# Patient Record
Sex: Female | Born: 1935 | Race: White | Hispanic: No | Marital: Married | Smoking: Never smoker
Health system: Southern US, Community
[De-identification: ages and names within clinical notes are randomized; demographics above are authoritative.]

## PROBLEM LIST (undated history)

## (undated) DIAGNOSIS — J189 Pneumonia, unspecified organism: Secondary | ICD-10-CM

## (undated) DIAGNOSIS — F419 Anxiety disorder, unspecified: Secondary | ICD-10-CM

## (undated) DIAGNOSIS — D649 Anemia, unspecified: Secondary | ICD-10-CM

## (undated) DIAGNOSIS — H3530 Unspecified macular degeneration: Secondary | ICD-10-CM

## (undated) DIAGNOSIS — Z2239 Carrier of other specified bacterial diseases: Secondary | ICD-10-CM

## (undated) DIAGNOSIS — R600 Localized edema: Secondary | ICD-10-CM

## (undated) DIAGNOSIS — H35039 Hypertensive retinopathy, unspecified eye: Secondary | ICD-10-CM

## (undated) DIAGNOSIS — Z9889 Other specified postprocedural states: Secondary | ICD-10-CM

## (undated) DIAGNOSIS — C801 Malignant (primary) neoplasm, unspecified: Secondary | ICD-10-CM

## (undated) DIAGNOSIS — M419 Scoliosis, unspecified: Secondary | ICD-10-CM

## (undated) DIAGNOSIS — I10 Essential (primary) hypertension: Secondary | ICD-10-CM

## (undated) DIAGNOSIS — M1990 Unspecified osteoarthritis, unspecified site: Secondary | ICD-10-CM

## (undated) DIAGNOSIS — R32 Unspecified urinary incontinence: Secondary | ICD-10-CM

## (undated) DIAGNOSIS — R112 Nausea with vomiting, unspecified: Secondary | ICD-10-CM

## (undated) HISTORY — PX: ABDOMINAL SURGERY: SHX537

## (undated) HISTORY — DX: Unspecified macular degeneration: H35.30

## (undated) HISTORY — DX: Hypertensive retinopathy, unspecified eye: H35.039

## (undated) HISTORY — PX: JOINT REPLACEMENT: SHX530

## (undated) HISTORY — PX: ABDOMINAL HYSTERECTOMY: SHX81

## (undated) HISTORY — PX: TONSILLECTOMY: SUR1361

## (undated) HISTORY — PX: EYE SURGERY: SHX253

## (undated) HISTORY — PX: CATARACT EXTRACTION: SUR2

## (undated) HISTORY — PX: BACK SURGERY: SHX140

## (undated) HISTORY — PX: TUBAL LIGATION: SHX77

---

## 2010-01-17 ENCOUNTER — Encounter
Admission: RE | Admit: 2010-01-17 | Discharge: 2010-01-17 | Payer: Self-pay | Source: Home / Self Care | Attending: Orthopedic Surgery | Admitting: Orthopedic Surgery

## 2010-03-16 ENCOUNTER — Ambulatory Visit (HOSPITAL_COMMUNITY)
Admission: RE | Admit: 2010-03-16 | Discharge: 2010-03-16 | Disposition: A | Discharge status: Home / Self Care | Payer: Medicare Other | Source: Ambulatory Visit | Attending: Orthopedic Surgery | Admitting: Orthopedic Surgery

## 2010-03-16 ENCOUNTER — Other Ambulatory Visit (HOSPITAL_COMMUNITY): Payer: Self-pay | Admitting: Orthopedic Surgery

## 2010-03-16 ENCOUNTER — Encounter (HOSPITAL_COMMUNITY)
Admission: RE | Admit: 2010-03-16 | Discharge: 2010-03-16 | Disposition: A | Discharge status: Home / Self Care | Payer: Medicare Other | Source: Ambulatory Visit | Attending: Orthopedic Surgery | Admitting: Orthopedic Surgery

## 2010-03-16 DIAGNOSIS — Z01818 Encounter for other preprocedural examination: Secondary | ICD-10-CM | POA: Insufficient documentation

## 2010-03-16 DIAGNOSIS — Z01810 Encounter for preprocedural cardiovascular examination: Secondary | ICD-10-CM | POA: Insufficient documentation

## 2010-03-16 DIAGNOSIS — Z01812 Encounter for preprocedural laboratory examination: Secondary | ICD-10-CM | POA: Insufficient documentation

## 2010-03-16 LAB — APTT: aPTT: 26 seconds (ref 24–37)

## 2010-03-16 LAB — CBC
Platelets: 223 10*3/uL (ref 150–400)
RBC: 4.2 MIL/uL (ref 3.87–5.11)
WBC: 9 10*3/uL (ref 4.0–10.5)

## 2010-03-16 LAB — URINALYSIS, ROUTINE W REFLEX MICROSCOPIC
Ketones, ur: NEGATIVE mg/dL
Nitrite: NEGATIVE
Protein, ur: NEGATIVE mg/dL

## 2010-03-16 LAB — DIFFERENTIAL
Basophils Absolute: 0 10*3/uL (ref 0.0–0.1)
Basophils Relative: 0 % (ref 0–1)
Eosinophils Absolute: 0.2 10*3/uL (ref 0.0–0.7)
Lymphs Abs: 3 10*3/uL (ref 0.7–4.0)
Neutrophils Relative %: 56 % (ref 43–77)

## 2010-03-16 LAB — PROTIME-INR
INR: 0.97 (ref 0.00–1.49)
Prothrombin Time: 13.1 seconds (ref 11.6–15.2)

## 2010-03-16 LAB — COMPREHENSIVE METABOLIC PANEL
Albumin: 4.1 g/dL (ref 3.5–5.2)
BUN: 20 mg/dL (ref 6–23)
Creatinine, Ser: 1.02 mg/dL (ref 0.4–1.2)
Glucose, Bld: 100 mg/dL — ABNORMAL HIGH (ref 70–99)
Total Bilirubin: 0.7 mg/dL (ref 0.3–1.2)
Total Protein: 6.7 g/dL (ref 6.0–8.3)

## 2010-03-16 LAB — SURGICAL PCR SCREEN: MRSA, PCR: NEGATIVE

## 2010-03-17 LAB — URINE CULTURE
Culture  Setup Time: 201203011205
Culture: NO GROWTH

## 2010-03-20 ENCOUNTER — Inpatient Hospital Stay (HOSPITAL_COMMUNITY)
Admission: RE | Admit: 2010-03-20 | Discharge: 2010-03-22 | DRG: 470 | Disposition: A | Discharge status: Home Health | Payer: Medicare Other | Source: Ambulatory Visit | Attending: Orthopedic Surgery | Admitting: Orthopedic Surgery

## 2010-03-20 DIAGNOSIS — M169 Osteoarthritis of hip, unspecified: Principal | ICD-10-CM | POA: Diagnosis present

## 2010-03-20 DIAGNOSIS — D62 Acute posthemorrhagic anemia: Secondary | ICD-10-CM | POA: Diagnosis not present

## 2010-03-20 DIAGNOSIS — I10 Essential (primary) hypertension: Secondary | ICD-10-CM | POA: Diagnosis present

## 2010-03-20 DIAGNOSIS — M1610 Unilateral primary osteoarthritis, unspecified hip: Principal | ICD-10-CM | POA: Diagnosis present

## 2010-03-20 LAB — CBC
HCT: 28.4 % — ABNORMAL LOW (ref 36.0–46.0)
MCHC: 34.5 g/dL (ref 30.0–36.0)
Platelets: 179 10*3/uL (ref 150–400)
RDW: 12.5 % (ref 11.5–15.5)
WBC: 12.1 10*3/uL — ABNORMAL HIGH (ref 4.0–10.5)

## 2010-03-21 LAB — BASIC METABOLIC PANEL
Calcium: 8.3 mg/dL — ABNORMAL LOW (ref 8.4–10.5)
GFR calc Af Amer: >60 mL/min (ref >60)
GFR calc non Af Amer: 56 mL/min — ABNORMAL LOW (ref >60)
Glucose, Bld: 138 mg/dL — ABNORMAL HIGH (ref 70–99)
Potassium: 3.7 mEq/L (ref 3.5–5.1)
Sodium: 136 mEq/L (ref 135–145)

## 2010-03-21 LAB — PROTIME-INR
INR: 1.16 (ref 0.00–1.49)
Prothrombin Time: 15 seconds (ref 11.6–15.2)

## 2010-03-21 LAB — CBC
Hemoglobin: 7.9 g/dL — ABNORMAL LOW (ref 12.0–15.0)
Platelets: 159 10*3/uL (ref 150–400)
RBC: 2.52 MIL/uL — ABNORMAL LOW (ref 3.87–5.11)
WBC: 7.8 10*3/uL (ref 4.0–10.5)

## 2010-03-22 LAB — CBC
HCT: 25.9 % — ABNORMAL LOW (ref 36.0–46.0)
Hemoglobin: 8.9 g/dL — ABNORMAL LOW (ref 12.0–15.0)
MCHC: 34.4 g/dL (ref 30.0–36.0)
WBC: 10.9 10*3/uL — ABNORMAL HIGH (ref 4.0–10.5)

## 2010-03-22 LAB — BASIC METABOLIC PANEL
CO2: 26 mEq/L (ref 19–32)
Calcium: 8 mg/dL — ABNORMAL LOW (ref 8.4–10.5)
GFR calc Af Amer: 58 mL/min — ABNORMAL LOW (ref >60)
GFR calc non Af Amer: 48 mL/min — ABNORMAL LOW (ref >60)
Glucose, Bld: 146 mg/dL — ABNORMAL HIGH (ref 70–99)
Potassium: 3.3 mEq/L — ABNORMAL LOW (ref 3.5–5.1)
Sodium: 136 mEq/L (ref 135–145)

## 2010-03-22 LAB — CROSSMATCH
ABO/RH(D): B POS
Antibody Screen: NEGATIVE
Unit division: 0
Unit division: 0

## 2010-03-22 LAB — PROTIME-INR: INR: 1.2 (ref 0.00–1.49)

## 2010-03-29 NOTE — Op Note (Signed)
  NAMESUANN, KLIER                ACCOUNT NO.:  [id number]  MEDICAL RECORD NO.:  [id number]           PATIENT TYPE:  I  LOCATION:  5002                         FACILITY:  [facility]  PHYSICIAN:  Mila Homer. Sherlean Foot, M.D. DATE OF BIRTH:  12-23-1934  DATE OF PROCEDURE:  03/20/2010 DATE OF DISCHARGE:  03/22/2010                              OPERATIVE REPORT   SURGEON:  Mila Homer. Sherlean Foot, MD  ASSISTANT:  Altamese Cabal, PA-C  PREOPERATIVE DIAGNOSIS:  Left hip osteoarthritis.  POSTOPERATIVE DIAGNOSIS:  Left hip osteoarthritis.  PROCEDURE:  Left total hip arthroplasty.  INDICATION FOR PROCEDURE:  The patient is a 76 year old white female with failure to conservative measures for osteoarthritis of the left hip.  Informed consent was obtained.  DESCRIPTION OF PROCEDURE:  The patient was laid supine, administered general anesthesia.  The left hip was prepped and draped in the usual sterile fashion.  A curvilinear incision was made over the greater trochanter approximately 6 inches in length.  I used the cautery to dissect down two and through the fascia lata using Charnley retractor and keep that in place.  I then incised the anterior one half of the gluteus medius, all the minimum and took that up anteriorly in a single sleeve and vastus lateralis, tagged with three stay sutures.  I then performed an anterior hip capsulectomy.  I then made my neck cut with reciprocating saw marking an out with the neck cutting guide.  I then removed the head and neck segment.  I placed a Hohmann retractor anteriorly and posteriorly and removed that circumferentially.  I then placed the leg in a sterile pouch, also went to the back side of the table and reamed up to 52 because of the bone was not very good and there was a lot of wires superiorly as head has been perched.  I excised with the use of 50-port trabecular metal cuff with two screws 20 and 25 mm.  I placed a standard liner and received 32 head,  went to the back side of the table.  I exposed the cut surface of the femoral neck and reamed up to 13, broached with a 13 trial with various head sizes and a +7 x 32 worked well.  I removed the trial components and copiously irrigated.  I then tamped on a fully porous coated size 13 stem and placed onto that to a 32 x 7+ head onto the [facility] taper, located the hip, took it through an aggressive range of motion, and it was very, very stable.  I then irrigated and closed the lateralis, medius, minimus sleeves through drill holes on the trochanter.  I closed the fascia lata with running #1 Vicryl, buried 0 Vicryl, subcuticular 2-0 Vicryl and skin staples.  Dressed with Xeroform and a Mepilex dressing.  COMPLICATIONS:  None.  DRAINS:  None.  EBL:  300 mL.          ______________________________ Mila Homer. Sherlean Foot, M.D.     SDL/MEDQ  D:  03/27/2010  T:  03/28/2010  Job:  [id number]  Electronically Signed by Georgena Spurling M.D. on 03/29/2010 11:29:13 AM

## 2011-06-21 ENCOUNTER — Other Ambulatory Visit: Payer: Self-pay | Admitting: Neurosurgery

## 2011-06-21 DIAGNOSIS — IMO0002 Reserved for concepts with insufficient information to code with codable children: Secondary | ICD-10-CM

## 2011-06-21 DIAGNOSIS — M546 Pain in thoracic spine: Secondary | ICD-10-CM

## 2011-06-21 DIAGNOSIS — M545 Low back pain: Secondary | ICD-10-CM

## 2011-06-21 DIAGNOSIS — Q762 Congenital spondylolisthesis: Secondary | ICD-10-CM

## 2011-06-27 ENCOUNTER — Ambulatory Visit
Admission: RE | Admit: 2011-06-27 | Discharge: 2011-06-27 | Disposition: A | Discharge status: Home / Self Care | Payer: Medicare Other | Source: Ambulatory Visit | Attending: Neurosurgery | Admitting: Neurosurgery

## 2011-06-27 DIAGNOSIS — M546 Pain in thoracic spine: Secondary | ICD-10-CM

## 2011-06-27 DIAGNOSIS — IMO0002 Reserved for concepts with insufficient information to code with codable children: Secondary | ICD-10-CM

## 2011-06-27 DIAGNOSIS — M545 Low back pain, unspecified: Secondary | ICD-10-CM

## 2011-06-27 DIAGNOSIS — Q762 Congenital spondylolisthesis: Secondary | ICD-10-CM

## 2011-07-06 ENCOUNTER — Other Ambulatory Visit: Payer: Self-pay | Admitting: Neurosurgery

## 2011-07-17 ENCOUNTER — Encounter (HOSPITAL_COMMUNITY): Payer: Self-pay | Admitting: Pharmacy Technician

## 2011-07-24 ENCOUNTER — Encounter (HOSPITAL_COMMUNITY)
Admission: RE | Admit: 2011-07-24 | Discharge: 2011-07-24 | Disposition: A | Discharge status: Home / Self Care | Payer: Medicare Other | Source: Ambulatory Visit | Attending: Neurosurgery | Admitting: Neurosurgery

## 2011-07-24 ENCOUNTER — Encounter (HOSPITAL_COMMUNITY): Payer: Self-pay

## 2011-07-24 HISTORY — DX: Essential (primary) hypertension: I10

## 2011-07-24 HISTORY — DX: Nausea with vomiting, unspecified: R11.2

## 2011-07-24 HISTORY — DX: Unspecified osteoarthritis, unspecified site: M19.90

## 2011-07-24 HISTORY — DX: Other specified postprocedural states: Z98.890

## 2011-07-24 LAB — BASIC METABOLIC PANEL
BUN: 24 mg/dL — ABNORMAL HIGH (ref 6–23)
Calcium: 10 mg/dL (ref 8.4–10.5)
Creatinine, Ser: 1.14 mg/dL — ABNORMAL HIGH (ref 0.50–1.10)
GFR calc Af Amer: 54 mL/min — ABNORMAL LOW (ref >90)
GFR calc non Af Amer: 46 mL/min — ABNORMAL LOW (ref >90)

## 2011-07-24 LAB — CBC
HCT: 39.7 % (ref 36.0–46.0)
MCHC: 33.2 g/dL (ref 30.0–36.0)
Platelets: 211 10*3/uL (ref 150–400)
RDW: 13.3 % (ref 11.5–15.5)
WBC: 8.2 10*3/uL (ref 4.0–10.5)

## 2011-07-24 LAB — TYPE AND SCREEN
ABO/RH(D): B POS
Antibody Screen: NEGATIVE

## 2011-07-24 NOTE — Pre-Procedure Instructions (Signed)
20 Regina Hall  07/24/2011   Your procedure is scheduled on:  Monday 07/30/11   Report to [facility] at 530 AM.  Call this number if you have problems the morning of surgery: [phone]   Remember:   Do not eat food OR DRINK :After Midnight.  Take these medicines the morning of surgery with A SIP OF WATER: HYDROCODONE,  POTASSIUM (STOP ASPIRIN, COUMADIN, PLAVIX, EFFIENT, HERBAL MEDICINES)   Do not wear jewelry, make-up or nail polish.  Do not wear lotions, powders, or perfumes. You may wear deodorant.  Do not shave 48 hours prior to surgery. Men may shave face and neck.  Do not bring valuables to the hospital.  Contacts, dentures or bridgework may not be worn into surgery.  Leave suitcase in the car. After surgery it may be brought to your room.  For patients admitted to the hospital, checkout time is 11:00 AM the day of discharge.   Patients discharged the day of surgery will not be allowed to drive home.  Name and phone number of your driver:   Special Instructions: CHG Shower Use Special Wash: 1/2 bottle night before surgery and 1/2 bottle morning of surgery.   Please read over the following fact sheets that you were given: Pain Booklet, Coughing and Deep Breathing, Blood Transfusion Information, MRSA Information and Surgical Site Infection Prevention

## 2011-07-30 ENCOUNTER — Inpatient Hospital Stay (HOSPITAL_COMMUNITY)
Admission: RE | Admit: 2011-07-30 | Discharge: 2011-07-31 | DRG: 491 | Disposition: A | Discharge status: Home / Self Care | Payer: Medicare Other | Source: Ambulatory Visit | Attending: Neurosurgery | Admitting: Neurosurgery

## 2011-07-30 ENCOUNTER — Inpatient Hospital Stay (HOSPITAL_COMMUNITY): Payer: Medicare Other | Admitting: Anesthesiology

## 2011-07-30 ENCOUNTER — Encounter (HOSPITAL_COMMUNITY): Payer: Self-pay | Admitting: *Deleted

## 2011-07-30 ENCOUNTER — Inpatient Hospital Stay (HOSPITAL_COMMUNITY): Payer: Medicare Other

## 2011-07-30 ENCOUNTER — Encounter (HOSPITAL_COMMUNITY)
Admission: RE | Disposition: A | Discharge status: Home / Self Care | Payer: Self-pay | Source: Ambulatory Visit | Attending: Neurosurgery

## 2011-07-30 ENCOUNTER — Encounter (HOSPITAL_COMMUNITY): Payer: Self-pay | Admitting: Anesthesiology

## 2011-07-30 DIAGNOSIS — M5106 Intervertebral disc disorders with myelopathy, lumbar region: Principal | ICD-10-CM | POA: Diagnosis present

## 2011-07-30 DIAGNOSIS — M4310 Spondylolisthesis, site unspecified: Secondary | ICD-10-CM | POA: Diagnosis present

## 2011-07-30 DIAGNOSIS — M4120 Other idiopathic scoliosis, site unspecified: Secondary | ICD-10-CM | POA: Diagnosis present

## 2011-07-30 DIAGNOSIS — I10 Essential (primary) hypertension: Secondary | ICD-10-CM | POA: Diagnosis present

## 2011-07-30 SURGERY — DECOMPRESSIVE LUMBAR LAMINECTOMY LEVEL 1
Anesthesia: General | Site: Back | Wound class: Clean

## 2011-07-30 MED ORDER — LIDOCAINE HCL (CARDIAC) 20 MG/ML IV SOLN
INTRAVENOUS | Status: DC | PRN
  Administered 2011-07-30: 80 mg via INTRAVENOUS

## 2011-07-30 MED ORDER — PROMETHAZINE HCL 25 MG PO TABS: 12.5000 mg | ORAL_TABLET | ORAL | Status: DC | PRN

## 2011-07-30 MED ORDER — MORPHINE SULFATE 2 MG/ML IJ SOLN: 1.0000 mg | INTRAMUSCULAR | Status: DC | PRN

## 2011-07-30 MED ORDER — SODIUM CHLORIDE 0.9 % IJ SOLN
3.0000 mL | Freq: Two times a day (BID) | INTRAMUSCULAR | Status: DC
  Administered 2011-07-30 (×2): 3 mL via INTRAVENOUS

## 2011-07-30 MED ORDER — DOCUSATE SODIUM 100 MG PO CAPS
100.0000 mg | ORAL_CAPSULE | Freq: Two times a day (BID) | ORAL | Status: DC
  Administered 2011-07-30 (×2): 100 mg via ORAL
  Filled 2011-07-30 (×2): qty 1

## 2011-07-30 MED ORDER — ONDANSETRON HCL 4 MG/2ML IJ SOLN: 4.0000 mg | Freq: Four times a day (QID) | INTRAMUSCULAR | Status: DC | PRN

## 2011-07-30 MED ORDER — BACITRACIN 50000 UNITS IM SOLR
INTRAMUSCULAR | Status: AC
  Filled 2011-07-30: qty 1

## 2011-07-30 MED ORDER — METHOCARBAMOL 100 MG/ML IJ SOLN
500.0000 mg | Freq: Four times a day (QID) | INTRAVENOUS | Status: DC | PRN
  Filled 2011-07-30: qty 5

## 2011-07-30 MED ORDER — MAGNESIUM HYDROXIDE 400 MG/5ML PO SUSP: 30.0000 mL | Freq: Every day | ORAL | Status: DC | PRN

## 2011-07-30 MED ORDER — ACETAMINOPHEN 650 MG RE SUPP: 650.0000 mg | RECTAL | Status: DC | PRN

## 2011-07-30 MED ORDER — HYDROMORPHONE HCL PF 1 MG/ML IJ SOLN: 0.2500 mg | INTRAMUSCULAR | Status: DC | PRN

## 2011-07-30 MED ORDER — VITAMIN B-12 1000 MCG PO TABS
1000.0000 ug | ORAL_TABLET | Freq: Every day | ORAL | Status: DC
  Administered 2011-07-30: 1000 ug via ORAL
  Filled 2011-07-30 (×2): qty 1

## 2011-07-30 MED ORDER — POTASSIUM GLUCONATE 595 MG PO CAPS: 1.0000 | ORAL_CAPSULE | Freq: Every morning | ORAL | Status: DC

## 2011-07-30 MED ORDER — PROMETHAZINE HCL 25 MG/ML IJ SOLN: 12.5000 mg | INTRAMUSCULAR | Status: DC | PRN

## 2011-07-30 MED ORDER — OXYCODONE-ACETAMINOPHEN 5-325 MG PO TABS: 1.0000 | ORAL_TABLET | ORAL | Status: DC | PRN

## 2011-07-30 MED ORDER — LIDOCAINE-EPINEPHRINE 1 %-1:100000 IJ SOLN
INTRAMUSCULAR | Status: DC | PRN
  Administered 2011-07-30: 20 mL

## 2011-07-30 MED ORDER — VANCOMYCIN HCL IN DEXTROSE 1-5 GM/200ML-% IV SOLN: 1000.0000 mg | Freq: Once | INTRAVENOUS | Status: DC

## 2011-07-30 MED ORDER — FENTANYL CITRATE 0.05 MG/ML IJ SOLN
INTRAMUSCULAR | Status: DC | PRN
  Administered 2011-07-30: 50 ug via INTRAVENOUS
  Administered 2011-07-30: 100 ug via INTRAVENOUS

## 2011-07-30 MED ORDER — PROPOFOL 10 MG/ML IV EMUL
INTRAVENOUS | Status: DC | PRN
  Administered 2011-07-30: 160 mg via INTRAVENOUS

## 2011-07-30 MED ORDER — KETOROLAC TROMETHAMINE 30 MG/ML IJ SOLN
30.0000 mg | Freq: Four times a day (QID) | INTRAMUSCULAR | Status: DC
  Administered 2011-07-30 – 2011-07-31 (×3): 30 mg via INTRAVENOUS
  Filled 2011-07-30 (×4): qty 1

## 2011-07-30 MED ORDER — TRAMADOL HCL 50 MG PO TABS
50.0000 mg | ORAL_TABLET | Freq: Four times a day (QID) | ORAL | Status: DC | PRN
  Filled 2011-07-30: qty 1

## 2011-07-30 MED ORDER — VECURONIUM BROMIDE 10 MG IV SOLR
INTRAVENOUS | Status: DC | PRN
  Administered 2011-07-30: 5 mg via INTRAVENOUS

## 2011-07-30 MED ORDER — LACTATED RINGERS IV SOLN: INTRAVENOUS | Status: DC

## 2011-07-30 MED ORDER — ONDANSETRON HCL 4 MG/2ML IJ SOLN: 4.0000 mg | INTRAMUSCULAR | Status: DC | PRN

## 2011-07-30 MED ORDER — TRIAMTERENE-HCTZ 37.5-25 MG PO TABS
1.0000 | ORAL_TABLET | Freq: Every day | ORAL | Status: DC
  Administered 2011-07-30: 1 via ORAL
  Filled 2011-07-30 (×2): qty 1

## 2011-07-30 MED ORDER — NEOSTIGMINE METHYLSULFATE 1 MG/ML IJ SOLN
INTRAMUSCULAR | Status: DC | PRN
  Administered 2011-07-30: 5 mg via INTRAVENOUS

## 2011-07-30 MED ORDER — GLYCOPYRROLATE 0.2 MG/ML IJ SOLN
INTRAMUSCULAR | Status: DC | PRN
  Administered 2011-07-30: .8 mg via INTRAVENOUS

## 2011-07-30 MED ORDER — ONDANSETRON HCL 4 MG/2ML IJ SOLN
INTRAMUSCULAR | Status: DC | PRN
  Administered 2011-07-30: 4 mg via INTRAVENOUS

## 2011-07-30 MED ORDER — FOLIC ACID 1 MG PO TABS
1.0000 mg | ORAL_TABLET | Freq: Every day | ORAL | Status: DC
  Administered 2011-07-30: 1 mg via ORAL
  Filled 2011-07-30 (×2): qty 1

## 2011-07-30 MED ORDER — ACETAMINOPHEN 325 MG PO TABS: 650.0000 mg | ORAL_TABLET | ORAL | Status: DC | PRN

## 2011-07-30 MED ORDER — OXYBUTYNIN CHLORIDE 5 MG PO TABS
5.0000 mg | ORAL_TABLET | ORAL | Status: DC
  Administered 2011-07-30: 5 mg via ORAL
  Filled 2011-07-30 (×2): qty 1

## 2011-07-30 MED ORDER — FOLIC ACID 400 MCG PO TABS: 400.0000 ug | ORAL_TABLET | Freq: Every day | ORAL | Status: DC

## 2011-07-30 MED ORDER — HEMOSTATIC AGENTS (NO CHARGE) OPTIME
TOPICAL | Status: DC | PRN
  Administered 2011-07-30: 1 via TOPICAL

## 2011-07-30 MED ORDER — VANCOMYCIN HCL IN DEXTROSE 1-5 GM/200ML-% IV SOLN
INTRAVENOUS | Status: AC
  Administered 2011-07-30: 1000 mg via INTRAVENOUS
  Filled 2011-07-30: qty 200

## 2011-07-30 MED ORDER — 0.9 % SODIUM CHLORIDE (POUR BTL) OPTIME
TOPICAL | Status: DC | PRN
  Administered 2011-07-30: 1000 mL

## 2011-07-30 MED ORDER — CALCIUM CARBONATE 1250 (500 CA) MG PO TABS
1.0000 | ORAL_TABLET | Freq: Every day | ORAL | Status: DC
  Administered 2011-07-30: 500 mg via ORAL
  Filled 2011-07-30 (×2): qty 1

## 2011-07-30 MED ORDER — SODIUM CHLORIDE 0.9 % IR SOLN
Status: DC | PRN
  Administered 2011-07-30: 08:00:00

## 2011-07-30 MED ORDER — HYDROCODONE-ACETAMINOPHEN 5-325 MG PO TABS
1.0000 | ORAL_TABLET | ORAL | Status: DC | PRN
  Administered 2011-07-30 – 2011-07-31 (×2): 1 via ORAL
  Filled 2011-07-30 (×3): qty 1

## 2011-07-30 MED ORDER — ZOLPIDEM TARTRATE 5 MG PO TABS: 5.0000 mg | ORAL_TABLET | Freq: Every evening | ORAL | Status: DC | PRN

## 2011-07-30 MED ORDER — SODIUM CHLORIDE 0.9 % IV SOLN
INTRAVENOUS | Status: AC
  Filled 2011-07-30: qty 500

## 2011-07-30 MED ORDER — TRIAMTERENE-HCTZ 37.5-25 MG PO CAPS
1.0000 | ORAL_CAPSULE | Freq: Every day | ORAL | Status: DC
  Filled 2011-07-30: qty 1

## 2011-07-30 MED ORDER — METHOCARBAMOL 500 MG PO TABS
500.0000 mg | ORAL_TABLET | Freq: Four times a day (QID) | ORAL | Status: DC | PRN
  Administered 2011-07-30 – 2011-07-31 (×2): 500 mg via ORAL
  Filled 2011-07-30 (×2): qty 1

## 2011-07-30 MED ORDER — ROCURONIUM BROMIDE 100 MG/10ML IV SOLN
INTRAVENOUS | Status: DC | PRN
  Administered 2011-07-30: 50 mg via INTRAVENOUS

## 2011-07-30 MED ORDER — CYCLOBENZAPRINE HCL 10 MG PO TABS: 10.0000 mg | ORAL_TABLET | Freq: Three times a day (TID) | ORAL | Status: DC | PRN

## 2011-07-30 MED ORDER — THROMBIN 20000 UNITS EX KIT
PACK | CUTANEOUS | Status: DC | PRN
  Administered 2011-07-30: 20000 [IU] via TOPICAL

## 2011-07-30 MED ORDER — ACETAMINOPHEN 10 MG/ML IV SOLN
INTRAVENOUS | Status: AC
  Administered 2011-07-30: 1000 mg via INTRAVENOUS
  Filled 2011-07-30: qty 100

## 2011-07-30 MED ORDER — SODIUM CHLORIDE 0.9 % IJ SOLN: 3.0000 mL | INTRAMUSCULAR | Status: DC | PRN

## 2011-07-30 MED ORDER — VANCOMYCIN HCL IN DEXTROSE 1-5 GM/200ML-% IV SOLN
1000.0000 mg | Freq: Once | INTRAVENOUS | Status: AC
  Administered 2011-07-30: 1000 mg via INTRAVENOUS
  Filled 2011-07-30: qty 200

## 2011-07-30 MED ORDER — LACTATED RINGERS IV SOLN
INTRAVENOUS | Status: DC | PRN
  Administered 2011-07-30 (×2): via INTRAVENOUS

## 2011-07-30 MED ORDER — POTASSIUM GLUCONATE 595 (99 K) MG PO TABS: 595.0000 mg | ORAL_TABLET | Freq: Every day | ORAL | Status: DC

## 2011-07-30 MED ORDER — BISACODYL 10 MG RE SUPP: 10.0000 mg | Freq: Every day | RECTAL | Status: DC | PRN

## 2011-07-30 SURGICAL SUPPLY — 53 items
BAG DECANTER FOR FLEXI CONT (MISCELLANEOUS) ×3 IMPLANT
BENZOIN TINCTURE PRP APPL 2/3 (GAUZE/BANDAGES/DRESSINGS) ×3 IMPLANT
BLADE SURG ROTATE 9660 (MISCELLANEOUS) IMPLANT
BUR PRECISION FLUTE 5.0 (BURR) ×3 IMPLANT
CANISTER SUCTION 2500CC (MISCELLANEOUS) ×3 IMPLANT
CLOTH BEACON ORANGE TIMEOUT ST (SAFETY) ×3 IMPLANT
CONT SPEC 4OZ CLIKSEAL STRL BL (MISCELLANEOUS) ×6 IMPLANT
COVER BACK TABLE 24X17X13 BIG (DRAPES) ×3 IMPLANT
COVER TABLE BACK 60X90 (DRAPES) ×3 IMPLANT
DECANTER SPIKE VIAL GLASS SM (MISCELLANEOUS) ×3 IMPLANT
DERMABOND ADVANCED (GAUZE/BANDAGES/DRESSINGS) ×1
DERMABOND ADVANCED .7 DNX12 (GAUZE/BANDAGES/DRESSINGS) ×2 IMPLANT
DRAPE C-ARM 42X72 X-RAY (DRAPES) ×6 IMPLANT
DRAPE LAPAROTOMY 100X72X124 (DRAPES) ×3 IMPLANT
DRAPE MICROSCOPE LEICA (MISCELLANEOUS) ×3 IMPLANT
DRAPE POUCH INSTRU U-SHP 10X18 (DRAPES) ×3 IMPLANT
DRAPE PROXIMA HALF (DRAPES) IMPLANT
DRESSING TELFA 8X3 (GAUZE/BANDAGES/DRESSINGS) ×3 IMPLANT
DURAPREP 26ML APPLICATOR (WOUND CARE) ×3 IMPLANT
ELECT REM PT RETURN 9FT ADLT (ELECTROSURGICAL) ×3
ELECTRODE REM PT RTRN 9FT ADLT (ELECTROSURGICAL) ×2 IMPLANT
GAUZE SPONGE 4X4 16PLY XRAY LF (GAUZE/BANDAGES/DRESSINGS) IMPLANT
GLOVE ECLIPSE 7.5 STRL STRAW (GLOVE) ×6 IMPLANT
GLOVE EXAM NITRILE LRG STRL (GLOVE) IMPLANT
GLOVE EXAM NITRILE MD LF STRL (GLOVE) IMPLANT
GLOVE EXAM NITRILE XL STR (GLOVE) IMPLANT
GLOVE EXAM NITRILE XS STR PU (GLOVE) IMPLANT
GOWN BRE IMP SLV AUR LG STRL (GOWN DISPOSABLE) IMPLANT
GOWN BRE IMP SLV AUR XL STRL (GOWN DISPOSABLE) IMPLANT
GOWN STRL REIN 2XL LVL4 (GOWN DISPOSABLE) ×6 IMPLANT
KIT BASIN OR (CUSTOM PROCEDURE TRAY) ×3 IMPLANT
KIT ROOM TURNOVER OR (KITS) ×3 IMPLANT
NEEDLE HYPO 22GX1.5 SAFETY (NEEDLE) ×3 IMPLANT
NS IRRIG 1000ML POUR BTL (IV SOLUTION) ×3 IMPLANT
PACK LAMINECTOMY NEURO (CUSTOM PROCEDURE TRAY) ×3 IMPLANT
PAD ARMBOARD 7.5X6 YLW CONV (MISCELLANEOUS) ×9 IMPLANT
PATTIES SURGICAL .75X.75 (GAUZE/BANDAGES/DRESSINGS) ×3 IMPLANT
RUBBERBAND STERILE (MISCELLANEOUS) ×6 IMPLANT
SPONGE GAUZE 4X4 12PLY (GAUZE/BANDAGES/DRESSINGS) ×3 IMPLANT
SPONGE LAP 4X18 X RAY DECT (DISPOSABLE) IMPLANT
SPONGE SURGIFOAM ABS GEL 100 (HEMOSTASIS) ×3 IMPLANT
STRIP CLOSURE SKIN 1/2X4 (GAUZE/BANDAGES/DRESSINGS) ×3 IMPLANT
SUT VIC AB 0 CT1 18XCR BRD8 (SUTURE) ×4 IMPLANT
SUT VIC AB 0 CT1 8-18 (SUTURE) ×2
SUT VIC AB 2-0 CP2 18 (SUTURE) ×6 IMPLANT
SUT VIC AB 3-0 SH 8-18 (SUTURE) ×6 IMPLANT
SYR 20ML ECCENTRIC (SYRINGE) ×3 IMPLANT
TAPE CLOTH SURG 4X10 WHT LF (GAUZE/BANDAGES/DRESSINGS) ×3 IMPLANT
TOWEL OR 17X24 6PK STRL BLUE (TOWEL DISPOSABLE) ×3 IMPLANT
TOWEL OR 17X26 10 PK STRL BLUE (TOWEL DISPOSABLE) ×3 IMPLANT
TRAP SPECIMEN MUCOUS 40CC (MISCELLANEOUS) IMPLANT
TRAY FOLEY CATH 14FRSI W/METER (CATHETERS) ×3 IMPLANT
WATER STERILE IRR 1000ML POUR (IV SOLUTION) ×3 IMPLANT

## 2011-07-30 NOTE — Anesthesia Procedure Notes (Signed)
Procedure Name: Intubation Date/Time: 07/30/2011 7:47 AM Performed by: Arlice Colt B Pre-anesthesia Checklist: Patient identified, Emergency Drugs available, Suction available, Patient being monitored and Timeout performed Patient Re-evaluated:Patient Re-evaluated prior to inductionOxygen Delivery Method: Circle system utilized Preoxygenation: Pre-oxygenation with 100% oxygen Intubation Type: IV induction Ventilation: Mask ventilation without difficulty Laryngoscope Size: Mac and 4 Grade View: Grade I Tube type: Oral Tube size: 7.5 mm Number of attempts: 1 Airway Equipment and Method: Stylet Placement Confirmation: ETT inserted through vocal cords under direct vision,  positive ETCO2 and breath sounds checked- equal and bilateral Secured at: 23 cm Tube secured with: Tape Dental Injury: Teeth and Oropharynx as per pre-operative assessment

## 2011-07-30 NOTE — Op Note (Signed)
07/30/2011  9:51 AM  PATIENT:  Regina Hall  77 y.o. female  PRE-OPERATIVE DIAGNOSIS:  Scoliosis, Spondylolisthesis, Lumbar stenosis, Lumbar hnp with myelopathy, radiculopathy  POST-OPERATIVE DIAGNOSIS:    Scoliosis, Spondylolisthesis, Lumbar stenosis, Lumbar hnp with myelopathy, radiculopathy  PROCEDURE:  Procedure(s): Left DECOMPRESSIVE LUMBAR LAMINECTOMY decompressing the L4 and L5 roots (2L) , microdisection   SURGEON:  Surgeon(s): Clydene Fake, MD Temple Pacini, MD-ASSISTANT    ANESTHESIA:   general  EBL:  Total I/O In: 1000 [I.V.:1000] Out: 225 [Urine:200; Blood:25]  BLOOD ADMINISTERED:none  DRAINS: none   SPECIMEN:  No Specimen  DICTATION: Patient with back left hip and leg pain trouble walking but progressively getting worse and has improved briefly with epidural injections but has not lasted after our workup with x-rays and MRI patient brought in for decompressed lamina and diffusion though preoperatively her evaluation films again the stenosis was severe left side 45 slight anterolisthesis but no clear instability and decreased significant scoliosis just above that level was decided not to proceed with a fusion operation depending on intraoperative findings were 0.2 most likely just a decompressive lumbar laminectomy left side at the L4-5 level decompress this to nerve roots.  Patient brought in from general she induced patient placed in prone position Wilson frame all pressure points padded. Patient prepped draped sterile fashion 7 incision injected with 10 cc 1% lidocaine with epinephrineinterspace x-rays obtained showing the needle was putting at the L3-4 level incision was then made centered just below with the needle was incision taken out of the fascia. Fascia incised subtenon suppressed dissection done over the L4 and 5 spinous process lamina to the facet switching retractors placed markers placed interspace x-rays obtained is of poor quality and needed further  x-rays but only got a good x-ray showing that the marker was putting at the L4-5 level. Prescriptions for microdissection high-speed drill history semi-hemilaminectomy medial facetectomy were significant facet hypertrophy and some we drilled in discomfort facet was followed on the into the disc space ligament was very thickened-year-old tumor continue the laminectomy Kerrison punches and remove the very and ligament synovitis was slightly attached dura the we are with a pillow without too much trouble and in we decompressed at the C4 and L5 nerve roots. There seems to the correct ligament of the upper canal brush the 4 and with curettes and hooks and Kerrison punches were decompressed at. We decompressed the Pfeifer to double the pedicle and followed out sooner finished and the central canal was well decompressed to the dura the back out laterally into the lateral recesses overdrilled decompress both L4 and 5 roots. We urine about solution hemostasis with Gelfoam thrombin was irrigated with the for hemostasis the retractors removed fascia closed with 0 Vicryl interrupted sutures excess tissue closed through Vicryl interrupted sutures skin closed benzoin Steri-Strips dressing was placed patient placed back in the spinal position woken (and transferred recovery dictation  PLAN OF CARE: Admit to inpatient   PATIENT DISPOSITION:  PACU - hemodynamically stable.

## 2011-07-30 NOTE — H&P (Signed)
See H& P.

## 2011-07-30 NOTE — Anesthesia Postprocedure Evaluation (Signed)
Anesthesia Post Note  Patient: Regina Hall  Procedure(s) Performed: Procedure(s) (LRB): DECOMPRESSIVE LUMBAR LAMINECTOMY LEVEL 1 ()  Anesthesia type: General  Patient location: PACU  Post pain: Pain level controlled and Adequate analgesia  Post assessment: Post-op Vital signs reviewed, Patient's Cardiovascular Status Stable, Respiratory Function Stable, Patent Airway and Pain level controlled  Last Vitals:  Filed Vitals:   07/30/11 1001  BP:   Pulse:   Temp: 36.3 C  Resp:     Post vital signs: Reviewed and stable  Level of consciousness: awake, alert  and oriented  Complications: No apparent anesthesia complications

## 2011-07-30 NOTE — Progress Notes (Signed)
ANTIBIOTIC CONSULT NOTE - INITIAL  Pharmacy Consult for Vancomycin Indication: post-operative prophylaxis  Allergies  Allergen Reactions  . Penicillins Rash    Patient Measurements:    Wt Readings from Last 3 Encounters:  07/30/11 184 lb (83.462 kg)  07/24/11 84 lb 7 oz (38.301 kg)     Vital Signs: Temp: 97 F (36.1 C) (07/15 1030) Temp src: Oral (07/15 0632) BP: 116/60 mmHg (07/15 1030) Pulse Rate: 71  (07/15 1019) Intake/Output from previous day:   Intake/Output from this shift: Total I/O In: 1400 [I.V.:1400] Out: 275 [Urine:250; Blood:25]  Labs: SCr 1.14, Est CrCl ~50 ml/min No results found for this basename: WBC:3,HGB:3,PLT:3,LABCREA:3,CREATININE:3 in the last 72 hours CrCl is unknown because there is no height on file for the current visit. No results found for this basename: VANCOTROUGH:2,VANCOPEAK:2,VANCORANDOM:2,GENTTROUGH:2,GENTPEAK:2,GENTRANDOM:2,TOBRATROUGH:2,TOBRAPEAK:2,TOBRARND:2,AMIKACINPEAK:2,AMIKACINTROU:2,AMIKACIN:2, in the last 72 hours   Microbiology: Recent Results (from the past 720 hour(s))  SURGICAL PCR SCREEN     Status: Normal   Collection Time   07/24/11  1:27 PM      Component Value Range Status Comment   MRSA, PCR NEGATIVE  NEGATIVE Final    Staphylococcus aureus NEGATIVE  NEGATIVE Final     Medical History: Past Medical History  Diagnosis Date  . PONV (postoperative nausea and vomiting)   . Hypertension   . Arthritis     Medications:  Anti-infectives     Start     Dose/Rate Route Frequency Ordered Stop   07/30/11 0741   bacitracin 50,000 Units in sodium chloride irrigation 0.9 % 500 mL irrigation  Status:  Discontinued          As needed 07/30/11 0741 07/30/11 0959   07/30/11 0724   bacitracin 40981 UNITS injection     Comments: Trellis Paganini: cabinet override         07/30/11 0724 07/30/11 1929   07/30/11 0630   vancomycin (VANCOCIN) IVPB 1000 mg/200 mL premix  Status:  Discontinued        1,000 mg 200 mL/hr over 60  Minutes Intravenous  Once 07/30/11 0624 07/30/11 1041   07/30/11 0629   vancomycin (VANCOCIN) 1 GM/200ML IVPB     Comments: SAVAGE, Deija Buhrman: cabinet override         07/30/11 0629 07/30/11 0750         Assessment: S/p spinal surgery:  To receive 1 dose of Vancomycin as post-operative prophylaxis.  Goal of Therapy:  Vancomycin trough level 10-15 mcg/ml  Plan: Vancomycin 1gm IV x 1 at 2000. As no additional adjustments are required pharmacy will sign off.  Estella Husk, Pharm.D., BCPS Clinical Pharmacist  Phone [phone] Pager [phone] 07/30/2011, 11:18 AM

## 2011-07-30 NOTE — Plan of Care (Signed)
Problem: Consults Goal: Diagnosis - Spinal Surgery Outcome: Completed/Met Date Met:  07/30/11 Lumbar Laminectomy (Complex)     

## 2011-07-30 NOTE — Interval H&P Note (Signed)
History and Physical Interval Note:  07/30/2011 7:37 AM  Regina Hall  has presented today for surgery, with the diagnosis of Scoliosis, Spondylolisthesis, Lumbar stenosis, Lumbar hnp with myelopathy  The various methods of treatment have been discussed with the patient and family. After consideration of risks, benefits and other options for treatment, the patient has consented to  Procedure(s) (LRB): POSTERIOR LUMBAR FUSION 1 LEVEL (N/A) as a surgical intervention .  The patient's history has been reviewed, patient examined, no change in status, stable for surgery.  I have reviewed the patients' chart and labs.  Questions were answered to the patient's satisfaction.     [name] R

## 2011-07-30 NOTE — Transfer of Care (Signed)
Immediate Anesthesia Transfer of Care Note  Patient: Regina Hall  Procedure(s) Performed: Procedure(s) (LRB): DECOMPRESSIVE LUMBAR LAMINECTOMY LEVEL 1 ()  Patient Location: PACU  Anesthesia Type: General  Level of Consciousness: awake, alert , oriented and patient cooperative  Airway & Oxygen Therapy: Patient Spontanous Breathing and Patient connected to nasal cannula oxygen  Post-op Assessment: Report given to PACU RN, Post -op Vital signs reviewed and stable and Patient moving all extremities  Post vital signs: Reviewed and stable  Complications: No apparent anesthesia complications

## 2011-07-30 NOTE — Anesthesia Preprocedure Evaluation (Addendum)
Anesthesia Evaluation  Patient identified by MRN, date of birth, ID band Patient awake    Reviewed: Allergy & Precautions, H&P , NPO status , Patient's Chart, lab work & pertinent test results  History of Anesthesia Complications (+) PONV  Airway Mallampati: II  Neck ROM: full    Dental  (+) Teeth Intact   Pulmonary          Cardiovascular hypertension, Pt. on medications     Neuro/Psych    GI/Hepatic   Endo/Other    Renal/GU      Musculoskeletal  (+) Arthritis -,   Abdominal   Peds  Hematology   Anesthesia Other Findings   Reproductive/Obstetrics                          Anesthesia Physical Anesthesia Plan  ASA: II  Anesthesia Plan: General   Post-op Pain Management:    Induction: Intravenous  Airway Management Planned: Oral ETT  Additional Equipment:   Intra-op Plan:   Post-operative Plan: Extubation in OR  Informed Consent: I have reviewed the patients History and Physical, chart, labs and discussed the procedure including the risks, benefits and alternatives for the proposed anesthesia with the patient or authorized representative who has indicated his/her understanding and acceptance.     Plan Discussed with: CRNA and Surgeon  Anesthesia Plan Comments:         Anesthesia Quick Evaluation

## 2011-07-31 MED ORDER — HYDROCODONE-ACETAMINOPHEN 5-325 MG PO TABS: 1.0000 | ORAL_TABLET | ORAL | Status: AC | PRN

## 2011-07-31 MED ORDER — CYCLOBENZAPRINE HCL 10 MG PO TABS: 10.0000 mg | ORAL_TABLET | Freq: Three times a day (TID) | ORAL | Status: AC | PRN

## 2011-07-31 MED FILL — Heparin Sodium (Porcine) Inj 1000 Unit/ML: INTRAMUSCULAR | Qty: 30 | Status: AC

## 2011-07-31 MED FILL — Sodium Chloride IV Soln 0.9%: INTRAVENOUS | Qty: 1000 | Status: AC

## 2011-07-31 NOTE — Progress Notes (Signed)
UR COMPLETED  

## 2011-07-31 NOTE — Discharge Summary (Signed)
Physician Discharge Summary  Patient ID: Regina Hall MRN: [id number] DOB/AGE: 06/28/1934 77 y.o.  Admit date: 07/30/2011 Discharge date: 07/31/2011  Admission Diagnoses:Scoliosis, Spondylolisthesis, Lumbar stenosis, Lumbar hnp with myelopathy, radiculopathy   Discharge Diagnoses: Scoliosis, Spondylolisthesis, Lumbar stenosis, Lumbar hnp with myelopathy, radiculopathy  Active Problems:  * No active hospital problems. *    Discharged Condition: good  Hospital Course: pt admitted day of surgery - underwent procedure below - pt with less leg pain, ambulating - doing well  Consults: None  Significant Diagnostic Studies: none  Treatments: surgery: Left DECOMPRESSIVE LUMBAR LAMINECTOMY decompressing the L4 and L5 roots (2L) , microdisection    Discharge Exam: Blood pressure 117/82, pulse 68, temperature 99.4 F (37.4 C), temperature source Oral, resp. rate 18, SpO2 94.00%. Wound:c/d/i  Disposition: home   Medication List  As of 07/31/2011  7:40 AM   TAKE these medications         calcium carbonate 1250 MG tablet   Commonly known as: OS-CAL - dosed in mg of elemental calcium   Take 1 tablet by mouth daily.      cyclobenzaprine 10 MG tablet   Commonly known as: FLEXERIL   Take 1 tablet (10 mg total) by mouth 3 (three) times daily as needed for muscle spasms.      folic acid 400 MCG tablet   Commonly known as: FOLVITE   Take 400 mcg by mouth daily.      GLUCOSAMINE FORTE PO   Take 2 tablets by mouth daily.      HYDROcodone-acetaminophen 5-325 MG per tablet   Commonly known as: NORCO/VICODIN   Take 1-2 tablets by mouth every 4 (four) hours as needed.      HYDROcodone-acetaminophen 10-650 MG per tablet   Commonly known as: LORCET   Take 0.5-1 tablets by mouth every 6 (six) hours as needed. For pain.      Magnesium 500 MG Tabs   Take 1 tablet by mouth daily.      oxybutynin 5 MG tablet   Commonly known as: DITROPAN   Take 5 mg by mouth 1 day or 1 dose.     potassium gluconate 595 MG Tabs   Take 595 mg by mouth daily.      triamterene-hydrochlorothiazide 37.5-25 MG per capsule   Commonly known as: DYAZIDE   Take 1 capsule by mouth daily.      vitamin B-12 1000 MCG tablet   Commonly known as: CYANOCOBALAMIN   Take 1,000 mcg by mouth daily.             SignedClydene Fake, MD 07/31/2011, 7:40 AM

## 2012-09-09 DIAGNOSIS — Z96649 Presence of unspecified artificial hip joint: Secondary | ICD-10-CM | POA: Insufficient documentation

## 2012-10-09 ENCOUNTER — Other Ambulatory Visit: Payer: Self-pay | Admitting: *Deleted

## 2012-10-09 DIAGNOSIS — M419 Scoliosis, unspecified: Secondary | ICD-10-CM

## 2012-10-21 ENCOUNTER — Other Ambulatory Visit: Payer: Medicare Other

## 2012-10-21 ENCOUNTER — Inpatient Hospital Stay
Admission: RE | Admit: 2012-10-21 | Discharge: 2012-10-21 | Disposition: A | Discharge status: Home / Self Care | Payer: Medicare Other | Source: Ambulatory Visit | Attending: *Deleted | Admitting: *Deleted

## 2012-10-28 ENCOUNTER — Ambulatory Visit
Admission: RE | Admit: 2012-10-28 | Discharge: 2012-10-28 | Disposition: A | Discharge status: Home / Self Care | Payer: Medicare Other | Source: Ambulatory Visit | Attending: *Deleted | Admitting: *Deleted

## 2012-10-28 ENCOUNTER — Inpatient Hospital Stay
Admission: RE | Admit: 2012-10-28 | Discharge: 2012-10-28 | Disposition: A | Discharge status: Home / Self Care | Payer: Self-pay | Source: Ambulatory Visit | Attending: *Deleted | Admitting: *Deleted

## 2012-10-28 ENCOUNTER — Other Ambulatory Visit: Payer: Self-pay | Admitting: *Deleted

## 2012-10-28 VITALS — BP 154/72 | HR 63

## 2012-10-28 DIAGNOSIS — M549 Dorsalgia, unspecified: Secondary | ICD-10-CM

## 2012-10-28 DIAGNOSIS — M419 Scoliosis, unspecified: Secondary | ICD-10-CM

## 2012-10-28 MED ORDER — DIAZEPAM 5 MG PO TABS
5.0000 mg | ORAL_TABLET | Freq: Once | ORAL | Status: AC
  Administered 2012-10-28: 5 mg via ORAL

## 2012-10-28 MED ORDER — IOHEXOL 180 MG/ML  SOLN
15.0000 mL | Freq: Once | INTRAMUSCULAR | Status: AC | PRN
  Administered 2012-10-28: 15 mL via INTRATHECAL

## 2013-02-17 DIAGNOSIS — M25559 Pain in unspecified hip: Secondary | ICD-10-CM | POA: Insufficient documentation

## 2014-03-23 ENCOUNTER — Ambulatory Visit: Payer: Self-pay | Admitting: Orthopedic Surgery

## 2014-03-23 MED ORDER — ACETAMINOPHEN 10 MG/ML IV SOLN: 1000.0000 mg | Freq: Once | INTRAVENOUS | Status: AC

## 2014-03-23 MED ORDER — DEXAMETHASONE SODIUM PHOSPHATE 4 MG/ML IJ SOLN
4.0000 mg | Freq: Once | INTRAMUSCULAR | Status: AC
  Administered 2014-04-27: 8 mg via INTRAVENOUS

## 2014-03-23 MED ORDER — SODIUM CHLORIDE 0.9 % IV SOLN: 4.0000 mg | Freq: Once | INTRAVENOUS | Status: DC

## 2014-04-15 ENCOUNTER — Ambulatory Visit: Payer: Self-pay | Admitting: Orthopedic Surgery

## 2014-04-15 NOTE — H&P (Signed)
TOTAL KNEE ADMISSION H&P  Patient is being admitted for right total knee arthroplasty.  Subjective:  Chief Complaint:right knee pain.  HPI: Regina Hall, 80 y.o. female, has a history of pain and functional disability in the right knee due to arthritis and has failed non-surgical conservative treatments for greater than 12 weeks to includeNSAID's and/or analgesics, corticosteriod injections, supervised PT with diminished ADL's post treatment, use of assistive devices and activity modification.  Onset of symptoms was gradual, starting 3 years ago with stable course since that time. The patient noted no past surgery on the right knee(s).  Patient currently rates pain in the right knee(s) at 10 out of 10 with activity. Patient has night pain, worsening of pain with activity and weight bearing, pain that interferes with activities of daily living and pain with passive range of motion.  Patient has evidence of subchondral cysts, subchondral sclerosis, periarticular osteophytes and joint space narrowing by imaging studies.  There is no active infection.  There are no active problems to display for this patient.  Past Medical History  Diagnosis Date  . PONV (postoperative nausea and vomiting)   . Hypertension   . Arthritis     Past Surgical History  Procedure Laterality Date  . Joint replacement      2011 LT HIP  . Tubal ligation      1974  . Abdominal hysterectomy      2006 VAG HYST     (Not in a hospital admission) Allergies  Allergen Reactions  . Penicillins Rash    History  Substance Use Topics  . Smoking status: Never Smoker   . Smokeless tobacco: Not on file  . Alcohol Use: Yes     Comment: OCC WINE    No family history on file.   Review of Systems  Constitutional: Negative.   HENT: Negative.   Eyes: Negative.   Respiratory: Negative.  Negative for cough.   Cardiovascular: Negative.   Gastrointestinal: Negative.   Genitourinary: Positive for frequency.   Musculoskeletal: Positive for joint pain.  Skin: Negative.   Neurological: Negative.   Endo/Heme/Allergies: Negative.   Psychiatric/Behavioral: Negative.     Objective:  Physical Exam  Vitals reviewed. Constitutional: She is oriented to person, place, and time. She appears well-developed and well-nourished.  HENT:  Head: Normocephalic and atraumatic.  Eyes: Pupils are equal, round, and reactive to light.  Neck: Normal range of motion. Neck supple.  Cardiovascular: Normal rate, regular rhythm, normal heart sounds and intact distal pulses.   Respiratory: Effort normal and breath sounds normal.  GI: Soft. Bowel sounds are normal.  Genitourinary:  deferred  Musculoskeletal:       Right knee: She exhibits decreased range of motion and swelling. Tenderness found. Medial joint line and lateral joint line tenderness noted.  Neurological: She is alert and oriented to person, place, and time. She has normal reflexes.  Skin: Skin is warm and dry.  Psychiatric: She has a normal mood and affect. Her behavior is normal. Judgment and thought content normal.    Vital signs in last 24 hours: @VSRANGES @  Labs:   Estimated body mass index is 27.98 kg/(m^2) as calculated from the following:   Height as of 07/30/11: 5\' 8"  (1.727 m).   Weight as of 07/30/11: 83.462 kg (184 lb).   Imaging Review Plain radiographs demonstrate severe degenerative joint disease of the right knee(s). The overall alignment ismild varus. The bone quality appears to be adequate for age and reported activity level.  Assessment/Plan:  End stage arthritis, right knee   The patient history, physical examination, clinical judgment of the provider and imaging studies are consistent with end stage degenerative joint disease of the right knee(s) and total knee arthroplasty is deemed medically necessary. The treatment options including medical management, injection therapy arthroscopy and arthroplasty were discussed at  length. The risks and benefits of total knee arthroplasty were presented and reviewed. The risks due to aseptic loosening, infection, stiffness, patella tracking problems, thromboembolic complications and other imponderables were discussed. The patient acknowledged the explanation, agreed to proceed with the plan and consent was signed. Patient is being admitted for inpatient treatment for surgery, pain control, PT, OT, prophylactic antibiotics, VTE prophylaxis, progressive ambulation and ADL's and discharge planning. The patient is planning to be discharged home with home health services

## 2014-04-20 NOTE — Progress Notes (Signed)
Surgery clearance note 03/18/14 Dr. Scotty Court on chart

## 2014-04-20 NOTE — Patient Instructions (Signed)
GENEVIE ELMAN  04/20/2014   Your procedure is scheduled on: Tuesday 04/27/14  Report to [facility]  Entrance and follow signs to               [place] at 05:30 AM.  Call this number if you have problems the morning of surgery [phone]   Remember:  Do not eat food or drink liquids :After Midnight.     Take these medicines the morning of surgery with A SIP OF WATER: eye drops if needed, nose spray if needed                               You may not have any metal on your body including hair pins and              piercings  Do not wear jewelry, make-up, lotions, powders or perfumes.             Do not wear nail polish.  Do not shave  48 hours prior to surgery.              Men may shave face and neck.  Do not bring valuables to the hospital. [place].  Contacts, dentures or bridgework may not be worn into surgery.  Leave suitcase in the car. After surgery it may be brought to your room.     Patients discharged the day of surgery will not be allowed to drive home.  Name and phone number of your driver: N/A  Special Instructions: N/A              Please read over the following fact sheets you were given: MRSA information _____________________________________________________________________           [facility] - Preparing for Surgery Before surgery, you can play an important role.  Because skin is not sterile, your skin needs to be as free of germs as possible.  You can reduce the number of germs on your skin by washing with CHG (chlorahexidine gluconate) soap before surgery.  CHG is an antiseptic cleaner which kills germs and bonds with the skin to continue killing germs even after washing. Please DO NOT use if you have an allergy to CHG or antibacterial soaps.  If your skin becomes reddened/irritated stop using the CHG and inform your nurse when you arrive at Short Stay. Do not shave  (including legs and underarms) for at least 48 hours prior to the first CHG shower.  You may shave your face/neck. Please follow these instructions carefully:  1.  Shower with CHG Soap the night before surgery and the  morning of Surgery.  2.  If you choose to wash your hair, wash your hair first as usual with your  normal  shampoo.  3.  After you shampoo, rinse your hair and body thoroughly to remove the  shampoo.                            4.  Use CHG as you would any other liquid soap.  You can apply chg directly  to the skin and wash  Gently with a scrungie or clean washcloth.  5.  Apply the CHG Soap to your body ONLY FROM THE NECK DOWN.   Do not use on face/ open                           Wound or open sores. Avoid contact with eyes, ears mouth and genitals (private parts).                       Wash face,  Genitals (private parts) with your normal soap.             6.  Wash thoroughly, paying special attention to the area where your surgery  will be performed.  7.  Thoroughly rinse your body with warm water from the neck down.  8.  DO NOT shower/wash with your normal soap after using and rinsing off  the CHG Soap.                9.  Pat yourself dry with a clean towel.            10.  Wear clean pajamas.            11.  Place clean sheets on your bed the night of your first shower and do not  sleep with pets. Day of Surgery : Do not apply any lotions/deodorants the morning of surgery.  Please wear clean clothes to the hospital/surgery center.  FAILURE TO FOLLOW THESE INSTRUCTIONS MAY RESULT IN THE CANCELLATION OF YOUR SURGERY PATIENT SIGNATURE_________________________________  NURSE SIGNATURE__________________________________  ________________________________________________________________________  WHAT IS A BLOOD TRANSFUSION? Blood Transfusion Information  A transfusion is the replacement of blood or some of its parts. Blood is made up of multiple cells which  provide different functions.  Red blood cells carry oxygen and are used for blood loss replacement.  White blood cells fight against infection.  Platelets control bleeding.  Plasma helps clot blood.  Other blood products are available for specialized needs, such as hemophilia or other clotting disorders. BEFORE THE TRANSFUSION  Who gives blood for transfusions?   Healthy volunteers who are fully evaluated to make sure their blood is safe. This is blood bank blood. Transfusion therapy is the safest it has ever been in the practice of medicine. Before blood is taken from a donor, a complete history is taken to make sure that person has no history of diseases nor engages in risky social behavior (examples are intravenous drug use or sexual activity with multiple partners). The donor's travel history is screened to minimize risk of transmitting infections, such as malaria. The donated blood is tested for signs of infectious diseases, such as HIV and hepatitis. The blood is then tested to be sure it is compatible with you in order to minimize the chance of a transfusion reaction. If you or a relative donates blood, this is often done in anticipation of surgery and is not appropriate for emergency situations. It takes many days to process the donated blood. RISKS AND COMPLICATIONS Although transfusion therapy is very safe and saves many lives, the main dangers of transfusion include:  1. Getting an infectious disease. 2. Developing a transfusion reaction. This is an allergic reaction to something in the blood you were given. Every precaution is taken to prevent this. The decision to have a blood transfusion has been considered carefully by your caregiver before blood is given. Blood is not given unless the benefits outweigh  the risks. AFTER THE TRANSFUSION  Right after receiving a blood transfusion, you will usually feel much better and more energetic. This is especially true if your red blood cells  have gotten low (anemic). The transfusion raises the level of the red blood cells which carry oxygen, and this usually causes an energy increase.  The nurse administering the transfusion will monitor you carefully for complications. HOME CARE INSTRUCTIONS  No special instructions are needed after a transfusion. You may find your energy is better. Speak with your caregiver about any limitations on activity for underlying diseases you may have. SEEK MEDICAL CARE IF:   Your condition is not improving after your transfusion.  You develop redness or irritation at the intravenous (IV) site. SEEK IMMEDIATE MEDICAL CARE IF:  Any of the following symptoms occur over the next 12 hours:  Shaking chills.  You have a temperature by mouth above 102 F (38.9 C), not controlled by medicine.  Chest, back, or muscle pain.  People around you feel you are not acting correctly or are confused.  Shortness of breath or difficulty breathing.  Dizziness and fainting.  You get a rash or develop hives.  You have a decrease in urine output.  Your urine turns a dark color or changes to pink, red, or brown. Any of the following symptoms occur over the next 10 days:  You have a temperature by mouth above 102 F (38.9 C), not controlled by medicine.  Shortness of breath.  Weakness after normal activity.  The white part of the eye turns yellow (jaundice).  You have a decrease in the amount of urine or are urinating less often.  Your urine turns a dark color or changes to pink, red, or brown. Document Released: 12/30/1999 Document Revised: 03/26/2011 Document Reviewed: 08/18/2007 ExitCare Patient Information 2014 [place].  _______________________________________________________________________  Incentive Spirometer  An incentive spirometer is a tool that can help keep your lungs clear and active. This tool measures how well you are filling your lungs with each breath. Taking long deep  breaths may help reverse or decrease the chance of developing breathing (pulmonary) problems (especially infection) following:  A long period of time when you are unable to move or be active. BEFORE THE PROCEDURE   If the spirometer includes an indicator to show your best effort, your nurse or respiratory therapist will set it to a desired goal.  If possible, sit up straight or lean slightly forward. Try not to slouch.  Hold the incentive spirometer in an upright position. INSTRUCTIONS FOR USE  3. Sit on the edge of your bed if possible, or sit up as far as you can in bed or on a chair. 4. Hold the incentive spirometer in an upright position. 5. Breathe out normally. 6. Place the mouthpiece in your mouth and seal your lips tightly around it. 7. Breathe in slowly and as deeply as possible, raising the piston or the ball toward the top of the column. 8. Hold your breath for 3-5 seconds or for as long as possible. Allow the piston or ball to fall to the bottom of the column. 9. Remove the mouthpiece from your mouth and breathe out normally. 10. Rest for a few seconds and repeat Steps 1 through 7 at least 10 times every 1-2 hours when you are awake. Take your time and take a few normal breaths between deep breaths. 11. The spirometer may include an indicator to show your best effort. Use the indicator as a goal to work  toward during each repetition. 12. After each set of 10 deep breaths, practice coughing to be sure your lungs are clear. If you have an incision (the cut made at the time of surgery), support your incision when coughing by placing a pillow or rolled up towels firmly against it. Once you are able to get out of bed, walk around indoors and cough well. You may stop using the incentive spirometer when instructed by your caregiver.  RISKS AND COMPLICATIONS  Take your time so you do not get dizzy or light-headed.  If you are in pain, you may need to take or ask for pain medication  before doing incentive spirometry. It is harder to take a deep breath if you are having pain. AFTER USE  Rest and breathe slowly and easily.  It can be helpful to keep track of a log of your progress. Your caregiver can provide you with a simple table to help with this. If you are using the spirometer at home, follow these instructions: [place] IF:   You are having difficultly using the spirometer.  You have trouble using the spirometer as often as instructed.  Your pain medication is not giving enough relief while using the spirometer.  You develop fever of 100.5 F (38.1 C) or higher. SEEK IMMEDIATE MEDICAL CARE IF:   You cough up bloody sputum that had not been present before.  You develop fever of 102 F (38.9 C) or greater.  You develop worsening pain at or near the incision site. MAKE SURE YOU:   Understand these instructions.  Will watch your condition.  Will get help right away if you are not doing well or get worse. Document Released: 05/14/2006 Document Revised: 03/26/2011 Document Reviewed: 07/15/2006 [facility] Patient Information 2014 [place], [place].   ________________________________________________________________________

## 2014-04-21 ENCOUNTER — Encounter (HOSPITAL_COMMUNITY): Payer: Self-pay

## 2014-04-21 ENCOUNTER — Encounter (HOSPITAL_COMMUNITY)
Admission: RE | Admit: 2014-04-21 | Discharge: 2014-04-21 | Disposition: A | Discharge status: Home / Self Care | Payer: Medicare Other | Source: Ambulatory Visit | Attending: Orthopedic Surgery | Admitting: Orthopedic Surgery

## 2014-04-21 DIAGNOSIS — Z7901 Long term (current) use of anticoagulants: Secondary | ICD-10-CM | POA: Diagnosis not present

## 2014-04-21 DIAGNOSIS — Z01810 Encounter for preprocedural cardiovascular examination: Secondary | ICD-10-CM | POA: Diagnosis not present

## 2014-04-21 DIAGNOSIS — Z01812 Encounter for preprocedural laboratory examination: Secondary | ICD-10-CM | POA: Insufficient documentation

## 2014-04-21 HISTORY — DX: Unspecified urinary incontinence: R32

## 2014-04-21 HISTORY — DX: Anemia, unspecified: D64.9

## 2014-04-21 HISTORY — DX: Scoliosis, unspecified: M41.9

## 2014-04-21 LAB — COMPREHENSIVE METABOLIC PANEL
ALBUMIN: 3.9 g/dL (ref 3.5–5.2)
ALK PHOS: 73 U/L (ref 39–117)
ALT: 15 U/L (ref 0–35)
ANION GAP: 8 (ref 5–15)
AST: 18 U/L (ref 0–37)
BUN: 24 mg/dL — ABNORMAL HIGH (ref 6–23)
CALCIUM: 9.4 mg/dL (ref 8.4–10.5)
CHLORIDE: 104 mmol/L (ref 96–112)
CO2: 28 mmol/L (ref 19–32)
Creatinine, Ser: 1.02 mg/dL (ref 0.50–1.10)
GFR calc Af Amer: 60 mL/min — ABNORMAL LOW (ref >90)
GFR calc non Af Amer: 52 mL/min — ABNORMAL LOW (ref >90)
GLUCOSE: 97 mg/dL (ref 70–99)
Potassium: 4.5 mmol/L (ref 3.5–5.1)
SODIUM: 140 mmol/L (ref 135–145)
TOTAL PROTEIN: 6.7 g/dL (ref 6.0–8.3)
Total Bilirubin: 0.7 mg/dL (ref 0.3–1.2)

## 2014-04-21 LAB — URINALYSIS, ROUTINE W REFLEX MICROSCOPIC
Bilirubin Urine: NEGATIVE
Glucose, UA: NEGATIVE mg/dL
Hgb urine dipstick: NEGATIVE
KETONES UR: NEGATIVE mg/dL
Leukocytes, UA: NEGATIVE
NITRITE: NEGATIVE
PROTEIN: NEGATIVE mg/dL
Specific Gravity, Urine: 1.015 (ref 1.005–1.030)
UROBILINOGEN UA: 0.2 mg/dL (ref 0.0–1.0)
pH: 7.5 (ref 5.0–8.0)

## 2014-04-21 LAB — SURGICAL PCR SCREEN
MRSA, PCR: NEGATIVE
STAPHYLOCOCCUS AUREUS: POSITIVE — AB

## 2014-04-21 LAB — CBC
HCT: 42.2 % (ref 36.0–46.0)
HEMOGLOBIN: 13.6 g/dL (ref 12.0–15.0)
MCH: 31.1 pg (ref 26.0–34.0)
MCHC: 32.2 g/dL (ref 30.0–36.0)
MCV: 96.6 fL (ref 78.0–100.0)
Platelets: 175 10*3/uL (ref 150–400)
RBC: 4.37 MIL/uL (ref 3.87–5.11)
RDW: 13.3 % (ref 11.5–15.5)
WBC: 5.9 10*3/uL (ref 4.0–10.5)

## 2014-04-21 LAB — APTT: aPTT: 31 seconds (ref 24–37)

## 2014-04-21 LAB — ABO/RH: ABO/RH(D): B POS

## 2014-04-21 LAB — PROTIME-INR
INR: 1 (ref 0.00–1.49)
PROTHROMBIN TIME: 13.3 s (ref 11.6–15.2)

## 2014-04-26 NOTE — Anesthesia Preprocedure Evaluation (Addendum)
Anesthesia Evaluation  Patient identified by MRN, date of birth, ID band Patient awake    Reviewed: Allergy & Precautions, H&P , NPO status , Patient's Chart, lab work & pertinent test results  History of Anesthesia Complications (+) PONV  Airway Mallampati: II  TM Distance: >3 FB Neck ROM: full    Dental  (+) Dental Advisory Given, Caps Lateral front upper incisors are capped:   Pulmonary neg pulmonary ROS,  breath sounds clear to auscultation  Pulmonary exam normal       Cardiovascular Exercise Tolerance: Good hypertension, Pt. on medications Rhythm:regular Rate:Normal     Neuro/Psych Decompression laminectomy negative neurological ROS  negative psych ROS   GI/Hepatic negative GI ROS, Neg liver ROS,   Endo/Other  negative endocrine ROS  Renal/GU negative Renal ROS  negative genitourinary   Musculoskeletal  (+) Arthritis -,   Abdominal   Peds  Hematology negative hematology ROS (+)   Anesthesia Other Findings   Reproductive/Obstetrics negative OB ROS                          Anesthesia Physical Anesthesia Plan  ASA: II  Anesthesia Plan: Spinal   Post-op Pain Management:    Induction:   Airway Management Planned:   Additional Equipment:   Intra-op Plan:   Post-operative Plan:   Informed Consent: I have reviewed the patients History and Physical, chart, labs and discussed the procedure including the risks, benefits and alternatives for the proposed anesthesia with the patient or authorized representative who has indicated his/her understanding and acceptance.   Dental Advisory Given  Plan Discussed with: CRNA and Surgeon  Anesthesia Plan Comments:        Anesthesia Quick Evaluation

## 2014-04-27 ENCOUNTER — Inpatient Hospital Stay (HOSPITAL_COMMUNITY): Payer: Medicare Other

## 2014-04-27 ENCOUNTER — Encounter (HOSPITAL_COMMUNITY)
Admission: RE | Disposition: A | Discharge status: Home / Self Care | Payer: Self-pay | Source: Ambulatory Visit | Attending: Orthopedic Surgery

## 2014-04-27 ENCOUNTER — Inpatient Hospital Stay (HOSPITAL_COMMUNITY): Payer: Medicare Other | Admitting: Anesthesiology

## 2014-04-27 ENCOUNTER — Encounter (HOSPITAL_COMMUNITY): Payer: Self-pay | Admitting: *Deleted

## 2014-04-27 ENCOUNTER — Inpatient Hospital Stay (HOSPITAL_COMMUNITY)
Admission: RE | Admit: 2014-04-27 | Discharge: 2014-04-29 | DRG: 470 | Disposition: A | Discharge status: Home / Self Care | Payer: Medicare Other | Source: Ambulatory Visit | Attending: Orthopedic Surgery | Admitting: Orthopedic Surgery

## 2014-04-27 DIAGNOSIS — Z90710 Acquired absence of both cervix and uterus: Secondary | ICD-10-CM

## 2014-04-27 DIAGNOSIS — I10 Essential (primary) hypertension: Secondary | ICD-10-CM | POA: Diagnosis present

## 2014-04-27 DIAGNOSIS — Z09 Encounter for follow-up examination after completed treatment for conditions other than malignant neoplasm: Secondary | ICD-10-CM

## 2014-04-27 DIAGNOSIS — M1711 Unilateral primary osteoarthritis, right knee: Secondary | ICD-10-CM | POA: Diagnosis present

## 2014-04-27 DIAGNOSIS — M25561 Pain in right knee: Secondary | ICD-10-CM | POA: Diagnosis present

## 2014-04-27 HISTORY — PX: TOTAL KNEE ARTHROPLASTY: SHX125

## 2014-04-27 LAB — TYPE AND SCREEN
ABO/RH(D): B POS
Antibody Screen: NEGATIVE

## 2014-04-27 SURGERY — ARTHROPLASTY, KNEE, TOTAL
Anesthesia: Spinal | Site: Knee | Laterality: Right

## 2014-04-27 MED ORDER — ACETAMINOPHEN 650 MG RE SUPP: 650.0000 mg | Freq: Four times a day (QID) | RECTAL | Status: DC | PRN

## 2014-04-27 MED ORDER — PROPOFOL INFUSION 10 MG/ML OPTIME
INTRAVENOUS | Status: DC | PRN
  Administered 2014-04-27: 25 ug/kg/min via INTRAVENOUS

## 2014-04-27 MED ORDER — LOSARTAN POTASSIUM 25 MG PO TABS
25.0000 mg | ORAL_TABLET | Freq: Every morning | ORAL | Status: DC
  Administered 2014-04-27 – 2014-04-29 (×3): 25 mg via ORAL
  Filled 2014-04-27 (×3): qty 1

## 2014-04-27 MED ORDER — MENTHOL 3 MG MT LOZG: 1.0000 | LOZENGE | OROMUCOSAL | Status: DC | PRN

## 2014-04-27 MED ORDER — MIDAZOLAM HCL 2 MG/2ML IJ SOLN
INTRAMUSCULAR | Status: AC
  Filled 2014-04-27: qty 2

## 2014-04-27 MED ORDER — HYDROMORPHONE HCL 1 MG/ML IJ SOLN
0.5000 mg | INTRAMUSCULAR | Status: DC | PRN
  Administered 2014-04-27: 0.5 mg via INTRAVENOUS
  Filled 2014-04-27: qty 1

## 2014-04-27 MED ORDER — METOCLOPRAMIDE HCL 5 MG/ML IJ SOLN: 5.0000 mg | Freq: Three times a day (TID) | INTRAMUSCULAR | Status: DC | PRN

## 2014-04-27 MED ORDER — TRANEXAMIC ACID 100 MG/ML IV SOLN
1000.0000 mg | INTRAVENOUS | Status: AC
  Administered 2014-04-27: 1000 mg via INTRAVENOUS
  Filled 2014-04-27: qty 10

## 2014-04-27 MED ORDER — ONDANSETRON HCL 4 MG/2ML IJ SOLN: 4.0000 mg | Freq: Four times a day (QID) | INTRAMUSCULAR | Status: DC | PRN

## 2014-04-27 MED ORDER — SODIUM CHLORIDE 0.9 % IJ SOLN
INTRAMUSCULAR | Status: DC | PRN
  Administered 2014-04-27: 29 mL

## 2014-04-27 MED ORDER — CALCIUM CARBONATE 1250 (500 CA) MG PO TABS
1.0000 | ORAL_TABLET | Freq: Every morning | ORAL | Status: DC
  Administered 2014-04-27 – 2014-04-29 (×3): 500 mg via ORAL
  Filled 2014-04-27 (×3): qty 1

## 2014-04-27 MED ORDER — HYDROGEN PEROXIDE 3 % EX SOLN
CUTANEOUS | Status: DC | PRN
  Administered 2014-04-27: 1

## 2014-04-27 MED ORDER — PROPOFOL 10 MG/ML IV BOLUS
INTRAVENOUS | Status: AC
  Filled 2014-04-27: qty 20

## 2014-04-27 MED ORDER — ONDANSETRON HCL 4 MG/2ML IJ SOLN
INTRAMUSCULAR | Status: AC
  Filled 2014-04-27: qty 2

## 2014-04-27 MED ORDER — SENNA 8.6 MG PO TABS
2.0000 | ORAL_TABLET | Freq: Every day | ORAL | Status: DC
  Administered 2014-04-27 – 2014-04-28 (×2): 17.2 mg via ORAL

## 2014-04-27 MED ORDER — CHLORHEXIDINE GLUCONATE 4 % EX LIQD: 60.0000 mL | Freq: Once | CUTANEOUS | Status: DC

## 2014-04-27 MED ORDER — BUPIVACAINE-EPINEPHRINE (PF) 0.25% -1:200000 IJ SOLN
INTRAMUSCULAR | Status: DC | PRN
Start: 2014-04-27 — End: 2014-04-27
  Administered 2014-04-27: 30 mL

## 2014-04-27 MED ORDER — ISOPROPYL ALCOHOL 70 % SOLN
Status: DC | PRN
  Administered 2014-04-27: 1 via TOPICAL

## 2014-04-27 MED ORDER — ACETAMINOPHEN 10 MG/ML IV SOLN
1000.0000 mg | Freq: Once | INTRAVENOUS | Status: AC
  Administered 2014-04-27: 1000 mg via INTRAVENOUS
  Filled 2014-04-27: qty 100

## 2014-04-27 MED ORDER — ISOPROPYL ALCOHOL 70 % SOLN
Status: AC
  Filled 2014-04-27: qty 480

## 2014-04-27 MED ORDER — DIPHENHYDRAMINE HCL 12.5 MG/5ML PO ELIX: 12.5000 mg | ORAL_SOLUTION | ORAL | Status: DC | PRN

## 2014-04-27 MED ORDER — RIVAROXABAN 10 MG PO TABS
10.0000 mg | ORAL_TABLET | Freq: Every day | ORAL | Status: DC
  Administered 2014-04-28 – 2014-04-29 (×2): 10 mg via ORAL
  Filled 2014-04-27 (×3): qty 1

## 2014-04-27 MED ORDER — CEFAZOLIN SODIUM-DEXTROSE 2-3 GM-% IV SOLR
INTRAVENOUS | Status: AC
  Filled 2014-04-27: qty 50

## 2014-04-27 MED ORDER — KETOROLAC TROMETHAMINE 30 MG/ML IJ SOLN
INTRAMUSCULAR | Status: DC | PRN
  Administered 2014-04-27: 30 mg

## 2014-04-27 MED ORDER — ACETAMINOPHEN 325 MG PO TABS: 650.0000 mg | ORAL_TABLET | Freq: Four times a day (QID) | ORAL | Status: DC | PRN

## 2014-04-27 MED ORDER — CYCLOBENZAPRINE HCL 5 MG PO TABS
5.0000 mg | ORAL_TABLET | Freq: Three times a day (TID) | ORAL | Status: DC | PRN
  Administered 2014-04-29: 5 mg via ORAL
  Filled 2014-04-27: qty 2
  Filled 2014-04-27: qty 1

## 2014-04-27 MED ORDER — BUPIVACAINE-EPINEPHRINE (PF) 0.25% -1:200000 IJ SOLN
INTRAMUSCULAR | Status: AC
  Filled 2014-04-27: qty 30

## 2014-04-27 MED ORDER — SODIUM CHLORIDE 0.9 % IV SOLN
INTRAVENOUS | Status: DC
  Administered 2014-04-27 – 2014-04-28 (×3): via INTRAVENOUS

## 2014-04-27 MED ORDER — BUPIVACAINE HCL (PF) 0.5 % IJ SOLN
INTRAMUSCULAR | Status: AC
  Filled 2014-04-27: qty 30

## 2014-04-27 MED ORDER — SODIUM CHLORIDE 0.9 % IV SOLN: INTRAVENOUS | Status: DC

## 2014-04-27 MED ORDER — MIDAZOLAM HCL 5 MG/5ML IJ SOLN
INTRAMUSCULAR | Status: DC | PRN
  Administered 2014-04-27 (×2): 0.5 mg via INTRAVENOUS

## 2014-04-27 MED ORDER — SODIUM CHLORIDE 0.9 % IJ SOLN
INTRAMUSCULAR | Status: AC
  Filled 2014-04-27: qty 10

## 2014-04-27 MED ORDER — DEXAMETHASONE SODIUM PHOSPHATE 10 MG/ML IJ SOLN
INTRAMUSCULAR | Status: AC
  Filled 2014-04-27: qty 1

## 2014-04-27 MED ORDER — SODIUM CHLORIDE 0.9 % IR SOLN
Status: DC | PRN
  Administered 2014-04-27: 2000 mL

## 2014-04-27 MED ORDER — ONDANSETRON HCL 4 MG PO TABS: 4.0000 mg | ORAL_TABLET | Freq: Four times a day (QID) | ORAL | Status: DC | PRN

## 2014-04-27 MED ORDER — CEFAZOLIN SODIUM-DEXTROSE 2-3 GM-% IV SOLR
2.0000 g | Freq: Four times a day (QID) | INTRAVENOUS | Status: AC
  Administered 2014-04-27 (×2): 2 g via INTRAVENOUS
  Filled 2014-04-27 (×2): qty 50

## 2014-04-27 MED ORDER — EPHEDRINE SULFATE 50 MG/ML IJ SOLN
INTRAMUSCULAR | Status: AC
  Filled 2014-04-27: qty 1

## 2014-04-27 MED ORDER — ONDANSETRON HCL 4 MG/2ML IJ SOLN
INTRAMUSCULAR | Status: DC | PRN
  Administered 2014-04-27: 4 mg via INTRAVENOUS

## 2014-04-27 MED ORDER — LIDOCAINE HCL (CARDIAC) 20 MG/ML IV SOLN
INTRAVENOUS | Status: DC | PRN
  Administered 2014-04-27: 100 mg via INTRAVENOUS

## 2014-04-27 MED ORDER — DOCUSATE SODIUM 100 MG PO CAPS
100.0000 mg | ORAL_CAPSULE | Freq: Two times a day (BID) | ORAL | Status: DC
  Administered 2014-04-27 – 2014-04-29 (×4): 100 mg via ORAL

## 2014-04-27 MED ORDER — BUPIVACAINE HCL (PF) 0.5 % IJ SOLN
INTRAMUSCULAR | Status: DC | PRN
Start: 2014-04-27 — End: 2014-04-27
  Administered 2014-04-27: 15 mg

## 2014-04-27 MED ORDER — HYDROCODONE-ACETAMINOPHEN 5-325 MG PO TABS
1.0000 | ORAL_TABLET | ORAL | Status: DC | PRN
  Administered 2014-04-27 – 2014-04-29 (×6): 2 via ORAL
  Administered 2014-04-29: 1 via ORAL
  Filled 2014-04-27 (×3): qty 2
  Filled 2014-04-27: qty 1
  Filled 2014-04-27 (×3): qty 2

## 2014-04-27 MED ORDER — PHENYLEPHRINE HCL 10 MG/ML IJ SOLN
INTRAMUSCULAR | Status: AC
  Filled 2014-04-27: qty 2

## 2014-04-27 MED ORDER — PHENYLEPHRINE HCL 10 MG/ML IJ SOLN
20.0000 mg | INTRAVENOUS | Status: DC | PRN
  Administered 2014-04-27: 15 ug/min via INTRAVENOUS

## 2014-04-27 MED ORDER — PHENYLEPHRINE HCL 10 MG/ML IJ SOLN
INTRAMUSCULAR | Status: DC | PRN
  Administered 2014-04-27: 40 ug via INTRAVENOUS

## 2014-04-27 MED ORDER — EPHEDRINE SULFATE 50 MG/ML IJ SOLN
INTRAMUSCULAR | Status: DC | PRN
  Administered 2014-04-27 (×2): 5 mg via INTRAVENOUS

## 2014-04-27 MED ORDER — KETOROLAC TROMETHAMINE 30 MG/ML IJ SOLN
INTRAMUSCULAR | Status: AC
  Filled 2014-04-27: qty 1

## 2014-04-27 MED ORDER — FENTANYL CITRATE 0.05 MG/ML IJ SOLN
INTRAMUSCULAR | Status: AC
  Filled 2014-04-27: qty 2

## 2014-04-27 MED ORDER — KETOROLAC TROMETHAMINE 15 MG/ML IJ SOLN
15.0000 mg | Freq: Four times a day (QID) | INTRAMUSCULAR | Status: AC
  Administered 2014-04-27 – 2014-04-28 (×4): 15 mg via INTRAVENOUS
  Filled 2014-04-27 (×4): qty 1

## 2014-04-27 MED ORDER — METOCLOPRAMIDE HCL 10 MG PO TABS: 5.0000 mg | ORAL_TABLET | Freq: Three times a day (TID) | ORAL | Status: DC | PRN

## 2014-04-27 MED ORDER — SODIUM CHLORIDE 0.9 % IJ SOLN
INTRAMUSCULAR | Status: AC
  Filled 2014-04-27: qty 50

## 2014-04-27 MED ORDER — TRIAMTERENE-HCTZ 37.5-25 MG PO CAPS
1.0000 | ORAL_CAPSULE | Freq: Every morning | ORAL | Status: DC
  Administered 2014-04-27 – 2014-04-29 (×3): 1 via ORAL
  Filled 2014-04-27 (×3): qty 1

## 2014-04-27 MED ORDER — PHENYLEPHRINE 40 MCG/ML (10ML) SYRINGE FOR IV PUSH (FOR BLOOD PRESSURE SUPPORT)
PREFILLED_SYRINGE | INTRAVENOUS | Status: AC
  Filled 2014-04-27: qty 10

## 2014-04-27 MED ORDER — NAPHAZOLINE-PHENIRAMINE 0.025-0.3 % OP SOLN: 2.0000 [drp] | Freq: Every day | OPHTHALMIC | Status: DC | PRN

## 2014-04-27 MED ORDER — CEFAZOLIN SODIUM-DEXTROSE 2-3 GM-% IV SOLR
2.0000 g | INTRAVENOUS | Status: AC
  Administered 2014-04-27: 2 g via INTRAVENOUS

## 2014-04-27 MED ORDER — VITAMIN B-12 1000 MCG PO TABS
1000.0000 ug | ORAL_TABLET | Freq: Every morning | ORAL | Status: DC
  Administered 2014-04-27 – 2014-04-29 (×3): 1000 ug via ORAL
  Filled 2014-04-27 (×3): qty 1

## 2014-04-27 MED ORDER — LACTATED RINGERS IV SOLN: INTRAVENOUS | Status: DC

## 2014-04-27 MED ORDER — LACTATED RINGERS IV SOLN
INTRAVENOUS | Status: DC | PRN
  Administered 2014-04-27: 07:00:00 via INTRAVENOUS

## 2014-04-27 MED ORDER — PHENOL 1.4 % MT LIQD: 1.0000 | OROMUCOSAL | Status: DC | PRN

## 2014-04-27 MED ORDER — ALUM & MAG HYDROXIDE-SIMETH 200-200-20 MG/5ML PO SUSP: 30.0000 mL | ORAL | Status: DC | PRN

## 2014-04-27 MED ORDER — HYDROMORPHONE HCL 1 MG/ML IJ SOLN: 0.2500 mg | INTRAMUSCULAR | Status: DC | PRN

## 2014-04-27 MED ORDER — LIDOCAINE HCL (CARDIAC) 20 MG/ML IV SOLN
INTRAVENOUS | Status: AC
  Filled 2014-04-27: qty 5

## 2014-04-27 MED ORDER — HYDROGEN PEROXIDE 3 % EX SOLN
CUTANEOUS | Status: AC
  Filled 2014-04-27: qty 473

## 2014-04-27 MED ORDER — FENTANYL CITRATE 0.05 MG/ML IJ SOLN
INTRAMUSCULAR | Status: DC | PRN
  Administered 2014-04-27 (×2): 25 ug via INTRAVENOUS
  Administered 2014-04-27: 50 ug via INTRAVENOUS

## 2014-04-27 SURGICAL SUPPLY — 61 items
BAG ZIPLOCK 12X15 (MISCELLANEOUS) IMPLANT
BANDAGE ELASTIC 4 VELCRO ST LF (GAUZE/BANDAGES/DRESSINGS) ×3 IMPLANT
BANDAGE ELASTIC 6 VELCRO ST LF (GAUZE/BANDAGES/DRESSINGS) ×3 IMPLANT
BANDAGE ESMARK 6X9 LF (GAUZE/BANDAGES/DRESSINGS) ×1 IMPLANT
BLADE SAW RECIPROCATING 77.5 (BLADE) ×3 IMPLANT
BNDG ESMARK 6X9 LF (GAUZE/BANDAGES/DRESSINGS) ×3
BONE CEMENT SIMPLEX TOBRAMYCIN (Cement) ×6 IMPLANT
CAPT KNEE TOTAL 3 ×3 IMPLANT
CEMENT BONE SIMPLEX TOBRAMYCIN (Cement) ×3 IMPLANT
CHLORAPREP W/TINT 26ML (MISCELLANEOUS) ×6 IMPLANT
CUFF TOURN SGL QUICK 34 (TOURNIQUET CUFF) ×2
CUFF TRNQT CYL 34X4X40X1 (TOURNIQUET CUFF) ×1 IMPLANT
DECANTER SPIKE VIAL GLASS SM (MISCELLANEOUS) ×3 IMPLANT
DRAPE EXTREMITY T 121X128X90 (DRAPE) ×3 IMPLANT
DRAPE POUCH INSTRU U-SHP 10X18 (DRAPES) ×3 IMPLANT
DRAPE SHEET LG 3/4 BI-LAMINATE (DRAPES) ×3 IMPLANT
DRAPE U-SHAPE 47X51 STRL (DRAPES) ×3 IMPLANT
DRSG AQUACEL AG ADV 3.5X10 (GAUZE/BANDAGES/DRESSINGS) ×3 IMPLANT
DRSG AQUACEL AG ADV 3.5X14 (GAUZE/BANDAGES/DRESSINGS) ×3 IMPLANT
DRSG TEGADERM 4X4.75 (GAUZE/BANDAGES/DRESSINGS) ×3 IMPLANT
ELECT REM PT RETURN 9FT ADLT (ELECTROSURGICAL) ×3
ELECTRODE REM PT RTRN 9FT ADLT (ELECTROSURGICAL) ×1 IMPLANT
EVACUATOR 1/8 PVC DRAIN (DRAIN) IMPLANT
FACESHIELD WRAPAROUND (MASK) ×9 IMPLANT
GAUZE SPONGE 4X4 12PLY STRL (GAUZE/BANDAGES/DRESSINGS) ×3 IMPLANT
GLOVE BIO SURGEON STRL SZ8.5 (GLOVE) ×9 IMPLANT
GLOVE BIOGEL PI IND STRL 8.5 (GLOVE) ×1 IMPLANT
GLOVE BIOGEL PI INDICATOR 8.5 (GLOVE) ×2
GOWN SPEC L3 XXLG W/TWL (GOWN DISPOSABLE) ×3 IMPLANT
HANDPIECE INTERPULSE COAX TIP (DISPOSABLE) ×2
HOOD PEEL AWAY FACE SHEILD DIS (HOOD) ×6 IMPLANT
KIT BASIN OR (CUSTOM PROCEDURE TRAY) ×3 IMPLANT
LIQUID BAND (GAUZE/BANDAGES/DRESSINGS) ×3 IMPLANT
MANIFOLD NEPTUNE II (INSTRUMENTS) ×3 IMPLANT
NEEDLE SPNL 18GX3.5 QUINCKE PK (NEEDLE) ×3 IMPLANT
PACK TOTAL JOINT (CUSTOM PROCEDURE TRAY) ×3 IMPLANT
PADDING CAST COTTON 6X4 STRL (CAST SUPPLIES) ×3 IMPLANT
PEN SKIN MARKING BROAD (MISCELLANEOUS) ×3 IMPLANT
POSITIONER SURGICAL ARM (MISCELLANEOUS) ×3 IMPLANT
SAW OSC TIP CART 19.5X105X1.3 (SAW) ×3 IMPLANT
SEALER BIPOLAR AQUA 6.0 (INSTRUMENTS) ×3 IMPLANT
SET HNDPC FAN SPRY TIP SCT (DISPOSABLE) ×1 IMPLANT
SET PAD KNEE POSITIONER (MISCELLANEOUS) ×3 IMPLANT
SOL PREP POV-IOD 4OZ 10% (MISCELLANEOUS) ×3 IMPLANT
SPONGE DRAIN TRACH 4X4 STRL 2S (GAUZE/BANDAGES/DRESSINGS) ×3 IMPLANT
SUCTION FRAZIER 12FR DISP (SUCTIONS) IMPLANT
SUT MNCRL AB 3-0 PS2 18 (SUTURE) ×3 IMPLANT
SUT MON AB 2-0 CT1 36 (SUTURE) ×6 IMPLANT
SUT VIC AB 1 CT1 36 (SUTURE) ×6 IMPLANT
SUT VIC AB 2-0 CT1 27 (SUTURE) ×2
SUT VIC AB 2-0 CT1 TAPERPNT 27 (SUTURE) ×1 IMPLANT
SUT VLOC 180 0 24IN GS25 (SUTURE) ×3 IMPLANT
SYR 50ML LL SCALE MARK (SYRINGE) ×3 IMPLANT
SYRINGE 20CC LL (MISCELLANEOUS) ×3 IMPLANT
TOWEL OR 17X26 10 PK STRL BLUE (TOWEL DISPOSABLE) ×6 IMPLANT
TOWEL OR NON WOVEN STRL DISP B (DISPOSABLE) ×3 IMPLANT
TOWER CARTRIDGE SMART MIX (DISPOSABLE) IMPLANT
TRAY FOLEY CATH 14FRSI W/METER (CATHETERS) ×3 IMPLANT
WATER STERILE IRR 1500ML POUR (IV SOLUTION) ×3 IMPLANT
WRAP KNEE MAXI GEL POST OP (GAUZE/BANDAGES/DRESSINGS) ×3 IMPLANT
YANKAUER SUCT BULB TIP 10FT TU (MISCELLANEOUS) ×3 IMPLANT

## 2014-04-27 NOTE — Interval H&P Note (Signed)
History and Physical Interval Note:  04/27/2014 7:28 AM  Regina Hall  has presented today for surgery, with the diagnosis of right knee osteoarthritis  The various methods of treatment have been discussed with the patient and family. After consideration of risks, benefits and other options for treatment, the patient has consented to  Procedure(s): RIGH TOTAL KNEE ARTHROPLASTY (Right) as a surgical intervention .  The patient's history has been reviewed, patient examined, no change in status, stable for surgery.  I have reviewed the patient's chart and labs.  Questions were answered to the patient's satisfaction.     Kennesha Brewbaker, Horald Pollen

## 2014-04-27 NOTE — Transfer of Care (Signed)
Immediate Anesthesia Transfer of Care Note  Patient: Regina Hall  Procedure(s) Performed: Procedure(s): RIGH TOTAL KNEE ARTHROPLASTY (Right)  Patient Location: PACU  Anesthesia Type:Spinal  Level of Consciousness: awake, alert , oriented and patient cooperative  Airway & Oxygen Therapy: Patient Spontanous Breathing and Patient connected to face mask oxygen  Post-op Assessment: Report given to RN, Post -op Vital signs reviewed and stable and Patient moving all extremities  Post vital signs: Reviewed and stable  Last Vitals:  Filed Vitals:   04/27/14 0534  BP: 143/76  Pulse: 87  Temp: 36.7 C  Resp: 18    Complications: No apparent anesthesia complications

## 2014-04-27 NOTE — Op Note (Signed)
OPERATIVE REPORT  SURGEON: Rod Can, MD   ASSISTANT: Roberto Scales, RNFA.  PREOPERATIVE DIAGNOSIS: Right knee arthritis.   POSTOPERATIVE DIAGNOSIS: Right knee arthritis.   PROCEDURE: Right total knee arthroplasty.   IMPLANTS: Stryker Triathlon CR femur, size 4. Stryker universal tibia, size 4. X3 polyethelyene insert, size 19 mm, CS. 3 button asymmetric patella, size 32 mm. Simplex P bone cement.  ANESTHESIA:  Spinal  ESTIMATED BLOOD LOSS: 50cc.  ANTIBIOTICS: 2g ancef.  DRAINS: Medium HV x1.  COMPLICATIONS: None   CONDITION: PACU - hemodynamically stable.   BRIEF CLINICAL NOTE: Regina Hall is a 80 y.o. female with a long-standing history of Right knee arthritis. After failing conservative management, the patient was indicated for total knee arthroplasty. The risks, benefits, and alternatives to the procedure were explained, and the patient elected to proceed.  PROCEDURE IN DETAIL: Spinal anesthesia was obtained in the pre-op holding area. Once inside the operative room, a foley catheter was inserted. The patient was then positioned, a nonsterile tourniquet was placed, and the lower extremity was prepped and draped in the normal sterile surgical fashion. A time-out was called verifying side and site of surgery. The patient received IV antibiotics within 60 minutes of beginning the procedure. The extremity was exsanguinated with an Esmarch, and the tourniquet was inflated.  An anterior approach to the knee was performed utilizing a midvastus arthrotomy. A medial release was performed and the patellar fat pad was excised. A 6-degree distal femur valgus cut was made with an intramedullary guide. A freehand patellar resection was performed, and the patella was sized an prepared with 3 lug holes.  The proximal tibial resection was performed using an extramedullary guide, leaving a bone island for the PCL. The menisci were excised. A spacer block was placed, and  the alignment and balance in extension were confirmed.   The distal femur was sized using the 3-degree external rotation guide referencing the posterior femoral cortex. The appropriate 4-in-1 cutting block was pinned into place, and the remaining femoral cuts were performed, taking care to protect the MCL.  The tibia was sized and the trial tray was pinned into place. The remaining trail components were inserted. The knee was stable to varus and valgus stress through a full range of motion. The patella tracked centrally, and the PCL was well balanced. The trial components were removed, and the proximal tibial surface was prepared. Small drill holes were made in the sclerotic subchondral bone.The cut bony surfaces were irrigated with pulse lavage. Final components were cemented into place and excess cement was cleared. The trial insert was placed, and the knee was brought into extension while the cement polymerized. Once the cement was hard, the knee was tested for a final time and found to be well balanced. The trial insert was exchanged for the real polyethylene insert.  The wound was copiously irrigated with a dilute betadine solution followed by normal saline with pulse lavage. Marcaine solution was injected into the periarticular soft tissue. The wound was closed in layers using #1 Vicryl and V-Loc for the fascia, 2-0 Vicryl for the subcutaneous fat, 2-0 Monocryl for the deep dermal layer, 3-0 running Monocryl subcuticular Stitch, and Dermabond for the skin. Once the glue was fully dried, an Aquacell Ag and compressive dressing were applied. The tourniquet was let down, and the patient was transported to the recovery room in stable ondition. Sponge, needle, and instrument counts were correct at the end of the case x2. The patient tolerated the procedure well and  there were no known complications.

## 2014-04-27 NOTE — H&P (View-Only) (Signed)
TOTAL KNEE ADMISSION H&P  Patient is being admitted for right total knee arthroplasty.  Subjective:  Chief Complaint:right knee pain.  HPI: Regina Hall, 80 y.o. female, has a history of pain and functional disability in the right knee due to arthritis and has failed non-surgical conservative treatments for greater than 12 weeks to includeNSAID's and/or analgesics, corticosteriod injections, supervised PT with diminished ADL's post treatment, use of assistive devices and activity modification.  Onset of symptoms was gradual, starting 3 years ago with stable course since that time. The patient noted no past surgery on the right knee(s).  Patient currently rates pain in the right knee(s) at 10 out of 10 with activity. Patient has night pain, worsening of pain with activity and weight bearing, pain that interferes with activities of daily living and pain with passive range of motion.  Patient has evidence of subchondral cysts, subchondral sclerosis, periarticular osteophytes and joint space narrowing by imaging studies.  There is no active infection.  There are no active problems to display for this patient.  Past Medical History  Diagnosis Date  . PONV (postoperative nausea and vomiting)   . Hypertension   . Arthritis     Past Surgical History  Procedure Laterality Date  . Joint replacement      2011 LT HIP  . Tubal ligation      1974  . Abdominal hysterectomy      2006 VAG HYST     (Not in a hospital admission) Allergies  Allergen Reactions  . Penicillins Rash    History  Substance Use Topics  . Smoking status: Never Smoker   . Smokeless tobacco: Not on file  . Alcohol Use: Yes     Comment: OCC WINE    No family history on file.   Review of Systems  Constitutional: Negative.   HENT: Negative.   Eyes: Negative.   Respiratory: Negative.  Negative for cough.   Cardiovascular: Negative.   Gastrointestinal: Negative.   Genitourinary: Positive for frequency.   Musculoskeletal: Positive for joint pain.  Skin: Negative.   Neurological: Negative.   Endo/Heme/Allergies: Negative.   Psychiatric/Behavioral: Negative.     Objective:  Physical Exam  Vitals reviewed. Constitutional: She is oriented to person, place, and time. She appears well-developed and well-nourished.  HENT:  Head: Normocephalic and atraumatic.  Eyes: Pupils are equal, round, and reactive to light.  Neck: Normal range of motion. Neck supple.  Cardiovascular: Normal rate, regular rhythm, normal heart sounds and intact distal pulses.   Respiratory: Effort normal and breath sounds normal.  GI: Soft. Bowel sounds are normal.  Genitourinary:  deferred  Musculoskeletal:       Right knee: She exhibits decreased range of motion and swelling. Tenderness found. Medial joint line and lateral joint line tenderness noted.  Neurological: She is alert and oriented to person, place, and time. She has normal reflexes.  Skin: Skin is warm and dry.  Psychiatric: She has a normal mood and affect. Her behavior is normal. Judgment and thought content normal.    Vital signs in last 24 hours: @VSRANGES @  Labs:   Estimated body mass index is 27.98 kg/(m^2) as calculated from the following:   Height as of 07/30/11: 5\' 8"  (1.727 m).   Weight as of 07/30/11: 83.462 kg (184 lb).   Imaging Review Plain radiographs demonstrate severe degenerative joint disease of the right knee(s). The overall alignment ismild varus. The bone quality appears to be adequate for age and reported activity level.  Assessment/Plan:  End stage arthritis, right knee   The patient history, physical examination, clinical judgment of the provider and imaging studies are consistent with end stage degenerative joint disease of the right knee(s) and total knee arthroplasty is deemed medically necessary. The treatment options including medical management, injection therapy arthroscopy and arthroplasty were discussed at  length. The risks and benefits of total knee arthroplasty were presented and reviewed. The risks due to aseptic loosening, infection, stiffness, patella tracking problems, thromboembolic complications and other imponderables were discussed. The patient acknowledged the explanation, agreed to proceed with the plan and consent was signed. Patient is being admitted for inpatient treatment for surgery, pain control, PT, OT, prophylactic antibiotics, VTE prophylaxis, progressive ambulation and ADL's and discharge planning. The patient is planning to be discharged home with home health services

## 2014-04-27 NOTE — Anesthesia Procedure Notes (Addendum)
Procedure Name: MAC Date/Time: 04/27/2014 7:30 AM Performed by: Carleene Cooper A Pre-anesthesia Checklist: Patient identified, Timeout performed, Emergency Drugs available, Suction available and Patient being monitored Patient Re-evaluated:Patient Re-evaluated prior to inductionOxygen Delivery Method: Simple face mask Dental Injury: Teeth and Oropharynx as per pre-operative assessment    Spinal Patient location during procedure: OR Start time: 04/27/2014 7:40 AM End time: 04/27/2014 7:46 AM Staffing Anesthesiologist: Rod Mae Performed by: anesthesiologist  Preanesthetic Checklist Completed: patient identified, site marked, surgical consent, pre-op evaluation, timeout performed, IV checked, risks and benefits discussed and monitors and equipment checked Spinal Block Patient position: sitting Prep: Betadine Patient monitoring: heart rate, continuous pulse ox and blood pressure Approach: midline Location: L3-4 Injection technique: single-shot Needle Needle type: Spinocan  Needle gauge: 22 G Needle length: 9 cm Assessment Sensory level: T6 Additional Notes Expiration date of kit checked and confirmed. Patient tolerated procedure well, without complications.

## 2014-04-27 NOTE — Evaluation (Signed)
Physical Therapy Evaluation Patient Details Name: Regina Hall MRN: [id number] DOB: 1934-04-21 Today's Date: 04/27/2014   History of Present Illness  80 yo female s/p R TKA 04/27/14.   Clinical Impression  On eval POD 0, pt required Min assist for mobility-able to ambulate ~50 feet with RW. Pt had difficulty using L UE to assist with mobility due to IV site. Pt tolerated activity fairly well.     Follow Up Recommendations Home health PT    Equipment Recommendations  None recommended by PT    Recommendations for Other Services OT consult     Precautions / Restrictions Precautions Precautions: Fall Restrictions Weight Bearing Restrictions: No RLE Weight Bearing: Weight bearing as tolerated      Mobility  Bed Mobility Overal bed mobility: Needs Assistance       Supine to sit: Min assist     General bed mobility comments: assist for R LE. Increased time. Difficulty using L hand to assist due to IV site.   Transfers Overall transfer level: Needs assistance Equipment used: Rolling walker (2 wheeled) Transfers: Sit to/from Stand Sit to Stand: Min assist         General transfer comment: assist to rise, stabilize, control descent. VCs safety, technique, hand placement. Difficulty using L hand to assist due to IV site.   Ambulation/Gait Ambulation/Gait assistance: Min assist Ambulation Distance (Feet): 50 Feet Assistive device: Rolling walker (2 wheeled) Gait Pattern/deviations: Step-to pattern;Antalgic     General Gait Details: assist to stabilize pt and maneuver safely with RW. Pt with difficulty due to IV site. VCs safety and attention to task (pt somewhat talkative and distracted at times)  Stairs            Wheelchair Mobility    Modified Rankin (Stroke Patients Only)       Balance                                             Pertinent Vitals/Pain Pain Assessment: 0-10 Pain Score: 4  Pain Location: back; R knee Pain  Descriptors / Indicators: Sore;Aching Pain Intervention(s): Monitored during session;Ice applied;Repositioned    Home Living Family/patient expects to be discharged to:: Private residence Living Arrangements: Spouse/significant other   Type of Home: House Home Access: Stairs to enter Entrance Stairs-Rails: None Entrance Stairs-Number of Steps: 2-close to railing surrounding deck Home Layout: One level Home Equipment: [place] - single point;Walker - 2 wheels;Bedside commode      Prior Function Level of Independence: Independent               Hand Dominance        Extremity/Trunk Assessment   Upper Extremity Assessment: Defer to OT evaluation           Lower Extremity Assessment: RLE deficits/detail RLE Deficits / Details: hip flex at least 3/5, knee ext at least 3/5, moves ankle well    Cervical / Trunk Assessment: Normal  Communication   Communication: No difficulties  Cognition Arousal/Alertness: Awake/alert Behavior During Therapy: WFL for tasks assessed/performed Overall Cognitive Status: Within Functional Limits for tasks assessed                      General Comments      Exercises        Assessment/Plan    PT Assessment Patient needs continued PT services  PT Diagnosis Difficulty  walking;Acute pain   PT Problem List Decreased strength;Decreased range of motion;Decreased activity tolerance;Decreased balance;Decreased mobility;Pain;Decreased knowledge of use of DME  PT Treatment Interventions DME instruction;Gait training;Stair training;Functional mobility training;Therapeutic activities;Therapeutic exercise;Patient/family education   PT Goals (Current goals can be found in the Care Plan section) Acute Rehab PT Goals Patient Stated Goal: home soon. regain independence PT Goal Formulation: With patient Time For Goal Achievement: 05/04/14 Potential to Achieve Goals: Good    Frequency 7X/week   Barriers to discharge         Co-evaluation               End of Session Equipment Utilized During Treatment: Gait belt Activity Tolerance: Patient limited by pain Patient left: in chair;with call bell/phone within reach           Time: 8657-8469 PT Time Calculation (min) (ACUTE ONLY): 13 min   Charges:   PT Evaluation $Initial PT Evaluation Tier I: 1 Procedure     PT G Codes:        Weston Anna, MPT Pager: [phone]

## 2014-04-27 NOTE — Progress Notes (Signed)
Utilization review completed.  

## 2014-04-27 NOTE — Anesthesia Postprocedure Evaluation (Signed)
  Anesthesia Post-op Note  Patient: Regina Hall  Procedure(s) Performed: Procedure(s) (LRB): RIGH TOTAL KNEE ARTHROPLASTY (Right)  Patient Location: PACU  Anesthesia Type: Spinal  Level of Consciousness: awake and alert   Airway and Oxygen Therapy: Patient Spontanous Breathing  Post-op Pain: mild  Post-op Assessment: Post-op Vital signs reviewed, Patient's Cardiovascular Status Stable, Respiratory Function Stable, Patent Airway and No signs of Nausea or vomiting  Last Vitals:  Filed Vitals:   04/27/14 1221  BP: 138/75  Pulse: 69  Temp: 36.4 C  Resp: 14    Post-op Vital Signs: stable   Complications: No apparent anesthesia complications

## 2014-04-28 LAB — CBC
HEMATOCRIT: 33.3 % — AB (ref 36.0–46.0)
HEMOGLOBIN: 11.2 g/dL — AB (ref 12.0–15.0)
MCH: 32.2 pg (ref 26.0–34.0)
MCHC: 33.6 g/dL (ref 30.0–36.0)
MCV: 95.7 fL (ref 78.0–100.0)
Platelets: 163 10*3/uL (ref 150–400)
RBC: 3.48 MIL/uL — AB (ref 3.87–5.11)
RDW: 13 % (ref 11.5–15.5)
WBC: 12.3 10*3/uL — ABNORMAL HIGH (ref 4.0–10.5)

## 2014-04-28 LAB — BASIC METABOLIC PANEL
Anion gap: 7 (ref 5–15)
BUN: 28 mg/dL — AB (ref 6–23)
CHLORIDE: 107 mmol/L (ref 96–112)
CO2: 26 mmol/L (ref 19–32)
Calcium: 8.6 mg/dL (ref 8.4–10.5)
Creatinine, Ser: 1.17 mg/dL — ABNORMAL HIGH (ref 0.50–1.10)
GFR calc Af Amer: 51 mL/min — ABNORMAL LOW (ref >90)
GFR calc non Af Amer: 44 mL/min — ABNORMAL LOW (ref >90)
GLUCOSE: 155 mg/dL — AB (ref 70–99)
POTASSIUM: 4.6 mmol/L (ref 3.5–5.1)
SODIUM: 140 mmol/L (ref 135–145)

## 2014-04-28 MED ORDER — ONDANSETRON HCL 4 MG PO TABS: 4.0000 mg | ORAL_TABLET | Freq: Four times a day (QID) | ORAL | Status: DC | PRN

## 2014-04-28 MED ORDER — RIVAROXABAN 10 MG PO TABS: 10.0000 mg | ORAL_TABLET | Freq: Every day | ORAL | Status: DC

## 2014-04-28 MED ORDER — HYDROCODONE-ACETAMINOPHEN 5-325 MG PO TABS: 1.0000 | ORAL_TABLET | ORAL | Status: DC | PRN

## 2014-04-28 NOTE — Discharge Summary (Signed)
Physician Discharge Summary  Patient ID: Regina Hall MRN: [id number] DOB/AGE: September 02, 1934 80 y.o.  Admit date: 04/27/2014 Discharge date: 04/29/2014  Admission Diagnoses:  Primary osteoarthritis of right knee  Discharge Diagnoses:  Principal Problem:   Primary osteoarthritis of right knee   Past Medical History  Diagnosis Date  . Hypertension   . Arthritis     KNEES AND BACK  . Anemia   . Bladder incontinence   . PONV (postoperative nausea and vomiting)     PONV X 1 EPISODE, COMES OUT OF ANESTHESIA FAST, SOME AWARENESS DURING END OF COLONSCOPY  . Scoliosis     Surgeries: Procedure(s): RIGH TOTAL KNEE ARTHROPLASTY on 04/27/2014   Consultants (if any):    Discharged Condition: Improved  Hospital Course: Regina Hall is an 80 y.o. female who was admitted 04/27/2014 with a diagnosis of Primary osteoarthritis of right knee and went to the operating room on 04/27/2014 and underwent the above named procedures.    She was given perioperative antibiotics:      Anti-infectives    Start     Dose/Rate Route Frequency Ordered Stop   04/27/14 1400  ceFAZolin (ANCEF) IVPB 2 g/50 mL premix     2 g 100 mL/hr over 30 Minutes Intravenous Every 6 hours 04/27/14 1241 04/27/14 2017   04/27/14 0558  ceFAZolin (ANCEF) IVPB 2 g/50 mL premix     2 g 100 mL/hr over 30 Minutes Intravenous On call to O.R. 04/27/14 6283 04/27/14 0748    .  She was given sequential compression devices, early ambulation, and xarelto for DVT prophylaxis.  She benefited maximally from the hospital stay and there were no complications.    Recent vital signs:  Filed Vitals:   04/29/14 0526  BP: 137/67  Pulse: 82  Temp: 98.2 F (36.8 C)  Resp: 16    Recent laboratory studies:  Lab Results  Component Value Date   HGB 11.2* 04/29/2014   HGB 11.2* 04/28/2014   HGB 13.6 04/21/2014   Lab Results  Component Value Date   WBC 11.0* 04/29/2014   PLT 153 04/29/2014   Lab Results  Component Value Date   INR 1.00 04/21/2014   Lab Results  Component Value Date   NA 140 04/28/2014   K 4.6 04/28/2014   CL 107 04/28/2014   CO2 26 04/28/2014   BUN 28* 04/28/2014   CREATININE 1.17* 04/28/2014   GLUCOSE 155* 04/28/2014    Discharge Medications:     Medication List    STOP taking these medications        HYDROcodone-acetaminophen 10-650 MG per tablet  Commonly known as:  LORCET  Replaced by:  HYDROcodone-acetaminophen 5-325 MG per tablet     naproxen sodium 220 MG tablet  Commonly known as:  ANAPROX      TAKE these medications        Biotin 10 MG Tabs  Take 1 tablet by mouth every morning.     calcium carbonate 1250 (500 CA) MG tablet  Commonly known as:  OS-CAL - dosed in mg of elemental calcium  Take 1 tablet by mouth every morning.     CHIA OIL PO  Take by mouth at bedtime. One tablespoon     cyclobenzaprine 10 MG tablet  Commonly known as:  FLEXERIL  Take 5-10 mg by mouth 3 (three) times daily as needed for muscle spasms.     folic acid 151 MCG tablet  Commonly known as:  FOLVITE  Take 400 mcg by mouth  every morning.     GLUCOSAMINE FORTE PO  Take 2 tablets by mouth at bedtime.     HYDROcodone-acetaminophen 5-325 MG per tablet  Commonly known as:  NORCO/VICODIN  Take 1-2 tablets by mouth every 4 (four) hours as needed (breakthrough pain).     IMMUPLEX LOZENGE MT  Use as directed 1 each in the mouth or throat daily as needed (cold weather/around people who are sick.).     losartan 25 MG tablet  Commonly known as:  COZAAR  Take 25 mg by mouth every morning.     ondansetron 4 MG tablet  Commonly known as:  ZOFRAN  Take 1 tablet (4 mg total) by mouth every 6 (six) hours as needed for nausea.     OVER THE COUNTER MEDICATION  Take 1 tablet by mouth daily as needed (when around people that are sick/ cold weather.). congutlex     OVER THE COUNTER MEDICATION  Place 1 each into both eyes daily as needed (allergies/  reading.). Eye Lubricate.     rivaroxaban 10  MG Tabs tablet  Commonly known as:  XARELTO  Take 1 tablet (10 mg total) by mouth daily with breakfast.     sodium chloride 0.65 % Soln nasal spray  Commonly known as:  OCEAN  Place 1-2 sprays into both nostrils as needed for congestion.     triamterene-hydrochlorothiazide 37.5-25 MG per capsule  Commonly known as:  DYAZIDE  Take 1 capsule by mouth every morning.     VISINE-A OP  Place 2 drops into both eyes daily as needed (itching eyes.).     vitamin B-12 1000 MCG tablet  Commonly known as:  CYANOCOBALAMIN  Take 1,000 mcg by mouth every morning.     Vitamin D 2000 UNITS Caps  Take 4,000 Units by mouth every morning.        Diagnostic Studies: Dg Knee Right Port  04/27/2014   CLINICAL DATA:  Status post total knee replacement  EXAM: PORTABLE RIGHT KNEE - 1-2 VIEW  COMPARISON:  Jun 14, 2013  FINDINGS: Frontal and lateral views were obtained. Patient is status post total knee replacement with femoral and tibial components appearing well-seated. No acute fracture or dislocation. There is a drain in the anterolateral aspect of the knee joint. Soft tissue air is an expected postoperative finding.  IMPRESSION: Prosthetic components appear well seated. No acute fracture or dislocation.   Electronically Signed   By: Lowella Grip III M.D.   On: 04/27/2014 11:22    Disposition: 01-Home or Self Care  Discharge Instructions    Call MD / Call 911    Complete by:  As directed   If you experience chest pain or shortness of breath, CALL 911 and be transported to the hospital emergency room.  If you develope a fever above 101 F, pus (white drainage) or increased drainage or redness at the wound, or calf pain, call your surgeon's office.     Constipation Prevention    Complete by:  As directed   Drink plenty of fluids.  Prune juice may be helpful.  You may use a stool softener, such as Colace (over the counter) 100 mg twice a day.  Use MiraLax (over the counter) for constipation as needed.      Diet - low sodium heart healthy    Complete by:  As directed      Do not put a pillow under the knee. Place it under the heel.    Complete by:  As  directed      Driving restrictions    Complete by:  As directed   No driving for 6 weeks     Increase activity slowly as tolerated    Complete by:  As directed      Lifting restrictions    Complete by:  As directed   No lifting for 6 weeks     TED hose    Complete by:  As directed   Use stockings (TED hose) for 3 weeks on both leg(s).  You may remove them at night for sleeping.           Follow-up Information    Follow up with Jasman Murri, Horald Pollen, MD. Schedule an appointment as soon as possible for a visit in 2 weeks.   Specialty:  Orthopedic Surgery   Why:  For wound re-check   Contact information:   [address] [address] [phone]        Signed: Elie Goody 04/29/2014, 7:41 AM

## 2014-04-28 NOTE — Progress Notes (Signed)
Physical Therapy Treatment Patient Details Name: Regina Hall MRN: [id number] DOB: 1934-06-13 Today's Date: 04/28/2014    History of Present Illness 80 yo female s/p R TKA 04/27/14.     PT Comments    Progressing well with mobility. Will plan to practice steps on tomorrow. Plan is for d/c home on tomorrow.   Follow Up Recommendations  Home health PT     Equipment Recommendations  None recommended by PT    Recommendations for Other Services OT consult     Precautions / Restrictions Precautions Precautions: Knee;Fall Restrictions Weight Bearing Restrictions: No RLE Weight Bearing: Weight bearing as tolerated    Mobility  Bed Mobility Overal bed mobility: Needs Assistance Bed Mobility: Sit to Supine       Sit to supine: Min assist   General bed mobility comments: Assist for R LE  Transfers Overall transfer level: Needs assistance Equipment used: Rolling walker (2 wheeled) Transfers: Sit to/from Stand Sit to Stand: Min guard         General transfer comment: close guard for safety:  cues for hand/leg placement  Ambulation/Gait Ambulation/Gait assistance: Min guard Ambulation Distance (Feet): 125 Feet Assistive device: Rolling walker (2 wheeled) Gait Pattern/deviations: Step-through pattern;Decreased stride length;Decreased step length - right;Antalgic     General Gait Details: close guard for safety.    Stairs            Wheelchair Mobility    Modified Rankin (Stroke Patients Only)       Balance                                    Cognition Arousal/Alertness: Awake/alert Behavior During Therapy: WFL for tasks assessed/performed Overall Cognitive Status: Within Functional Limits for tasks assessed                      Exercises    General Comments        Pertinent Vitals/Pain Pain Assessment: 0-10 Pain Score: 5  Pain Location: R calf, knee Pain Descriptors / Indicators: Sore;Aching Pain Intervention(s):  Monitored during session;Ice applied;Repositioned    Home Living                      Prior Function            PT Goals (current goals can now be found in the care plan section) Progress towards PT goals: Progressing toward goals    Frequency  7X/week    PT Plan Current plan remains appropriate    Co-evaluation             End of Session Equipment Utilized During Treatment: Gait belt Activity Tolerance: Patient tolerated treatment well Patient left: in bed;with call bell/phone within reach     Time: 1337-1400 PT Time Calculation (min) (ACUTE ONLY): 23 min  Charges:  $Gait Training: 8-22 mins $Therapeutic Exercise: 8-22 mins                    G Codes:      Weston Anna, MPT Pager: [phone]

## 2014-04-28 NOTE — Discharge Instructions (Signed)
°Dr. Brian Swinteck °Total Joint Specialist °[facility] °[address] °[place], [place] [address] °[phone] ° °TOTAL KNEE REPLACEMENT POSTOPERATIVE DIRECTIONS ° ° ° °Knee Rehabilitation, Guidelines Following Surgery  °Results after knee surgery are often greatly improved when you follow the exercise, range of motion and muscle strengthening exercises prescribed by your doctor. Safety measures are also important to protect the knee from further injury. Any time any of these exercises cause you to have increased pain or swelling in your knee joint, decrease the amount until you are comfortable again and slowly increase them. If you have problems or questions, call your caregiver or physical therapist for advice.  ° °WEIGHT BEARING °Weight bearing as tolerated with assist device (walker, cane, etc) as directed, use it as long as suggested by your surgeon or therapist, typically at least 4-6 weeks. ° °HOME CARE INSTRUCTIONS  °Remove items at home which could result in a fall. This includes throw rugs or furniture in walking pathways.  °Continue medications as instructed at time of discharge. °You may have some home medications which will be placed on hold until you complete the course of blood thinner medication.  °You may start showering once you are discharged home but do not submerge the incision under water. Just pat the incision dry and apply a dry gauze dressing on daily. °Walk with walker as instructed.  °You may resume a sexual relationship in one month or when given the OK by your doctor.  °· Use walker as long as suggested by your caregivers. °· Avoid periods of inactivity such as sitting longer than an hour when not asleep. This helps prevent blood clots.  °You may put full weight on your legs and walk as much as is comfortable.  °You may return to work once you are cleared by your doctor.  °Do not drive a car for 6 weeks or until released by you surgeon.  °· Do not drive while  taking narcotics.  °Wear the elastic stockings for three weeks following surgery during the day but you may remove then at night. °Make sure you keep all of your appointments after your operation with all of your doctors and caregivers. You should call the office at the above phone number and make an appointment for approximately two weeks after the date of your surgery. °Do not remove your surgical dressing. The dressing is waterproof; you may take showers in 3 days, but do not take tub baths or submerge the dressing. °Please pick up a stool softener and laxative for home use as long as you are requiring pain medications. °· ICE to the affected knee every three hours for 30 minutes at a time and then as needed for pain and swelling.  Continue to use ice on the knee for pain and swelling from surgery. You may notice swelling that will progress down to the foot and ankle.  This is normal after surgery.  Elevate the leg when you are not up walking on it.   °It is important for you to complete the blood thinner medication as prescribed by your doctor. °· Continue to use the breathing machine which will help keep your temperature down.  It is common for your temperature to cycle up and down following surgery, especially at night when you are not up moving around and exerting yourself.  The breathing machine keeps your lungs expanded and your temperature down. ° °RANGE OF MOTION AND STRENGTHENING EXERCISES  °Rehabilitation of the knee is important following a   knee injury or an operation. After just a few days of immobilization, the muscles of the thigh which control the knee become weakened and shrink (atrophy). Knee exercises are designed to build up the tone and strength of the thigh muscles and to improve knee motion. Often times heat used for twenty to thirty minutes before working out will loosen up your tissues and help with improving the range of motion but do not use heat for the first two weeks following  surgery. These exercises can be done on a training (exercise) mat, on the floor, on a table or on a bed. Use what ever works the best and is most comfortable for you Knee exercises include:  Leg Lifts - While your knee is still immobilized in a splint or cast, you can do straight leg raises. Lift the leg to 60 degrees, hold for 3 sec, and slowly lower the leg. Repeat 10-20 times 2-3 times daily. Perform this exercise against resistance later as your knee gets better.  Quad and Hamstring Sets - Tighten up the muscle on the front of the thigh (Quad) and hold for 5-10 sec. Repeat this 10-20 times hourly. Hamstring sets are done by pushing the foot backward against an object and holding for 5-10 sec. Repeat as with quad sets.  A rehabilitation program following serious knee injuries can speed recovery and prevent re-injury in the future due to weakened muscles. Contact your doctor or a physical therapist for more information on knee rehabilitation.   SKILLED REHAB INSTRUCTIONS: If the patient is transferred to a skilled rehab facility following release from the hospital, a list of the current medications will be sent to the facility for the patient to continue.  When discharged from the skilled rehab facility, please have the facility set up the patient's [place] prior to being released. Also, the skilled facility will be responsible for providing the patient with their medications at time of release from the facility to include their pain medication, the muscle relaxants, and their blood thinner medication. If the patient is still at the rehab facility at time of the two week follow up appointment, the skilled rehab facility will also need to assist the patient in arranging follow up appointment in our office and any transportation needs.  MAKE SURE YOU:  Understand these instructions.  Will watch your condition.  Will get help right away if you are not doing well or get worse.     Pick up stool softner and laxative for home use following surgery while on pain medications. Do NOT remove your dressing. You may shower.  Do not take tub baths or submerge incision under water. May shower starting three days after surgery. Please use a clean towel to pat the incision dry following showers. Continue to use ice for pain and swelling after surgery. Do not use any lotions or creams on the incision until instructed by your surgeon. Information on my medicine - XARELTO (Rivaroxaban)  This medication education was reviewed with me or my healthcare representative as part of my discharge preparation.  The pharmacist that spoke with me during my hospital stay was:  [facility]  Why was Xarelto prescribed for you? Xarelto was prescribed for you to reduce the risk of blood clots forming after orthopedic surgery. The medical term for these abnormal blood clots is venous thromboembolism (VTE).  What do you need to know about xarelto ? Take your Xarelto ONCE DAILY at the same time every day. You  may take it either with or without food.  If you have difficulty swallowing the tablet whole, you may crush it and mix in applesauce just prior to taking your dose.  Take Xarelto exactly as prescribed by your doctor and DO NOT stop taking Xarelto without talking to the doctor who prescribed the medication.  Stopping without other VTE prevention medication to take the place of Xarelto may increase your risk of developing a clot.  After discharge, you should have regular check-up appointments with your healthcare provider that is prescribing your Xarelto.    What do you do if you miss a dose? If you miss a dose, take it as soon as you remember on the same day then continue your regularly scheduled once daily regimen the next day. Do not take two doses of Xarelto on the same day.   Important Safety Information A possible side effect of Xarelto is bleeding. You should  call your healthcare provider right away if you experience any of the following: ? Bleeding from an injury or your nose that does not stop. ? Unusual colored urine (red or dark brown) or unusual colored stools (red or black). ? Unusual bruising for unknown reasons. ? A serious fall or if you hit your head (even if there is no bleeding).  Some medicines may interact with Xarelto and might increase your risk of bleeding while on Xarelto. To help avoid this, consult your healthcare provider or pharmacist prior to using any new prescription or non-prescription medications, including herbals, vitamins, non-steroidal anti-inflammatory drugs (NSAIDs) and supplements.  This website has more information on Xarelto: [email or web address]

## 2014-04-28 NOTE — Progress Notes (Signed)
Physical Therapy Treatment Patient Details Name: Regina Hall MRN: [id number] DOB: [birth year] Today's Date: 04/28/2014    History of Present Illness 80 yo female s/p R TKA 04/27/14.     PT Comments    Progressing with mobility. Pain ~4/10 with activity. Plan is for d/c home tomorrow.  Follow Up Recommendations  Home health PT     Equipment Recommendations  None recommended by PT    Recommendations for Other Services OT consult     Precautions / Restrictions Precautions Precautions: Knee;Fall Restrictions Weight Bearing Restrictions: No RLE Weight Bearing: Weight bearing as tolerated    Mobility  Bed Mobility         Supine to sit: Min assist     General bed mobility comments: OOB in recliner  Transfers Overall transfer level: Needs assistance Equipment used: Rolling walker (2 wheeled) Transfers: Sit to/from Stand Sit to Stand: Min guard         General transfer comment: close guard for safety:  cues for hand/leg placement  Ambulation/Gait Ambulation/Gait assistance: Min guard Ambulation Distance (Feet): 115 Feet Assistive device: Rolling walker (2 wheeled) Gait Pattern/deviations: Step-to pattern;Step-through pattern;Decreased stride length;Decreased step length - right;Antalgic     General Gait Details: VCs technique, seqeunce, R LE/foot positioning (R foot tends to externally rotate). close guard for safety.    Stairs            Wheelchair Mobility    Modified Rankin (Stroke Patients Only)       Balance                                    Cognition Arousal/Alertness: Awake/alert Behavior During Therapy: WFL for tasks assessed/performed Overall Cognitive Status: Within Functional Limits for tasks assessed                      Exercises Total Joint Exercises Ankle Circles/Pumps: AROM;Both;10 reps;Seated Quad Sets: AROM;Both;10 reps;Seated Hip ABduction/ADduction: AROM;Right;10 reps;Seated Straight Leg  Raises: AROM;Right;10 reps;Seated Long Arc Quad: AROM;Right;10 reps;Seated Knee Flexion: AAROM;Right;10 reps;Seated Goniometric ROM: ~10-70 degrees    General Comments        Pertinent Vitals/Pain Pain Assessment: 0-10 Pain Score: 4  Pain Location: R calf, knee Pain Descriptors / Indicators: Sore;Aching Pain Intervention(s): Monitored during session;Repositioned;Ice applied    Home Living Family/patient expects to be discharged to:: Private residence Living Arrangements: Spouse/significant other           Home Equipment: Kasandra Knudsen - single point;Walker - 2 wheels;Bedside commode      Prior Function Level of Independence: Independent          PT Goals (current goals can now be found in the care plan section) Acute Rehab PT Goals Patient Stated Goal: home soon. regain independence Progress towards PT goals: Progressing toward goals    Frequency  7X/week    PT Plan Current plan remains appropriate    Co-evaluation             End of Session Equipment Utilized During Treatment: Gait belt Activity Tolerance: Patient tolerated treatment well Patient left: in chair;with call bell/phone within reach;with family/visitor present     Time: 1002-1026 PT Time Calculation (min) (ACUTE ONLY): 24 min  Charges:  $Gait Training: 8-22 mins $Therapeutic Exercise: 8-22 mins                    G Codes:  Weston Anna, MPT Pager: [phone]

## 2014-04-28 NOTE — Progress Notes (Signed)
   Subjective:  Patient reports pain as mild.  No c/o. Denies N/V/CP/SOB.  Objective:   VITALS:   Filed Vitals:   04/27/14 1846 04/27/14 2142 04/28/14 0213 04/28/14 0603  BP: 141/70 147/49 118/85 127/58  Pulse: 80 75 79 68  Temp: 97.7 F (36.5 C) 97.8 F (36.6 C) 97.6 F (36.4 C) 98.2 F (36.8 C)  TempSrc: Oral Oral Oral Oral  Resp: 16 16 16 16   Height:      Weight:      SpO2: 99% 98% 98% 97%    ABD soft Sensation intact distally Intact pulses distally Dorsiflexion/Plantar flexion intact Incision: dressing C/D/I Compartment soft HV ss  Lab Results  Component Value Date   WBC 12.3* 04/28/2014   HGB 11.2* 04/28/2014   HCT 33.3* 04/28/2014   MCV 95.7 04/28/2014   PLT 163 04/28/2014   BMET    Component Value Date/Time   NA 140 04/28/2014 0440   K 4.6 04/28/2014 0440   CL 107 04/28/2014 0440   CO2 26 04/28/2014 0440   GLUCOSE 155* 04/28/2014 0440   BUN 28* 04/28/2014 0440   CREATININE 1.17* 04/28/2014 0440   CALCIUM 8.6 04/28/2014 0440   GFRNONAA 44* 04/28/2014 0440   GFRAA 51* 04/28/2014 0440     Assessment/Plan: 1 Day Post-Op   Principal Problem:   Primary osteoarthritis of right knee   WBAT with walker PT/OT Xarelto, Teds, foot pumps PO pain control Advance diet Up with therapy D/C IV fluids Plan for discharge tomorrow Discharge home with home health    Rahn Lacuesta, Horald Pollen 04/28/2014, 7:15 AM   Rod Can, MD Cell [phone]

## 2014-04-28 NOTE — Evaluation (Signed)
Occupational Therapy Evaluation Patient Details Name: Regina Hall MRN: [id number] DOB: [birth year] Today's Date: 04/28/2014    History of Present Illness 80 yo female s/p R TKA 04/27/14.    Clinical Impression   Pt was admitted for the above surgery. She will benefit from skilled OT in acute to continue education for safe bathroom transfers as well as use of AE for ADLs.  Pt was independent prior to admission.  Goals are for min guard overall.     Follow Up Recommendations  No OT follow up;Supervision/Assistance - 24 hour    Equipment Recommendations  None recommended by OT    Recommendations for Other Services       Precautions / Restrictions Precautions Precautions: Fall;Knee Restrictions Weight Bearing Restrictions: No RLE Weight Bearing: Weight bearing as tolerated      Mobility Bed Mobility         Supine to sit: Min assist     General bed mobility comments: assist for RLE.  HOB raised  Transfers   Equipment used: Rolling walker (2 wheeled) Transfers: Sit to/from Stand Sit to Stand: Min guard         General transfer comment: close guard for safety:  cues for hand/leg placement    Balance                                            ADL Overall ADL's : Needs assistance/impaired     Grooming: Wash/dry hands;Wash/dry face;Sitting   Upper Body Bathing: Set up;Sitting   Lower Body Bathing: Moderate assistance;Sit to/from stand   Upper Body Dressing : Minimal assistance;Sitting (iv)   Lower Body Dressing: Maximal assistance;Sit to/from stand   Toilet Transfer: Minimal assistance;Stand-pivot (to recliner)   Toileting- Clothing Manipulation and Hygiene: Min guard;Bed level         General ADL Comments: Performed ADL from EOB.  Pt used to have a long sponge:  will get net on stick.  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  Will practice on next visit.  Pt is very talkative and  needed cues back to task  Pt verbalizes no pillow under knee.  Cues for safety given during sit to stand during ADLs     Vision     Perception     Praxis      Pertinent Vitals/Pain Pain Assessment: 0-10 Pain Score: 2  Pain Location: R knee Pain Descriptors / Indicators: Sore Pain Intervention(s): Limited activity within patient's tolerance;Monitored during session;Repositioned;Ice applied;Premedicated before session     Hand Dominance     Extremity/Trunk Assessment Upper Extremity Assessment Upper Extremity Assessment: LUE deficits/detail ((non dominant)) LUE Deficits / Details: painful L shoulder:  WFLs for ADLs           Communication Communication Communication: No difficulties   Cognition Arousal/Alertness: Awake/alert Behavior During Therapy: WFL for tasks assessed/performed Overall Cognitive Status: Within Functional Limits for tasks assessed                     General Comments       Exercises       Shoulder Instructions      Home Living Family/patient expects to be discharged to:: Private residence Living Arrangements: Spouse/significant other                 Bathroom Shower/Tub: Gaffer  Bathroom Toilet: Standard     Home Equipment: Cane - single point;Walker - 2 wheels;Bedside commode          Prior Functioning/Environment Level of Independence: Independent             OT Diagnosis: Generalized weakness   OT Problem List: Decreased strength;Decreased activity tolerance;Decreased knowledge of use of DME or AE;Pain;Decreased safety awareness   OT Treatment/Interventions: Self-care/ADL training;DME and/or AE instruction;Patient/family education    OT Goals(Current goals can be found in the care plan section) Acute Rehab OT Goals Patient Stated Goal: home soon. regain independence OT Goal Formulation: With patient Time For Goal Achievement: 05/05/14 Potential to Achieve Goals: Good ADL Goals Pt Will Transfer  to Toilet: with min guard assist;ambulating;bedside commode Pt Will Perform Toileting - Clothing Manipulation and hygiene: with supervision;sit to/from stand Pt Will Perform Tub/Shower Transfer: Shower transfer;with min guard assist;3 in 1;ambulating Additional ADL Goal #1: Pt will use AE with supervision/cues for LB adls  OT Frequency: Min 2X/week   Barriers to D/C:            Co-evaluation              End of Session    Activity Tolerance: Patient tolerated treatment well Patient left: in chair;with call bell/phone within reach   Time: 7893-8101 OT Time Calculation (min): 28 min Charges:  OT General Charges $OT Visit: 1 Procedure OT Evaluation $Initial OT Evaluation Tier I: 1 Procedure OT Treatments $Self Care/Home Management : 8-22 mins G-Codes:    Lucila Klecka 2014-05-07, 8:54 AM  Lesle Chris, OTR/L [phone] May 07, 2014

## 2014-04-28 NOTE — Care Management Note (Signed)
    Page 1 of 1   04/28/2014     10:27:44 AM CARE MANAGEMENT NOTE 04/28/2014  Patient:  Regina Hall, Regina Hall   Account Number:  [id number]  Date Initiated:  04/28/2014  Documentation initiated by:  Sunday Spillers  Subjective/Objective Assessment:   80 yo female admitted s/p Right total knee arthroplasty     Action/Plan:   Discharge planning   Anticipated DC Date:  04/29/2014   Anticipated DC Plan:  [place]  CM consult      [facility] Choice  HOME HEALTH   Choice offered to / List presented to:  C-1 Patient        [place] arranged  HH-2 PT      [place]   Status of service:  Completed, signed off Medicare Important Message given?   (If response is "NO", the following Medicare IM given date fields will be blank) Date Medicare IM given:   Medicare IM given by:   Date Additional Medicare IM given:   Additional Medicare IM given by:    Discharge Disposition:  [place]  Per UR Regulation:  Reviewed for med. necessity/level of care/duration of stay  If discussed at [place] of Stay Meetings, dates discussed:    Comments:  04-28-14 Sunday Spillers RN CM 1000 Spoke with patient at bedside. Plans to use [facility] for [facility] PT. Has all needed DME. Anticipate d/c in am. Contacted Tim with [facility] for referral.

## 2014-04-29 LAB — CBC
HEMATOCRIT: 33.9 % — AB (ref 36.0–46.0)
Hemoglobin: 11.2 g/dL — ABNORMAL LOW (ref 12.0–15.0)
MCH: 31.7 pg (ref 26.0–34.0)
MCHC: 33 g/dL (ref 30.0–36.0)
MCV: 96 fL (ref 78.0–100.0)
Platelets: 153 10*3/uL (ref 150–400)
RBC: 3.53 MIL/uL — ABNORMAL LOW (ref 3.87–5.11)
RDW: 13.3 % (ref 11.5–15.5)
WBC: 11 10*3/uL — ABNORMAL HIGH (ref 4.0–10.5)

## 2014-04-29 NOTE — Progress Notes (Signed)
Occupational Therapy Treatment Patient Details Name: Regina Hall MRN: [id number] DOB: Jun 14, 1934 Today's Date: 04/29/2014    History of present illness 80 yo female s/p R TKA 04/27/14.    OT comments  Pt is making gains in OT.  Needs reinforcement for safety:  Pt tends to talk a lot and distract herself.  She was unable to clear ledge for shower stall today  Follow Up Recommendations  Home health OT;Supervision/Assistance - 24 hour    Equipment Recommendations  None recommended by OT    Recommendations for Other Services      Precautions / Restrictions Precautions Precautions: Knee;Fall       Mobility Bed Mobility   Bed Mobility: Supine to Sit       Sit to supine: Min assist   General bed mobility comments: assist for RLE.  HOB raised slightly.  Pt thinks she will sleep on her lounge chair  Transfers   Equipment used: Rolling walker (2 wheeled) Transfers: Sit to/from Stand Sit to Stand: Min guard         General transfer comment: close guard for safety:  cues for hand/leg placement    Balance                                   ADL Overall ADL's : Needs assistance/impaired                         Toilet Transfer: Min guard;Ambulation;BSC   Toileting- Clothing Manipulation and Hygiene: Set up;Sitting/lateral lean         General ADL Comments: attempted shower transfer: pt unable to clear RLE over ledge.  Ambulated with min guard and cues for walker distance for safety.  Pt talks a lot and distracts herself      Vision                     Perception     Praxis      Cognition   Behavior During Therapy: [facility] for tasks assessed/performed Overall Cognitive Status: Within Functional Limits for tasks assessed                       Extremity/Trunk Assessment               Exercises     Shoulder Instructions       General Comments      Pertinent Vitals/ Pain       Pain Score: 4  Pain  Location: R knee Pain Descriptors / Indicators: Sore Pain Intervention(s): Limited activity within patient's tolerance;Monitored during session;Premedicated before session;Repositioned  Home Living                                          Prior Functioning/Environment              Frequency Min 2X/week     Progress Toward Goals  OT Goals(current goals can now be found in the care plan section)  Progress towards OT goals: Progressing toward goals     Plan Discharge plan needs to be updated    Co-evaluation                 End of Session     Activity Tolerance Patient tolerated treatment  well   Patient Left in bed;with call bell/phone within reach;with nursing/sitter in room   Nurse Communication          Time: [id number] OT Time Calculation (min): 18 min  Charges: OT General Charges $OT Visit: 1 Procedure OT Treatments $Self Care/Home Management : 8-22 mins  Tyra Gural 04/29/2014, 9:32 AM  Lesle Chris, OTR/L [phone] 04/29/2014

## 2014-04-29 NOTE — Progress Notes (Signed)
   Subjective:  Patient reports pain as mild.  No c/o. Denies N/V/CP/SOB.  Objective:   VITALS:   Filed Vitals:   04/28/14 0603 04/28/14 1359 04/28/14 2127 04/29/14 0526  BP: 127/58 156/64 147/66 137/67  Pulse: 68 77 88 82  Temp: 98.2 F (36.8 C) 97.8 F (36.6 C) 98.7 F (37.1 C) 98.2 F (36.8 C)  TempSrc: Oral Oral Oral Oral  Resp: 16 16 16 16   Height:      Weight:      SpO2: 97% 100% 98% 97%    ABD soft Sensation intact distally Intact pulses distally Dorsiflexion/Plantar flexion intact Incision: dressing C/D/I Compartment soft   Lab Results  Component Value Date   WBC 11.0* 04/29/2014   HGB 11.2* 04/29/2014   HCT 33.9* 04/29/2014   MCV 96.0 04/29/2014   PLT 153 04/29/2014   BMET    Component Value Date/Time   NA 140 04/28/2014 0440   K 4.6 04/28/2014 0440   CL 107 04/28/2014 0440   CO2 26 04/28/2014 0440   GLUCOSE 155* 04/28/2014 0440   BUN 28* 04/28/2014 0440   CREATININE 1.17* 04/28/2014 0440   CALCIUM 8.6 04/28/2014 0440   GFRNONAA 44* 04/28/2014 0440   GFRAA 51* 04/28/2014 0440     Assessment/Plan: 2 Days Post-Op   Principal Problem:   Primary osteoarthritis of right knee   WBAT with walker PT/OT Xarelto, Teds, foot pumps PO pain control Up with therapy Discharge home with home health    Alberto Schoch, Horald Pollen 04/29/2014, 7:42 AM   Rod Can, MD Cell [phone]

## 2014-04-29 NOTE — Progress Notes (Signed)
RN reviewed discharge instructions with patient and family. All questions answered.  Paperwork and prescriptions given.   NT rolled patient down in wheelchair to family car.  

## 2014-04-29 NOTE — Progress Notes (Signed)
Physical Therapy Treatment Patient Details Name: TENIQUA MARRON MRN: [id number] DOB: 09-19-1934 Today's Date: 04/29/2014    History of Present Illness 80 yo female s/p R TKA 04/27/14.     PT Comments    Progressing with mobility. Practiced ambulation, exercises, and stair negotiation. All education completed. Ready to d/c from PT standpoint.   Follow Up Recommendations  Home health PT     Equipment Recommendations  None recommended by PT    Recommendations for Other Services OT consult     Precautions / Restrictions Precautions Precautions: Knee;Fall Restrictions Weight Bearing Restrictions: No RLE Weight Bearing: Weight bearing as tolerated    Mobility  Bed Mobility Overal bed mobility: Needs Assistance Bed Mobility: Supine to Sit     Supine to sit: Min guard Sit to supine: Min assist   General bed mobility comments: close guard for safety  Transfers Overall transfer level: Needs assistance Equipment used: Rolling walker (2 wheeled) Transfers: Sit to/from Stand Sit to Stand: Min guard         General transfer comment: close guard for safety:  cues for hand/leg placement  Ambulation/Gait Ambulation/Gait assistance: Min guard Ambulation Distance (Feet): 125 Feet Assistive device: Rolling walker (2 wheeled) Gait Pattern/deviations: Step-through pattern;Decreased stride length;Decreased step length - left;Antalgic;Trunk flexed     General Gait Details: close guard for safety.    Stairs Stairs: Yes Stairs assistance: Min assist Stair Management: Step to pattern;Forwards;One rail Right Number of Stairs: 2 General stair comments: VCs safety, technique, sequence. Assist to stabilize. Pt stated she could reach deck railing and use it for support (no handrail available)-simulated this during session.   Wheelchair Mobility    Modified Rankin (Stroke Patients Only)       Balance                                    Cognition  Arousal/Alertness: Awake/alert Behavior During Therapy: WFL for tasks assessed/performed Overall Cognitive Status: Within Functional Limits for tasks assessed                      Exercises Total Joint Exercises Ankle Circles/Pumps: AROM;Both;10 reps;Supine Quad Sets: AROM;Both;10 reps;Supine Heel Slides: AAROM;Right;10 reps;Supine Hip ABduction/ADduction: AAROM;Right;10 reps;Supine Straight Leg Raises: AAROM;Right;AROM;10 reps;Supine Goniometric ROM: ~5-60 degrees (supine)    General Comments        Pertinent Vitals/Pain Pain Assessment: 0-10 Pain Score: 5  Pain Location: R knee Pain Descriptors / Indicators: Sore Pain Intervention(s): Monitored during session;Ice applied;Repositioned    Home Living                      Prior Function            PT Goals (current goals can now be found in the care plan section) Progress towards PT goals: Progressing toward goals    Frequency  7X/week    PT Plan Current plan remains appropriate    Co-evaluation             End of Session Equipment Utilized During Treatment: Gait belt Activity Tolerance: Patient tolerated treatment well Patient left: in chair;with call bell/phone within reach     Time: 0935-0955 PT Time Calculation (min) (ACUTE ONLY): 20 min  Charges:  $Gait Training: 8-22 mins                    G Codes:  Weston Anna, MPT Pager: [phone]

## 2015-02-03 ENCOUNTER — Other Ambulatory Visit (HOSPITAL_COMMUNITY): Payer: Self-pay | Admitting: Neurological Surgery

## 2015-02-03 DIAGNOSIS — M4156 Other secondary scoliosis, lumbar region: Secondary | ICD-10-CM

## 2015-02-17 ENCOUNTER — Ambulatory Visit (HOSPITAL_COMMUNITY)
Admission: RE | Admit: 2015-02-17 | Discharge: 2015-02-17 | Disposition: A | Discharge status: Home / Self Care | Payer: Medicare Other | Source: Ambulatory Visit | Attending: Neurological Surgery | Admitting: Neurological Surgery

## 2015-02-17 DIAGNOSIS — M5136 Other intervertebral disc degeneration, lumbar region: Secondary | ICD-10-CM | POA: Insufficient documentation

## 2015-02-17 DIAGNOSIS — M4156 Other secondary scoliosis, lumbar region: Secondary | ICD-10-CM | POA: Diagnosis present

## 2015-02-17 DIAGNOSIS — M4185 Other forms of scoliosis, thoracolumbar region: Secondary | ICD-10-CM | POA: Diagnosis not present

## 2015-03-01 ENCOUNTER — Other Ambulatory Visit: Payer: Self-pay | Admitting: Neurological Surgery

## 2015-03-21 ENCOUNTER — Encounter (HOSPITAL_COMMUNITY): Payer: Self-pay

## 2015-03-21 ENCOUNTER — Encounter (HOSPITAL_COMMUNITY)
Admission: RE | Admit: 2015-03-21 | Discharge: 2015-03-21 | Disposition: A | Discharge status: Home / Self Care | Payer: Medicare Other | Source: Ambulatory Visit | Attending: Neurological Surgery | Admitting: Neurological Surgery

## 2015-03-21 DIAGNOSIS — M4310 Spondylolisthesis, site unspecified: Secondary | ICD-10-CM | POA: Insufficient documentation

## 2015-03-21 DIAGNOSIS — Z96651 Presence of right artificial knee joint: Secondary | ICD-10-CM | POA: Diagnosis not present

## 2015-03-21 DIAGNOSIS — R32 Unspecified urinary incontinence: Secondary | ICD-10-CM | POA: Diagnosis not present

## 2015-03-21 DIAGNOSIS — Z01812 Encounter for preprocedural laboratory examination: Secondary | ICD-10-CM | POA: Insufficient documentation

## 2015-03-21 DIAGNOSIS — Z01818 Encounter for other preprocedural examination: Secondary | ICD-10-CM | POA: Insufficient documentation

## 2015-03-21 DIAGNOSIS — M419 Scoliosis, unspecified: Secondary | ICD-10-CM | POA: Insufficient documentation

## 2015-03-21 DIAGNOSIS — Z0183 Encounter for blood typing: Secondary | ICD-10-CM | POA: Insufficient documentation

## 2015-03-21 DIAGNOSIS — I10 Essential (primary) hypertension: Secondary | ICD-10-CM | POA: Insufficient documentation

## 2015-03-21 DIAGNOSIS — Z79899 Other long term (current) drug therapy: Secondary | ICD-10-CM | POA: Insufficient documentation

## 2015-03-21 HISTORY — DX: Anxiety disorder, unspecified: F41.9

## 2015-03-21 LAB — CBC
HEMATOCRIT: 42.7 % (ref 36.0–46.0)
Hemoglobin: 14.5 g/dL (ref 12.0–15.0)
MCH: 32.1 pg (ref 26.0–34.0)
MCHC: 34 g/dL (ref 30.0–36.0)
MCV: 94.5 fL (ref 78.0–100.0)
PLATELETS: 168 10*3/uL (ref 150–400)
RBC: 4.52 MIL/uL (ref 3.87–5.11)
RDW: 13.1 % (ref 11.5–15.5)
WBC: 7.1 10*3/uL (ref 4.0–10.5)

## 2015-03-21 LAB — BASIC METABOLIC PANEL
ANION GAP: 12 (ref 5–15)
BUN: 21 mg/dL — ABNORMAL HIGH (ref 6–20)
CALCIUM: 10.1 mg/dL (ref 8.9–10.3)
CO2: 24 mmol/L (ref 22–32)
Chloride: 104 mmol/L (ref 101–111)
Creatinine, Ser: 1.2 mg/dL — ABNORMAL HIGH (ref 0.44–1.00)
GFR, EST AFRICAN AMERICAN: 49 mL/min — AB (ref >60)
GFR, EST NON AFRICAN AMERICAN: 42 mL/min — AB (ref >60)
GLUCOSE: 109 mg/dL — AB (ref 65–99)
POTASSIUM: 4.9 mmol/L (ref 3.5–5.1)
Sodium: 140 mmol/L (ref 135–145)

## 2015-03-21 LAB — TYPE AND SCREEN
ABO/RH(D): B POS
ANTIBODY SCREEN: NEGATIVE

## 2015-03-21 LAB — SURGICAL PCR SCREEN
MRSA, PCR: NEGATIVE
STAPHYLOCOCCUS AUREUS: NEGATIVE

## 2015-03-21 NOTE — Pre-Procedure Instructions (Signed)
    IMONIE SPELL  03/21/2015      [facility] - [place], [place] [address] [place] [address] Phone: [phone] Fax: [phone]    Your procedure is scheduled on 03/28/15.  Report to [facility] Admitting at 6 A.M.  Call this number if you have problems the morning of surgery:  [phone]   Remember:  Do not eat food or drink liquids after midnight.  Take these medicines the morning of surgery with A SIP OF WATER -hydrocodone   Do not wear jewelry, make-up or nail polish.  Do not wear lotions, powders, or perfumes.  You may wear deodorant.  Do not shave 48 hours prior to surgery.  Men may shave face and neck.  Do not bring valuables to the hospital.  [facility] is not responsible for any belongings or valuables.  Contacts, dentures or bridgework may not be worn into surgery.  Leave your suitcase in the car.  After surgery it may be brought to your room.  For patients admitted to the hospital, discharge time will be determined by your treatment team.  Patients discharged the day of surgery will not be allowed to drive home.   Name and phone number of your driver:    Special instructions:    Please read over the following fact sheets that you were given. Pain Booklet, Coughing and Deep Breathing, Blood Transfusion Information, MRSA Information and Surgical Site Infection Prevention

## 2015-03-22 NOTE — Progress Notes (Signed)
Anesthesia Chart Review: Patient is a 81 year old female scheduled for L4-5 PLIF on 03/28/15 by Dr. Ellene Route.   History includes non-smoker, post-operative N/V, HTN, anemia, arthritis, scoliosis, bladder incontinence, anxiety, hysterectomy, right TKA 04/27/14, L4-5 laminectomy '13. BMI is consistent with obesity. PCP is listed as Dr. Matthias Hughs.  Meds include Norco, losartan, fish oil, Zofran, triamterene-HCTZ, oxymetazoline, Cognitex supplement, cod liver oil.  03/21/15 EKG: SR, PAC's in a pattern of bigeminy, LVH. No significant change when compared to 04/21/14/ tracing.  Preoperative labs noted.   If no acute changes then I anticipate that she can proceed as planned.  George Hugh [facility] Short Stay Center/Anesthesiology Phone [phone] 03/22/2015 5:31 PM

## 2015-03-27 MED ORDER — VANCOMYCIN HCL IN DEXTROSE 1-5 GM/200ML-% IV SOLN
1000.0000 mg | INTRAVENOUS | Status: AC
  Administered 2015-03-28: 1000 mg via INTRAVENOUS
  Filled 2015-03-27: qty 200

## 2015-03-27 MED ORDER — VANCOMYCIN HCL IN DEXTROSE 1-5 GM/200ML-% IV SOLN: 1000.0000 mg | INTRAVENOUS | Status: DC

## 2015-03-28 ENCOUNTER — Encounter (HOSPITAL_COMMUNITY)
Admission: RE | Disposition: A | Discharge status: Home / Self Care | Payer: Self-pay | Source: Ambulatory Visit | Attending: Neurological Surgery

## 2015-03-28 ENCOUNTER — Inpatient Hospital Stay (HOSPITAL_COMMUNITY): Payer: Medicare Other

## 2015-03-28 ENCOUNTER — Inpatient Hospital Stay (HOSPITAL_COMMUNITY): Payer: Medicare Other | Admitting: Critical Care Medicine

## 2015-03-28 ENCOUNTER — Inpatient Hospital Stay (HOSPITAL_COMMUNITY)
Admission: RE | Admit: 2015-03-28 | Discharge: 2015-03-31 | DRG: 460 | Disposition: A | Discharge status: Home / Self Care | Payer: Medicare Other | Source: Ambulatory Visit | Attending: Neurological Surgery | Admitting: Neurological Surgery

## 2015-03-28 ENCOUNTER — Encounter (HOSPITAL_COMMUNITY): Payer: Self-pay | Admitting: Surgery

## 2015-03-28 ENCOUNTER — Inpatient Hospital Stay (HOSPITAL_COMMUNITY): Payer: Medicare Other | Admitting: Vascular Surgery

## 2015-03-28 DIAGNOSIS — M549 Dorsalgia, unspecified: Secondary | ICD-10-CM | POA: Diagnosis present

## 2015-03-28 DIAGNOSIS — Z419 Encounter for procedure for purposes other than remedying health state, unspecified: Secondary | ICD-10-CM

## 2015-03-28 DIAGNOSIS — Z79899 Other long term (current) drug therapy: Secondary | ICD-10-CM

## 2015-03-28 DIAGNOSIS — F419 Anxiety disorder, unspecified: Secondary | ICD-10-CM | POA: Diagnosis present

## 2015-03-28 DIAGNOSIS — M4186 Other forms of scoliosis, lumbar region: Secondary | ICD-10-CM | POA: Diagnosis present

## 2015-03-28 DIAGNOSIS — M419 Scoliosis, unspecified: Secondary | ICD-10-CM | POA: Diagnosis present

## 2015-03-28 DIAGNOSIS — Z96651 Presence of right artificial knee joint: Secondary | ICD-10-CM | POA: Diagnosis present

## 2015-03-28 DIAGNOSIS — Z96642 Presence of left artificial hip joint: Secondary | ICD-10-CM | POA: Diagnosis present

## 2015-03-28 DIAGNOSIS — Z7901 Long term (current) use of anticoagulants: Secondary | ICD-10-CM | POA: Diagnosis not present

## 2015-03-28 DIAGNOSIS — M4806 Spinal stenosis, lumbar region: Principal | ICD-10-CM | POA: Diagnosis present

## 2015-03-28 DIAGNOSIS — Z880 Allergy status to penicillin: Secondary | ICD-10-CM | POA: Diagnosis not present

## 2015-03-28 DIAGNOSIS — I10 Essential (primary) hypertension: Secondary | ICD-10-CM | POA: Diagnosis present

## 2015-03-28 SURGERY — POSTERIOR LUMBAR FUSION 1 LEVEL
Anesthesia: General | Site: Back

## 2015-03-28 MED ORDER — ALUM & MAG HYDROXIDE-SIMETH 200-200-20 MG/5ML PO SUSP: 30.0000 mL | Freq: Four times a day (QID) | ORAL | Status: DC | PRN

## 2015-03-28 MED ORDER — TRIAMTERENE-HCTZ 37.5-25 MG PO CAPS
1.0000 | ORAL_CAPSULE | Freq: Every morning | ORAL | Status: DC
  Administered 2015-03-29 – 2015-03-31 (×3): 1 via ORAL
  Filled 2015-03-28 (×9): qty 1

## 2015-03-28 MED ORDER — FENTANYL CITRATE (PF) 100 MCG/2ML IJ SOLN
INTRAMUSCULAR | Status: DC | PRN
  Administered 2015-03-28: 100 ug via INTRAVENOUS
  Administered 2015-03-28 (×2): 50 ug via INTRAVENOUS

## 2015-03-28 MED ORDER — ONDANSETRON HCL 4 MG/2ML IJ SOLN
INTRAMUSCULAR | Status: DC | PRN
  Administered 2015-03-28: 4 mg via INTRAVENOUS

## 2015-03-28 MED ORDER — MEPERIDINE HCL 25 MG/ML IJ SOLN: 6.2500 mg | INTRAMUSCULAR | Status: DC | PRN

## 2015-03-28 MED ORDER — 0.9 % SODIUM CHLORIDE (POUR BTL) OPTIME
TOPICAL | Status: DC | PRN
  Administered 2015-03-28: 1000 mL

## 2015-03-28 MED ORDER — FENTANYL CITRATE (PF) 250 MCG/5ML IJ SOLN
INTRAMUSCULAR | Status: AC
  Filled 2015-03-28: qty 5

## 2015-03-28 MED ORDER — ONDANSETRON HCL 4 MG/2ML IJ SOLN: 4.0000 mg | INTRAMUSCULAR | Status: DC | PRN

## 2015-03-28 MED ORDER — LIDOCAINE-EPINEPHRINE 1 %-1:100000 IJ SOLN
INTRAMUSCULAR | Status: DC | PRN
  Administered 2015-03-28: 10 mL

## 2015-03-28 MED ORDER — HYDROMORPHONE HCL 1 MG/ML IJ SOLN
INTRAMUSCULAR | Status: AC
Start: 2015-03-28 — End: 2015-03-29
  Filled 2015-03-28: qty 1

## 2015-03-28 MED ORDER — LOSARTAN POTASSIUM 50 MG PO TABS
25.0000 mg | ORAL_TABLET | Freq: Every morning | ORAL | Status: DC
  Administered 2015-03-29 – 2015-03-31 (×3): 25 mg via ORAL
  Filled 2015-03-28 (×3): qty 1

## 2015-03-28 MED ORDER — OXYCODONE-ACETAMINOPHEN 5-325 MG PO TABS
1.0000 | ORAL_TABLET | ORAL | Status: DC | PRN
  Administered 2015-03-29: 1 via ORAL
  Administered 2015-03-29 – 2015-03-30 (×3): 2 via ORAL
  Filled 2015-03-28 (×3): qty 2
  Filled 2015-03-28: qty 1

## 2015-03-28 MED ORDER — VANCOMYCIN HCL 1000 MG IV SOLR
INTRAVENOUS | Status: AC
  Filled 2015-03-28: qty 1000

## 2015-03-28 MED ORDER — MORPHINE SULFATE (PF) 2 MG/ML IV SOLN: 1.0000 mg | INTRAVENOUS | Status: DC | PRN

## 2015-03-28 MED ORDER — SODIUM CHLORIDE 0.9 % IV SOLN
10000.0000 ug | INTRAVENOUS | Status: DC | PRN
  Administered 2015-03-28: 25 ug/min via INTRAVENOUS

## 2015-03-28 MED ORDER — ROCURONIUM BROMIDE 100 MG/10ML IV SOLN
INTRAVENOUS | Status: DC | PRN
  Administered 2015-03-28: 50 mg via INTRAVENOUS
  Administered 2015-03-28 (×3): 10 mg via INTRAVENOUS

## 2015-03-28 MED ORDER — CARBOXYMETHYLCELLULOSE SODIUM 0.25 % OP SOLN: OPHTHALMIC | Status: DC | PRN

## 2015-03-28 MED ORDER — PHENOL 1.4 % MT LIQD: 1.0000 | OROMUCOSAL | Status: DC | PRN

## 2015-03-28 MED ORDER — ACETAMINOPHEN 325 MG PO TABS: 650.0000 mg | ORAL_TABLET | ORAL | Status: DC | PRN

## 2015-03-28 MED ORDER — METHOCARBAMOL 500 MG PO TABS
500.0000 mg | ORAL_TABLET | Freq: Four times a day (QID) | ORAL | Status: DC | PRN
  Administered 2015-03-29: 500 mg via ORAL
  Filled 2015-03-28: qty 1

## 2015-03-28 MED ORDER — LACTATED RINGERS IV SOLN: INTRAVENOUS | Status: DC

## 2015-03-28 MED ORDER — SODIUM CHLORIDE 0.9 % IV SOLN: 250.0000 mL | INTRAVENOUS | Status: DC

## 2015-03-28 MED ORDER — HYDROCODONE-ACETAMINOPHEN 5-325 MG PO TABS
1.0000 | ORAL_TABLET | ORAL | Status: DC | PRN
  Administered 2015-03-31: 2 via ORAL
  Filled 2015-03-28: qty 2

## 2015-03-28 MED ORDER — METHOCARBAMOL 1000 MG/10ML IJ SOLN
500.0000 mg | Freq: Four times a day (QID) | INTRAVENOUS | Status: DC | PRN
  Filled 2015-03-28: qty 5

## 2015-03-28 MED ORDER — LIDOCAINE HCL (CARDIAC) 20 MG/ML IV SOLN
INTRAVENOUS | Status: DC | PRN
  Administered 2015-03-28: 80 mg via INTRAVENOUS

## 2015-03-28 MED ORDER — SUGAMMADEX SODIUM 200 MG/2ML IV SOLN
INTRAVENOUS | Status: DC | PRN
  Administered 2015-03-28: 200 mg via INTRAVENOUS

## 2015-03-28 MED ORDER — KETOROLAC TROMETHAMINE 15 MG/ML IJ SOLN
INTRAMUSCULAR | Status: AC
  Filled 2015-03-28: qty 1

## 2015-03-28 MED ORDER — HYDROCODONE-ACETAMINOPHEN 10-325 MG PO TABS: 0.5000 | ORAL_TABLET | Freq: Every day | ORAL | Status: DC | PRN

## 2015-03-28 MED ORDER — SODIUM CHLORIDE 0.9 % IR SOLN
Status: DC | PRN
  Administered 2015-03-28: 11:00:00

## 2015-03-28 MED ORDER — PROMETHAZINE HCL 25 MG/ML IJ SOLN: 6.2500 mg | INTRAMUSCULAR | Status: DC | PRN

## 2015-03-28 MED ORDER — ACETAMINOPHEN 650 MG RE SUPP: 650.0000 mg | RECTAL | Status: DC | PRN

## 2015-03-28 MED ORDER — MENTHOL 3 MG MT LOZG
1.0000 | LOZENGE | OROMUCOSAL | Status: DC | PRN
  Filled 2015-03-28: qty 9

## 2015-03-28 MED ORDER — BUPIVACAINE HCL (PF) 0.5 % IJ SOLN
INTRAMUSCULAR | Status: DC | PRN
  Administered 2015-03-28: 14 mL
  Administered 2015-03-28: 10 mL

## 2015-03-28 MED ORDER — ARTIFICIAL TEARS OP OINT
TOPICAL_OINTMENT | OPHTHALMIC | Status: DC | PRN
  Administered 2015-03-28: 1 via OPHTHALMIC

## 2015-03-28 MED ORDER — HYPROMELLOSE (GONIOSCOPIC) 2.5 % OP SOLN
1.0000 [drp] | OPHTHALMIC | Status: DC | PRN
  Filled 2015-03-28: qty 15

## 2015-03-28 MED ORDER — SODIUM CHLORIDE 0.9% FLUSH
3.0000 mL | Freq: Two times a day (BID) | INTRAVENOUS | Status: DC
  Administered 2015-03-29: 3 mL via INTRAVENOUS

## 2015-03-28 MED ORDER — OXYMETAZOLINE HCL 0.05 % NA SOLN
1.0000 | NASAL | Status: DC | PRN
  Filled 2015-03-28: qty 15

## 2015-03-28 MED ORDER — FENTANYL CITRATE (PF) 100 MCG/2ML IJ SOLN
INTRAMUSCULAR | Status: AC
  Filled 2015-03-28: qty 2

## 2015-03-28 MED ORDER — SODIUM CHLORIDE 0.9 % IV SOLN: INTRAVENOUS | Status: DC

## 2015-03-28 MED ORDER — FENTANYL CITRATE (PF) 100 MCG/2ML IJ SOLN
25.0000 ug | INTRAMUSCULAR | Status: DC | PRN
  Administered 2015-03-28 (×2): 50 ug via INTRAVENOUS

## 2015-03-28 MED ORDER — THROMBIN 20000 UNITS EX SOLR
CUTANEOUS | Status: DC | PRN
  Administered 2015-03-28: 11:00:00 via TOPICAL

## 2015-03-28 MED ORDER — PHENYLEPHRINE HCL 10 MG/ML IJ SOLN
INTRAMUSCULAR | Status: DC | PRN
  Administered 2015-03-28 (×2): 40 ug via INTRAVENOUS

## 2015-03-28 MED ORDER — VANCOMYCIN HCL IN DEXTROSE 1-5 GM/200ML-% IV SOLN
1000.0000 mg | Freq: Once | INTRAVENOUS | Status: AC
  Administered 2015-03-28: 1000 mg via INTRAVENOUS
  Filled 2015-03-28: qty 200

## 2015-03-28 MED ORDER — ACETAMINOPHEN 10 MG/ML IV SOLN
INTRAVENOUS | Status: AC
  Administered 2015-03-28: 1000 mg via INTRAVENOUS
  Filled 2015-03-28: qty 100

## 2015-03-28 MED ORDER — PROPOFOL 10 MG/ML IV BOLUS
INTRAVENOUS | Status: DC | PRN
  Administered 2015-03-28: 150 mg via INTRAVENOUS

## 2015-03-28 MED ORDER — KETOROLAC TROMETHAMINE 15 MG/ML IJ SOLN
15.0000 mg | Freq: Four times a day (QID) | INTRAMUSCULAR | Status: AC
  Administered 2015-03-28 – 2015-03-29 (×5): 15 mg via INTRAVENOUS
  Filled 2015-03-28 (×4): qty 1

## 2015-03-28 MED ORDER — THROMBIN 5000 UNITS EX SOLR
OROMUCOSAL | Status: DC | PRN
  Administered 2015-03-28: 11:00:00 via TOPICAL

## 2015-03-28 MED ORDER — LACTATED RINGERS IV SOLN
INTRAVENOUS | Status: DC
  Administered 2015-03-28 (×3): via INTRAVENOUS

## 2015-03-28 MED ORDER — SODIUM CHLORIDE 0.9% FLUSH: 3.0000 mL | INTRAVENOUS | Status: DC | PRN

## 2015-03-28 MED ORDER — DEXAMETHASONE SODIUM PHOSPHATE 10 MG/ML IJ SOLN
INTRAMUSCULAR | Status: DC | PRN
Start: 2015-03-28 — End: 2015-03-28
  Administered 2015-03-28: 10 mg via INTRAVENOUS

## 2015-03-28 SURGICAL SUPPLY — 68 items
BAG DECANTER FOR FLEXI CONT (MISCELLANEOUS) ×3 IMPLANT
BLADE CLIPPER SURG (BLADE) IMPLANT
BUR MATCHSTICK NEURO 3.0 LAGG (BURR) ×3 IMPLANT
CAGE COROENT MP 8X9X23M-8 SPIN (Cage) ×6 IMPLANT
CANISTER SUCT 3000ML PPV (MISCELLANEOUS) ×3 IMPLANT
CONT SPEC 4OZ CLIKSEAL STRL BL (MISCELLANEOUS) ×3 IMPLANT
COVER BACK TABLE 60X90IN (DRAPES) ×3 IMPLANT
DECANTER SPIKE VIAL GLASS SM (MISCELLANEOUS) ×3 IMPLANT
DERMABOND ADVANCED (GAUZE/BANDAGES/DRESSINGS) ×2
DERMABOND ADVANCED .7 DNX12 (GAUZE/BANDAGES/DRESSINGS) ×1 IMPLANT
DEVICE DISSECT PLASMABLAD 3.0S (MISCELLANEOUS) ×1 IMPLANT
DRAPE C-ARM 42X72 X-RAY (DRAPES) ×6 IMPLANT
DRAPE C-ARMOR (DRAPES) ×3 IMPLANT
DRAPE LAPAROTOMY 100X72X124 (DRAPES) ×3 IMPLANT
DRAPE POUCH INSTRU U-SHP 10X18 (DRAPES) ×3 IMPLANT
DRAPE PROXIMA HALF (DRAPES) ×3 IMPLANT
DRSG OPSITE POSTOP 4X6 (GAUZE/BANDAGES/DRESSINGS) ×3 IMPLANT
DURAPREP 26ML APPLICATOR (WOUND CARE) ×3 IMPLANT
ELECT REM PT RETURN 9FT ADLT (ELECTROSURGICAL) ×3
ELECTRODE REM PT RTRN 9FT ADLT (ELECTROSURGICAL) ×1 IMPLANT
GAUZE SPONGE 4X4 12PLY STRL (GAUZE/BANDAGES/DRESSINGS) ×3 IMPLANT
GAUZE SPONGE 4X4 16PLY XRAY LF (GAUZE/BANDAGES/DRESSINGS) ×3 IMPLANT
GLOVE BIO SURGEON STRL SZ7 (GLOVE) ×3 IMPLANT
GLOVE BIOGEL PI IND STRL 8.5 (GLOVE) ×3 IMPLANT
GLOVE BIOGEL PI INDICATOR 8.5 (GLOVE) ×6
GLOVE ECLIPSE 8.5 STRL (GLOVE) ×9 IMPLANT
GLOVE ECLIPSE 9.0 STRL (GLOVE) ×3 IMPLANT
GLOVE EXAM NITRILE LRG STRL (GLOVE) IMPLANT
GLOVE EXAM NITRILE MD LF STRL (GLOVE) IMPLANT
GLOVE EXAM NITRILE XL STR (GLOVE) IMPLANT
GLOVE EXAM NITRILE XS STR PU (GLOVE) IMPLANT
GLOVE INDICATOR 7.5 STRL GRN (GLOVE) ×3 IMPLANT
GOWN STRL REUS W/ TWL LRG LVL3 (GOWN DISPOSABLE) IMPLANT
GOWN STRL REUS W/ TWL XL LVL3 (GOWN DISPOSABLE) IMPLANT
GOWN STRL REUS W/TWL 2XL LVL3 (GOWN DISPOSABLE) ×6 IMPLANT
GOWN STRL REUS W/TWL LRG LVL3 (GOWN DISPOSABLE)
GOWN STRL REUS W/TWL XL LVL3 (GOWN DISPOSABLE)
HEMOSTAT POWDER KIT SURGIFOAM (HEMOSTASIS) IMPLANT
HEMOSTAT POWDER SURGIFOAM 1G (HEMOSTASIS) ×3 IMPLANT
KIT BASIN OR (CUSTOM PROCEDURE TRAY) ×3 IMPLANT
KIT INFUSE SMALL (Orthopedic Implant) ×3 IMPLANT
KIT ROOM TURNOVER OR (KITS) ×3 IMPLANT
MILL MEDIUM DISP (BLADE) ×3 IMPLANT
MODULE POWER NUVASIVE (MISCELLANEOUS) ×1 IMPLANT
NEEDLE HYPO 22GX1.5 SAFETY (NEEDLE) ×3 IMPLANT
NS IRRIG 1000ML POUR BTL (IV SOLUTION) ×3 IMPLANT
PACK LAMINECTOMY NEURO (CUSTOM PROCEDURE TRAY) ×3 IMPLANT
PAD ARMBOARD 7.5X6 YLW CONV (MISCELLANEOUS) ×9 IMPLANT
PATTIES SURGICAL .5 X1 (DISPOSABLE) IMPLANT
PLASMABLADE 3.0S (MISCELLANEOUS) ×3
POWER MODULE NUVASIVE (MISCELLANEOUS) ×3
ROD RELINE-O LORD 5.5X35MM (Rod) ×6 IMPLANT
SCREW LOCK RELINE 5.5 TULIP (Screw) ×12 IMPLANT
SCREW RELINE-O POLY 6.5X45 (Screw) ×12 IMPLANT
SPONGE LAP 4X18 X RAY DECT (DISPOSABLE) IMPLANT
SPONGE SURGIFOAM ABS GEL 100 (HEMOSTASIS) ×3 IMPLANT
SUT VIC AB 1 CT1 18XBRD ANBCTR (SUTURE) ×2 IMPLANT
SUT VIC AB 1 CT1 8-18 (SUTURE) ×4
SUT VIC AB 2-0 CP2 18 (SUTURE) ×6 IMPLANT
SUT VIC AB 3-0 SH 8-18 (SUTURE) ×3 IMPLANT
SYR 3ML LL SCALE MARK (SYRINGE) ×12 IMPLANT
TOWEL OR 17X24 6PK STRL BLUE (TOWEL DISPOSABLE) ×3 IMPLANT
TOWEL OR 17X26 10 PK STRL BLUE (TOWEL DISPOSABLE) ×3 IMPLANT
TRAP SPECIMEN MUCOUS 40CC (MISCELLANEOUS) ×3 IMPLANT
TRAY FOLEY W/METER SILVER 14FR (SET/KITS/TRAYS/PACK) ×3 IMPLANT
TUBE CONNECTING 12'X1/4 (SUCTIONS) ×1
TUBE CONNECTING 12X1/4 (SUCTIONS) ×2 IMPLANT
WATER STERILE IRR 1000ML POUR (IV SOLUTION) ×3 IMPLANT

## 2015-03-28 NOTE — Anesthesia Procedure Notes (Signed)
Procedure Name: Intubation Date/Time: 03/28/2015 9:22 AM Performed by: Neldon Newport Pre-anesthesia Checklist: Patient being monitored, Suction available, Emergency Drugs available, Patient identified and Timeout performed Patient Re-evaluated:Patient Re-evaluated prior to inductionOxygen Delivery Method: Circle system utilized Preoxygenation: Pre-oxygenation with 100% oxygen Intubation Type: IV induction Ventilation: Mask ventilation without difficulty Laryngoscope Size: Mac and 3 Grade View: Grade I Tube type: Oral Tube size: 7.0 mm Number of attempts: 1 Placement Confirmation: positive ETCO2,  ETT inserted through vocal cords under direct vision and breath sounds checked- equal and bilateral Secured at: 21 cm Tube secured with: Tape Dental Injury: Teeth and Oropharynx as per pre-operative assessment

## 2015-03-28 NOTE — Anesthesia Preprocedure Evaluation (Signed)
Anesthesia Evaluation  Patient identified by MRN, date of birth, ID band Patient awake    Reviewed: Allergy & Precautions, NPO status , Patient's Chart, lab work & pertinent test results  History of Anesthesia Complications (+) PONV and history of anesthetic complications  Airway Mallampati: I  TM Distance: >3 FB Neck ROM: Full    Dental  (+) Teeth Intact   Pulmonary neg pulmonary ROS,    breath sounds clear to auscultation       Cardiovascular hypertension,  Rhythm:Regular Rate:Normal     Neuro/Psych PSYCHIATRIC DISORDERS Anxiety negative neurological ROS     GI/Hepatic negative GI ROS, Neg liver ROS,   Endo/Other  negative endocrine ROS  Renal/GU negative Renal ROS  negative genitourinary   Musculoskeletal  (+) Arthritis ,   Abdominal   Peds negative pediatric ROS (+)  Hematology   Anesthesia Other Findings   Reproductive/Obstetrics negative OB ROS                             Anesthesia Physical Anesthesia Plan  ASA: II  Anesthesia Plan: General   Post-op Pain Management:    Induction: Intravenous  Airway Management Planned: Oral ETT  Additional Equipment:   Intra-op Plan:   Post-operative Plan: Extubation in OR  Informed Consent: I have reviewed the patients History and Physical, chart, labs and discussed the procedure including the risks, benefits and alternatives for the proposed anesthesia with the patient or authorized representative who has indicated his/her understanding and acceptance.   Dental advisory given  Plan Discussed with: CRNA  Anesthesia Plan Comments:         Anesthesia Quick Evaluation

## 2015-03-28 NOTE — Op Note (Signed)
Date of surgery: 03/28/2015 Preoperative diagnosis: Spondylolisthesis L4-L5 with lumbar radiculopathy, degenerative scoliosis, Postoperative diagnosis: Spondylolisthesis L4-L5 with lumbar radiculopathy, degenerative scoliosis Procedure: L4-L5 decompressive laminectomy decompression of L4 and L5 nerve roots, posterior lumbar interbody arthrodesis with peek spacers local autograft and allograft, pedicle screw fixation L4-L5, posterior lateral arthrodesis L4-L5  Surgeon: [name] M.D.  Asst.: Earnie Larsson M.D.  Indications: Patient is Regina Hall is a 81 y.o. female who who's had significant back pain and lumbar radiculopathy for over a years period time. A lumbar myelogram demonstrates advanced spondylolisthesis with high-grade canal stenosis. she was advised regarding surgical intervention.  Procedure: The patient was brought to the operating room supine on a stretcher. After the smooth induction of general endotracheal anesthesia she was turned prone and the back was prepped with alcohol and DuraPrep. The back was then draped sterilely. A midline incision was created and carried down to the lumbar dorsal fascia. A localizing radiograph identified the L4 and L5 spinous processes. A subligamentous dissection was created at L4 and L5 to expose the interlaminar space at L4 and L5 and the facet joints over the L4-L5 interspace. Laminotomies were were then created removing the entire inferior margin of the lamina of L4 including the inferior facet at the L4-L5 joint. The yellow ligament was taken up and the common dural tube was exposed along with the L4 nerve root superiorly, and the L5 nerve root inferiorly, the disc space was exposed and epidural veins in this region were cauterized and divided. The L4 nerve roots and the L5 nerve root were dissected with care taken to protect them. The disc space was opened and a combination of curettes and rongeurs was used to evacuate the disc space fully. The  endplates were removed using sharp curettes. An interbody spacer was placed to distract the disc space while the contralateral discectomy was performed. When the entirety of the disc was removed and the endplates were prepared final sizing of the disc space was obtained and 24mm tall 8 lordotic 23 mm long peek spacers were chosen and packed with autograft and allograft and placed into the interspace. Infuse was also used. The remainder of the interspace was packed with autograft and allograft. Pedicle entry sites were then chosen using fluoroscopic guidance and 6.5 x 45 mm screws were placed in L4 and 6.5 x 45 mm screws were placed in L5. The lateral gutters were decorticated and graft was packed in the posterolateral gutters between L4 and L5 in addition to strips of infuse. Final radiographs were obtained after placing appropriately sized rods between the pedicle screws at L4-L5 and torquing these to the appropriate tension. The surgical site was inspected carefully to assure the L4 and L5 nerve roots were well decompressed, hemostasis was obtained, and the graft was well packed. Then the retractors were removed and the wound was closed with #1 Vicryl in the lumbar dorsal fascia 2-0 Vicryl in the subcutaneous tissue and 3-0 Vicryl subcuticularly. When he cc of half percent Marcaine was injected into the paraspinous musculature at the time of closure. Blood loss was estimated at 200 cc. The patient tolerated procedure well and was returned to the recovery room in stable condition.

## 2015-03-28 NOTE — Progress Notes (Signed)
Utilization review completed.  

## 2015-03-28 NOTE — Progress Notes (Signed)
Pharmacy Communication Note:  Pharmacy to dose 1 dose of Vancomycin 1 gm IV 12 hours post op per protocol since RN verified that patient does not have drain in place.   Albertina Parr, PharmD., BCPS Clinical Pharmacist Pager [phone]

## 2015-03-28 NOTE — Transfer of Care (Signed)
Immediate Anesthesia Transfer of Care Note  Patient: Regina Hall  Procedure(s) Performed: Procedure(s) with comments: Lumbar four- five Posterior lumbar interbody fusion (N/A) - L4-5 Posterior lumbar interbody fusion  Patient Location: PACU  Anesthesia Type:General  Level of Consciousness: awake, alert  and oriented  Airway & Oxygen Therapy: Patient Spontanous Breathing and Patient connected to nasal cannula oxygen  Post-op Assessment: Report given to RN, Post -op Vital signs reviewed and stable and Patient moving all extremities X 4  Post vital signs: Reviewed and stable  Last Vitals:  Filed Vitals:   03/28/15 0627  BP: 131/61  Pulse: 75  Temp: 36.8 C  Resp: 20    Complications: No apparent anesthesia complications

## 2015-03-28 NOTE — H&P (Signed)
Regina Hall is an 81 y.o. female.   Chief Complaint: Back and bilateral lower extremity pain HPI: Regina Hall a 81 year old individual who's had some back surgery a number of years ago by Dr. Oneida Arenas she's had back pain and bilateral lower extremity pain occasionally worse on the left than on the right. She underwent surgical decompression by Dr. Luiz Ochoa who initially had done a simple decompression but she subsequently had persistent and worsening pain she [facility] and she notes that they did not provide any significant help she subsequent he saw Dr. Rufina Falco who did a myelogram and post myelogram CAT scan suggested some surgery denied subsequently seen her noted that she had a rotatory subluxation at L4-L5 with lateral recess stenosis worse on the left than on the right at that level she also has a degenerative scoliosis of approximately 25. I advise a single level decompression and arthrodesis. She is now being admitted to undergo that surgery.  Past Medical History  Diagnosis Date  . Hypertension   . Arthritis     KNEES AND BACK  . Anemia   . Bladder incontinence   . PONV (postoperative nausea and vomiting)     PONV X 1 EPISODE, COMES OUT OF ANESTHESIA FAST, SOME AWARENESS DURING END OF COLONSCOPY  . Scoliosis   . Anxiety     Past Surgical History  Procedure Laterality Date  . Joint replacement      2011 LT HIP  . Tubal ligation      1974  . Abdominal hysterectomy      2006 VAG HYST  . Back surgery      LOWER, X2  . Total knee arthroplasty Right 04/27/2014    Procedure: RIGH TOTAL KNEE ARTHROPLASTY;  Surgeon: Rod Can, MD;  Location: [facility];  Service: Orthopedics;  Laterality: Right;  . Tonsillectomy      History reviewed. No pertinent family history. Social History:  reports that she has never smoked. She has never used smokeless tobacco. She reports that she drinks alcohol. She reports that she does not use illicit  drugs.  Allergies:  Allergies  Allergen Reactions  . Penicillins Rash    Medications Prior to Admission  Medication Sig Dispense Refill  . Biotin 10 MG TABS Take 10 mg by mouth every morning.     . Carboxymethylcellulose Sodium (THERATEARS OP) Place 2 drops into both eyes as needed (for dry eyes).    Marland Kitchen CHIA OIL PO Take 15 mLs by mouth at bedtime.     . Cholecalciferol (VITAMIN D3) 5000 units TABS Take 5,000 Units by mouth daily.    . COLLAGEN PO Take 1 capsule by mouth daily.    . Glucosamine Sulfate 1000 MG CAPS Take 2,000 mg by mouth daily.    Marland Kitchen HYDROcodone-acetaminophen (NORCO) 10-325 MG tablet Take 0.5-1 tablets by mouth daily as needed for moderate pain.    Marland Kitchen losartan (COZAAR) 25 MG tablet Take 25 mg by mouth every morning.    . Omega-3 Fatty Acids (FISH OIL) 1000 MG CAPS Take 1,000 mg by mouth daily.    Marland Kitchen OVER THE COUNTER MEDICATION Take 1 tablet by mouth daily as needed (when around people that are sick/ cold weather.). congutlex    . OVER THE COUNTER MEDICATION Take 1 capsule by mouth daily as needed (for cold). Blue [employer] Liver Oil    . Oxymetazoline HCl (SINUS RELIEF NA) Place 1-2 sprays into both nostrils as needed (for  congestion).    . triamterene-hydrochlorothiazide (DYAZIDE) 37.5-25 MG per capsule Take 1 capsule by mouth every morning.     Marland Kitchen HYDROcodone-acetaminophen (NORCO/VICODIN) 5-325 MG per tablet Take 1-2 tablets by mouth every 4 (four) hours as needed (breakthrough pain). (Patient not taking: Reported on 03/17/2015) 90 tablet 0  . ondansetron (ZOFRAN) 4 MG tablet Take 1 tablet (4 mg total) by mouth every 6 (six) hours as needed for nausea. (Patient not taking: Reported on 03/17/2015) 20 tablet 0  . rivaroxaban (XARELTO) 10 MG TABS tablet Take 1 tablet (10 mg total) by mouth daily with breakfast. (Patient not taking: Reported on 03/17/2015) 30 tablet 0    No results found for this or any previous visit (from the past 48 hour(s)). No results found.  Review of Systems   HENT: Negative.   Eyes: Negative.   Respiratory: Negative.   Cardiovascular: Negative.   Gastrointestinal: Negative.   Musculoskeletal: Positive for back pain.       History of knee replacements  Skin: Negative.   Neurological: Positive for weakness.  Psychiatric/Behavioral: Negative.     Blood pressure 131/61, pulse 75, temperature 98.2 F (36.8 C), temperature source Oral, resp. rate 20, weight 94.802 kg (209 lb), SpO2 98 %. Physical Exam  Constitutional: She is oriented to person, place, and time. She appears well-developed and well-nourished.  HENT:  Head: Normocephalic and atraumatic.  Eyes: Conjunctivae and EOM are normal. Pupils are equal, round, and reactive to light.  Neck: Normal range of motion. Neck supple.  Cardiovascular: Normal rate and regular rhythm.   Respiratory: Effort normal and breath sounds normal.  GI: Soft. Bowel sounds are normal.  Musculoskeletal:  Centralized low back pain positive straight leg raising at 30 in either lower extremity Patrick's maneuver is negative  Neurological: She is alert and oriented to person, place, and time.  Weakness of the tibialis anterior on the left dorsi and plantar flexor weakness bilaterally  Skin: Skin is warm and dry.  Psychiatric: She has a normal mood and affect. Her behavior is normal. Judgment and thought content normal.     Assessment/Plan Degenerative spondylolisthesis, degenerative scoliosis of the lumbar spine, lumbar radiculopathy.  Surgical decompression and stabilization L4-L5  Earleen Newport, MD 03/28/2015, 9:12 AM

## 2015-03-29 MED FILL — Heparin Sodium (Porcine) Inj 1000 Unit/ML: INTRAMUSCULAR | Qty: 30 | Status: AC

## 2015-03-29 MED FILL — Sodium Chloride IV Soln 0.9%: INTRAVENOUS | Qty: 1000 | Status: AC

## 2015-03-29 NOTE — Evaluation (Signed)
Physical Therapy Evaluation Patient Details Name: Regina Hall MRN: [id number] DOB: [birth year] Today's Date: 03/29/2015   History of Present Illness  81 y.o. s/p L4-L5 decompressive laminectomy decompression of L4 and L5 nerve roots, posterior lumbar interbody arthrodesis with peek spacers local autograft and allograft, pedicle screw fixation L4-L5, posterior lateral arthrodesis L4-L5. PMH includes back surgery x2, HTN, arthritis, anemia, bladder incontinence, PONV, scoliosis, anxiety, left hip surgery, and Rt TKA.   Clinical Impression  Pt was able to ambulate into the hallway with RW.  Reinforced back education.  Pt is very experienced in back surgery and once acute education completed, I do not recommend f/u at home.  Husband will be around for support if needed.   PT to follow acutely for deficits listed below.       Follow Up Recommendations No PT follow up    Equipment Recommendations  None recommended by PT    Recommendations for Other Services   NA    Precautions / Restrictions Precautions Precautions: Back;Fall Precaution Comments: reinforced back precautions, lifting restrictions Required Braces or Orthoses: Spinal Brace Spinal Brace: Lumbar corset (pt prefers to donn in standing.)      Mobility                 Transfers Overall transfer level: Needs assistance Equipment used: Rolling walker (2 wheeled) Transfers: Sit to/from Stand Sit to Stand: Min guard         General transfer comment: Min guard assist for safety  Ambulation/Gait Ambulation/Gait assistance: Min guard Ambulation Distance (Feet): 80 Feet Assistive device: Rolling walker (2 wheeled) Gait Pattern/deviations: Step-through pattern;Shuffle;Trunk flexed Gait velocity: decreased Gait velocity interpretation: Below normal speed for age/gender General Gait Details: Verbal cues for safe RW proximity and upright posture during gait.         Balance Overall balance assessment: Needs  assistance Sitting-balance support: Bilateral upper extremity supported Sitting balance-Leahy Scale: Good     Standing balance support: Bilateral upper extremity supported Standing balance-Leahy Scale: Poor                               Pertinent Vitals/Pain Pain Assessment: Faces Faces Pain Scale: Hurts little more Pain Location: low back Pain Descriptors / Indicators: Aching;Burning Pain Intervention(s): Limited activity within patient's tolerance;Monitored during session;Repositioned;RN gave pain meds during session    Home Living Family/patient expects to be discharged to:: Private residence Living Arrangements: Spouse/significant other Available Help at Discharge: Family;Available PRN/intermittently Type of Home: House Home Access: Ramped entrance     Home Layout: One level Home Equipment: Cane - single point;Walker - 2 wheels;Bedside commode;Walker - 4 wheels;Adaptive equipment;Shower seat - built in      Prior Function Level of Independence: Needs assistance   Gait / Transfers Assistance Needed: using canes and walker for ambulation  ADL's / Homemaking Assistance Needed: assist with cleaning           Extremity/Trunk Assessment   Upper Extremity Assessment: Defer to OT evaluation           Lower Extremity Assessment: Generalized weakness      Cervical / Trunk Assessment: Other exceptions  Communication   Communication: No difficulties  Cognition Arousal/Alertness: Awake/alert Behavior During Therapy: WFL for tasks assessed/performed Overall Cognitive Status: Within Functional Limits for tasks assessed  Assessment/Plan    PT Assessment Patient needs continued PT services  PT Diagnosis Difficulty walking;Abnormality of gait;Generalized weakness;Acute pain   PT Problem List Decreased strength;Decreased activity tolerance;Decreased balance;Decreased mobility;Decreased knowledge of use of  DME;Decreased knowledge of precautions;Pain  PT Treatment Interventions DME instruction;Gait training;Functional mobility training;Therapeutic activities;Therapeutic exercise;Balance training;Neuromuscular re-education;Patient/family education;Modalities   PT Goals (Current goals can be found in the Care Plan section) Acute Rehab PT Goals Patient Stated Goal: to go home, get stronger in her legs PT Goal Formulation: With patient Time For Goal Achievement: 04/05/15 Potential to Achieve Goals: Good    Frequency Min 5X/week           End of Session Equipment Utilized During Treatment: Back brace Activity Tolerance: Patient limited by pain Patient left: in chair;with call bell/phone within reach           Time: [id number] PT Time Calculation (min) (ACUTE ONLY): 12 min   Charges:   PT Evaluation $PT Eval Moderate Complexity: 1 Procedure          Aldrick Derrig B. Afifa Truax, PT, DPT [phone]   03/29/2015, 4:36 PM

## 2015-03-29 NOTE — Progress Notes (Signed)
Orthopedic Tech Progress Note Patient Details:  Regina Hall 02-21-1934 [id number] Patient already has brace. Patient ID: Regina Hall, female   DOB: 08/22/1934, 81 y.o.   MRN: [id number]   Braulio Bosch 03/29/2015, 3:37 PM

## 2015-03-29 NOTE — Anesthesia Postprocedure Evaluation (Signed)
Anesthesia Post Note  Patient: Regina Hall  Procedure(s) Performed: Procedure(s) (LRB): Lumbar four- five Posterior lumbar interbody fusion (N/A)  Patient location during evaluation: PACU Anesthesia Type: General Level of consciousness: awake and alert Pain management: pain level controlled Vital Signs Assessment: post-procedure vital signs reviewed and stable Respiratory status: spontaneous breathing, nonlabored ventilation, respiratory function stable and patient connected to nasal cannula oxygen Cardiovascular status: blood pressure returned to baseline and stable Postop Assessment: no signs of nausea or vomiting Anesthetic complications: no    Last Vitals:  Filed Vitals:   03/29/15 0135 03/29/15 0500  BP: 114/48 125/61  Pulse: 83 74  Temp: 37.4 C 37.1 C  Resp: 18 16    Last Pain:  Filed Vitals:   03/29/15 0614  PainSc: [name]

## 2015-03-29 NOTE — Evaluation (Signed)
Occupational Therapy Evaluation Patient Details Name: Regina Hall MRN: [id number] DOB: 10-20-1934 Today's Date: 03/29/2015    History of Present Illness 81 y.o. s/p L4-L5 decompressive laminectomy decompression of L4 and L5 nerve roots, posterior lumbar interbody arthrodesis with peek spacers local autograft and allograft, pedicle screw fixation L4-L5, posterior lateral arthrodesis L4-L5. PMH includes back surgery x2, HTN, arthritis, anemia, bladder incontinence, PONV, scoliosis, anxiety, left hip surgery, and Rt TKA.    Clinical Impression   Pt s/p above. Pt independent with ADLs, PTA. Feel pt will benefit from acute OT to increase independence prior to d/c. Spouse available to assist upon d/c.    Follow Up Recommendations  No OT follow up;Supervision - Intermittent    Equipment Recommendations  Other (comment) (AE)    Recommendations for Other Services       Precautions / Restrictions Precautions Precautions: Back;Fall Precaution Booklet Issued: No Precaution Comments: educated on back precautions Required Braces or Orthoses: Spinal Brace Spinal Brace: Lumbar corset (already on in session; adjusted in standing) Restrictions Weight Bearing Restrictions: No      Mobility Bed Mobility Overal bed mobility: Needs Assistance Bed Mobility: Rolling;Sit to Sidelying Rolling: Min assist       Sit to sidelying: Mod assist General bed mobility comments: cues for technique.  Transfers Overall transfer level: Needs assistance   Transfers: Sit to/from Stand Sit to Stand: Min guard              Balance    Pt pushed IV pole for ambulation. Able to perform functional task while standing without LOB.                                        ADL Overall ADL's : Needs assistance/impaired                 Upper Body Dressing : Min guard;Sitting;Standing   Lower Body Dressing: Sit to/from stand;Moderate assistance   Toilet Transfer: Min  guard;Ambulation (sit to stand from chair)           Functional mobility during ADLs: Min guard (pushed IV pole) General ADL Comments: Discussed incorporating precautions into functional activities. Educated on AE.      Vision     Perception     Praxis      Pertinent Vitals/Pain Pain Assessment: 0-10 Pain Score:  (5-6) Pain Location: back Pain Intervention(s): Monitored during session;Repositioned     Hand Dominance     Extremity/Trunk Assessment Upper Extremity Assessment Upper Extremity Assessment: LUE deficits/detail LUE Deficits / Details: limited ROM in left shoulder-baseline   Lower Extremity Assessment Lower Extremity Assessment: Defer to PT evaluation       Communication Communication Communication: No difficulties   Cognition Arousal/Alertness: Awake/alert Behavior During Therapy: WFL for tasks assessed/performed Overall Cognitive Status: Within Functional Limits for tasks assessed                     General Comments       Exercises       Shoulder Instructions      Home Living Family/patient expects to be discharged to:: Private residence Living Arrangements: Spouse/significant other Available Help at Discharge: Family (spouse there most of the time) Type of Home: House Home Access: Ramped entrance     Home Layout: One level     Bathroom Shower/Tub: Occupational psychologist: Standard  Home Equipment: Kasandra Knudsen - single point;Walker - 2 wheels;Bedside commode;Walker - 4 wheels;Adaptive equipment;Shower seat - built in (pt reports that she ordered a toilet riser) Adaptive Equipment: Reacher        Prior Functioning/Environment Level of Independence: Needs assistance  Gait / Transfers Assistance Needed: using canes and walker for ambulation ADL's / Homemaking Assistance Needed: assist with cleaning        OT Diagnosis: Acute pain   OT Problem List: Pain;Decreased knowledge of precautions;Decreased  strength;Decreased range of motion;Impaired balance (sitting and/or standing);Decreased knowledge of use of DME or AE   OT Treatment/Interventions: Self-care/ADL training;DME and/or AE instruction;Therapeutic activities;Patient/family education;Balance training    OT Goals(Current goals can be found in the care plan section) Acute Rehab OT Goals Patient Stated Goal: not stated OT Goal Formulation: With patient Time For Goal Achievement: 04/05/15 Potential to Achieve Goals: Good ADL Goals Pt Will Perform Grooming: with set-up;standing Pt Will Perform Lower Body Dressing: with set-up;with supervision;sit to/from stand;with adaptive equipment Pt Will Transfer to Toilet: with supervision;ambulating;with set-up (3 in 1 over commode or riser on toilet) Pt Will Perform Toileting - Clothing Manipulation and hygiene: sit to/from stand;with modified independence Pt Will Perform Tub/Shower Transfer: Shower transfer;shower seat;rolling walker;with set-up;with supervision Additional ADL Goal #1: pt will independently verbalize 3/3 back precautions and maintain in session. Additional ADL Goal #2: pt will don/doff back brace with set up assistance.  OT Frequency: Min 2X/week   Barriers to D/C:            Co-evaluation              End of Session Equipment Utilized During Treatment: Gait belt;Back brace;Other (comment) (AE) Nurse Communication: Other (comment) (pt not to sit longer than 45 min-1 hour; IV leaking)  Activity Tolerance: Patient tolerated treatment well Patient left: with call bell/phone within reach;in bed;with bed alarm set   Time: [id number] OT Time Calculation (min): 21 min Charges:  OT General Charges $OT Visit: 1 Procedure OT Evaluation $OT Eval Moderate Complexity: 1 Procedure G-CodesBenito Mccreedy OTR/L [id number] 03/29/2015, 10:26 AM

## 2015-03-29 NOTE — Care Management Note (Signed)
Case Management Note  Patient Details  Name: Regina Hall MRN: [id number] Date of Birth: 1934/07/22  Subjective/Objective:                    Action/Plan: Patient was admitted for a Lumbar four- five Posterior lumbar interbody fusion.  Lives at home with spouse. Will follow for discharge needs pending PT/OT evals and physician orders.   Expected Discharge Date:                  Expected Discharge Plan:     In-House Referral:     Discharge planning Services     Post Acute Care Choice:    Choice offered to:     DME Arranged:    DME Agency:     HH Arranged:    HH Agency:     Status of Service:  In process, will continue to follow  Medicare Important Message Given:    Date Medicare IM Given:    Medicare IM give by:    Date Additional Medicare IM Given:    Additional Medicare Important Message give by:     If discussed at [place] of Stay Meetings, dates discussed:    Additional Comments:  Rolm Baptise, RN 03/29/2015, 11:33 AM [phone]

## 2015-03-29 NOTE — Progress Notes (Signed)
Patient ID: Regina Hall, female   DOB: Mar 20, 1934, 81 y.o.   MRN: [id number] Vital signs are stable Motor function appears good in lower extremities Dressing is clean and dry Patient has been ambulating Voiding without Foley catheter Observed today and possible discharge tomorrow if doing well overnight.

## 2015-03-30 MED ORDER — DOCUSATE SODIUM 100 MG PO CAPS
100.0000 mg | ORAL_CAPSULE | Freq: Two times a day (BID) | ORAL | Status: DC
  Administered 2015-03-31: 100 mg via ORAL
  Filled 2015-03-30 (×2): qty 1

## 2015-03-30 MED ORDER — POLYETHYLENE GLYCOL 3350 17 G PO PACK
17.0000 g | PACK | Freq: Every day | ORAL | Status: DC
  Administered 2015-03-30 – 2015-03-31 (×2): 17 g via ORAL
  Filled 2015-03-30 (×2): qty 1

## 2015-03-30 MED ORDER — SENNA 8.6 MG PO TABS
1.0000 | ORAL_TABLET | Freq: Two times a day (BID) | ORAL | Status: DC
  Administered 2015-03-30 – 2015-03-31 (×2): 8.6 mg via ORAL
  Filled 2015-03-30 (×2): qty 1

## 2015-03-30 MED ORDER — SENNA 8.6 MG PO TABS
1.0000 | ORAL_TABLET | Freq: Every day | ORAL | Status: DC
  Administered 2015-03-30: 8.6 mg via ORAL
  Filled 2015-03-30: qty 1

## 2015-03-30 NOTE — Progress Notes (Addendum)
Occupational Therapy Treatment Patient Details Name: Regina Hall MRN: [id number] DOB: Aug 05, 1934 Today's Date: 03/30/2015    History of present illness 81 y.o. s/p L4-L5 decompressive laminectomy decompression of L4 and L5 nerve roots, posterior lumbar interbody arthrodesis with peek spacers local autograft and allograft, pedicle screw fixation L4-L5, posterior lateral arthrodesis L4-L5. PMH includes back surgery x2, HTN, arthritis, anemia, bladder incontinence, PONV, scoliosis, anxiety, left hip surgery, and Rt TKA.    OT comments  Pt progressing. Education provided in session. Will continue to follow acutely.  Follow Up Recommendations  No OT follow up;Supervision - Intermittent    Equipment Recommendations  Other (comment) (AE)    Recommendations for Other Services      Precautions / Restrictions Precautions Precautions: Back;Fall Precaution Booklet Issued: No Precaution Comments: pt able to state 3/3 back precautions Required Braces or Orthoses: Spinal Brace Spinal Brace: Lumbar corset;Applied in sitting position (adjusted in standing) Restrictions Weight Bearing Restrictions: No       Mobility Bed Mobility Overal bed mobility: Needs Assistance Bed Mobility: Rolling;Sidelying to Sit Rolling: Min assist (reached for therapist) Sidelying to sit: Min guard        General bed mobility comments: cues for technique  Transfers Overall transfer level: Needs assistance   Transfers: Sit to/from Stand Sit to Stand: Supervision (and set up for RW)         General transfer comment: RW in front    Balance    No LOB with ambulation with RW.                               ADL Overall ADL's : Needs assistance/impaired Eating/Feeding: Bed level  Eating/Feeding Details (indicate cue type and reason):  cues for no twisting when managing items on her tray; independent eating her food             Upper Body Dressing : Minimal  assistance;Sitting;Standing Upper Body Dressing Details (indicate cue type and reason): back brace Lower Body Dressing: Set up;Supervision/safety;Sitting/lateral leans;With adaptive equipment (donning/doffing sock)   Toilet Transfer: Set up;Supervision/safety;Ambulation;RW (set up for RW; sit to stand from bed)           Functional mobility during ADLs: Rolling walker;Set up;Supervision/safety (set up for RW) General ADL Comments: Discussed incorporating back precautions into functional activities. Pt practiced with reacher and sock aid. Discussed back brace wear. Educated on safety such as use of bag on walker, rugs/items on floor, recommended spouse be with her for shower transfer and someone with her when she is bathing.  Educated on what pt could use for toilet aid if needed. Discussed shower transfer safety.      Vision                     Perception     Praxis      Cognition  Awake/Alert Behavior During Therapy: WFL for tasks assessed/performed Overall Cognitive Status: Within Functional Limits for tasks assessed                       Extremity/Trunk Assessment               Exercises     Shoulder Instructions       General Comments      Pertinent Vitals/ Pain       Pain Assessment: 0-10 Pain Score:  (4-5) Pain Location: back Pain Intervention(s): Monitored during session  Home  Living                                          Prior Functioning/Environment              Frequency Min 2X/week     Progress Toward Goals  OT Goals(current goals can now be found in the care plan section)  Progress towards OT goals: Progressing toward goals  Acute Rehab OT Goals Patient Stated Goal: not stated OT Goal Formulation: With patient Time For Goal Achievement: 04/05/15 Potential to Achieve Goals: Good ADL Goals Pt Will Perform Grooming: with set-up;standing Pt Will Perform Lower Body Dressing: with set-up;with  supervision;sit to/from stand;with adaptive equipment Pt Will Transfer to Toilet: with supervision;ambulating;with set-up (3 in 1 over commode or riser on toilet)-completed/met Pt Will Perform Toileting - Clothing Manipulation and hygiene: sit to/from stand;with modified independence Pt Will Perform Tub/Shower Transfer: Shower transfer;shower seat;rolling walker;with set-up;with supervision Additional ADL Goal #1: pt will independently verbalize 3/3 back precautions and maintain in session. Additional ADL Goal #2: pt will don/doff back brace with set up assistance.  Plan Discharge plan remains appropriate    Co-evaluation                 End of Session Equipment Utilized During Treatment: Gait belt;Rolling walker;Other (comment);Back brace (AE)   Activity Tolerance Patient tolerated treatment well   Patient Left with call bell/phone within reach;in chair   Nurse Communication          Time: 0917-0933 OT Time Calculation (min): 16 min  Charges: OT General Charges $OT Visit: 1 Procedure OT Treatments $Self Care/Home Management : 8-22 mins  ,  L OTR/L [phone] 03/30/2015, 10:02 AM    

## 2015-03-30 NOTE — Clinical Social Work Note (Signed)
CSW received referral for SNF.  PT is not recommending any follow up, plan is to discharge home.  CSW to sign off please re-consult if social work needs arise.  Jones Broom. [place], MSW, [place]

## 2015-03-30 NOTE — Progress Notes (Signed)
Physical Therapy Treatment Patient Details Name: Regina Hall MRN: [id number] DOB: 1934-10-23 Today's Date: 03/30/2015    History of Present Illness 81 y.o. s/p L4-L5 decompressive laminectomy decompression of L4 and L5 nerve roots, posterior lumbar interbody arthrodesis with peek spacers local autograft and allograft, pedicle screw fixation L4-L5, posterior lateral arthrodesis L4-L5. PMH includes back surgery x2, HTN, arthritis, anemia, bladder incontinence, PONV, scoliosis, anxiety, left hip surgery, and Rt TKA.     PT Comments    Good progress towards functional goals. Ambulating up to 100 feet today with mild use of RW for support. Seems deconditioned, reports being quite immobile at home prior to surgery but feels she has improved significantly following procedure. Will continue to follow and progress until d/c.  Follow Up Recommendations  No PT follow up     Equipment Recommendations  None recommended by PT    Recommendations for Other Services       Precautions / Restrictions Precautions Precautions: Back;Fall Precaution Comments: recalls 3/3 precautions Required Braces or Orthoses: Spinal Brace Spinal Brace: Lumbar corset Restrictions Weight Bearing Restrictions: No    Mobility  Bed Mobility Overal bed mobility: Needs Assistance Bed Mobility: Rolling;Sidelying to Sit;Sit to Sidelying Rolling: Min guard Sidelying to sit: Min guard     Sit to sidelying: Min assist General bed mobility comments: Min guard for log roll to Lt side, using rail, and cues for back precautions to rise to EOB. VC for safety. Min assist for RLE support back into bed.  Transfers Overall transfer level: Needs assistance Equipment used: Rolling walker (2 wheeled) Transfers: Sit to/from Stand Sit to Stand: Min guard         General transfer comment: Min guard for safety. Slow to rise. Performed from lowest bed setting. VC for hand placement.  Ambulation/Gait Ambulation/Gait  assistance: Min guard Ambulation Distance (Feet): 100 Feet Assistive device: Rolling walker (2 wheeled) Gait Pattern/deviations: Step-through pattern;Decreased stride length;Trunk flexed Gait velocity: decreased Gait velocity interpretation: Below normal speed for age/gender General Gait Details: VC for upright posture. Decrised stride length. Cues for hand placement on RW. No buckling. Moderately dyspneic SpO2 94% on room air, improves with sitting. Good foot clearance.   Stairs            Wheelchair Mobility    Modified Rankin (Stroke Patients Only)       Balance                                    Cognition Arousal/Alertness: Awake/alert Behavior During Therapy: WFL for tasks assessed/performed Overall Cognitive Status: Within Functional Limits for tasks assessed                      Exercises General Exercises - Lower Extremity Ankle Circles/Pumps: AROM;Both;15 reps;Supine Long Arc Quad: Strengthening;Both;10 reps;Seated    General Comments        Pertinent Vitals/Pain Pain Assessment: 0-10 Pain Score:  (No value given) Pain Location: back Pain Descriptors / Indicators: Aching Pain Intervention(s): Monitored during session;Repositioned    Home Living                      Prior Function            PT Goals (current goals can now be found in the care plan section) Acute Rehab PT Goals Patient Stated Goal: to go home, get stronger in her legs PT Goal Formulation:  With patient Time For Goal Achievement: 04/05/15 Potential to Achieve Goals: Good Progress towards PT goals: Progressing toward goals    Frequency  Min 5X/week    PT Plan Current plan remains appropriate    Co-evaluation             End of Session Equipment Utilized During Treatment: Back brace Activity Tolerance: Patient tolerated treatment well Patient left: with call bell/phone within reach;in bed;with bed alarm set     Time: 1205-1222 PT  Time Calculation (min) (ACUTE ONLY): 17 min  Charges:  $Gait Training: 8-22 mins                    G Codes:      Ellouise Newer 03-31-2015, 2:13 PM Camille [place], [place]

## 2015-03-31 MED ORDER — DIAZEPAM 5 MG PO TABS: 5.0000 mg | ORAL_TABLET | Freq: Four times a day (QID) | ORAL | Status: DC | PRN

## 2015-03-31 MED ORDER — OXYCODONE-ACETAMINOPHEN 5-325 MG PO TABS: 1.0000 | ORAL_TABLET | ORAL | Status: DC | PRN

## 2015-03-31 NOTE — Care Management Note (Signed)
Case Management Note  Patient Details  Name: Regina Hall MRN: [id number] Date of Birth: [birth year]  Subjective/Objective:                    Action/Plan: Patient discharging home with self care. No further needs per CM.   Expected Discharge Date:                  Expected Discharge Plan:  Home/Self Care  In-House Referral:     Discharge planning Services  CM Consult  Post Acute Care Choice:    Choice offered to:     DME Arranged:    DME Agency:     HH Arranged:    [place] Agency:     Status of Service:  Completed, signed off  Medicare Important Message Given:    Date Medicare IM Given:    Medicare IM give by:    Date Additional Medicare IM Given:    Additional Medicare Important Message give by:     If discussed at [place] of Stay Meetings, dates discussed:    Additional Comments:  Pollie Friar, RN 03/31/2015, 9:13 AM

## 2015-03-31 NOTE — Care Management Important Message (Signed)
Important Message  Patient Details  Name: Regina Hall MRN: [id number] Date of Birth: 1934-06-08   Medicare Important Message Given:  Yes    Jalonda [place] P Kaidin Boehle 03/31/2015, 3:20 PM

## 2015-03-31 NOTE — Progress Notes (Signed)
Physical Therapy Treatment Patient Details Name: JAIYA MOORADIAN MRN: [id number] DOB: May 28, 1934 Today's Date: 03/31/2015    History of Present Illness 81 y.o. s/p L4-L5 decompressive laminectomy , posterior lumbar interbody arthrodesis with peek spacers local autograft and allograft. PMH includes back surgery x2, HTN, arthritis, anemia, bladder incontinence, PONV, scoliosis, anxiety, left hip surgery, and Rt TKA.     PT Comments    Pt with good sequence with transfers and gait but requires cues to maintain precautions at all times. Pt with left hip pain limiting gait. Able to state all precautions and educated for all mobility with use of handout today. Will follow acutely but pt has met mobility goals at a level safe for D/C.   Follow Up Recommendations  No PT follow up     Equipment Recommendations  None recommended by PT    Recommendations for Other Services       Precautions / Restrictions Precautions Precautions: Back;Fall Precaution Comments: recalls 3/3 precautions Required Braces or Orthoses: Spinal Brace Spinal Brace: Lumbar corset;Applied in sitting position Restrictions Weight Bearing Restrictions: No    Mobility  Bed Mobility Overal bed mobility: Needs Assistance Bed Mobility: Sidelying to Sit;Sit to Sidelying;Rolling Rolling: Supervision       Sit to sidelying: Supervision General bed mobility comments: cues for precautions particularly to not twist  Transfers Overall transfer level: Needs assistance   Transfers: Sit to/from Stand Sit to Stand: Supervision         General transfer comment: cues for hand placement and to not pull on walker  Ambulation/Gait Ambulation/Gait assistance: Supervision Ambulation Distance (Feet): 190 Feet Assistive device: Rolling walker (2 wheeled) Gait Pattern/deviations: Step-through pattern;Decreased stride length   Gait velocity interpretation: Below normal speed for age/gender General Gait Details: cues for  posture and position in RW   Stairs            Wheelchair Mobility    Modified Rankin (Stroke Patients Only)       Balance     Sitting balance-Leahy Scale: Good                              Cognition Arousal/Alertness: Awake/alert Behavior During Therapy: WFL for tasks assessed/performed Overall Cognitive Status: Within Functional Limits for tasks assessed                      Exercises      General Comments        Pertinent Vitals/Pain Pain Score: 3  Pain Location: back Pain Descriptors / Indicators: Aching Pain Intervention(s): Limited activity within patient's tolerance;Repositioned    Home Living                      Prior Function            PT Goals (current goals can now be found in the care plan section) Progress towards PT goals: Progressing toward goals    Frequency       PT Plan Current plan remains appropriate    Co-evaluation             End of Session Equipment Utilized During Treatment: Back brace Activity Tolerance: Patient tolerated treatment well Patient left: in bed;with call bell/phone within reach     Time: 1039-1056 PT Time Calculation (min) (ACUTE ONLY): 17 min  Charges:  $Gait Training: 8-22 mins  G Codes:      [place],  Beth 03/31/2015, 11:48 AM  [place] , PT [phone]   

## 2015-03-31 NOTE — Progress Notes (Signed)
Pt discharging at this time with husband home taking all personal belongings. IV discontinued, dry dressing applied. Discharge instructions and prescriptions provided with verbal understanding. No complaints of pain or discomfort. No noted distress.

## 2015-03-31 NOTE — Progress Notes (Signed)
Occupational Therapy Treatment/Discharge Patient Details Name: Regina Hall MRN: [id number] DOB: 1934-01-29 Today's Date: 03/31/2015    History of present illness 81 y.o. s/p L4-L5 decompressive laminectomy , posterior lumbar interbody arthrodesis with peek spacers local autograft and allograft. PMH includes back surgery x2, HTN, arthritis, anemia, bladder incontinence, PONV, scoliosis, anxiety, left hip surgery, and Rt TKA.    OT comments  Pt progressing very well towards occupational therapy goals. Pt completed all ADL tasks and functional transfers with min assist-supervision level. Pt demonstrated good adherence to back precautions and reviewed energy conservation, pain/edema management, and fall prevention strategies with pt. All education has been completed and pt has no further questions. Pt with no further acute OT needs. OT signing off.   Follow Up Recommendations  No OT follow up;Supervision - Intermittent    Equipment Recommendations  None recommended by OT    Recommendations for Other Services      Precautions / Restrictions Precautions Precautions: Back;Fall Precaution Booklet Issued: No Precaution Comments: Pt able to recall all precautions and compensatory strategies Required Braces or Orthoses: Spinal Brace Spinal Brace: Lumbar corset;Applied in sitting position Restrictions Weight Bearing Restrictions: No       Mobility Bed Mobility Overal bed mobility: Needs Assistance Bed Mobility: Sidelying to Sit;Sit to Sidelying;Rolling Rolling: Supervision       Sit to sidelying: Supervision General bed mobility comments: Pt sitting EOB on OT arrival  Transfers Overall transfer level: Needs assistance Equipment used: Rolling walker (2 wheeled) Transfers: Sit to/from Stand Sit to Stand: Supervision         General transfer comment: Supervision for safety. Good demonstration of safe hand placement and body positioning with RW.    Balance Overall balance  assessment: Needs assistance Sitting-balance support: No upper extremity supported;Feet supported Sitting balance-Leahy Scale: Good     Standing balance support: Bilateral upper extremity supported;During functional activity Standing balance-Leahy Scale: Poor Standing balance comment: Reliant on UE support from RW to maintain balance                   ADL Overall ADL's : Needs assistance/impaired     Grooming: Wash/dry hands;Supervision/safety;Standing   Upper Body Bathing: Supervision/ safety;Sitting   Lower Body Bathing: Minimal assistance;Sit to/from stand;Cueing for compensatory techniques Lower Body Bathing Details (indicate cue type and reason): Able to cross one ankle-over-knee; educated on AE Upper Body Dressing : Minimal assistance;Standing Upper Body Dressing Details (indicate cue type and reason): to don back brace in standing; pt able to don clothing with set up Lower Body Dressing: Minimal assistance;Sit to/from stand;Cueing for compensatory techniques Lower Body Dressing Details (indicate cue type and reason): only able to cross one ankle-over-knee; assist with starting underwear/pants/socks over feet Toilet Transfer: Supervision/safety;Ambulation;BSC;RW   Toileting- Clothing Manipulation and Hygiene: Supervision/safety;Sit to/from stand       Functional mobility during ADLs: Supervision/safety;Rolling walker General ADL Comments: Reviewed back precautions, positioning for sleep, pain/edema management, use of AE for LB ADLs, compensatory strategies for ADLs, energy conservation, and fall prevention strategies.      Vision                     Perception     Praxis      Cognition   Behavior During Therapy: [facility] for tasks assessed/performed Overall Cognitive Status: Within Functional Limits for tasks assessed                       Extremity/Trunk Assessment  Exercises     Shoulder Instructions       General  Comments      Pertinent Vitals/ Pain       Pain Assessment: 0-10 Pain Score: 5  Pain Location: back Pain Descriptors / Indicators: Aching Pain Intervention(s): Limited activity within patient's tolerance;Monitored during session;Repositioned  Home Living                                          Prior Functioning/Environment              Frequency       Progress Toward Goals  OT Goals(current goals can now be found in the care plan section)  Progress towards OT goals: Goals met/education completed, patient discharged from OT  Acute Rehab OT Goals Patient Stated Goal: to go home, get stronger in her legs OT Goal Formulation: With patient Time For Goal Achievement: 04/05/15 Potential to Achieve Goals: Good ADL Goals Pt Will Perform Grooming: with set-up;standing Pt Will Perform Lower Body Dressing: with set-up;with supervision;sit to/from stand;with adaptive equipment Pt Will Transfer to Toilet: with supervision;ambulating;with set-up Pt Will Perform Toileting - Clothing Manipulation and hygiene: sit to/from stand;with modified independence Pt Will Perform Tub/Shower Transfer: Shower transfer;shower seat;rolling walker;with set-up;with supervision Additional ADL Goal #1: pt will independently verbalize 3/3 back precautions and maintain in session. Additional ADL Goal #2: pt will don/doff back brace with set up assistance.  Plan All goals met and education completed, patient discharged from OT services    Co-evaluation                 End of Session Equipment Utilized During Treatment: Gait belt;Rolling walker;Back brace   Activity Tolerance Patient tolerated treatment well   Patient Left in chair;with call bell/phone within reach   Nurse Communication Mobility status        Time: 6195-0932 OT Time Calculation (min): 18 min  Charges:    Redmond Baseman, OTR/L Pager: [phone] 03/31/2015, 12:26 PM

## 2015-03-31 NOTE — Discharge Summary (Signed)
Physician Discharge Summary  Patient ID: Regina Hall MRN: [id number] DOB/AGE: Oct 28, 1934 81 y.o.  Admit date: 03/28/2015 Discharge date: 03/31/2015  Admission DiagnosesSpondylolisthesis  L4-L5 with lumbar radiculopathy  Discharge Diagnoses: Spondylolisthesis L4-L5 with lumbar radiculopathy, scoliosis Active Problems:   Scoliosis of lumbar spine   Discharged Condition: good  Hospital Course: He was admitted to undergo surgical decompression which she tolerated well.  Consults: None  Significant Diagnostic Studies: None  Treatments: surgery: Decompression fusion L4-L5  Discharge Exam: Blood pressure 116/60, pulse 76, temperature 98.6 F (37 C), temperature source Oral, resp. rate 16, height 5\' 6"  (1.676 m), weight 94.8 kg (208 lb 15.9 oz), SpO2 99 %. Incision is clean and dry motor function is intact in lower extremities  Disposition: 01-Home or Self Care  Discharge Instructions    Call MD for:  redness, tenderness, or signs of infection (pain, swelling, redness, odor or green/yellow discharge around incision site)    Complete by:  As directed      Call MD for:  severe uncontrolled pain    Complete by:  As directed      Call MD for:  temperature >100.4    Complete by:  As directed      Diet - low sodium heart healthy    Complete by:  As directed      Discharge instructions    Complete by:  As directed   Okay to shower. Do not apply salves or appointments to incision. No heavy lifting with the upper extremities greater than 15 pounds. May resume driving when not requiring pain medication and patient feels comfortable with doing so.     Increase activity slowly    Complete by:  As directed             Medication List    TAKE these medications        Biotin 10 MG Tabs  Take 10 mg by mouth every morning.     CHIA OIL PO  Take 15 mLs by mouth at bedtime.     COLLAGEN PO  Take 1 capsule by mouth daily.     diazepam 5 MG tablet  Commonly known as:  VALIUM   Take 1 tablet (5 mg total) by mouth every 6 (six) hours as needed for muscle spasms.     Fish Oil 1000 MG Caps  Take 1,000 mg by mouth daily.     Glucosamine Sulfate 1000 MG Caps  Take 2,000 mg by mouth daily.     HYDROcodone-acetaminophen 10-325 MG tablet  Commonly known as:  NORCO  Take 0.5-1 tablets by mouth daily as needed for moderate pain.     HYDROcodone-acetaminophen 5-325 MG tablet  Commonly known as:  NORCO/VICODIN  Take 1-2 tablets by mouth every 4 (four) hours as needed (breakthrough pain).     losartan 25 MG tablet  Commonly known as:  COZAAR  Take 25 mg by mouth every morning.     ondansetron 4 MG tablet  Commonly known as:  ZOFRAN  Take 1 tablet (4 mg total) by mouth every 6 (six) hours as needed for nausea.     OVER THE COUNTER MEDICATION  Take 1 tablet by mouth daily as needed (when around people that are sick/ cold weather.). congutlex     OVER THE COUNTER MEDICATION  Take 1 capsule by mouth daily as needed (for cold). Blue Ice Cod Liver Oil     oxyCODONE-acetaminophen 5-325 MG tablet  Commonly known as:  PERCOCET/ROXICET  Take 1-2 tablets  by mouth every 4 (four) hours as needed for moderate pain.     SINUS RELIEF NA  Place 1-2 sprays into both nostrils as needed (for congestion).     THERATEARS OP  Place 2 drops into both eyes as needed (for dry eyes).     triamterene-hydrochlorothiazide 37.5-25 MG capsule  Commonly known as:  DYAZIDE  Take 1 capsule by mouth every morning.     Vitamin D3 5000 units Tabs  Take 5,000 Units by mouth daily.         SignedEarleen Newport 03/31/2015, 9:03 AM

## 2015-04-08 ENCOUNTER — Emergency Department (HOSPITAL_COMMUNITY)
Admission: EM | Admit: 2015-04-08 | Discharge: 2015-04-08 | Disposition: A | Discharge status: Home / Self Care | Payer: Medicare Other | Attending: Emergency Medicine | Admitting: Emergency Medicine

## 2015-04-08 ENCOUNTER — Encounter (HOSPITAL_COMMUNITY): Payer: Self-pay | Admitting: Emergency Medicine

## 2015-04-08 ENCOUNTER — Emergency Department (HOSPITAL_COMMUNITY): Payer: Medicare Other

## 2015-04-08 DIAGNOSIS — M1990 Unspecified osteoarthritis, unspecified site: Secondary | ICD-10-CM | POA: Diagnosis not present

## 2015-04-08 DIAGNOSIS — Z79899 Other long term (current) drug therapy: Secondary | ICD-10-CM | POA: Diagnosis not present

## 2015-04-08 DIAGNOSIS — R1032 Left lower quadrant pain: Secondary | ICD-10-CM | POA: Diagnosis not present

## 2015-04-08 DIAGNOSIS — I10 Essential (primary) hypertension: Secondary | ICD-10-CM | POA: Diagnosis not present

## 2015-04-08 LAB — COMPREHENSIVE METABOLIC PANEL
ALT: 13 U/L — AB (ref 14–54)
AST: 18 U/L (ref 15–41)
Albumin: 3.5 g/dL (ref 3.5–5.0)
Alkaline Phosphatase: 72 U/L (ref 38–126)
Anion gap: 7 (ref 5–15)
BILIRUBIN TOTAL: 0.7 mg/dL (ref 0.3–1.2)
BUN: 30 mg/dL — ABNORMAL HIGH (ref 6–20)
CALCIUM: 9.1 mg/dL (ref 8.9–10.3)
CHLORIDE: 105 mmol/L (ref 101–111)
CO2: 25 mmol/L (ref 22–32)
CREATININE: 1.01 mg/dL — AB (ref 0.44–1.00)
GFR, EST NON AFRICAN AMERICAN: 52 mL/min — AB (ref >60)
Glucose, Bld: 93 mg/dL (ref 65–99)
Potassium: 3.9 mmol/L (ref 3.5–5.1)
Sodium: 137 mmol/L (ref 135–145)
TOTAL PROTEIN: 6.7 g/dL (ref 6.5–8.1)

## 2015-04-08 LAB — CBC WITH DIFFERENTIAL/PLATELET
BASOS ABS: 0 10*3/uL (ref 0.0–0.1)
BASOS PCT: 0 %
Eosinophils Absolute: 0.8 10*3/uL — ABNORMAL HIGH (ref 0.0–0.7)
Eosinophils Relative: 8 %
HEMATOCRIT: 35.9 % — AB (ref 36.0–46.0)
Hemoglobin: 12.1 g/dL (ref 12.0–15.0)
LYMPHS PCT: 27 %
Lymphs Abs: 2.5 10*3/uL (ref 0.7–4.0)
MCH: 31.8 pg (ref 26.0–34.0)
MCHC: 33.7 g/dL (ref 30.0–36.0)
MCV: 94.5 fL (ref 78.0–100.0)
MONO ABS: 0.7 10*3/uL (ref 0.1–1.0)
Monocytes Relative: 8 %
NEUTROS ABS: 5.4 10*3/uL (ref 1.7–7.7)
NEUTROS PCT: 57 %
PLATELETS: 185 10*3/uL (ref 150–400)
RBC: 3.8 MIL/uL — AB (ref 3.87–5.11)
RDW: 12.9 % (ref 11.5–15.5)
WBC: 9.5 10*3/uL (ref 4.0–10.5)

## 2015-04-08 LAB — URINALYSIS, ROUTINE W REFLEX MICROSCOPIC
BILIRUBIN URINE: NEGATIVE
GLUCOSE, UA: NEGATIVE mg/dL
HGB URINE DIPSTICK: NEGATIVE
KETONES UR: NEGATIVE mg/dL
Leukocytes, UA: NEGATIVE
Nitrite: NEGATIVE
PROTEIN: NEGATIVE mg/dL
Specific Gravity, Urine: 1.01 (ref 1.005–1.030)
pH: 7.5 (ref 5.0–8.0)

## 2015-04-08 LAB — LIPASE, BLOOD: LIPASE: 30 U/L (ref 11–51)

## 2015-04-08 MED ORDER — IOHEXOL 300 MG/ML  SOLN
50.0000 mL | Freq: Once | INTRAMUSCULAR | Status: AC | PRN
  Administered 2015-04-08: 50 mL via ORAL

## 2015-04-08 MED ORDER — ENOXAPARIN SODIUM 100 MG/ML ~~LOC~~ SOLN
1.0000 mg/kg | Freq: Once | SUBCUTANEOUS | Status: AC
  Administered 2015-04-08: 90 mg via SUBCUTANEOUS
  Filled 2015-04-08: qty 1

## 2015-04-08 MED ORDER — SODIUM CHLORIDE 0.9 % IV BOLUS (SEPSIS)
500.0000 mL | Freq: Once | INTRAVENOUS | Status: AC
  Administered 2015-04-08: 500 mL via INTRAVENOUS

## 2015-04-08 MED ORDER — IOHEXOL 300 MG/ML  SOLN
100.0000 mL | Freq: Once | INTRAMUSCULAR | Status: AC | PRN
  Administered 2015-04-08: 100 mL via INTRAVENOUS

## 2015-04-08 MED ORDER — FENTANYL CITRATE (PF) 100 MCG/2ML IJ SOLN
50.0000 ug | Freq: Once | INTRAMUSCULAR | Status: AC
  Administered 2015-04-08: 50 ug via INTRAVENOUS
  Filled 2015-04-08: qty 2

## 2015-04-08 NOTE — ED Provider Notes (Signed)
CSN: [id number]     Arrival date & time 04/08/15  1509 History   First MD Initiated Contact with Patient 04/08/15 1705     Chief Complaint  Patient presents with  . Groin Pain     (Consider location/radiation/quality/duration/timing/severity/associated sxs/prior Treatment) HPI.... Status post 03/28/15   L4-5 posterior lumbar interbody fusion by Dr. [name].  Patient has been doing  well postoperatively. She now complains of pain in her left lower quadrant. Pain is positional.  She is ambulatory and eating well. No fever, sweats, chills, dysuria, change in bowel habits. Severity of pain is mild to moderate. Past Medical History  Diagnosis Date  . Hypertension   . Arthritis     KNEES AND BACK  . Anemia   . Bladder incontinence   . PONV (postoperative nausea and vomiting)     PONV X 1 EPISODE, COMES OUT OF ANESTHESIA FAST, SOME AWARENESS DURING END OF COLONSCOPY  . Scoliosis   . Anxiety    Past Surgical History  Procedure Laterality Date  . Joint replacement      2011 LT HIP  . Tubal ligation      1974  . Abdominal hysterectomy      2006 VAG HYST  . Back surgery      LOWER, X2  . Total knee arthroplasty Right 04/27/2014    Procedure: RIGH TOTAL KNEE ARTHROPLASTY;  Surgeon: Rod Can, MD;  Location: [facility];  Service: Orthopedics;  Laterality: Right;  . Tonsillectomy     History reviewed. No pertinent family history. Social History  Substance Use Topics  . Smoking status: Never Smoker   . Smokeless tobacco: Never Used  . Alcohol Use: Yes     Comment: OCC WINE   OB History    No data available     Review of Systems  All other systems reviewed and are negative.     Allergies  Penicillins  Home Medications   Prior to Admission medications   Medication Sig Start Date End Date Taking? Authorizing Provider  Bacillus Coagulans-Inulin (PROBIOTIC FORMULA) 1-250 BILLION-MG CAPS Take 1 capsule by mouth daily.   Yes Historical Provider, MD  Biotin 10 MG TABS  Take 10 mg by mouth every morning.    Yes Historical Provider, MD  Carboxymethylcellulose Sodium (THERATEARS OP) Place 2 drops into both eyes as needed (for dry eyes).   Yes Historical Provider, MD  Cholecalciferol (VITAMIN D3) 5000 units TABS Take 5,000 Units by mouth daily.   Yes Historical Provider, MD  COLLAGEN PO Take 1 capsule by mouth daily.   Yes Historical Provider, MD  diazepam (VALIUM) 5 MG tablet Take 1 tablet (5 mg total) by mouth every 6 (six) hours as needed for muscle spasms. 03/31/15  Yes [name], MD  Glucosamine Sulfate 1000 MG CAPS Take 2,000 mg by mouth daily.   Yes Historical Provider, MD  losartan (COZAAR) 25 MG tablet Take 25 mg by mouth every morning.   Yes Historical Provider, MD  OVER THE COUNTER MEDICATION Take 1 tablet by mouth daily as needed (when around people that are sick/ cold weather.). congutlex   Yes Historical Provider, MD  OVER THE COUNTER MEDICATION Take 1 capsule by mouth daily as needed (for cold). [place] Liver Oil   Yes Historical Provider, MD  oxyCODONE-acetaminophen (PERCOCET/ROXICET) 5-325 MG tablet Take 1-2 tablets by mouth every 4 (four) hours as needed for moderate pain. 03/31/15  Yes [name], MD  Oxymetazoline HCl (SINUS RELIEF NA) Place 1-2 sprays into  both nostrils as needed (for congestion).   Yes Historical Provider, MD  triamterene-hydrochlorothiazide (DYAZIDE) 37.5-25 MG per capsule Take 1 capsule by mouth every morning.    Yes Historical Provider, MD  HYDROcodone-acetaminophen (NORCO) 10-325 MG tablet Take 0.5-1 tablets by mouth daily as needed for moderate pain.    Historical Provider, MD   BP 134/78 mmHg  Pulse 63  Temp(Src) 98.4 F (36.9 C) (Oral)  Resp 17  Ht 5\' 6"  (1.676 m)  Wt 195 lb (88.451 kg)  BMI 31.49 kg/m2  SpO2 100% Physical Exam  Constitutional: She is oriented to person, place, and time. She appears well-developed and well-nourished.  HENT:  Head: Normocephalic and atraumatic.  Eyes: Conjunctivae and EOM  are normal. Pupils are equal, round, and reactive to light.  Neck: Normal range of motion. Neck supple.  Cardiovascular: Normal rate and regular rhythm.   Pulmonary/Chest: Effort normal and breath sounds normal.  Abdominal: Soft. Bowel sounds are normal.  Minimal left lower quadrant tenderness. She is nontender over her femoral artery or vein.  Musculoskeletal: Normal range of motion.  Neurological: She is alert and oriented to person, place, and time.  Skin: Skin is warm and dry.  Psychiatric: She has a normal mood and affect. Her behavior is normal.  Nursing note and vitals reviewed.   ED Course  Procedures (including critical care time) Labs Review Labs Reviewed  CBC WITH DIFFERENTIAL/PLATELET - Abnormal; Notable for the following:    RBC 3.80 (*)    HCT 35.9 (*)    Eosinophils Absolute 0.8 (*)    All other components within normal limits  COMPREHENSIVE METABOLIC PANEL - Abnormal; Notable for the following:    BUN 30 (*)    Creatinine, Ser 1.01 (*)    ALT 13 (*)    GFR calc non Af Amer 52 (*)    All other components within normal limits  LIPASE, BLOOD  URINALYSIS, ROUTINE W REFLEX MICROSCOPIC (NOT AT [facility])    Imaging Review Ct Abdomen Pelvis W Contrast  04/08/2015  CLINICAL DATA:  LEFT-sided groin pain for 3 days. Increased with palpation and with weight-bearing. Recent lumbar fusion. EXAM: CT ABDOMEN AND PELVIS WITH CONTRAST TECHNIQUE: Multidetector CT imaging of the abdomen and pelvis was performed using the standard protocol following bolus administration of intravenous contrast. CONTRAST:  54mL OMNIPAQUE IOHEXOL 300 MG/ML SOLN, 195mL OMNIPAQUE IOHEXOL 300 MG/ML SOLN COMPARISON:  None. FINDINGS: Lower chest:  No acute findings.  Mild scarring at the lung bases. Hepatobiliary: No masses or other significant abnormality. Pancreas: No mass, inflammatory changes, or other significant abnormality. Spleen: Within normal limits in size and appearance. Adrenals/Urinary Tract: No  masses identified. No evidence of hydronephrosis. Stomach/Bowel: No evidence of obstruction, inflammatory process, or abnormal fluid collections. Normal appendix. No visible LEFT groin hernia. Vascular/Lymphatic: No pathologically enlarged lymph nodes. No evidence of abdominal aortic aneurysm. Reproductive: No mass or other significant abnormality. Other: None. Musculoskeletal: No suspicious bone lesions identified. Unremarkable appearing L4-L5 interbody and posterior fusion. Solid T12-L1 fusion. Mild stranding of the posterior soft tissues is an expected postoperative finding. Mild scoliosis. The patient has undergone LEFT total hip arthroplasty. The LEFT femoral vein is obscured by Hounsfield artifact but on image 73 series 2 for instance, there is possible intraluminal thrombus. Correlate clinically for LEFT femoral deep venous thrombosis. No similar findings in the iliac vein as can best be assessed on this exam. Consider LEFT lower extremity venous Doppler for further evaluation. There is severe RIGHT hip osteoarthritis with bone on bone.  IMPRESSION: No visible LEFT groin hernia. No visible postsurgical complication related to 99991111 fusion. Cannot exclude LEFT femoral vein deep venous thrombosis. This area is largely obscured by Hounsfield artifact but could be definitively evaluated with LEFT lower extremity venous Dopplers. Electronically Signed   By: Staci Righter M.D.   On: 04/08/2015 20:42   I have personally reviewed and evaluated these images and lab results as part of my medical decision-making.   EKG Interpretation None      MDM   Final diagnoses:  LLQ pain    I discussed CT scan A/P with Dr Jola Baptist.  Patient is nontender over the left femoral vein.  However, since she is postop, I will give her Lovenox 1 mg/kg subcutaneous and order a Doppler study of her left lower extremity to assess the femoral vein. All this was discussed with the patient and her husband.    Nat Christen,  MD 04/08/15 [phone]

## 2015-04-08 NOTE — ED Notes (Signed)
Pt had back surgery last week, Pt reports pain in her left inguinal area starting 2 days ago. Pt denies any associated symptoms.

## 2015-04-08 NOTE — ED Notes (Signed)
PT stated left sided groin pain x3 days and hurts to the touch and with weight bearing. PT stated she had lumbar fusion surgery at [facility] on 3/13 and was d/c on 03/31/15 and was told to come to ED bc this pain was not related to her past surgery per the physician office staff. PT denies any urinary symptoms, n/v/d and last BM was 04/07/15.

## 2015-04-08 NOTE — ED Notes (Signed)
Patient went to bathroom but did not provide urine specimen.  Advised patient that we still needed a urine specimen.

## 2015-04-08 NOTE — Discharge Instructions (Signed)
Blood work and CT scan showed no obvious serious medical issues.   Continue to take your pain medicine and muscle relaxer. Follow-up your primary care doctor or return here if getting worse in anyway.  Registered nurse will inform you what time to return tomorrow for your Doppler study to rule out a blood clot in your vein

## 2015-04-09 ENCOUNTER — Inpatient Hospital Stay (HOSPITAL_COMMUNITY): Admit: 2015-04-09 | Payer: Medicare Other

## 2015-04-09 ENCOUNTER — Ambulatory Visit (HOSPITAL_COMMUNITY)
Admission: RE | Admit: 2015-04-09 | Discharge: 2015-04-09 | Disposition: A | Discharge status: Home / Self Care | Payer: Medicare Other | Source: Ambulatory Visit | Attending: Emergency Medicine | Admitting: Emergency Medicine

## 2015-04-09 DIAGNOSIS — M7122 Synovial cyst of popliteal space [Baker], left knee: Secondary | ICD-10-CM | POA: Diagnosis not present

## 2015-04-09 DIAGNOSIS — R1032 Left lower quadrant pain: Secondary | ICD-10-CM | POA: Insufficient documentation

## 2015-04-09 NOTE — ED Provider Notes (Signed)
U/s shows no DVT. Does show baker's cyst, no rupture. She reports most of her pain is in the knee area. Ice, elevation, tylenol and f/u with PCP  Results for orders placed or performed during the hospital encounter of 04/08/15  CBC with Differential  Result Value Ref Range   WBC 9.5 4.0 - 10.5 K/uL   RBC 3.80 (L) 3.87 - 5.11 MIL/uL   Hemoglobin 12.1 12.0 - 15.0 g/dL   HCT 35.9 (L) 36.0 - 46.0 %   MCV 94.5 78.0 - 100.0 fL   MCH 31.8 26.0 - 34.0 pg   MCHC 33.7 30.0 - 36.0 g/dL   RDW 12.9 11.5 - 15.5 %   Platelets 185 150 - 400 K/uL   Neutrophils Relative % 57 %   Neutro Abs 5.4 1.7 - 7.7 K/uL   Lymphocytes Relative 27 %   Lymphs Abs 2.5 0.7 - 4.0 K/uL   Monocytes Relative 8 %   Monocytes Absolute 0.7 0.1 - 1.0 K/uL   Eosinophils Relative 8 %   Eosinophils Absolute 0.8 (H) 0.0 - 0.7 K/uL   Basophils Relative 0 %   Basophils Absolute 0.0 0.0 - 0.1 K/uL  Comprehensive metabolic panel  Result Value Ref Range   Sodium 137 135 - 145 mmol/L   Potassium 3.9 3.5 - 5.1 mmol/L   Chloride 105 101 - 111 mmol/L   CO2 25 22 - 32 mmol/L   Glucose, Bld 93 65 - 99 mg/dL   BUN 30 (H) 6 - 20 mg/dL   Creatinine, Ser 1.01 (H) 0.44 - 1.00 mg/dL   Calcium 9.1 8.9 - 10.3 mg/dL   Total Protein 6.7 6.5 - 8.1 g/dL   Albumin 3.5 3.5 - 5.0 g/dL   AST 18 15 - 41 U/L   ALT 13 (L) 14 - 54 U/L   Alkaline Phosphatase 72 38 - 126 U/L   Total Bilirubin 0.7 0.3 - 1.2 mg/dL   GFR calc non Af Amer 52 (L) >60 mL/min   GFR calc Af Amer >60 >60 mL/min   Anion gap 7 5 - 15  Lipase, blood  Result Value Ref Range   Lipase 30 11 - 51 U/L  Urinalysis, Routine w reflex microscopic  Result Value Ref Range   Color, Urine YELLOW YELLOW   APPearance CLEAR CLEAR   Specific Gravity, Urine 1.010 1.005 - 1.030   pH 7.5 5.0 - 8.0   Glucose, UA NEGATIVE NEGATIVE mg/dL   Hgb urine dipstick NEGATIVE NEGATIVE   Bilirubin Urine NEGATIVE NEGATIVE   Ketones, ur NEGATIVE NEGATIVE mg/dL   Protein, ur NEGATIVE NEGATIVE mg/dL    Nitrite NEGATIVE NEGATIVE   Leukocytes, UA NEGATIVE NEGATIVE   Dg Lumbar Spine 2-3 Views  03/28/2015  CLINICAL DATA:  Intraoperative localization for spine surgery. EXAM: LUMBAR SPINE - 2-3 VIEW; DG C-ARM 61-120 MIN COMPARISON:  MRI lumbar spine 02/17/2015 FINDINGS: Fluoroscopic spot images demonstrate pedicle screws, posterior rods and interbody fusion device at L4-5. Good position and alignment without complicating features. IMPRESSION: Posterior and interbody fusion changes at L4-5. Electronically Signed   By: Marijo Sanes M.D.   On: 03/28/2015 13:07   Ct Abdomen Pelvis W Contrast  04/08/2015  CLINICAL DATA:  LEFT-sided groin pain for 3 days. Increased with palpation and with weight-bearing. Recent lumbar fusion. EXAM: CT ABDOMEN AND PELVIS WITH CONTRAST TECHNIQUE: Multidetector CT imaging of the abdomen and pelvis was performed using the standard protocol following bolus administration of intravenous contrast. CONTRAST:  72mL OMNIPAQUE IOHEXOL 300 MG/ML SOLN,  130mL OMNIPAQUE IOHEXOL 300 MG/ML SOLN COMPARISON:  None. FINDINGS: Lower chest:  No acute findings.  Mild scarring at the lung bases. Hepatobiliary: No masses or other significant abnormality. Pancreas: No mass, inflammatory changes, or other significant abnormality. Spleen: Within normal limits in size and appearance. Adrenals/Urinary Tract: No masses identified. No evidence of hydronephrosis. Stomach/Bowel: No evidence of obstruction, inflammatory process, or abnormal fluid collections. Normal appendix. No visible LEFT groin hernia. Vascular/Lymphatic: No pathologically enlarged lymph nodes. No evidence of abdominal aortic aneurysm. Reproductive: No mass or other significant abnormality. Other: None. Musculoskeletal: No suspicious bone lesions identified. Unremarkable appearing L4-L5 interbody and posterior fusion. Solid T12-L1 fusion. Mild stranding of the posterior soft tissues is an expected postoperative finding. Mild scoliosis. The patient  has undergone LEFT total hip arthroplasty. The LEFT femoral vein is obscured by Hounsfield artifact but on image 73 series 2 for instance, there is possible intraluminal thrombus. Correlate clinically for LEFT femoral deep venous thrombosis. No similar findings in the iliac vein as can best be assessed on this exam. Consider LEFT lower extremity venous Doppler for further evaluation. There is severe RIGHT hip osteoarthritis with bone on bone. IMPRESSION: No visible LEFT groin hernia. No visible postsurgical complication related to 99991111 fusion. Cannot exclude LEFT femoral vein deep venous thrombosis. This area is largely obscured by Hounsfield artifact but could be definitively evaluated with LEFT lower extremity venous Dopplers. Electronically Signed   By: Staci Righter M.D.   On: 04/08/2015 20:42   Dg Lumbar Spine 1 View  03/28/2015  CLINICAL DATA:  Intraoperative localization prior to L4-5 PLIF. EXAM: LUMBAR SPINE - 1 VIEW COMPARISON:  MRI lumbar spine 02/17/2015. Lumbar spine x-rays 02/03/2015. Lumbar CT myelogram 10/28/2012. FINDINGS: The same numbering scheme will be used as on the prior MRI with 5 non rib-bearing lumbar vertebrae. Clamps are present on the spinous processes of L5 and posteriorly at the S1 level. IMPRESSION: L5 and S1 localized intraoperatively. Electronically Signed   By: Evangeline Dakin M.D.   On: 03/28/2015 13:07   US Venous Img Lower Unilateral Left  04/09/2015  CLINICAL DATA:  81 year old female with left lower quadrant pain with possible left femoral vein DVT on a prior CT scan EXAM: LEFT LOWER EXTREMITY VENOUS DOPPLER ULTRASOUND TECHNIQUE: Gray-scale sonography with graded compression, as well as color Doppler and duplex ultrasound were performed to evaluate the lower extremity deep venous systems from the level of the common femoral vein and including the common femoral, femoral, profunda femoral, popliteal and calf veins including the posterior tibial, peroneal and gastrocnemius  veins when visible. The superficial great saphenous vein was also interrogated. Spectral Doppler was utilized to evaluate flow at rest and with distal augmentation maneuvers in the common femoral, femoral and popliteal veins. COMPARISON:  CT abdomen/pelvis 04/08/2015 FINDINGS: Contralateral Common Femoral Vein: Respiratory phasicity is normal and symmetric with the symptomatic side. No evidence of thrombus. Normal compressibility. Common Femoral Vein: No evidence of thrombus. Normal compressibility, respiratory phasicity and response to augmentation. Saphenofemoral Junction: No evidence of thrombus. Normal compressibility and flow on color Doppler imaging. Profunda Femoral Vein: No evidence of thrombus. Normal compressibility and flow on color Doppler imaging. Femoral Vein: No evidence of thrombus. Normal compressibility, respiratory phasicity and response to augmentation. Popliteal Vein: No evidence of thrombus. Normal compressibility, respiratory phasicity and response to augmentation. Calf Veins: No evidence of thrombus. Normal compressibility and flow on color Doppler imaging. Superficial Great Saphenous Vein: No evidence of thrombus. Normal compressibility and flow on color Doppler imaging. Venous Reflux:  None. Other Findings: Well-defined fluid collection in the popliteal fossa measures 4.2 x 1.4 x 3.4 cm. The imaging appearance is consistent with a Baker's cyst. IMPRESSION: 1. No evidence of left lower extremity DVT. 2. Sonographically simple Baker's cyst. Electronically Signed   By: Jacqulynn Cadet M.D.   On: 04/09/2015 10:21   Dg C-arm 1-60 Min  03/28/2015  CLINICAL DATA:  Intraoperative localization for spine surgery. EXAM: LUMBAR SPINE - 2-3 VIEW; DG C-ARM 61-120 MIN COMPARISON:  MRI lumbar spine 02/17/2015 FINDINGS: Fluoroscopic spot images demonstrate pedicle screws, posterior rods and interbody fusion device at L4-5. Good position and alignment without complicating features. IMPRESSION: Posterior  and interbody fusion changes at L4-5. Electronically Signed   By: Marijo Sanes M.D.   On: 03/28/2015 13:07      Sherwood Gambler, MD 04/09/15 1057

## 2015-06-27 ENCOUNTER — Encounter: Payer: Self-pay | Admitting: Vascular Surgery

## 2015-06-28 ENCOUNTER — Other Ambulatory Visit: Payer: Self-pay | Admitting: *Deleted

## 2015-06-28 DIAGNOSIS — R609 Edema, unspecified: Secondary | ICD-10-CM

## 2015-07-06 ENCOUNTER — Ambulatory Visit (INDEPENDENT_AMBULATORY_CARE_PROVIDER_SITE_OTHER): Payer: Medicare Other | Admitting: Vascular Surgery

## 2015-07-06 ENCOUNTER — Ambulatory Visit (HOSPITAL_COMMUNITY)
Admission: RE | Admit: 2015-07-06 | Discharge: 2015-07-06 | Disposition: A | Discharge status: Home / Self Care | Payer: Medicare Other | Source: Ambulatory Visit | Attending: Vascular Surgery | Admitting: Vascular Surgery

## 2015-07-06 ENCOUNTER — Encounter: Payer: Self-pay | Admitting: Vascular Surgery

## 2015-07-06 VITALS — BP 138/62 | HR 90 | Ht 66.0 in | Wt 210.0 lb

## 2015-07-06 DIAGNOSIS — R609 Edema, unspecified: Secondary | ICD-10-CM | POA: Diagnosis present

## 2015-07-06 DIAGNOSIS — I872 Venous insufficiency (chronic) (peripheral): Secondary | ICD-10-CM | POA: Diagnosis not present

## 2015-07-06 DIAGNOSIS — I8391 Asymptomatic varicose veins of right lower extremity: Secondary | ICD-10-CM | POA: Diagnosis not present

## 2015-07-06 NOTE — Progress Notes (Signed)
HISTORY AND PHYSICAL     CC:  Swollen legs and feet with pain Referring Provider:  Deloria Lair., MD  HPI: This is a 81 y.o. female who presents today with complaints of swelling in both her legs, which she states has been going on for a couple of years and has progressively gotten worse.  She states that she does not have visible varicose veins.  She states that her brother has some issues with his legs turning red and getting swollen, but unsure of what this is.  She has a hx of 3 pregnancies in the past. 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    She states that it feels as if she has a band around her legs applying pressure.  She also has complaints of left lateral leg pain from the hip to the knee.  She had her left knee injected a couple of months ago.  She says that because of this pain, she is having trouble getting around and is using a wheelchair.   She has a hx of left hip replacement in 2012, right knee replacement in 2015 and back surgery in 2016.  She states that she had spider veins injected in her bilateral thighs many years ago.   She states that she has tried elevation.  She sometimes will lay flat and sometimes sit in a chair to elevate her legs.  She has tried compression in the past, but her legs have swollen more and she is not able to use her compression socks.    She denies having abdominal surgery, groin surgery or any hx of radiation therapy.    She is on an ARB & diuretic for blood pressure management.    Past Medical History  Diagnosis Date  . Hypertension   . Arthritis     KNEES AND BACK  . Anemia   . Bladder incontinence   . PONV (postoperative nausea and vomiting)     PONV X 1 EPISODE, COMES OUT OF ANESTHESIA FAST, SOME AWARENESS DURING END OF COLONSCOPY  . Scoliosis   . Anxiety     Past Surgical History  Procedure Laterality Date  . Joint replacement      2011 LT HIP  . Tubal ligation      1974  . Abdominal  hysterectomy      2006 VAG HYST  . Back surgery      LOWER, X2  . Total knee arthroplasty Right 04/27/2014    Procedure: [place] TOTAL KNEE ARTHROPLASTY;  Surgeon: Rod Can, MD;  Location: [facility];  Service: Orthopedics;  Laterality: Right;  . Tonsillectomy      Allergies  Allergen Reactions  . Penicillins Rash    Has patient had a PCN reaction causing immediate rash, facial/tongue/throat swelling, SOB or lightheadedness with hypotension: Yes Has patient had a PCN reaction causing severe rash involving mucus membranes or skin necrosis: No Has patient had a PCN reaction that required hospitalization No Has patient had a PCN reaction occurring within the last 10 years: No If all of the above answers are "NO", then may proceed with Cephalosporin use.     Current Outpatient Prescriptions  Medication Sig Dispense Refill  . Bacillus Coagulans-Inulin (PROBIOTIC FORMULA) 1-250 BILLION-MG CAPS Take 1 capsule by mouth daily. Reported on 07/06/2015    . Biotin 10 MG TABS Take 10 mg by mouth every morning.     . Carboxymethylcellulose Sodium (THERATEARS OP) Place 2 drops into both eyes as  needed (for dry eyes).    . Cholecalciferol (VITAMIN D3) 5000 units TABS Take 5,000 Units by mouth daily.    . COLLAGEN PO Take 1 capsule by mouth daily.    . diazepam (VALIUM) 5 MG tablet Take 1 tablet (5 mg total) by mouth every 6 (six) hours as needed for muscle spasms. 60 tablet 0  . Glucosamine Sulfate 1000 MG CAPS Take 2,000 mg by mouth daily.    Marland Kitchen HYDROcodone-acetaminophen (NORCO) 10-325 MG tablet Take 0.5-1 tablets by mouth daily as needed for moderate pain.    Marland Kitchen losartan (COZAAR) 25 MG tablet Take 25 mg by mouth every morning.    . triamterene-hydrochlorothiazide (DYAZIDE) 37.5-25 MG per capsule Take 1 capsule by mouth every morning.     Marland Kitchen OVER THE COUNTER MEDICATION Take 1 tablet by mouth daily as needed (when around people that are sick/ cold weather.). Reported on 07/06/2015    . OVER THE COUNTER  MEDICATION Take 1 capsule by mouth daily as needed (for cold). Reported on 07/06/2015    . oxyCODONE-acetaminophen (PERCOCET/ROXICET) 5-325 MG tablet Take 1-2 tablets by mouth every 4 (four) hours as needed for moderate pain. (Patient not taking: Reported on 07/06/2015) 60 tablet 0  . Oxymetazoline HCl (SINUS RELIEF NA) Place 1-2 sprays into both nostrils as needed (for congestion). Reported on 07/06/2015     No current facility-administered medications for this visit.   Facility-Administered Medications Ordered in Other Visits  Medication Dose Route Frequency Provider Last Rate Last Dose  . ondansetron (ZOFRAN) 4 mg in sodium chloride 0.9 % 50 mL IVPB  4 mg Intravenous Once Rod Can, MD        No family history on file.  Social History   Social History  . Marital Status: Married    Spouse Name: N/A  . Number of Children: N/A  . Years of Education: N/A   Occupational History  . Not on file.   Social History Main Topics  . Smoking status: Never Smoker   . Smokeless tobacco: Never Used  . Alcohol Use: Yes     Comment: OCC WINE  . Drug Use: No  . Sexual Activity: Not on file   Other Topics Concern  . Not on file   Social History Narrative     REVIEW OF SYSTEMS:   [X]  denotes positive finding, [ ]  denotes negative finding Cardiac  Comments:  Chest pain or chest pressure:    Shortness of breath upon exertion:    Short of breath when lying flat:    Irregular heart rhythm:        Vascular    Pain in calf, thigh, or hip brought on by ambulation:    Pain in feet at night that wakes you up from your sleep:  x   Blood clot in your veins:    Leg swelling:  x       Pulmonary    Oxygen at home:    Productive cough:     Wheezing:         Neurologic    Sudden weakness in arms or legs:  x See HPI  Sudden numbness in arms or legs:  x   Sudden onset of difficulty speaking or slurred speech:    Temporary loss of vision in one eye:     Problems with dizziness:           Gastrointestinal    Blood in stool:     Vomited blood:  Genitourinary    Burning when urinating:     Blood in urine:        Psychiatric    Major depression:         Hematologic    Bleeding problems:    Problems with blood clotting too easily:        Skin    Rashes or ulcers:        Constitutional    Fever or chills:      PHYSICAL EXAMINATION:  Filed Vitals:   07/06/15 1435  BP: 138/62  Pulse: 90   Body mass index is 33.91 kg/(m^2).  General:  WDWN in NAD; vital signs documented above Gait: Not observed HENT: WNL, normocephalic Pulmonary: normal non-labored breathing , without Rales, rhonchi,  wheezing Cardiac: regular HR, without  Murmurs, rubs or gallops; without carotid bruits Skin: without rashes Vascular Exam/Pulses:  Right Left  Radial 2+ (normal) 2+ (normal)  Ulnar 1+ (weak) 1+ (weak)  DP Unable to palpate  Unable to palpate   PT Unable to palpate  Unable to palpate    Extremities: without ischemic changes, without Gangrene , without cellulitis; without open wounds; +leg swelling bilaterally with buffalo hump on the dorsum of both feet.  There is some skin discoloration on the right leg consistent with venous insufficiency. Musculoskeletal: no muscle wasting or atrophy  Neurologic: A&O X 3;  No focal weakness or paresthesias are detected Psychiatric:  The pt has Normal affect.   Non-Invasive Vascular Imaging:   Lower Extremity Venous Duplex Evaluation 07/06/15: 1.  No evidence of deep vein thrombosis noted in the bilateral femoral-popliteal systems. 2.  No evidence of superficial vein thrombosis noted in the bilateral lower extremities.  3.  Venous incompetence is noted in the right common femoral and popliteal veins.  Pt meds includes: Statin:  No. Beta Blocker:  No. Aspirin:  No. ACEI:  No. ARB:  Yes.   Other Antiplatelet/Anticoagulant:  No.    ASSESSMENT/PLAN:: 81 y.o. female with bilateral leg swelling   -the pt has had  bilateral leg swelling for 2 years that has been progressively getting worse.  She does have some component of venous reflux in the deep venous system of the right leg as well as skin changes on the right that are consistent with this.   -she does not have any venous insufficiency by duplex on the left leg.  She does have evidence of lymphedema with buffalo hump on the dorsum of both feet.  She denies any abdominal or groin surgery, trauma, or radiation therapy.  -the treatment for lymphedema and chronic venous insufficiency are the same with leg elevation.  Dr. Scot Dock has discussed with the pt that she will need to lay with her back flat, and legs elevated with knees bent so that her feet are higher than her heart for 2-3 times per day for 20 minutes to help with the swelling.  She would also benefit from water aerobics and walking, but to avoid sitting and standing for long periods of time.  -She will need compression stockings, but she should start with leg elevation for a couple of weeks as she has a pair of compression stockings that no longer fit, but may be able to after elevating legs for a couple of weeks. -Dr. Scot Dock explained to her that this is a chronic problem and she will need to continue these therapies to try to keep them from worsening.   -she will return to see [place] as needed.  Leontine Locket, PA-C [facility] [phone]  Clinic MD:  Pt seen and examined in conjunction with Dr. Scot Dock

## 2015-08-30 ENCOUNTER — Encounter (HOSPITAL_COMMUNITY): Payer: Medicare Other

## 2015-08-30 ENCOUNTER — Encounter: Payer: Medicare Other | Admitting: Vascular Surgery

## 2016-08-21 DIAGNOSIS — G8929 Other chronic pain: Secondary | ICD-10-CM | POA: Insufficient documentation

## 2016-08-21 DIAGNOSIS — M549 Dorsalgia, unspecified: Secondary | ICD-10-CM

## 2016-09-13 ENCOUNTER — Ambulatory Visit (INDEPENDENT_AMBULATORY_CARE_PROVIDER_SITE_OTHER): Payer: Medicare Other | Admitting: Cardiovascular Disease

## 2016-09-13 ENCOUNTER — Encounter: Payer: Self-pay | Admitting: *Deleted

## 2016-09-13 ENCOUNTER — Encounter: Payer: Self-pay | Admitting: Cardiovascular Disease

## 2016-09-13 ENCOUNTER — Telehealth: Payer: Self-pay | Admitting: Cardiovascular Disease

## 2016-09-13 VITALS — BP 108/67 | HR 86 | Ht 66.0 in | Wt 216.0 lb

## 2016-09-13 DIAGNOSIS — R600 Localized edema: Secondary | ICD-10-CM

## 2016-09-13 DIAGNOSIS — I83893 Varicose veins of bilateral lower extremities with other complications: Secondary | ICD-10-CM | POA: Diagnosis not present

## 2016-09-13 DIAGNOSIS — I10 Essential (primary) hypertension: Secondary | ICD-10-CM | POA: Diagnosis not present

## 2016-09-13 NOTE — Progress Notes (Addendum)
CARDIOLOGY CONSULT NOTE  Patient ID: BODHI STENGLEIN MRN: [id number] DOB/AGE: 80-Dec-1936 80 y.o.  Admit date: (Not on file) Primary Physician: Deloria Lair., MD Referring Physician: Scotty Court  Reason for Consultation: pedal edema  HPI: Regina Hall is a 82 y.o. female who is being seen today for the evaluation of pedal edema at the request of Deloria Lair., MD.   She has a history of hypertension, back surgery, and knee replacement surgery.  She reportedly has "intractable pedal edema ". I reviewed PCP notes. It has been attributed to chronic venous insufficiency. Lower extremity Dopplers were performed on 08/27/16 and were negative for DVT.  She said she has had bilateral leg swelling ever since her second or third back surgery. She's been evaluated by physical therapists. They recommended she participated in October therapy. She has done so for the last 2 weeks and has lost 3 pounds. She said her leg swelling has markedly decreased and now she can get her feet into her shoes.  She denies chest pain, shortness of breath, orthopnea, and paroxysmal nocturnal dyspnea.  She complains of bilateral shoulder orthopedic issues.  She was prescribed compression stockings but says "they don't work". She has also been previously advised about leg elevation.  Her husband has a h/o MI, whom I evaluated while hospitalized.  She walks with a walker.  Social history: Married. Originally from [place], [place].   Allergies  Allergen Reactions  . Penicillins Rash    Has patient had a PCN reaction causing immediate rash, facial/tongue/throat swelling, SOB or lightheadedness with hypotension: Yes Has patient had a PCN reaction causing severe rash involving mucus membranes or skin necrosis: No Has patient had a PCN reaction that required hospitalization No Has patient had a PCN reaction occurring within the last 10 years: No If all of the above answers are "NO", then  may proceed with Cephalosporin use.     Current Outpatient Prescriptions  Medication Sig Dispense Refill  . Biotin 10 MG TABS Take 10 mg by mouth every morning.     . Carboxymethylcellulose Sodium (THERATEARS OP) Place 2 drops into both eyes as needed (for dry eyes).    . Cholecalciferol (VITAMIN D3) 5000 units TABS Take 5,000 Units by mouth daily.    . Glucosamine Sulfate 1000 MG CAPS Take 2,000 mg by mouth daily.    Marland Kitchen HYDROcodone-acetaminophen (NORCO) 10-325 MG tablet Take 0.5-1 tablets by mouth daily as needed for moderate pain.    Marland Kitchen losartan (COZAAR) 25 MG tablet Take 25 mg by mouth every morning.    . Oxymetazoline HCl (SINUS RELIEF NA) Place 1-2 sprays into both nostrils as needed (for congestion). Reported on 07/06/2015    . triamterene-hydrochlorothiazide (DYAZIDE) 37.5-25 MG per capsule Take 1 capsule by mouth every morning.      No current facility-administered medications for this visit.    Facility-Administered Medications Ordered in Other Visits  Medication Dose Route Frequency Provider Last Rate Last Dose  . ondansetron (ZOFRAN) 4 mg in sodium chloride 0.9 % 50 mL IVPB  4 mg Intravenous Once Rod Can, MD        Past Medical History:  Diagnosis Date  . Anemia   . Anxiety   . Arthritis    KNEES AND BACK  . Bladder incontinence   . Hypertension   . PONV (postoperative nausea and vomiting)    PONV X 1 EPISODE, COMES OUT OF ANESTHESIA FAST, SOME AWARENESS DURING END OF  COLONSCOPY  . Scoliosis     Past Surgical History:  Procedure Laterality Date  . ABDOMINAL HYSTERECTOMY     2006 VAG HYST  . BACK SURGERY     LOWER, X2  . JOINT REPLACEMENT     2011 LT HIP  . TONSILLECTOMY    . TOTAL KNEE ARTHROPLASTY Right 04/27/2014   Procedure: [place] TOTAL KNEE ARTHROPLASTY;  Surgeon: Rod Can, MD;  Location: [facility];  Service: Orthopedics;  Laterality: Right;  . TUBAL LIGATION     1974    Social History   Social History  . Marital status: Married    Spouse  name: N/A  . Number of children: N/A  . Years of education: N/A   Occupational History  . Not on file.   Social History Main Topics  . Smoking status: Never Smoker  . Smokeless tobacco: Never Used  . Alcohol use Yes     Comment: OCC WINE  . Drug use: No  . Sexual activity: Not on file   Other Topics Concern  . Not on file   Social History Narrative  . No narrative on file     No family history of premature CAD in 1st degree relatives.  Current Meds  Medication Sig  . Biotin 10 MG TABS Take 10 mg by mouth every morning.   . Carboxymethylcellulose Sodium (THERATEARS OP) Place 2 drops into both eyes as needed (for dry eyes).  . Cholecalciferol (VITAMIN D3) 5000 units TABS Take 5,000 Units by mouth daily.  . Glucosamine Sulfate 1000 MG CAPS Take 2,000 mg by mouth daily.  Marland Kitchen HYDROcodone-acetaminophen (NORCO) 10-325 MG tablet Take 0.5-1 tablets by mouth daily as needed for moderate pain.  Marland Kitchen losartan (COZAAR) 25 MG tablet Take 25 mg by mouth every morning.  . Oxymetazoline HCl (SINUS RELIEF NA) Place 1-2 sprays into both nostrils as needed (for congestion). Reported on 07/06/2015  . triamterene-hydrochlorothiazide (DYAZIDE) 37.5-25 MG per capsule Take 1 capsule by mouth every morning.       Review of systems complete and found to be negative unless listed above in HPI    Physical exam Blood pressure 113/73, pulse 80, height 5\' 6"  (1.676 m), weight 216 lb (98 kg), SpO2 96 %. General: NAD Neck: No JVD, no thyromegaly or thyroid nodule.  Lungs: Clear to auscultation bilaterally with normal respiratory effort. CV: Nondisplaced PMI. Regular rate and rhythm, normal S1/S2, no S3/S4, no murmur.  Bilateral 1+ pitting leg edema.     Abdomen: Soft, nontender, no distention.  Skin: Intact without lesions or rashes.  Neurologic: Alert and oriented x 3.  Psych: Normal affect. Extremities: No clubbing or cyanosis.  HEENT: Normal.   ECG: Most recent ECG reviewed.   Labs: Lab Results   Component Value Date/Time   K 3.9 04/08/2015 06:22 PM   BUN 30 (H) 04/08/2015 06:22 PM   CREATININE 1.01 (H) 04/08/2015 06:22 PM   ALT 13 (L) 04/08/2015 06:22 PM   HGB 12.1 04/08/2015 06:22 PM     Lipids: No results found for: LDLCALC, LDLDIRECT, CHOL, TRIG, HDL      ASSESSMENT AND PLAN:  1. Bilateral leg edema: This appears to be due to venous insufficiency as JVP is normal and she gives no signs suggestive of CHF. She may have some diastolic dysfunction given her history of hypertension and advanced age. I will obtain an echocardiogram to evaluate for this.  I will also try to obtain a copy of labs from PCP to see if renal function  and liver function are normal. I will try to obtain a copy of an ECG performed at [facility]. However, I have recommended continued participation in aqua therapy with weight loss which will relieve pressure on the lower extremity veins and help augment venous blood flow. It does not appear she will comply with compression stockings. She has already been advised about leg elevation.  2. Hypertension: Controlled. No changes.     Disposition: Follow up in 3 months  Signed: Kate Sable, M.D., F.A.C.C.  09/13/2016, 8:39 AM

## 2016-09-13 NOTE — Telephone Encounter (Signed)
Pre-cert Verification for the following procedure   Echo scheduled for 09-26-16

## 2016-09-13 NOTE — Patient Instructions (Signed)

## 2016-09-26 ENCOUNTER — Other Ambulatory Visit: Payer: Self-pay

## 2016-09-26 ENCOUNTER — Ambulatory Visit (INDEPENDENT_AMBULATORY_CARE_PROVIDER_SITE_OTHER): Payer: Medicare Other

## 2016-09-26 DIAGNOSIS — R600 Localized edema: Secondary | ICD-10-CM

## 2016-10-02 ENCOUNTER — Encounter: Payer: Self-pay | Admitting: *Deleted

## 2016-10-02 ENCOUNTER — Telehealth: Payer: Self-pay | Admitting: Cardiovascular Disease

## 2016-10-02 NOTE — Telephone Encounter (Signed)
Returned someone's call °

## 2016-10-02 NOTE — Telephone Encounter (Signed)
Patient returned call.  Notified of below.   Notes recorded by Laurine Blazer, LPN on 4/68/0321 at 22:48 AM EDT Copy to pmd. ------ Notes recorded by Laurine Blazer, LPN on 2/50/0370 at 48:88 AM EDT Attempted to notify again, no answer. Patient notified of results by letter. ------ Notes recorded by Laurine Blazer, LPN on 09/30/9448 at 3:88 PM EDT Left message to return call. ------ Notes recorded by Herminio Commons, MD on 09/27/2016 at 8:45 AM EDT Normal.

## 2016-10-22 DIAGNOSIS — H10409 Unspecified chronic conjunctivitis, unspecified eye: Secondary | ICD-10-CM | POA: Insufficient documentation

## 2016-12-14 ENCOUNTER — Encounter: Payer: Self-pay | Admitting: Cardiovascular Disease

## 2016-12-14 ENCOUNTER — Ambulatory Visit (INDEPENDENT_AMBULATORY_CARE_PROVIDER_SITE_OTHER): Payer: Medicare Other | Admitting: Cardiovascular Disease

## 2016-12-14 ENCOUNTER — Other Ambulatory Visit: Payer: Self-pay

## 2016-12-14 VITALS — BP 180/70 | HR 99 | Ht 66.0 in | Wt 237.0 lb

## 2016-12-14 DIAGNOSIS — I83893 Varicose veins of bilateral lower extremities with other complications: Secondary | ICD-10-CM | POA: Diagnosis not present

## 2016-12-14 DIAGNOSIS — R600 Localized edema: Secondary | ICD-10-CM

## 2016-12-14 DIAGNOSIS — I10 Essential (primary) hypertension: Secondary | ICD-10-CM

## 2016-12-14 MED ORDER — FUROSEMIDE 40 MG PO TABS: 40.0000 mg | ORAL_TABLET | Freq: Every day | ORAL | 6 refills | Status: DC

## 2016-12-14 MED ORDER — POTASSIUM CHLORIDE CRYS ER 20 MEQ PO TBCR
20.0000 meq | EXTENDED_RELEASE_TABLET | Freq: Every day | ORAL | 6 refills | Status: DC
Start: 2016-12-14 — End: 2016-12-26

## 2016-12-14 NOTE — Patient Instructions (Signed)
Medication Instructions:   Begin Lasix 40mg  daily.  Begin Potassium 74meq daily.  Continue all other medications.    Labwork:  BMET - order given today.   If able to start new medications, do lab on Monday, 12/17/2016.  Office will contact with results via phone or letter.    Testing/Procedures: none  Follow-Up: 6 weeks   Any Other Special Instructions Will Be Listed Below (If Applicable).  If you need a refill on your cardiac medications before your next appointment, please call your pharmacy.

## 2016-12-14 NOTE — Progress Notes (Signed)
SUBJECTIVE: The patient presents for follow-up of bilateral leg edema.  Echocardiogram 09/26/16: Vigorous left ventricular systolic function, normal diastolic function, and normal regional wall motion, LVEF 65-70%.  She took her antihypertensive which includes hydrochlorothiazide just before getting to the office today.  Blood pressure is markedly elevated, 180/70.  She denies chest pain and palpitations.    Social history: Married. Originally from [place], [place].   Review of Systems: As per "subjective", otherwise negative.  Allergies  Allergen Reactions  . Penicillins Rash    Has patient had a PCN reaction causing immediate rash, facial/tongue/throat swelling, SOB or lightheadedness with hypotension: Yes Has patient had a PCN reaction causing severe rash involving mucus membranes or skin necrosis: No Has patient had a PCN reaction that required hospitalization No Has patient had a PCN reaction occurring within the last 10 years: No If all of the above answers are "NO", then may proceed with Cephalosporin use.     Current Outpatient Medications  Medication Sig Dispense Refill  . Biotin 10 MG TABS Take 10 mg by mouth every morning.     . Carboxymethylcellulose Sodium (THERATEARS OP) Place 2 drops into both eyes as needed (for dry eyes).    . Cholecalciferol (VITAMIN D3) 5000 units TABS Take 5,000 Units by mouth daily.    . Glucosamine Sulfate 1000 MG CAPS Take 2,000 mg by mouth daily.    Marland Kitchen HYDROcodone-acetaminophen (NORCO) 10-325 MG tablet Take 0.5-1 tablets by mouth daily as needed for moderate pain.    Marland Kitchen losartan (COZAAR) 25 MG tablet Take 25 mg by mouth every morning.    . Oxymetazoline HCl (SINUS RELIEF NA) Place 1-2 sprays into both nostrils as needed (for congestion). Reported on 07/06/2015    . triamterene-hydrochlorothiazide (DYAZIDE) 37.5-25 MG per capsule Take 1 capsule by mouth every morning.      No current facility-administered medications  for this visit.    Facility-Administered Medications Ordered in Other Visits  Medication Dose Route Frequency Provider Last Rate Last Dose  . ondansetron (ZOFRAN) 4 mg in sodium chloride 0.9 % 50 mL IVPB  4 mg Intravenous Once Rod Can, MD        Past Medical History:  Diagnosis Date  . Anemia   . Anxiety   . Arthritis    KNEES AND BACK  . Bladder incontinence   . Hypertension   . PONV (postoperative nausea and vomiting)    PONV X 1 EPISODE, COMES OUT OF ANESTHESIA FAST, SOME AWARENESS DURING END OF COLONSCOPY  . Scoliosis     Past Surgical History:  Procedure Laterality Date  . ABDOMINAL HYSTERECTOMY     2006 VAG HYST  . BACK SURGERY     LOWER, X2  . JOINT REPLACEMENT     2011 LT HIP  . TONSILLECTOMY    . TOTAL KNEE ARTHROPLASTY Right 04/27/2014   Procedure: [place] TOTAL KNEE ARTHROPLASTY;  Surgeon: Rod Can, MD;  Location: [facility];  Service: Orthopedics;  Laterality: Right;  . TUBAL LIGATION     1974    Social History   Socioeconomic History  . Marital status: Married    Spouse name: Not on file  . Number of children: Not on file  . Years of education: Not on file  . Highest education level: Not on file  Social Needs  . Financial resource strain: Not on file  . Food insecurity - worry: Not on file  . Food insecurity - inability: Not on file  .  Transportation needs - medical: Not on file  . Transportation needs - non-medical: Not on file  Occupational History  . Not on file  Tobacco Use  . Smoking status: Never Smoker  . Smokeless tobacco: Never Used  Substance and Sexual Activity  . Alcohol use: Yes    Comment: OCC WINE  . Drug use: No  . Sexual activity: Not on file  Other Topics Concern  . Not on file  Social History Narrative  . Not on file     Vitals:   12/14/16 1054  BP: (!) 180/70  Pulse: 99  SpO2: 97%  Weight: 237 lb (107.5 kg)  Height: 5\' 6"  (1.676 m)    Wt Readings from Last 3 Encounters:  12/14/16 237 lb (107.5 kg)    09/13/16 216 lb (98 kg)  07/06/15 210 lb (95.3 kg)     PHYSICAL EXAM General: NAD HEENT: Normal. Neck: No JVD, no thyromegaly. Lungs: Clear to auscultation bilaterally with normal respiratory effort. CV: Regular rate and rhythm, normal S1/S2, no S3/S4, no murmur. Bilateral 2-3+ pitting leg edema.   Abdomen: Soft, nontender, no distention.  Neurologic: Alert and oriented.  Psych: Normal affect. Skin: Normal. Musculoskeletal: No gross deformities.    ECG: Most recent ECG reviewed.   Labs: Lab Results  Component Value Date/Time   K 3.9 04/08/2015 06:22 PM   BUN 30 (H) 04/08/2015 06:22 PM   CREATININE 1.01 (H) 04/08/2015 06:22 PM   ALT 13 (L) 04/08/2015 06:22 PM   HGB 12.1 04/08/2015 06:22 PM     Lipids: No results found for: LDLCALC, LDLDIRECT, CHOL, TRIG, HDL     ASSESSMENT AND PLAN: 1. Bilateral leg edema: This appears to be due to venous insufficiency as JVP is normal and she gives no signs suggestive of CHF.  Cardiac function is entirely normal. I will start Lasix 40 mg daily with supplemental potassium chloride 20 mEq daily.  I will obtain a basic metabolic panel within the next few days. I have also recommended continued participation in aqua therapy with weight loss which will relieve pressure on the lower extremity veins and help augment venous blood flow. She tried wearing compression stockings. She has already been advised about leg elevation.  2. Hypertension: Markedly elevated but controlled at last visit.  I will monitor given initiation of diuretic.    Disposition: Follow up 6 weeks   Kate Sable, M.D., F.A.C.C.

## 2016-12-18 ENCOUNTER — Encounter: Payer: Self-pay | Admitting: *Deleted

## 2016-12-20 ENCOUNTER — Telehealth: Payer: Self-pay | Admitting: Cardiovascular Disease

## 2016-12-20 NOTE — Telephone Encounter (Signed)
Patient called stating that she is confused about her recent medications that were called in.

## 2016-12-20 NOTE — Telephone Encounter (Signed)
Patient did not understand the names on the bottles. Advised patient furosemide and lasix are the same. Patient verbalized understanding.

## 2016-12-26 ENCOUNTER — Telehealth: Payer: Self-pay | Admitting: *Deleted

## 2016-12-26 MED ORDER — POTASSIUM CHLORIDE CRYS ER 20 MEQ PO TBCR: 40.0000 meq | EXTENDED_RELEASE_TABLET | Freq: Every day | ORAL | Status: DC

## 2016-12-26 NOTE — Telephone Encounter (Signed)
Notes recorded by Laurine Blazer, LPN on 77/93/9688 at 1:47 PM EST Patient notified. Copy to pmd. Follow up scheduled for 01/25/2017 with Dr. Bronson Ing.   ------  Notes recorded by Herminio Commons, MD on 12/26/2016 at 10:12 AM EST Potassium is low normal. Increase KCl to 40 meq daily when taking Lasix.

## 2017-01-10 ENCOUNTER — Telehealth: Payer: Self-pay | Admitting: Cardiovascular Disease

## 2017-01-10 NOTE — Telephone Encounter (Signed)
Line busy will try again tomorrow

## 2017-01-10 NOTE — Telephone Encounter (Signed)
Patient would like to discuss water pills.  She stated that she is constantly going to the bathroom but does not feel she is losing any fluid.  Wanted to know if she needs to be seen before her appt scheduled for Jan 11,2019

## 2017-01-10 NOTE — Telephone Encounter (Signed)
I will reevaluate at next visit.

## 2017-01-10 NOTE — Telephone Encounter (Signed)
Denies SOB - doesn't know what weight has been running but doesn't think she has gained any has been watching salt intake

## 2017-01-10 NOTE — Telephone Encounter (Signed)
Have weights gone up? Short of breath?

## 2017-01-10 NOTE — Telephone Encounter (Signed)
Pt says she continues to swell in feet/ankles - says has been taking lasix 40 mg daily with potassium - denies SOB/chest pain/dizziness. Has upcoming appt 1/12 with Dr Bronson Ing. Will forward

## 2017-01-11 NOTE — Telephone Encounter (Signed)
Pt aware and will call back if symptoms develop before 1/11 appt

## 2017-01-25 ENCOUNTER — Ambulatory Visit: Payer: Medicare Other | Admitting: Cardiovascular Disease

## 2017-02-28 ENCOUNTER — Telehealth: Payer: Self-pay | Admitting: Cardiovascular Disease

## 2017-02-28 NOTE — Telephone Encounter (Signed)
furosemide (LASIX) 40 MG tablet   Patient stopped taking medication couple of weeks ago due to numerous issues with it

## 2017-02-28 NOTE — Telephone Encounter (Signed)
Not sure if she truly has a sulfa allergy. Try torsemide 20 mg daily. Check BMET 3 days after starting.

## 2017-02-28 NOTE — Telephone Encounter (Signed)
Pt says since 02-16-17 has been experiencing rash on skin - went to pcp who gave her cream - but says its the lasix causing this - pt stopped taking since 02/17/17 and says she is swelling in both legs - doesn't know if she has gained any weight - denies SOB/dzziness/CP - asked if she would be willing to try 1/2 tablet of lasix but pt refused and says she wanted it listed as an allergy and if Dr Bronson Ing could give her another type of diuretic - pt has f/u scheduled for 2/20

## 2017-02-28 NOTE — Telephone Encounter (Signed)
Now pt doesn't want to try another diuretic at this time - wants to discuss with Dr Bronson Ing at 2/20 OV

## 2017-03-06 ENCOUNTER — Ambulatory Visit: Payer: Medicare Other | Admitting: Cardiovascular Disease

## 2017-04-03 ENCOUNTER — Encounter: Payer: Self-pay | Admitting: Cardiovascular Disease

## 2017-04-03 ENCOUNTER — Ambulatory Visit (INDEPENDENT_AMBULATORY_CARE_PROVIDER_SITE_OTHER): Payer: Medicare Other | Admitting: Cardiovascular Disease

## 2017-04-03 VITALS — BP 158/82 | HR 81 | Ht 65.0 in | Wt 237.0 lb

## 2017-04-03 DIAGNOSIS — I10 Essential (primary) hypertension: Secondary | ICD-10-CM | POA: Diagnosis not present

## 2017-04-03 DIAGNOSIS — R600 Localized edema: Secondary | ICD-10-CM

## 2017-04-03 NOTE — Progress Notes (Signed)
SUBJECTIVE: The patient presents for routine follow-up of bilateral leg edema.  She tried Lasix and developed a rash and called our office.  She also called on a different occasion complaining of feet and ankle swelling.  Echocardiogram 09/26/16: Vigorous left ventricular systolic function, normal diastolic function, and normal regional wall motion, LVEF 65-70%.  ECG performed today which I personally reviewed demonstrated sinus rhythm with LVH and late R wave transition.  She told me after taking Lasix she spent most of her time on the toilet urinating but she said her leg swelling never resolved.  She also has a history of urinary incontinence and follows with urology.    Review of Systems: As per "subjective", otherwise negative.  Allergies  Allergen Reactions  . Penicillins Rash    Has patient had a PCN reaction causing immediate rash, facial/tongue/throat swelling, SOB or lightheadedness with hypotension: Yes Has patient had a PCN reaction causing severe rash involving mucus membranes or skin necrosis: No Has patient had a PCN reaction that required hospitalization No Has patient had a PCN reaction occurring within the last 10 years: No If all of the above answers are "NO", then may proceed with Cephalosporin use.     Current Outpatient Medications  Medication Sig Dispense Refill  . Biotin 10 MG TABS Take 10 mg by mouth every morning.     . Carboxymethylcellulose Sodium (THERATEARS OP) Place 2 drops into both eyes as needed (for dry eyes).    . Cholecalciferol (VITAMIN D3) 5000 units TABS Take 5,000 Units by mouth daily.    Marland Kitchen gabapentin (NEURONTIN) 100 MG capsule Take 100 mg by mouth at bedtime.    . Glucosamine Sulfate 1000 MG CAPS Take 2,000 mg by mouth daily.    Marland Kitchen HYDROcodone-acetaminophen (NORCO) 10-325 MG tablet Take 0.5-1 tablets by mouth daily as needed for moderate pain.    Marland Kitchen losartan (COZAAR) 25 MG tablet Take 25 mg by mouth every morning.    . Oxymetazoline HCl  (SINUS RELIEF NA) Place 1-2 sprays into both nostrils as needed (for congestion). Reported on 07/06/2015    . triamterene-hydrochlorothiazide (DYAZIDE) 37.5-25 MG per capsule Take 1 capsule by mouth every morning.      No current facility-administered medications for this visit.    Facility-Administered Medications Ordered in Other Visits  Medication Dose Route Frequency Provider Last Rate Last Dose  . ondansetron (ZOFRAN) 4 mg in sodium chloride 0.9 % 50 mL IVPB  4 mg Intravenous Once Rod Can, MD        Past Medical History:  Diagnosis Date  . Anemia   . Anxiety   . Arthritis    KNEES AND BACK  . Bladder incontinence   . Hypertension   . PONV (postoperative nausea and vomiting)    PONV X 1 EPISODE, COMES OUT OF ANESTHESIA FAST, SOME AWARENESS DURING END OF COLONSCOPY  . Scoliosis     Past Surgical History:  Procedure Laterality Date  . ABDOMINAL HYSTERECTOMY     2006 VAG HYST  . BACK SURGERY     LOWER, X2  . JOINT REPLACEMENT     2011 LT HIP  . TONSILLECTOMY    . TOTAL KNEE ARTHROPLASTY Right 04/27/2014   Procedure: [place] TOTAL KNEE ARTHROPLASTY;  Surgeon: Rod Can, MD;  Location: [facility];  Service: Orthopedics;  Laterality: Right;  . TUBAL LIGATION     1974    Social History   Socioeconomic History  . Marital status: Married    Spouse  name: Not on file  . Number of children: Not on file  . Years of education: Not on file  . Highest education level: Not on file  Social Needs  . Financial resource strain: Not on file  . Food insecurity - worry: Not on file  . Food insecurity - inability: Not on file  . Transportation needs - medical: Not on file  . Transportation needs - non-medical: Not on file  Occupational History  . Not on file  Tobacco Use  . Smoking status: Never Smoker  . Smokeless tobacco: Never Used  Substance and Sexual Activity  . Alcohol use: Yes    Comment: OCC WINE  . Drug use: No  . Sexual activity: Not on file  Other Topics  Concern  . Not on file  Social History Narrative  . Not on file     Vitals:   04/03/17 1335  BP: (!) 158/82  Pulse: 81  SpO2: 98%  Weight: 237 lb (107.5 kg)  Height: 5\' 5"  (1.651 m)    Wt Readings from Last 3 Encounters:  04/03/17 237 lb (107.5 kg)  12/14/16 237 lb (107.5 kg)  09/13/16 216 lb (98 kg)     PHYSICAL EXAM General: NAD HEENT: Normal. Neck: No JVD, no thyromegaly. Lungs: Clear to auscultation bilaterally with normal respiratory effort. CV: Regular rate and rhythm, normal S1/S2, no S3/S4, no murmur. Bilateral 2-3+ pitting legedema/lymphedema.  Abdomen: Soft, nontender, no distention.  Neurologic: Alert and oriented.  Psych: Normal affect. Skin: Normal. Musculoskeletal: No gross deformities.    ECG: Most recent ECG reviewed.   Labs: Lab Results  Component Value Date/Time   K 3.9 04/08/2015 06:22 PM   BUN 30 (H) 04/08/2015 06:22 PM   CREATININE 1.01 (H) 04/08/2015 06:22 PM   ALT 13 (L) 04/08/2015 06:22 PM   HGB 12.1 04/08/2015 06:22 PM     Lipids: No results found for: LDLCALC, LDLDIRECT, CHOL, TRIG, HDL     ASSESSMENT AND PLAN:  1. Bilateral leg edema: This appears to be due to venous insufficiency as JVP is normal and she gives no signs suggestive of CHF.  Cardiac function is entirely normal. She has a history of urinary incontinence and Lasix only caused her to urinate frequently without any reduction in leg swelling. I will make a referral to vascular surgery to see if they have any further recommendations. I have also recommended continued participation in aqua therapy with weight loss which will relieve pressure on the lower extremity veins and helpaugmentvenous blood flow. She tried wearing compression stockings. She has alreadybeen advised about leg elevation.  2. Hypertension:  Blood pressure remains elevated.  She attributes this to diffuse arthritic pain.     Disposition: Follow up with me prn   Kate Sable, M.D.,  F.A.C.C.

## 2017-04-03 NOTE — Progress Notes (Signed)
ekg 

## 2017-04-03 NOTE — Patient Instructions (Signed)
Medication Instructions:  Your physician recommends that you continue on your current medications as directed. Please refer to the Current Medication list given to you today.  Labwork: none  Testing/Procedures: none  Follow-Up: Your physician recommends that you schedule a follow-up appointment as needed   Any Other Special Instructions Will Be Listed Below (If Applicable). You have been referred to VASCULAR   If you need a refill on your cardiac medications before your next appointment, please call your pharmacy.

## 2017-04-15 ENCOUNTER — Telehealth: Payer: Self-pay | Admitting: Cardiovascular Disease

## 2017-04-15 NOTE — Telephone Encounter (Signed)
Patient notified

## 2017-04-15 NOTE — Telephone Encounter (Signed)
Patient called questioning who Dr Raliegh Ip wanted her to see about her foot

## 2017-04-30 ENCOUNTER — Other Ambulatory Visit: Payer: Self-pay

## 2017-04-30 DIAGNOSIS — I872 Venous insufficiency (chronic) (peripheral): Secondary | ICD-10-CM

## 2017-04-30 DIAGNOSIS — R609 Edema, unspecified: Secondary | ICD-10-CM

## 2017-05-01 ENCOUNTER — Other Ambulatory Visit: Payer: Self-pay

## 2017-05-01 ENCOUNTER — Encounter (HOSPITAL_COMMUNITY): Payer: Self-pay | Admitting: *Deleted

## 2017-05-01 NOTE — Progress Notes (Signed)
Spoke with pt for pre-op call. Pt denies cardiac history and chest pain. States she sees Dr. Jacinta Shoe for bilateral lower leg edema.  Pt is concerned about post-op bleeding. She states when she had the 1st surgery on her eyelid that one week after surgery her eyelid started to bleed profusely. She states she saw Dr. [name] that day. She states Dr. [name] didn't understand why she would be bleeding and for her to let us know when she comes in for this surgery. Pt states she had eaten a whole bag of black licorice 2 days prior to the onset of bleeding and she was wondering if that caused it. She states her blood pressure was very high that day.

## 2017-05-02 ENCOUNTER — Ambulatory Visit (HOSPITAL_COMMUNITY): Payer: Medicare Other | Admitting: Anesthesiology

## 2017-05-02 ENCOUNTER — Ambulatory Visit (HOSPITAL_COMMUNITY)
Admission: RE | Admit: 2017-05-02 | Discharge: 2017-05-02 | Disposition: A | Discharge status: Home / Self Care | Payer: Medicare Other | Source: Ambulatory Visit | Attending: Oculoplastics Ophthalmology | Admitting: Oculoplastics Ophthalmology

## 2017-05-02 ENCOUNTER — Encounter (HOSPITAL_COMMUNITY)
Admission: RE | Disposition: A | Discharge status: Home / Self Care | Payer: Self-pay | Source: Ambulatory Visit | Attending: Oculoplastics Ophthalmology

## 2017-05-02 ENCOUNTER — Encounter (HOSPITAL_COMMUNITY): Payer: Self-pay | Admitting: *Deleted

## 2017-05-02 DIAGNOSIS — M1990 Unspecified osteoarthritis, unspecified site: Secondary | ICD-10-CM | POA: Diagnosis not present

## 2017-05-02 DIAGNOSIS — I10 Essential (primary) hypertension: Secondary | ICD-10-CM | POA: Insufficient documentation

## 2017-05-02 DIAGNOSIS — Z6838 Body mass index (BMI) 38.0-38.9, adult: Secondary | ICD-10-CM | POA: Diagnosis not present

## 2017-05-02 DIAGNOSIS — D649 Anemia, unspecified: Secondary | ICD-10-CM | POA: Diagnosis not present

## 2017-05-02 DIAGNOSIS — C441121 Basal cell carcinoma of skin of right upper eyelid, including canthus: Secondary | ICD-10-CM | POA: Insufficient documentation

## 2017-05-02 DIAGNOSIS — F419 Anxiety disorder, unspecified: Secondary | ICD-10-CM | POA: Insufficient documentation

## 2017-05-02 HISTORY — DX: Pneumonia, unspecified organism: J18.9

## 2017-05-02 HISTORY — PX: BIOPSY: SHX5522

## 2017-05-02 HISTORY — PX: RECONSTRUCTION OF EYELID: SHX6576

## 2017-05-02 HISTORY — DX: Localized edema: R60.0

## 2017-05-02 HISTORY — DX: Malignant (primary) neoplasm, unspecified: C80.1

## 2017-05-02 LAB — CBC
HEMATOCRIT: 38.1 % (ref 36.0–46.0)
HEMOGLOBIN: 12.4 g/dL (ref 12.0–15.0)
MCH: 30.8 pg (ref 26.0–34.0)
MCHC: 32.5 g/dL (ref 30.0–36.0)
MCV: 94.5 fL (ref 78.0–100.0)
Platelets: 181 10*3/uL (ref 150–400)
RBC: 4.03 MIL/uL (ref 3.87–5.11)
RDW: 13.2 % (ref 11.5–15.5)
WBC: 7.1 10*3/uL (ref 4.0–10.5)

## 2017-05-02 LAB — BASIC METABOLIC PANEL
ANION GAP: 11 (ref 5–15)
BUN: 28 mg/dL — ABNORMAL HIGH (ref 6–20)
CALCIUM: 9.3 mg/dL (ref 8.9–10.3)
CO2: 22 mmol/L (ref 22–32)
Chloride: 105 mmol/L (ref 101–111)
Creatinine, Ser: 1.28 mg/dL — ABNORMAL HIGH (ref 0.44–1.00)
GFR calc Af Amer: 45 mL/min — ABNORMAL LOW (ref >60)
GFR calc non Af Amer: 38 mL/min — ABNORMAL LOW (ref >60)
GLUCOSE: 108 mg/dL — AB (ref 65–99)
POTASSIUM: 3.5 mmol/L (ref 3.5–5.1)
Sodium: 138 mmol/L (ref 135–145)

## 2017-05-02 LAB — PROTIME-INR
INR: 1.12
Prothrombin Time: 14.3 seconds (ref 11.4–15.2)

## 2017-05-02 SURGERY — RECONSTRUCTION, EYELID
Anesthesia: General | Site: Eye | Laterality: Right

## 2017-05-02 MED ORDER — ROCURONIUM BROMIDE 100 MG/10ML IV SOLN
INTRAVENOUS | Status: DC | PRN
  Administered 2017-05-02: 20 mg via INTRAVENOUS
  Administered 2017-05-02: 60 mg via INTRAVENOUS

## 2017-05-02 MED ORDER — LIDOCAINE-EPINEPHRINE 1 %-1:100000 IJ SOLN
INTRAMUSCULAR | Status: AC
  Filled 2017-05-02: qty 1

## 2017-05-02 MED ORDER — FENTANYL CITRATE (PF) 250 MCG/5ML IJ SOLN
INTRAMUSCULAR | Status: AC
  Filled 2017-05-02: qty 5

## 2017-05-02 MED ORDER — LACTATED RINGERS IV SOLN: INTRAVENOUS | Status: DC

## 2017-05-02 MED ORDER — STERILE WATER FOR IRRIGATION IR SOLN
Status: DC | PRN
  Administered 2017-05-02: 200 mL

## 2017-05-02 MED ORDER — FENTANYL CITRATE (PF) 100 MCG/2ML IJ SOLN
INTRAMUSCULAR | Status: DC | PRN
  Administered 2017-05-02: 50 ug via INTRAVENOUS
  Administered 2017-05-02 (×2): 25 ug via INTRAVENOUS
  Administered 2017-05-02: 50 ug via INTRAVENOUS

## 2017-05-02 MED ORDER — EPHEDRINE SULFATE 50 MG/ML IJ SOLN
INTRAMUSCULAR | Status: DC | PRN
  Administered 2017-05-02: 10 mg via INTRAVENOUS

## 2017-05-02 MED ORDER — PHENYLEPHRINE HCL 10 MG/ML IJ SOLN
INTRAMUSCULAR | Status: DC | PRN
  Administered 2017-05-02: 80 ug via INTRAVENOUS
  Administered 2017-05-02: 40 ug via INTRAVENOUS

## 2017-05-02 MED ORDER — LACTATED RINGERS IV SOLN: INTRAVENOUS | Status: DC | PRN

## 2017-05-02 MED ORDER — SUGAMMADEX SODIUM 200 MG/2ML IV SOLN
INTRAVENOUS | Status: DC | PRN
  Administered 2017-05-02: 250 mg via INTRAVENOUS

## 2017-05-02 MED ORDER — BSS IO SOLN
INTRAOCULAR | Status: AC
  Filled 2017-05-02: qty 15

## 2017-05-02 MED ORDER — HYDROCODONE-ACETAMINOPHEN 5-325 MG PO TABS: 1.0000 | ORAL_TABLET | Freq: Four times a day (QID) | ORAL | 0 refills | Status: AC | PRN

## 2017-05-02 MED ORDER — BUPIVACAINE HCL (PF) 0.75 % IJ SOLN
INTRAMUSCULAR | Status: AC
  Filled 2017-05-02: qty 10

## 2017-05-02 MED ORDER — PROPOFOL 10 MG/ML IV BOLUS
INTRAVENOUS | Status: DC | PRN
  Administered 2017-05-02: 120 mg via INTRAVENOUS
  Administered 2017-05-02: 30 mg via INTRAVENOUS

## 2017-05-02 MED ORDER — ONDANSETRON HCL 4 MG/2ML IJ SOLN
INTRAMUSCULAR | Status: DC | PRN
  Administered 2017-05-02: 4 mg via INTRAVENOUS

## 2017-05-02 MED ORDER — TOBRAMYCIN 0.3 % OP OINT
TOPICAL_OINTMENT | OPHTHALMIC | Status: DC | PRN
  Administered 2017-05-02: 1 via OPHTHALMIC

## 2017-05-02 MED ORDER — LIDOCAINE-EPINEPHRINE 1 %-1:100000 IJ SOLN
INTRAMUSCULAR | Status: DC | PRN
  Administered 2017-05-02: 11:00:00

## 2017-05-02 MED ORDER — ROCURONIUM BROMIDE 10 MG/ML (PF) SYRINGE
PREFILLED_SYRINGE | INTRAVENOUS | Status: AC
  Filled 2017-05-02: qty 5

## 2017-05-02 MED ORDER — 0.9 % SODIUM CHLORIDE (POUR BTL) OPTIME
TOPICAL | Status: DC | PRN
  Administered 2017-05-02: 200 mL

## 2017-05-02 MED ORDER — DEXAMETHASONE SODIUM PHOSPHATE 10 MG/ML IJ SOLN
INTRAMUSCULAR | Status: DC | PRN
  Administered 2017-05-02: 5 mg via INTRAVENOUS

## 2017-05-02 MED ORDER — ONDANSETRON HCL 4 MG/2ML IJ SOLN
INTRAMUSCULAR | Status: AC
  Filled 2017-05-02: qty 2

## 2017-05-02 MED ORDER — SODIUM CHLORIDE 0.9 % IV SOLN
INTRAVENOUS | Status: DC
  Administered 2017-05-02 (×2): via INTRAVENOUS

## 2017-05-02 MED ORDER — TOBRAMYCIN-DEXAMETHASONE 0.3-0.1 % OP OINT
TOPICAL_OINTMENT | OPHTHALMIC | Status: AC
  Filled 2017-05-02: qty 3.5

## 2017-05-02 MED ORDER — TETRACAINE HCL 0.5 % OP SOLN
OPHTHALMIC | Status: AC
  Filled 2017-05-02: qty 4

## 2017-05-02 MED ORDER — DEXAMETHASONE SODIUM PHOSPHATE 10 MG/ML IJ SOLN
INTRAMUSCULAR | Status: AC
  Filled 2017-05-02: qty 1

## 2017-05-02 MED ORDER — BUPIVACAINE HCL (PF) 0.5 % IJ SOLN
INTRAMUSCULAR | Status: AC
  Filled 2017-05-02: qty 10

## 2017-05-02 MED ORDER — PROPOFOL 10 MG/ML IV BOLUS
INTRAVENOUS | Status: AC
  Filled 2017-05-02: qty 20

## 2017-05-02 MED ORDER — NEOMYCIN-POLYMYXIN-DEXAMETH 3.5-10000-0.1 OP SUSP
1.0000 [drp] | OPHTHALMIC | Status: DC
  Filled 2017-05-02: qty 5

## 2017-05-02 SURGICAL SUPPLY — 52 items
APPLICATOR COTTON TIP 6 STRL (MISCELLANEOUS) ×4 IMPLANT
APPLICATOR COTTON TIP 6IN STRL (MISCELLANEOUS) ×8
APPLICATOR DR MATTHEWS STRL (MISCELLANEOUS) ×4 IMPLANT
BANDAGE EYE OVAL (MISCELLANEOUS) ×8 IMPLANT
BLADE SURG 15 STRL LF DISP TIS (BLADE) ×2 IMPLANT
BLADE SURG 15 STRL SS (BLADE) ×2
CANISTER SUCT 3000ML PPV (MISCELLANEOUS) ×4 IMPLANT
CLOSURE STERI-STRIP 1/2X4 (GAUZE/BANDAGES/DRESSINGS) ×2
CLSR STERI-STRIP ANTIMIC 1/2X4 (GAUZE/BANDAGES/DRESSINGS) ×6 IMPLANT
CONT SPEC 4OZ CLIKSEAL STRL BL (MISCELLANEOUS) ×8 IMPLANT
CORD BIPOLAR FORCEPS 12FT (ELECTRODE) ×4 IMPLANT
COVER LIGHT HANDLE STERIS (MISCELLANEOUS) ×4 IMPLANT
COVER SURGICAL LIGHT HANDLE (MISCELLANEOUS) ×4 IMPLANT
DRAPE HALF SHEET 40X57 (DRAPES) ×4 IMPLANT
DRAPE ORTHO SPLIT 87X125 STRL (DRAPES) ×4 IMPLANT
DRESSING TELFA 8X10 (GAUZE/BANDAGES/DRESSINGS) IMPLANT
ELECT NEEDLE BLADE 2-5/6 (NEEDLE) ×4 IMPLANT
ELECT REM PT RETURN 9FT ADLT (ELECTROSURGICAL) ×4
ELECTRODE REM PT RTRN 9FT ADLT (ELECTROSURGICAL) ×2 IMPLANT
FORCEPS BIPOLAR SPETZLER 8 1.0 (NEUROSURGERY SUPPLIES) ×4 IMPLANT
FRAME EYE SHIELD (PROTECTIVE WEAR) ×4 IMPLANT
GAUZE SPONGE 4X4 12PLY STRL (GAUZE/BANDAGES/DRESSINGS) ×4 IMPLANT
GAUZE SPONGE 4X4 16PLY XRAY LF (GAUZE/BANDAGES/DRESSINGS) ×8 IMPLANT
GLOVE BIO SURGEON STRL SZ7.5 (GLOVE) ×4 IMPLANT
GLOVE BIOGEL PI IND STRL 7.5 (GLOVE) ×2 IMPLANT
GLOVE BIOGEL PI IND STRL 8 (GLOVE) ×2 IMPLANT
GLOVE BIOGEL PI INDICATOR 7.5 (GLOVE) ×2
GLOVE BIOGEL PI INDICATOR 8 (GLOVE) ×2
GOWN STRL REUS W/ TWL LRG LVL3 (GOWN DISPOSABLE) ×6 IMPLANT
GOWN STRL REUS W/TWL LRG LVL3 (GOWN DISPOSABLE) ×6
MARKER SKIN DUAL TIP RULER LAB (MISCELLANEOUS) ×4 IMPLANT
NEEDLE PRECISIONGLIDE 27X1.5 (NEEDLE) IMPLANT
NS IRRIG 1000ML POUR BTL (IV SOLUTION) ×4 IMPLANT
PACK CATARACT CUSTOM (CUSTOM PROCEDURE TRAY) ×4 IMPLANT
PAD ARMBOARD 7.5X6 YLW CONV (MISCELLANEOUS) ×8 IMPLANT
PENCIL BUTTON HOLSTER BLD 10FT (ELECTRODE) ×4 IMPLANT
SUCTION FRAZIER TIP 8 FR DISP (SUCTIONS) ×2
SUCTION TUBE FRAZIER 8FR DISP (SUCTIONS) ×2 IMPLANT
SUT CHROMIC 5 0 P 3 (SUTURE) IMPLANT
SUT ETHILON 6 0 P 1 (SUTURE) ×12 IMPLANT
SUT ETHILON 7 0 P 6 18 (SUTURE) IMPLANT
SUT MERSILENE 5 0 RD 1 DA (SUTURE) IMPLANT
SUT PLAIN 5 0 P 3 18 (SUTURE) IMPLANT
SUT PLAIN 6 0 TG1408 (SUTURE) IMPLANT
SUT PLAIN GUT (SUTURE) ×4 IMPLANT
SUT SILK 4 0 P 3 (SUTURE) IMPLANT
SUT VIC AB 5-0 PC1 18 (SUTURE) ×4 IMPLANT
SUT VICRYL 6-0 S14 (SUTURE) IMPLANT
TOWEL NATURAL 6PK STERILE (DISPOSABLE) ×4 IMPLANT
TUBE CONNECTING 12'X1/4 (SUCTIONS) ×1
TUBE CONNECTING 12X1/4 (SUCTIONS) ×3 IMPLANT
WATER STERILE IRR 1000ML POUR (IV SOLUTION) ×4 IMPLANT

## 2017-05-02 NOTE — Discharge Instructions (Addendum)
Place ice on right upper eyelid for 20-30 mins every 2 hours for 2 days after patch is removed.  Leave Steri-strip in place until post-operative appointment. If you have an eye patch. Remove it in 24hrs.    Post Anesthesia Home Care Instructions  Activity: Get plenty of rest for the remainder of the day. A responsible individual must stay with you for 24 hours following the procedure.  For the next 24 hours, DO NOT: -Drive a car -Paediatric nurse -Drink alcoholic beverages -Take any medication unless instructed by your physician -Make any legal decisions or sign important papers.  Meals: Start with liquid foods such as gelatin or soup. Progress to regular foods as tolerated. Avoid greasy, spicy, heavy foods. If nausea and/or vomiting occur, drink only clear liquids until the nausea and/or vomiting subsides. Call your physician if vomiting continues.  Special Instructions/Symptoms: Your throat may feel dry or sore from the anesthesia or the breathing tube placed in your throat during surgery. If this causes discomfort, gargle with warm salt water. The discomfort should disappear within 24 hours.  If you had a scopolamine patch placed behind your ear for the management of post- operative nausea and/or vomiting:  1. The medication in the patch is effective for 72 hours, after which it should be removed.  Wrap patch in a tissue and discard in the trash. Wash hands thoroughly with soap and water. 2. You may remove the patch earlier than 72 hours if you experience unpleasant side effects which may include dry mouth, dizziness or visual disturbances. 3. Avoid touching the patch. Wash your hands with soap and water after contact with the patch.

## 2017-05-02 NOTE — H&P (Signed)
Subjective:    Regina Hall is a 83 y.o. female who presents for evaluation of right basal cell cancer. The pain is described as none. Onset was a few months ago. Symptoms have been unchanged since.   Review of Systems Pertinent items are noted in HPI.    Objective:   There were no vitals taken for this visit.  General:  alert, cooperative and appears stated age Skin:  normal and right upper eyelid s/p incisional biopsy, scab formation Eyes: conjunctivae/corneas clear. PERRL, EOM's intact. Fundi benign. The right upper eyelid area scabbed over s/p incisional biopsy Mouth: MMM no lesions Lymph Nodes:  Cervical, supraclavicular, and axillary nodes normal. Lungs:  clear to auscultation bilaterally Heart:  regular rate and rhythm, S1, S2 normal, no murmur, click, rub or gallop Abdomen: soft, non-tender; bowel sounds normal; no masses,  no organomegaly CVA:  absent Genitourinary: defer exam Extremities:  extremities normal, atraumatic, no cyanosis or edema Neurologic:  negative Psychiatric:  normal mood, behavior, speech, dress, and thought processes    Assessment: Basal Cell Cancer Right Upper Eyelid    Plan: Excisional Biopsy with Total Reconstruction and Full Thickness Graft of the Right Upper Eyelid   1. Discussed the risk of surgery,  and the risks of general anesthetic including MI, CVA, sudden death or even reaction to anesthetic medications. The patient understands the risks, any and all questions were answered to the patient's satisfaction. 2. Follow up: 1 week.  Date of Surgery Update (To be completed by Attending Surgeon day of surgery.)

## 2017-05-02 NOTE — Anesthesia Preprocedure Evaluation (Signed)
Anesthesia Evaluation    Reviewed: Allergy & Precautions, NPO status , Patient's Chart, lab work & pertinent test results  History of Anesthesia Complications Negative for: history of anesthetic complications  Airway Mallampati: I  TM Distance: >3 FB Neck ROM: Full    Dental  (+) Teeth Intact   Pulmonary neg pulmonary ROS,    breath sounds clear to auscultation       Cardiovascular hypertension,  Rhythm:Regular     Neuro/Psych Anxiety negative neurological ROS     GI/Hepatic negative GI ROS, Neg liver ROS,   Endo/Other  Morbid obesity  Renal/GU negative Renal ROS     Musculoskeletal  (+) Arthritis ,   Abdominal   Peds  Hematology  (+) anemia ,   Anesthesia Other Findings   Reproductive/Obstetrics                             Anesthesia Physical Anesthesia Plan  ASA: II  Anesthesia Plan: General   Post-op Pain Management:    Induction: Intravenous  PONV Risk Score and Plan: 4 or greater and 3 and Ondansetron and Dexamethasone  Airway Management Planned: LMA  Additional Equipment: None  Intra-op Plan:   Post-operative Plan: Extubation in OR  Informed Consent: I have reviewed the patients History and Physical, chart, labs and discussed the procedure including the risks, benefits and alternatives for the proposed anesthesia with the patient or authorized representative who has indicated his/her understanding and acceptance.   Dental advisory given  Plan Discussed with: CRNA and Surgeon  Anesthesia Plan Comments:         Anesthesia Quick Evaluation

## 2017-05-02 NOTE — Anesthesia Procedure Notes (Signed)
Procedure Name: Intubation Date/Time: 05/02/2017 10:20 AM Performed by: Scheryl Darter, CRNA Pre-anesthesia Checklist: Patient identified, Emergency Drugs available, Suction available and Patient being monitored Patient Re-evaluated:Patient Re-evaluated prior to induction Oxygen Delivery Method: Circle System Utilized Preoxygenation: Pre-oxygenation with 100% oxygen Induction Type: IV induction Ventilation: Mask ventilation without difficulty Laryngoscope Size: Miller and 2 Grade View: Grade I Tube type: Oral Tube size: 7.5 mm Number of attempts: 1 Airway Equipment and Method: Stylet and Oral airway Placement Confirmation: ETT inserted through vocal cords under direct vision,  positive ETCO2 and breath sounds checked- equal and bilateral Secured at: 22 cm Tube secured with: Tape Dental Injury: Teeth and Oropharynx as per pre-operative assessment

## 2017-05-02 NOTE — Op Note (Signed)
Procedure(s): TOTAL RECONSTRUCTION OF UPPER EYELID RIGHT EYE WITH FULL THICKNESS SKIN GRAFT REMOVAL, BIOPSY OF LEFT EYE LID LESION GREATER THAN 1/4 MARGIN Procedure Note  Regina Hall female 83 y.o. 05/02/2017  Procedure(s) and Anesthesia Type:    * TOTAL RECONSTRUCTION OF UPPER EYELID RIGHT EYE WITH FULL THICKNESS SKIN GRAFT - General    * REMOVAL, BIOPSY OF LEFT EYE LID LESION GREATER THAN 1/4 MARGIN - General  Surgeon(s) and Role:    * Ramiro Pangilinan, Peyton Najjar, MD - Primary      Surgeon: [name]   Assistants: none  Anesthesia: General endotracheal anesthesia  ASA Class: 3  Procedure Detail  TOTAL RECONSTRUCTION OF UPPER EYELID RIGHT EYE WITH FULL THICKNESS SKIN GRAFT, REMOVAL, BIOPSY OF LEFT EYE LID LESION GREATER THAN 1/4 MARGIN  Indications: The patient is aware that they have a basal cell cancer, necessitating excision. The patient is aware of the risks and benefits of surgery including bleeding, infection, scarring, need for re-excision, asymmetry, and loss of the eye, and loss of life. The patient elects to proceed.  The patient was transferred to the OR in supine position where they were prepped and draped in the usual standard sterile fashion for Oculoplastic surgery at [facility]. Attention was turned to the right upper eyelid nasal which was then infiltrated with local anesthesia. The area of visible basal cell was incised with a 15 blade and then excised with a [place] scissors, hemostasis was maintained with monopolar cautery.   Then attention was turned to the right post auricular area. An incision was made to excise the graft. Then hemostasis was maintained with monopolar cautery. Then the deep margin was closed in an interrupted fashion with 5-0 vicryl. Then the skin was closed in a running fashion with a 6-0 nylon.   The graft was placed in the defect and then was closed in a running fashion with a 6-0 fast gut.   Findings: Right upper eyelid basal  cell cancer  Estimated Blood Loss:  less than 50 mL         Drains: none         Total IV Fluids: <2L  Blood Given: none          Specimens: right upper eyelid          Implants: none        Complications:  * No complications entered in OR log *         Disposition: PACU - hemodynamically stable.         Condition: stable

## 2017-05-02 NOTE — Brief Op Note (Signed)
05/02/2017  7:34 AM  PATIENT:  Regina Hall  83 y.o. female  PRE-OPERATIVE DIAGNOSIS:  basal cell carcinoma right eye lid  POST-OPERATIVE DIAGNOSIS:  * No post-op diagnosis entered *  PROCEDURE:  Procedure(s): TOTAL RECONSTRUCTION OF UPPER EYELID RIGHT EYE WITH FULL THICKNESS SKIN GRAFT (Right) REMOVAL, BIOPSY OF LEFT EYE LID LESION GREATER THAN 1/4 MARGIN (Left)  SURGEON:  Surgeon(s) and Role:    * Jasmin Trumbull, Peyton Najjar, MD - Primary  PHYSICIAN ASSISTANT: none  ASSISTANTS: none   ANESTHESIA:   local, general and IV sedation  EBL: minimal   BLOOD ADMINISTERED:none  DRAINS: none   LOCAL MEDICATIONS USED:  BUPIVICAINE  and LIDOCAINE   SPECIMEN:  Source of Specimen:  right upper eyelid   DISPOSITION OF SPECIMEN:  PATHOLOGY  COUNTS:  YES  TOURNIQUET:  * No tourniquets in log *  DICTATION: .Note written in EPIC  PLAN OF CARE: Discharge to home after PACU  PATIENT DISPOSITION:  PACU - hemodynamically stable.   Delay start of Pharmacological VTE agent (>24hrs) due to surgical blood loss or risk of bleeding: no

## 2017-05-02 NOTE — Transfer of Care (Signed)
Immediate Anesthesia Transfer of Care Note  Patient: Regina Hall  Procedure(s) Performed: TOTAL RECONSTRUCTION OF UPPER EYELID RIGHT EYE WITH FULL THICKNESS SKIN GRAFT FROM RIGHT POSTERIOR EAR (Right Eye) BIOPSY OF RIGHT UPPER EYELID LESION (Right Eye)  Patient Location: PACU  Anesthesia Type:General  Level of Consciousness: awake, alert , oriented and sedated  Airway & Oxygen Therapy: Patient Spontanous Breathing and Patient connected to nasal cannula oxygen  Post-op Assessment: Report given to RN, Post -op Vital signs reviewed and stable and Patient moving all extremities  Post vital signs: Reviewed and stable  Last Vitals:  Vitals Value Taken Time  BP 116/48 05/02/2017 12:52 PM  Temp    Pulse 93 05/02/2017 12:54 PM  Resp 19 05/02/2017 12:54 PM  SpO2 96 % 05/02/2017 12:54 PM  Vitals shown include unvalidated device data.  Last Pain:  Vitals:   05/02/17 0738  TempSrc:   PainSc: 7       Patients Stated Pain Goal: 3 (32/54/98 2641)  Complications: No apparent anesthesia complications

## 2017-05-03 NOTE — Anesthesia Postprocedure Evaluation (Signed)
Anesthesia Post Note  Patient: Regina Hall  Procedure(s) Performed: TOTAL RECONSTRUCTION OF UPPER EYELID RIGHT EYE WITH FULL THICKNESS SKIN GRAFT FROM RIGHT POSTERIOR EAR (Right Eye) BIOPSY OF RIGHT UPPER EYELID LESION (Right Eye)     Patient location during evaluation: PACU Anesthesia Type: General Level of consciousness: awake and alert Pain management: pain level controlled Vital Signs Assessment: post-procedure vital signs reviewed and stable Respiratory status: spontaneous breathing, nonlabored ventilation, respiratory function stable and patient connected to nasal cannula oxygen Cardiovascular status: blood pressure returned to baseline and stable Postop Assessment: no apparent nausea or vomiting Anesthetic complications: no    Last Vitals:  Vitals:   05/02/17 1400 05/02/17 1444  BP: 123/64 (!) 154/81  Pulse: 85 87  Resp: 14 18  Temp:    SpO2: 90% 97%    Last Pain:  Vitals:   05/02/17 1444  TempSrc:   PainSc: 0-No pain                 Delshon Blanchfield EDWARD

## 2017-05-07 ENCOUNTER — Encounter (HOSPITAL_COMMUNITY): Payer: Self-pay | Admitting: Oculoplastics Ophthalmology

## 2017-05-29 ENCOUNTER — Encounter: Payer: Self-pay | Admitting: Vascular Surgery

## 2017-05-29 ENCOUNTER — Ambulatory Visit (INDEPENDENT_AMBULATORY_CARE_PROVIDER_SITE_OTHER): Payer: Medicare Other | Admitting: Vascular Surgery

## 2017-05-29 ENCOUNTER — Telehealth: Payer: Self-pay | Admitting: Vascular Surgery

## 2017-05-29 ENCOUNTER — Other Ambulatory Visit: Payer: Self-pay

## 2017-05-29 ENCOUNTER — Ambulatory Visit (HOSPITAL_COMMUNITY)
Admission: RE | Admit: 2017-05-29 | Discharge: 2017-05-29 | Disposition: A | Discharge status: Home / Self Care | Payer: Medicare Other | Source: Ambulatory Visit | Attending: Vascular Surgery | Admitting: Vascular Surgery

## 2017-05-29 VITALS — BP 163/87 | HR 76 | Temp 97.4°F | Resp 20 | Ht 65.0 in | Wt 234.0 lb

## 2017-05-29 DIAGNOSIS — R609 Edema, unspecified: Secondary | ICD-10-CM | POA: Diagnosis present

## 2017-05-29 DIAGNOSIS — I872 Venous insufficiency (chronic) (peripheral): Secondary | ICD-10-CM | POA: Diagnosis present

## 2017-05-29 DIAGNOSIS — I82402 Acute embolism and thrombosis of unspecified deep veins of left lower extremity: Secondary | ICD-10-CM

## 2017-05-29 MED ORDER — RIVAROXABAN (XARELTO) VTE STARTER PACK (15 & 20 MG): ORAL_TABLET | ORAL | 0 refills | Status: DC

## 2017-05-29 MED ORDER — CEPHALEXIN 500 MG PO CAPS: 500.0000 mg | ORAL_CAPSULE | Freq: Three times a day (TID) | ORAL | 3 refills | Status: DC

## 2017-05-29 NOTE — Progress Notes (Signed)
Patient name: Regina Hall MRN: [id number] DOB: 08-Apr-1934 Sex: female  REASON FOR VISIT:   Chronic venous insufficiency.  The consult is requested by Dr. Edson Snowball.   HPI:   Regina Hall is a pleasant 83 y.o. female who I saw back in June 2017 with bilateral lower extremity swelling.  Patient had evidence of chronic venous insufficiency and some evidence of lymphedema.  We discussed conservative measures at that time.  She was to follow-up as needed.  This patient has had a long history of swelling in both lower extremities which began according to the patient in 2017 after she had back surgery.  Her swelling has gotten progressively worse.  She does not remember any acute change in the swelling in her legs recently.  She has undergone a cardiac work-up and was told that it was not from her heart.  She denies any chest pain or shortness of breath.  Her activity is fairly limited.  I do not get any history of claudication or rest pain.  She has had some cellulitis in her legs for many months.  Past Medical History:  Diagnosis Date  . Anemia   . Anxiety    pt denies  . Arthritis    KNEES AND BACK  . Bladder incontinence   . Cancer ([place])    basal cell cancer on nose  . Edema of both feet   . Hypertension   . Pneumonia   . PONV (postoperative nausea and vomiting)    PONV X 1 EPISODE, COMES OUT OF ANESTHESIA FAST, SOME AWARENESS DURING END OF COLONSCOPY  . Scoliosis     Family History  Problem Relation Age of Onset  . Congestive Heart Failure Mother   . Colon cancer Father   . Cancer Brother     SOCIAL HISTORY: Social History   Tobacco Use  . Smoking status: Never Smoker  . Smokeless tobacco: Never Used  Substance Use Topics  . Alcohol use: Yes    Comment: OCC WINE    Allergies  Allergen Reactions  . Penicillins Rash and Other (See Comments)    Has patient had a PCN reaction causing immediate rash, facial/tongue/throat swelling, SOB or lightheadedness with  hypotension: ###Yes## Has patient had a PCN reaction causing severe rash involving mucus membranes or skin necrosis: No Has patient had a PCN reaction that required hospitalization No Has patient had a PCN reaction occurring within the last 10 years: No If all of the above answers are "NO", then may proceed with Cephalosporin use.     Current Outpatient Medications  Medication Sig Dispense Refill  . Biotin 10 MG TABS Take 10 mg by mouth every morning.     . Cholecalciferol (VITAMIN D3) 5000 units TABS Take 5,000 Units by mouth daily.    . Coenzyme Q10 (CO Q 10 PO) Take 1 capsule by mouth daily.    Marland Kitchen gabapentin (NEURONTIN) 100 MG capsule Take 100 mg by mouth at bedtime.    Marland Kitchen GLUCOSAMINE SULFATE PO Take 2 tablets by mouth daily.     . Hypromellose (ARTIFICIAL TEARS OP) Place 2 drops into both eyes daily as needed (for dry eyes).    Marland Kitchen losartan (COZAAR) 25 MG tablet Take 25 mg by mouth every morning.    . Oxymetazoline HCl (SINUS RELIEF NA) Place 2 sprays into both nostrils daily as needed (for congestion).     . triamterene-hydrochlorothiazide (DYAZIDE) 37.5-25 MG per capsule Take 1 capsule by mouth every morning.     Marland Kitchen  HYDROcodone-acetaminophen (NORCO) 10-325 MG tablet Take 0.5-1 tablets by mouth 2 (two) times daily as needed for moderate pain.      No current facility-administered medications for this visit.    Facility-Administered Medications Ordered in Other Visits  Medication Dose Route Frequency Provider Last Rate Last Dose  . ondansetron (ZOFRAN) 4 mg in sodium chloride 0.9 % 50 mL IVPB  4 mg Intravenous Once Rod Can, MD        REVIEW OF SYSTEMS:  [X]  denotes positive finding, [ ]  denotes negative finding Cardiac  Comments:  Chest pain or chest pressure:    Shortness of breath upon exertion:    Short of breath when lying flat:    Irregular heart rhythm:        Vascular    Pain in calf, thigh, or hip brought on by ambulation:    Pain in feet at night that wakes you  up from your sleep:  x   Blood clot in your veins:    Leg swelling:  x       Pulmonary    Oxygen at home:    Productive cough:     Wheezing:         Neurologic    Sudden weakness in arms or legs:     Sudden numbness in arms or legs:     Sudden onset of difficulty speaking or slurred speech:    Temporary loss of vision in one eye:     Problems with dizziness:         Gastrointestinal    Blood in stool:     Vomited blood:         Genitourinary    Burning when urinating:     Blood in urine:        Psychiatric    Major depression:         Hematologic    Bleeding problems:    Problems with blood clotting too easily:        Skin    Rashes or ulcers:        Constitutional    Fever or chills: x    PHYSICAL EXAM:   Vitals:   05/29/17 1421  BP: (!) 163/87  Pulse: 76  Resp: 20  Temp: (!) 97.4 F (36.3 C)  TempSrc: Oral  SpO2: 98%  Weight: 234 lb (106.1 kg)  Height: 5\' 5"  (1.651 m)   GENERAL: The patient is a well-nourished female, in no acute distress. The vital signs are documented above. CARDIAC: There is a regular rate and rhythm.  VASCULAR: I do not detect carotid bruits. She has severe bilateral lower extremity swelling which extends up to her hips. She has swelling on the dorsum of both feet. She has cellulitis in both lower legs and on the dorsum of both feet. PULMONARY: There is good air exchange bilaterally without wheezing or rales. ABDOMEN: Soft and non-tender with normal pitched bowel sounds.  MUSCULOSKELETAL: There are no major deformities or cyanosis. NEUROLOGIC: No focal weakness or paresthesias are detected. SKIN: There are no ulcers or rashes noted. PSYCHIATRIC: The patient has a normal affect.  DATA:    VENOUS DUPLEX: I have independently interpreted her venous duplex scan today.    On the left side, the patient has DVT of the left common femoral vein and femoral vein.  There is partial compression of the common femoral vein with thrombus  visualized.  There is no compression of the proximal to mid femoral vein.  There is  also noted to be venous insufficiency involving the deep system on the left involving the common femoral vein femoral vein and popliteal vein.  There is also superficial venous reflux involving the left great saphenous vein.  MEDICAL ISSUES:   DVT LEFT LOWER EXTREMITY: This patient has a new finding of a DVT involving the left common femoral vein and femoral vein.  This was not seen on her previous duplex when she was seen 2 years ago.  It is difficult to determine the chronicity of this.  Regardless I have recommended that she start Xarelto and I have ordered a follow-up duplex scan in 6 months.  I have sent her prescription for a starter pack for Xarelto to her pharmacist in [place].  We have also discussed the importance of leg elevation.  I will see her back in 3 months for a follow-up duplex scan.  CHRONIC VENOUS INSUFFICIENCY AND LYMPHEDEMA: She does have a long history of combined chronic venous insufficiency and lymphedema.  We have again discussed the importance of intermittent leg elevation the proper positioning for this.  Given her persistent cellulitis I have put her on Keflex.  She will continue this for at least 2 weeks and refill the prescription for cellulitis has not improved.  Deitra Mayo [facility] [phone]

## 2017-05-29 NOTE — Telephone Encounter (Signed)
Pt called stating she could not afford the Xarelto for left common femoral DVT that had been prescribed by Dr Scot Dock earlier today.  I discussed with her that the alternative would be warfarin which takes several days to become therapeutic and that she would require IV heparin in hospital otherwise she is not protected.  I did not think she would be able to afford [place] Lovenox if she could not afford Xarelto.  I offered her admission to [facility] for heparin but she has opted to go to [place] since it is closer to her.  I told her to have [facility] call me if they had any questions  Ruta Hinds, MD [facility] [place] Office: [phone] Pager: [phone]

## 2017-05-29 NOTE — Addendum Note (Signed)
Addended by: Kaleen Mask on: 05/29/2017 04:06 PM   Modules accepted: Orders

## 2017-05-30 ENCOUNTER — Telehealth: Payer: Self-pay | Admitting: Vascular Surgery

## 2017-05-30 NOTE — Telephone Encounter (Signed)
Pt paged me earlier in the evening concerning her DVT. Spoke with Dr in [place] [facility].  He is aware of her left common femoral DVT and will make sure she is treated.  I gave [facility] my pager number if they have further questions.  Ruta Hinds, MD [facility] [place] Office: [phone] Pager: [phone]

## 2017-06-03 ENCOUNTER — Telehealth: Payer: Self-pay | Admitting: Cardiovascular Disease

## 2017-06-03 NOTE — Telephone Encounter (Signed)
Patient notified.  Stated they are planning to move her to [facility].

## 2017-06-03 NOTE — Telephone Encounter (Signed)
She is already seeing Dr. Scot Dock.

## 2017-06-03 NOTE — Telephone Encounter (Signed)
Fwd to provider for further advice.   

## 2017-06-03 NOTE — Telephone Encounter (Signed)
Currently in [facility]. Patient has question about who Dr Bronson Ing would recommend to do procedure for blood clot

## 2017-06-04 ENCOUNTER — Encounter (HOSPITAL_COMMUNITY)
Admission: AD | Disposition: A | Discharge status: Skilled Nursing Facility | Payer: Self-pay | Source: Other Acute Inpatient Hospital | Attending: Family Medicine

## 2017-06-04 ENCOUNTER — Inpatient Hospital Stay (HOSPITAL_COMMUNITY): Payer: Medicare Other | Admitting: Anesthesiology

## 2017-06-04 ENCOUNTER — Encounter (HOSPITAL_COMMUNITY): Payer: Self-pay

## 2017-06-04 ENCOUNTER — Inpatient Hospital Stay (HOSPITAL_COMMUNITY)
Admission: AD | Admit: 2017-06-04 | Discharge: 2017-06-07 | DRG: 253 | Disposition: A | Discharge status: Skilled Nursing Facility | Payer: Medicare Other | Source: Other Acute Inpatient Hospital | Attending: Family Medicine | Admitting: Family Medicine

## 2017-06-04 ENCOUNTER — Inpatient Hospital Stay (HOSPITAL_COMMUNITY): Payer: Medicare Other

## 2017-06-04 DIAGNOSIS — I129 Hypertensive chronic kidney disease with stage 1 through stage 4 chronic kidney disease, or unspecified chronic kidney disease: Secondary | ICD-10-CM | POA: Diagnosis present

## 2017-06-04 DIAGNOSIS — D62 Acute posthemorrhagic anemia: Secondary | ICD-10-CM | POA: Diagnosis present

## 2017-06-04 DIAGNOSIS — S7011XA Contusion of right thigh, initial encounter: Secondary | ICD-10-CM | POA: Diagnosis not present

## 2017-06-04 DIAGNOSIS — I10 Essential (primary) hypertension: Secondary | ICD-10-CM | POA: Diagnosis not present

## 2017-06-04 DIAGNOSIS — I82412 Acute embolism and thrombosis of left femoral vein: Principal | ICD-10-CM | POA: Diagnosis present

## 2017-06-04 DIAGNOSIS — Z85828 Personal history of other malignant neoplasm of skin: Secondary | ICD-10-CM | POA: Diagnosis not present

## 2017-06-04 DIAGNOSIS — Z7901 Long term (current) use of anticoagulants: Secondary | ICD-10-CM

## 2017-06-04 DIAGNOSIS — S8011XA Contusion of right lower leg, initial encounter: Secondary | ICD-10-CM

## 2017-06-04 DIAGNOSIS — T45515A Adverse effect of anticoagulants, initial encounter: Secondary | ICD-10-CM | POA: Diagnosis present

## 2017-06-04 DIAGNOSIS — M7981 Nontraumatic hematoma of soft tissue: Secondary | ICD-10-CM | POA: Diagnosis present

## 2017-06-04 DIAGNOSIS — M7989 Other specified soft tissue disorders: Secondary | ICD-10-CM | POA: Diagnosis not present

## 2017-06-04 DIAGNOSIS — Z96651 Presence of right artificial knee joint: Secondary | ICD-10-CM | POA: Diagnosis present

## 2017-06-04 DIAGNOSIS — I82401 Acute embolism and thrombosis of unspecified deep veins of right lower extremity: Secondary | ICD-10-CM

## 2017-06-04 DIAGNOSIS — I82402 Acute embolism and thrombosis of unspecified deep veins of left lower extremity: Secondary | ICD-10-CM

## 2017-06-04 DIAGNOSIS — Z79899 Other long term (current) drug therapy: Secondary | ICD-10-CM

## 2017-06-04 DIAGNOSIS — N182 Chronic kidney disease, stage 2 (mild): Secondary | ICD-10-CM | POA: Diagnosis present

## 2017-06-04 DIAGNOSIS — I82409 Acute embolism and thrombosis of unspecified deep veins of unspecified lower extremity: Secondary | ICD-10-CM | POA: Diagnosis present

## 2017-06-04 HISTORY — PX: VENA CAVA FILTER PLACEMENT: SHX1085

## 2017-06-04 LAB — CBC
HCT: 25.2 % — ABNORMAL LOW (ref 36.0–46.0)
HEMATOCRIT: 27.7 % — AB (ref 36.0–46.0)
HEMOGLOBIN: 8.2 g/dL — AB (ref 12.0–15.0)
HEMOGLOBIN: 8.9 g/dL — AB (ref 12.0–15.0)
MCH: 30.8 pg (ref 26.0–34.0)
MCH: 30.6 pg (ref 26.0–34.0)
MCHC: 32.5 g/dL (ref 30.0–36.0)
MCHC: 32.1 g/dL (ref 30.0–36.0)
MCV: 94.7 fL (ref 78.0–100.0)
MCV: 95.2 fL (ref 78.0–100.0)
Platelets: 135 10*3/uL — ABNORMAL LOW (ref 150–400)
Platelets: 101 10*3/uL — ABNORMAL LOW (ref 150–400)
RBC: 2.66 MIL/uL — ABNORMAL LOW (ref 3.87–5.11)
RBC: 2.91 MIL/uL — ABNORMAL LOW (ref 3.87–5.11)
RDW: 13.2 % (ref 11.5–15.5)
RDW: 13.2 % (ref 11.5–15.5)
WBC: 7.7 10*3/uL (ref 4.0–10.5)
WBC: 8.3 10*3/uL (ref 4.0–10.5)

## 2017-06-04 LAB — CBC WITH DIFFERENTIAL/PLATELET
Abs Immature Granulocytes: 0 10*3/uL (ref 0.0–0.1)
BASOS ABS: 0 10*3/uL (ref 0.0–0.1)
Basophils Relative: 0 %
EOS PCT: 5 %
Eosinophils Absolute: 0.4 10*3/uL (ref 0.0–0.7)
HCT: 26.6 % — ABNORMAL LOW (ref 36.0–46.0)
HEMOGLOBIN: 8.6 g/dL — AB (ref 12.0–15.0)
Immature Granulocytes: 1 %
LYMPHS PCT: 22 %
Lymphs Abs: 1.7 10*3/uL (ref 0.7–4.0)
MCH: 30.4 pg (ref 26.0–34.0)
MCHC: 32.3 g/dL (ref 30.0–36.0)
MCV: 94 fL (ref 78.0–100.0)
Monocytes Absolute: 1 10*3/uL (ref 0.1–1.0)
Monocytes Relative: 13 %
Neutro Abs: 4.6 10*3/uL (ref 1.7–7.7)
Neutrophils Relative %: 59 %
Platelets: 137 10*3/uL — ABNORMAL LOW (ref 150–400)
RBC: 2.83 MIL/uL — ABNORMAL LOW (ref 3.87–5.11)
RDW: 13.1 % (ref 11.5–15.5)
WBC: 7.8 10*3/uL (ref 4.0–10.5)

## 2017-06-04 LAB — COMPREHENSIVE METABOLIC PANEL
ALBUMIN: 2.8 g/dL — AB (ref 3.5–5.0)
ALK PHOS: 54 U/L (ref 38–126)
ALT: 10 U/L — ABNORMAL LOW (ref 14–54)
ANION GAP: 7 (ref 5–15)
AST: 19 U/L (ref 15–41)
BUN: 18 mg/dL (ref 6–20)
CHLORIDE: 106 mmol/L (ref 101–111)
CO2: 26 mmol/L (ref 22–32)
Calcium: 8.8 mg/dL — ABNORMAL LOW (ref 8.9–10.3)
Creatinine, Ser: 1.09 mg/dL — ABNORMAL HIGH (ref 0.44–1.00)
GFR calc Af Amer: 54 mL/min — ABNORMAL LOW (ref >60)
GFR calc non Af Amer: 47 mL/min — ABNORMAL LOW (ref >60)
GLUCOSE: 120 mg/dL — AB (ref 65–99)
POTASSIUM: 4 mmol/L (ref 3.5–5.1)
Sodium: 139 mmol/L (ref 135–145)
Total Bilirubin: 0.5 mg/dL (ref 0.3–1.2)
Total Protein: 5 g/dL — ABNORMAL LOW (ref 6.5–8.1)

## 2017-06-04 LAB — BASIC METABOLIC PANEL
ANION GAP: 6 (ref 5–15)
BUN: 16 mg/dL (ref 6–20)
CALCIUM: 8.7 mg/dL — AB (ref 8.9–10.3)
CO2: 27 mmol/L (ref 22–32)
Chloride: 107 mmol/L (ref 101–111)
Creatinine, Ser: 1.05 mg/dL — ABNORMAL HIGH (ref 0.44–1.00)
GFR, EST AFRICAN AMERICAN: 57 mL/min — AB (ref >60)
GFR, EST NON AFRICAN AMERICAN: 49 mL/min — AB (ref >60)
Glucose, Bld: 117 mg/dL — ABNORMAL HIGH (ref 65–99)
POTASSIUM: 3.9 mmol/L (ref 3.5–5.1)
SODIUM: 140 mmol/L (ref 135–145)

## 2017-06-04 LAB — TYPE AND SCREEN
ABO/RH(D): B POS
ANTIBODY SCREEN: NEGATIVE

## 2017-06-04 SURGERY — INSERTION, FILTER, VENA CAVA
Anesthesia: General

## 2017-06-04 MED ORDER — ROCURONIUM BROMIDE 10 MG/ML (PF) SYRINGE
PREFILLED_SYRINGE | INTRAVENOUS | Status: DC | PRN
  Administered 2017-06-04: 30 mg via INTRAVENOUS

## 2017-06-04 MED ORDER — ONDANSETRON HCL 4 MG/2ML IJ SOLN: 4.0000 mg | Freq: Four times a day (QID) | INTRAMUSCULAR | Status: DC | PRN

## 2017-06-04 MED ORDER — ONDANSETRON HCL 4 MG/2ML IJ SOLN
INTRAMUSCULAR | Status: DC | PRN
  Administered 2017-06-04: 4 mg via INTRAVENOUS

## 2017-06-04 MED ORDER — FENTANYL CITRATE (PF) 250 MCG/5ML IJ SOLN
INTRAMUSCULAR | Status: DC | PRN
  Administered 2017-06-04: 50 ug via INTRAVENOUS
  Administered 2017-06-04: 100 ug via INTRAVENOUS

## 2017-06-04 MED ORDER — DEXAMETHASONE SODIUM PHOSPHATE 10 MG/ML IJ SOLN
INTRAMUSCULAR | Status: AC
  Filled 2017-06-04: qty 2

## 2017-06-04 MED ORDER — SUGAMMADEX SODIUM 500 MG/5ML IV SOLN
INTRAVENOUS | Status: AC
  Filled 2017-06-04: qty 5

## 2017-06-04 MED ORDER — LIDOCAINE HCL (PF) 1 % IJ SOLN
INTRAMUSCULAR | Status: AC
  Filled 2017-06-04: qty 30

## 2017-06-04 MED ORDER — GABAPENTIN 100 MG PO CAPS
100.0000 mg | ORAL_CAPSULE | Freq: Every day | ORAL | Status: DC
  Administered 2017-06-04 – 2017-06-06 (×3): 100 mg via ORAL
  Filled 2017-06-04 (×3): qty 1

## 2017-06-04 MED ORDER — CEFAZOLIN SODIUM-DEXTROSE 2-3 GM-%(50ML) IV SOLR
INTRAVENOUS | Status: DC | PRN
  Administered 2017-06-04: 2 g via INTRAVENOUS

## 2017-06-04 MED ORDER — PHENYLEPHRINE 40 MCG/ML (10ML) SYRINGE FOR IV PUSH (FOR BLOOD PRESSURE SUPPORT)
PREFILLED_SYRINGE | INTRAVENOUS | Status: AC
  Filled 2017-06-04: qty 20

## 2017-06-04 MED ORDER — ONDANSETRON HCL 4 MG/2ML IJ SOLN
INTRAMUSCULAR | Status: AC
  Filled 2017-06-04: qty 2

## 2017-06-04 MED ORDER — ROCURONIUM BROMIDE 10 MG/ML (PF) SYRINGE
PREFILLED_SYRINGE | INTRAVENOUS | Status: AC
  Filled 2017-06-04: qty 5

## 2017-06-04 MED ORDER — SODIUM CHLORIDE 0.9 % IV SOLN
INTRAVENOUS | Status: AC
Start: 2017-06-04 — End: 2017-06-05
  Administered 2017-06-04: 02:00:00 via INTRAVENOUS

## 2017-06-04 MED ORDER — LACTATED RINGERS IV SOLN
INTRAVENOUS | Status: DC | PRN
  Administered 2017-06-04 (×2): via INTRAVENOUS

## 2017-06-04 MED ORDER — DEXAMETHASONE SODIUM PHOSPHATE 10 MG/ML IJ SOLN
INTRAMUSCULAR | Status: DC | PRN
  Administered 2017-06-04: 10 mg via INTRAVENOUS

## 2017-06-04 MED ORDER — SUCCINYLCHOLINE CHLORIDE 20 MG/ML IJ SOLN
INTRAMUSCULAR | Status: DC | PRN
  Administered 2017-06-04: 100 mg via INTRAVENOUS

## 2017-06-04 MED ORDER — MIDAZOLAM HCL 2 MG/2ML IJ SOLN
INTRAMUSCULAR | Status: AC
  Filled 2017-06-04: qty 2

## 2017-06-04 MED ORDER — LIDOCAINE 2% (20 MG/ML) 5 ML SYRINGE
INTRAMUSCULAR | Status: DC | PRN
  Administered 2017-06-04: 40 mg via INTRAVENOUS

## 2017-06-04 MED ORDER — FENTANYL CITRATE (PF) 250 MCG/5ML IJ SOLN
INTRAMUSCULAR | Status: AC
  Filled 2017-06-04: qty 5

## 2017-06-04 MED ORDER — SUCCINYLCHOLINE CHLORIDE 200 MG/10ML IV SOSY
PREFILLED_SYRINGE | INTRAVENOUS | Status: AC
  Filled 2017-06-04: qty 10

## 2017-06-04 MED ORDER — SODIUM CHLORIDE 0.9 % IV SOLN
INTRAVENOUS | Status: AC
  Filled 2017-06-04: qty 1.2

## 2017-06-04 MED ORDER — LIDOCAINE 2% (20 MG/ML) 5 ML SYRINGE
INTRAMUSCULAR | Status: AC
  Filled 2017-06-04: qty 5

## 2017-06-04 MED ORDER — HYDRALAZINE HCL 20 MG/ML IJ SOLN: 10.0000 mg | INTRAMUSCULAR | Status: DC | PRN

## 2017-06-04 MED ORDER — IODIXANOL 320 MG/ML IV SOLN
INTRAVENOUS | Status: DC | PRN
  Administered 2017-06-04: 35 mL via INTRAVENOUS

## 2017-06-04 MED ORDER — MORPHINE SULFATE (PF) 2 MG/ML IV SOLN
2.0000 mg | INTRAVENOUS | Status: DC | PRN
  Administered 2017-06-04 – 2017-06-07 (×4): 2 mg via INTRAVENOUS
  Filled 2017-06-04 (×4): qty 1

## 2017-06-04 MED ORDER — ONDANSETRON HCL 4 MG PO TABS: 4.0000 mg | ORAL_TABLET | Freq: Four times a day (QID) | ORAL | Status: DC | PRN

## 2017-06-04 MED ORDER — SODIUM CHLORIDE 0.9 % IV SOLN
INTRAVENOUS | Status: DC | PRN
  Administered 2017-06-04: 500 mL

## 2017-06-04 MED ORDER — FENTANYL CITRATE (PF) 100 MCG/2ML IJ SOLN: 25.0000 ug | INTRAMUSCULAR | Status: DC | PRN

## 2017-06-04 MED ORDER — PROPOFOL 10 MG/ML IV BOLUS
INTRAVENOUS | Status: DC | PRN
  Administered 2017-06-04: 30 mg via INTRAVENOUS
  Administered 2017-06-04: 100 mg via INTRAVENOUS

## 2017-06-04 MED ORDER — EPHEDRINE SULFATE 50 MG/ML IJ SOLN
INTRAMUSCULAR | Status: DC | PRN
  Administered 2017-06-04: 10 mg via INTRAVENOUS
  Administered 2017-06-04: 5 mg via INTRAVENOUS

## 2017-06-04 MED ORDER — ACETAMINOPHEN 325 MG PO TABS
650.0000 mg | ORAL_TABLET | Freq: Four times a day (QID) | ORAL | Status: DC | PRN
  Administered 2017-06-06: 650 mg via ORAL
  Filled 2017-06-04: qty 2

## 2017-06-04 MED ORDER — ACETAMINOPHEN 650 MG RE SUPP: 650.0000 mg | Freq: Four times a day (QID) | RECTAL | Status: DC | PRN

## 2017-06-04 MED ORDER — PHENYLEPHRINE 40 MCG/ML (10ML) SYRINGE FOR IV PUSH (FOR BLOOD PRESSURE SUPPORT)
PREFILLED_SYRINGE | INTRAVENOUS | Status: DC | PRN
  Administered 2017-06-04 (×3): 120 ug via INTRAVENOUS
  Administered 2017-06-04: 160 ug via INTRAVENOUS
  Administered 2017-06-04: 80 ug via INTRAVENOUS

## 2017-06-04 MED ORDER — SUGAMMADEX SODIUM 200 MG/2ML IV SOLN
INTRAVENOUS | Status: DC | PRN
  Administered 2017-06-04: 150 mg via INTRAVENOUS

## 2017-06-04 SURGICAL SUPPLY — 39 items
BAG BANDED W/RUBBER/TAPE 36X54 (MISCELLANEOUS) ×3 IMPLANT
BAG DECANTER FOR FLEXI CONT (MISCELLANEOUS) ×3 IMPLANT
BAG SNAP BAND KOVER 36X36 (MISCELLANEOUS) ×3 IMPLANT
BLADE SURG 11 STRL SS (BLADE) ×3 IMPLANT
COVER BACK TABLE 60X90IN (DRAPES) ×3 IMPLANT
COVER PROBE W GEL 5X96 (DRAPES) ×6 IMPLANT
DECANTER SPIKE VIAL GLASS SM (MISCELLANEOUS) ×3 IMPLANT
DERMABOND ADVANCED (GAUZE/BANDAGES/DRESSINGS) ×2
DERMABOND ADVANCED .7 DNX12 (GAUZE/BANDAGES/DRESSINGS) ×1 IMPLANT
DRAPE FEMORAL ANGIO 80X135IN (DRAPES) ×3 IMPLANT
DRAPE LAPAROTOMY T 102X78X121 (DRAPES) ×3 IMPLANT
DRSG TEGADERM 4X4.75 (GAUZE/BANDAGES/DRESSINGS) ×3 IMPLANT
FILTER VC CELECT-FEMORAL (Filter) ×3 IMPLANT
GAUZE SPONGE 4X4 12PLY STRL (GAUZE/BANDAGES/DRESSINGS) ×3 IMPLANT
GAUZE SPONGE 4X4 16PLY XRAY LF (GAUZE/BANDAGES/DRESSINGS) ×6 IMPLANT
GLOVE BIO SURGEON STRL SZ7.5 (GLOVE) ×6 IMPLANT
GLOVE BIOGEL PI IND STRL 8 (GLOVE) ×2 IMPLANT
GLOVE BIOGEL PI INDICATOR 8 (GLOVE) ×4
GOWN STRL REUS W/ TWL LRG LVL3 (GOWN DISPOSABLE) ×2 IMPLANT
GOWN STRL REUS W/TWL LRG LVL3 (GOWN DISPOSABLE) ×4
KIT BASIN OR (CUSTOM PROCEDURE TRAY) ×3 IMPLANT
KIT TURNOVER KIT B (KITS) ×3 IMPLANT
NEEDLE HYPO 25GX1X1/2 BEV (NEEDLE) ×3 IMPLANT
NEEDLE PERC 18GX7CM (NEEDLE) ×6 IMPLANT
NS IRRIG 1000ML POUR BTL (IV SOLUTION) ×3 IMPLANT
PACK SURGICAL SETUP 50X90 (CUSTOM PROCEDURE TRAY) ×6 IMPLANT
PAD ARMBOARD 7.5X6 YLW CONV (MISCELLANEOUS) ×6 IMPLANT
SHEATH AVANTI 11CM 5FR (SHEATH) ×3 IMPLANT
STOPCOCK MORSE 400PSI 3WAY (MISCELLANEOUS) ×3 IMPLANT
SUT VICRYL 4-0 PS2 18IN ABS (SUTURE) ×3 IMPLANT
SYR 10ML LL (SYRINGE) ×6 IMPLANT
SYR 30ML LL (SYRINGE) ×3 IMPLANT
SYR 5ML LL (SYRINGE) ×3 IMPLANT
SYRINGE 20CC LL (MISCELLANEOUS) ×3 IMPLANT
TOWEL GREEN STERILE (TOWEL DISPOSABLE) ×3 IMPLANT
TUBING HIGH PRESSURE 120CM (CONNECTOR) ×3 IMPLANT
WATER STERILE IRR 1000ML POUR (IV SOLUTION) ×3 IMPLANT
WIRE BENTSON .035X145CM (WIRE) ×3 IMPLANT
WIRE J 3MM .035X145CM (WIRE) ×3 IMPLANT

## 2017-06-04 NOTE — Anesthesia Procedure Notes (Signed)
Procedure Name: Intubation Date/Time: 06/04/2017 4:07 PM Performed by: Bryson Corona, CRNA Pre-anesthesia Checklist: Patient identified, Emergency Drugs available, Suction available and Patient being monitored Patient Re-evaluated:Patient Re-evaluated prior to induction Oxygen Delivery Method: Circle System Utilized Preoxygenation: Pre-oxygenation with 100% oxygen Induction Type: IV induction Ventilation: Mask ventilation without difficulty Laryngoscope Size: Mac and 3 Grade View: Grade I Tube type: Oral Tube size: 7.0 mm Number of attempts: 1 Airway Equipment and Method: Stylet Placement Confirmation: ETT inserted through vocal cords under direct vision,  positive ETCO2 and breath sounds checked- equal and bilateral Secured at: 22 cm Tube secured with: Tape Dental Injury: Teeth and Oropharynx as per pre-operative assessment

## 2017-06-04 NOTE — Progress Notes (Addendum)
Patient seen and examined  Currently patient is pain-free resting comfortably in bed,  Discussed plan of care with her Plan is  #1 DVT noted in the left common femoral vein/Thigh hematoma after starting anticoagulation  vascular surgery consulted  S/p  IVC filter placement,situation is complicated   By fact that  patient may not be a good candidate for long-term anticoagulation given that she had a spontaneous hematoma in her right thigh fairly quickly after being started on Coumadin. Follow  back in 6 weeks with vascular  to discuss those options.  If she can safely be restarted on anticoagulation once she is at lower risk for bleeding complications and certainly we could consider filter removal     #2 acute blood loss anemia hemoglobin was around 12, now 8, stable since yesterday, if it trends down then she will need transfusion, follow CBC closely

## 2017-06-04 NOTE — Op Note (Signed)
    NAME: Regina Hall   MRN: [id number] DOB: September 15, 1934    DATE OF OPERATION: 06/04/2017  PREOP DIAGNOSIS:    DVT left lower extremity with contraindication to anticoagulation  POSTOP DIAGNOSIS:    Same  PROCEDURE:    1.  Ultrasound-guided access to the right femoral vein 2.  Inferior venacavogram 3.  Placement of inferior vena cava filter (Cook Celect IVC Filter)  SURGEON: Judeth Cornfield. Scot Dock, MD, FACS  ASSIST: None  ANESTHESIA: General  EBL: Minimal  INDICATIONS:    Regina Hall is a 82 y.o. female who presented with a left lower extremity DVT.  She was at a referring hospital and started on Lovenox and Coumadin.  She developed significant pain in the right thigh and by CT was found to have a large hematoma.  Given the contraindication to heparin we were consulted to consider IVC filter placement.  FINDINGS:   The filter was in excellent position below the level of the renal veins.  TECHNIQUE:   The patient was brought to the operating room and received a general anesthetic.  The right groin was prepped and draped in usual sterile fashion.  Under ultrasound guidance, the the right common femoral vein was cannulated just above the saphenofemoral junction.  A guidewire was introduced into the inferior vena cava under fluoroscopic control.  The tract over the wire was dilated and then the coaxial introducer sheath advanced over the wire into the inferior vena cava.  The wire was then removed and an inferior venacavogram was obtained to demonstrate the position of the renal veins.  These were then marked on the screen.  The wire and dilator were then removed.  I then advanced the preloaded filter introducer through the sheath up to the tactile bump.  The entire system was advanced so that the top of the filter was at the level of the renal veins.  The sheath was then retracted thus exposing the filter.  The filter was then deployed without difficulty.  Completion film showed  excellent position of the filter.  The sheath was removed and pressure held for hemostasis.  No immediate complications were noted.  FOLLOW-UP PLAN: The situation is complicated in that the patient may not be a good candidate for long-term anticoagulation given that she had a spontaneous hematoma in her right thigh fairly quickly after being started on Coumadin.  This reason it may be best to leave the filter although I will see her back in 6 weeks to discuss those options.  If she can safely be restarted on anticoagulation once she is at lower risk for bleeding complications and certainly we could consider filter removal.  Deitra Mayo, MD, FACS [facility]  DATE OF DICTATION:   06/04/2017

## 2017-06-04 NOTE — H&P (Signed)
History and Physical    Regina Hall OVF:[id number] DOB: July 05, 1934 DOA: 06/04/2017  PCP: Sandi Mealy, MD  Patient coming from: Patient transferred from [facility].  Chief Complaint: Right thigh hematoma.  HPI: Regina Hall is a 83 y.o. female with history of hypertension, chronic kidney disease stage II who was recently diagnosed with left lower extremity DVT and was planned to have outpatient oral anticoagulant was referred to the hospital since patient was not able to obtain it.  Patient was admitted to [facility] and was started on heparin and Coumadin bridging.  Patient subsequently started developing pain in the right thigh and a CAT scan done showed 8 x 7 x 5 cm hematoma of the right thigh.  Patient may require IVC filter and transferred to [facility].  On my exam patient states her right thigh pain is improved with morphine injection.  There is a large ecchymotic area on the right thigh.  Patient is able to move her knee and hip area.  Denies any chest pain or shortness of breath.  ED Course: Patient was a direct admit.  Review of Systems: As per HPI, rest all negative.   Past Medical History:  Diagnosis Date  . Anemia   . Anxiety    pt denies  . Arthritis    KNEES AND BACK  . Bladder incontinence   . Cancer ([place])    basal cell cancer on nose  . Edema of both feet   . Hypertension   . Pneumonia   . PONV (postoperative nausea and vomiting)    PONV X 1 EPISODE, COMES OUT OF ANESTHESIA FAST, SOME AWARENESS DURING END OF COLONSCOPY  . Scoliosis     Past Surgical History:  Procedure Laterality Date  . ABDOMINAL HYSTERECTOMY     2006 VAG HYST  . BACK SURGERY     LOWER, X2  . BIOPSY Right 05/02/2017   Procedure: BIOPSY OF RIGHT UPPER EYELID LESION;  Surgeon: [name], MD;  Location: [facility];  Service: Ophthalmology;  Laterality: Right;  . EYE SURGERY Bilateral    cataract surgery with lens implant  . JOINT REPLACEMENT     2011 LT HIP  .  RECONSTRUCTION OF EYELID Right 05/02/2017   Procedure: TOTAL RECONSTRUCTION OF UPPER EYELID RIGHT EYE WITH FULL THICKNESS SKIN GRAFT FROM RIGHT POSTERIOR EAR;  Surgeon: [name], MD;  Location: [facility];  Service: Ophthalmology;  Laterality: Right;  . TONSILLECTOMY    . TOTAL KNEE ARTHROPLASTY Right 04/27/2014   Procedure: [place] TOTAL KNEE ARTHROPLASTY;  Surgeon: Rod Can, MD;  Location: [facility];  Service: Orthopedics;  Laterality: Right;  . TUBAL LIGATION     1974     reports that she has never smoked. She has never used smokeless tobacco. She reports that she drinks alcohol. She reports that she does not use drugs.  Allergies  Allergen Reactions  . Penicillins Rash and Other (See Comments)    Has patient had a PCN reaction causing immediate rash, facial/tongue/throat swelling, SOB or lightheadedness with hypotension: ###Yes## Has patient had a PCN reaction causing severe rash involving mucus membranes or skin necrosis: No Has patient had a PCN reaction that required hospitalization No Has patient had a PCN reaction occurring within the last 10 years: No If all of the above answers are "NO", then may proceed with Cephalosporin use.     Family History  Problem Relation Age of Onset  . Congestive Heart Failure Mother   .  Colon cancer Father   . Cancer Brother     Prior to Admission medications   Medication Sig Start Date End Date Taking? Authorizing Provider  Biotin 10 MG TABS Take 10 mg by mouth every morning.     [provider]  cephALEXin (KEFLEX) 500 MG capsule Take 1 capsule (500 mg total) by mouth 3 (three) times daily. 05/29/17   Angelia Mould, MD  Cholecalciferol (VITAMIN D3) 5000 units TABS Take 5,000 Units by mouth daily.    [provider]  Coenzyme Q10 (CO Q 10 PO) Take 1 capsule by mouth daily.    [provider]  gabapentin (NEURONTIN) 100 MG capsule Take 100 mg by mouth at bedtime.    [provider]  GLUCOSAMINE  SULFATE PO Take 2 tablets by mouth daily.     [provider]  HYDROcodone-acetaminophen (NORCO) 10-325 MG tablet Take 0.5-1 tablets by mouth 2 (two) times daily as needed for moderate pain.     [provider]  Hypromellose (ARTIFICIAL TEARS OP) Place 2 drops into both eyes daily as needed (for dry eyes).    [provider]  losartan (COZAAR) 25 MG tablet Take 25 mg by mouth every morning.    [provider]  Oxymetazoline HCl (SINUS RELIEF NA) Place 2 sprays into both nostrils daily as needed (for congestion).     [provider]  Rivaroxaban 15 & 20 MG TBPK Take as directed on package: Start with one 15mg  tablet by mouth twice a day with food. On Day 22, switch to one 20mg  tablet once a day with food. 05/29/17   Angelia Mould, MD  triamterene-hydrochlorothiazide (DYAZIDE) 37.5-25 MG per capsule Take 1 capsule by mouth every morning.     [provider]    Physical Exam: Vitals:   06/04/17 0104  BP: (!) 160/73  Pulse: 98  Resp: 16  Temp: 98.9 F (37.2 C)  TempSrc: Oral  SpO2: 96%  Weight: 104.2 kg (229 lb 11.5 oz)  Height: 5\' 5"  (1.651 m)      Constitutional: Moderately built and nourished. Vitals:   06/04/17 0104  BP: (!) 160/73  Pulse: 98  Resp: 16  Temp: 98.9 F (37.2 C)  TempSrc: Oral  SpO2: 96%  Weight: 104.2 kg (229 lb 11.5 oz)  Height: 5\' 5"  (1.651 m)   Eyes: Anicteric no pallor. ENMT: No discharge from the ears eyes nose or mouth. Neck: No mass felt.  No neck rigidity.  No JVD appreciated. Respiratory: No rhonchi or crepitations. Cardiovascular: S1-S2 heard no murmurs appreciated. Abdomen: Soft nontender bowel sounds present. Musculoskeletal: Right thigh is swollen with ecchymotic area.  Patient is able to move her right hip and knee. Skin: Ecchymotic area in the right thigh. Neurologic: Alert awake oriented to time place and person.  Moves all extremities. Psychiatric: Appears normal.   Labs on  Admission: I have personally reviewed following labs and imaging studies  CBC: Recent Labs  Lab 06/04/17 0151  WBC 7.8  NEUTROABS 4.6  HGB 8.6*  HCT 26.6*  MCV 94.0  PLT 244*   Basic Metabolic Panel: Recent Labs  Lab 06/04/17 0151  NA 139  K 4.0  CL 106  CO2 26  GLUCOSE 120*  BUN 18  CREATININE 1.09*  CALCIUM 8.8*   GFR: Estimated Creatinine Clearance: 49.3 mL/min (A) (by C-G formula based on SCr of 1.09 mg/dL (H)). Liver Function Tests: Recent Labs  Lab 06/04/17 0151  AST 19  ALT 10*  ALKPHOS  54  BILITOT 0.5  PROT 5.0*  ALBUMIN 2.8*   No results for input(s): LIPASE, AMYLASE in the last 168 hours. No results for input(s): AMMONIA in the last 168 hours. Coagulation Profile: No results for input(s): INR, PROTIME in the last 168 hours. Cardiac Enzymes: No results for input(s): CKTOTAL, CKMB, CKMBINDEX, TROPONINI in the last 168 hours. BNP (last 3 results) No results for input(s): PROBNP in the last 8760 hours. HbA1C: No results for input(s): HGBA1C in the last 72 hours. CBG: No results for input(s): GLUCAP in the last 168 hours. Lipid Profile: No results for input(s): CHOL, HDL, LDLCALC, TRIG, CHOLHDL, LDLDIRECT in the last 72 hours. Thyroid Function Tests: No results for input(s): TSH, T4TOTAL, FREET4, T3FREE, THYROIDAB in the last 72 hours. Anemia Panel: No results for input(s): VITAMINB12, FOLATE, FERRITIN, TIBC, IRON, RETICCTPCT in the last 72 hours. Urine analysis:    Component Value Date/Time   COLORURINE YELLOW 04/08/2015 1820   APPEARANCEUR CLEAR 04/08/2015 1820   LABSPEC 1.010 04/08/2015 1820   PHURINE 7.5 04/08/2015 1820   GLUCOSEU NEGATIVE 04/08/2015 1820   HGBUR NEGATIVE 04/08/2015 1820   BILIRUBINUR NEGATIVE 04/08/2015 1820   KETONESUR NEGATIVE 04/08/2015 1820   PROTEINUR NEGATIVE 04/08/2015 1820   UROBILINOGEN 0.2 04/21/2014 1200   NITRITE NEGATIVE 04/08/2015 1820   LEUKOCYTESUR NEGATIVE 04/08/2015 1820   Sepsis  Labs: @LABRCNTIP (procalcitonin:4,lacticidven:4) )No results found for this or any previous visit (from the past 240 hour(s)).   Radiological Exams on Admission: No results found.    Assessment/Plan Principal Problem:   Hematoma of right thigh Active Problems:   DVT (deep venous thrombosis) (HCC)   CKD (chronic kidney disease) stage 2, GFR 60-89 ml/min   Essential hypertension   Acute blood loss anemia    1. Spontaneous hematoma of the right thigh after starting anticoagulation -anticoagulants on hold at this time.  Closely observe for any further worsening of the right thigh circumference or any development of compartment syndrome. 2. Recently diagnosed left lower extremity DVT -due to right thigh hematoma anticoagulation stopped.  Please discuss with vascular surgeon Dr. Scot Dock for possible need of IVC filter. 3. Hypertension -since patient is kept n.p.o. in anticipation of procedure will keep patient on PRN IV hydralazine. 4. Chronic kidney disease stage II -creatinine appears to be at baseline. 5. Anemia from acute blood loss.  Hemoglobin is around 12 recently is dropped to 8.  Closely follow CBC.  Type and screen.  DVT prophylaxis: No DVT prophylaxis since patient has DVT and also hematoma. Code Status: Full code. Family Communication: Discussed with patient. Disposition Plan: To be determined. Consults called: None. Admission status: Inpatient.   Rise Patience MD [facility] Pager [phone].  If 7PM-7AM, please contact night-coverage [email or web address] Password [facility]  06/04/2017, 4:41 AM

## 2017-06-04 NOTE — Progress Notes (Signed)
Preliminary results by tech - Bilateral lower venous duplex completed. Right leg, negative for DVT. Left leg, positive for a partial DVT in the common femoral vein and femoral vein. No evidence of DVT in the bilateral iliac veins. Oda Cogan, BS, RDMS, RVT

## 2017-06-04 NOTE — Transfer of Care (Signed)
Immediate Anesthesia Transfer of Care Note  Patient: Regina Hall  Procedure(s) Performed: INSERTION VENA-CAVA FILTER (N/A )  Patient Location: PACU  Anesthesia Type:General  Level of Consciousness: drowsy and patient cooperative  Airway & Oxygen Therapy: Patient Spontanous Breathing and Patient connected to nasal cannula oxygen  Post-op Assessment: Report given to RN and Post -op Vital signs reviewed and stable  Post vital signs: Reviewed and stable  Last Vitals:  Vitals Value Taken Time  BP 139/70 06/04/2017  4:56 PM  Temp    Pulse 98 06/04/2017  4:57 PM  Resp 19 06/04/2017  4:57 PM  SpO2 97 % 06/04/2017  4:57 PM  Vitals shown include unvalidated device data.  Last Pain:  Vitals:   06/04/17 1349  TempSrc: Oral  PainSc:       Patients Stated Pain Goal: 0 (17/71/16 5790)  Complications: No apparent anesthesia complications

## 2017-06-04 NOTE — Consult Note (Addendum)
VASCULAR SURGERY ASSESSMENT & PLAN:   CONSULT FOR POSSIBLE PLACEMENT OF IVC FILTER: This patient has a DVT in the left femoral vein and common femoral vein.  It is difficult to determine how old the clot is however it does not appear to be chronic at least in the femoral vein.  She was receiving Lovenox and was started on Coumadin at the referring hospital.  She developed hematoma in her right thigh by CT scan.  She has a long-standing history of chronic venous insufficiency and lymphedema.  I have explained to the patient and her family that there are 3 options.  Given that she has a DVT with contraindication to heparin currently I think it would be reasonable to place an IVC filter.  A second option would be to hold off on anticoagulation given her hematoma in the right thigh and given that the DVT on the left may potentially be chronic (however I feel that the clot is likely not chronic) .  Finally we could consider slowly resuming anticoagulation (given the hematoma in the right thigh with bruising in both legs I think she would be at significant risk for further bleeding problems).    All things considered, I would favor placement of an IVC filter.  We have discussed the indications for the procedure.  In addition I have discussed the potential complications of filter placement including, but not limited to, bleeding, migration of the filter, Pulmonary embolus, and  IVC thrombosis. I have also explained that once it is safe to resume anticoagulation, we would generally try to remove the filter although sometimes this is not possible.  I have discussed this with the patient and her family and they are agreeable to proceed.  Regina Mayo, MD, [place] [phone] Office: [phone]   Reason for Consult:  Regarding IVC filster Requesting Physician:  Regina Hall MRN #:  [id number]  History of Present Illness: This is a 83 y.o. female who was seen by Dr. Scot Hall 6 days ago in regards to leg  swelling.  During that visit, she was found to have a DVT in the left common femoral vein and femoral vein.  It was difficult to determine the chronicity of the clot but regardless of this, he placed her on Xarelto.  She was unable to get this and was recommended to go to the hospital for heparin bridge to coumadin.   She states that on Saturday or Sunday she started having right leg pain.  Per [facility] note, a CT scan was obtained (cannot find this in the chart) and found to have a 8 x7 x5cm hematoma in the right thigh and her anticoagulation was discontinued.  She states she is unable to stand on the right leg due to pain and she states this is her good leg. [facility] is consulted for recommendations on the IVC filter.  She underwent removal of a BCC from her face in April.  She denies any trouble breathing or shortness of breath.   Also during Dr. Nicole Hall office visit, the pt was noted to have cellulitis & was given an rx for Keflex for 2 weeks.  She did not have time to get this filled and she is not on abx at this time. She is unsure if she got abx at [facility].   Past Medical History:  Diagnosis Date  . Anemia   . Anxiety    pt denies  . Arthritis    KNEES AND BACK  . Bladder incontinence   .  Cancer ([place])    basal cell cancer on nose  . Edema of both feet   . Hypertension   . Pneumonia   . PONV (postoperative nausea and vomiting)    PONV X 1 EPISODE, COMES OUT OF ANESTHESIA FAST, SOME AWARENESS DURING END OF COLONSCOPY  . Scoliosis     Past Surgical History:  Procedure Laterality Date  . ABDOMINAL HYSTERECTOMY     2006 VAG HYST  . BACK SURGERY     LOWER, X2  . BIOPSY Right 05/02/2017   Procedure: BIOPSY OF RIGHT UPPER EYELID LESION;  Surgeon: Regina Bernhardt, MD;  Location: [place];  Service: Ophthalmology;  Laterality: Right;  . EYE SURGERY Bilateral    cataract surgery with lens implant  . JOINT REPLACEMENT     2011 LT HIP  . RECONSTRUCTION OF EYELID Right 05/02/2017     Procedure: TOTAL RECONSTRUCTION OF UPPER EYELID RIGHT EYE WITH FULL THICKNESS SKIN GRAFT FROM RIGHT POSTERIOR EAR;  Surgeon: Regina Bernhardt, MD;  Location: [place];  Service: Ophthalmology;  Laterality: Right;  . TONSILLECTOMY    . TOTAL KNEE ARTHROPLASTY Right 04/27/2014   Procedure: [place] TOTAL KNEE ARTHROPLASTY;  Surgeon: Regina Can, MD;  Location: [facility];  Service: Orthopedics;  Laterality: Right;  . TUBAL LIGATION     1974    Allergies  Allergen Reactions  . Penicillins Rash and Other (See Comments)    Has patient had a PCN reaction causing immediate rash, facial/tongue/throat swelling, SOB or lightheadedness with hypotension: ###Yes## Has patient had a PCN reaction causing severe rash involving mucus membranes or skin necrosis: No Has patient had a PCN reaction that required hospitalization No Has patient had a PCN reaction occurring within the last 10 years: No If all of the above answers are "NO", then may proceed with Cephalosporin use.     Prior to Admission medications   Medication Sig Start Date End Date Taking? Authorizing Provider  Biotin 10 MG TABS Take 10 mg by mouth every morning.     [provider]  cephALEXin (KEFLEX) 500 MG capsule Take 1 capsule (500 mg total) by mouth 3 (three) times daily. 05/29/17   Regina Mould, MD  Cholecalciferol (VITAMIN D3) 5000 units TABS Take 5,000 Units by mouth daily.    [provider]  Coenzyme Q10 (CO Q 10 PO) Take 1 capsule by mouth daily.    [provider]  gabapentin (NEURONTIN) 100 MG capsule Take 100 mg by mouth at bedtime.    [provider]  GLUCOSAMINE SULFATE PO Take 2 tablets by mouth daily.     [provider]  HYDROcodone-acetaminophen (NORCO) 10-325 MG tablet Take 0.5-1 tablets by mouth 2 (two) times daily as needed for moderate pain.     [provider]  Hypromellose (ARTIFICIAL TEARS OP) Place 2 drops into both eyes daily as needed (for dry eyes).     [provider]  losartan (COZAAR) 25 MG tablet Take 25 mg by mouth every morning.    [provider]  Oxymetazoline HCl (SINUS RELIEF NA) Place 2 sprays into both nostrils daily as needed (for congestion).     [provider]  Rivaroxaban 15 & 20 MG TBPK Take as directed on package: Start with one 15mg  tablet by mouth twice a day with food. On Day 22, switch to one 20mg  tablet once a day with food. 05/29/17   Regina Mould, MD  triamterene-hydrochlorothiazide (DYAZIDE) 37.5-25 MG per capsule Take 1 capsule by  mouth every morning.     [provider]    Social History   Socioeconomic History  . Marital status: Married    Spouse name: Not on file  . Number of children: Not on file  . Years of education: Not on file  . Highest education level: Not on file  Occupational History  . Not on file  Social Needs  . Financial resource strain: Not on file  . Food insecurity:    Worry: Not on file    Inability: Not on file  . Transportation needs:    Medical: Not on file    Non-medical: Not on file  Tobacco Use  . Smoking status: Never Smoker  . Smokeless tobacco: Never Used  Substance and Sexual Activity  . Alcohol use: Yes    Comment: OCC WINE  . Drug use: No  . Sexual activity: Not on file  Lifestyle  . Physical activity:    Days per week: Not on file    Minutes per session: Not on file  . Stress: Not on file  Relationships  . Social connections:    Talks on phone: Not on file    Gets together: Not on file    Attends religious service: Not on file    Active member of club or organization: Not on file    Attends meetings of clubs or organizations: Not on file    Relationship status: Not on file  . Intimate partner violence:    Fear of current or ex partner: Not on file    Emotionally abused: Not on file    Physically abused: Not on file    Forced sexual activity: Not on file  Other Topics Concern  . Not on file  Social History  Narrative  . Not on file     Family History  Problem Relation Age of Onset  . Congestive Heart Failure Mother   . Colon cancer Father   . Cancer Brother     ROS: [x]  Positive   [ ]  Negative   [ ]  All sytems reviewed and are negative  Cardiac: []  chest pain/pressure []  palpitations []  SOB lying flat []  DOE  Vascular: []  pain in right leg []  pain in legs at rest []  pain in legs at night []  non-healing ulcers [x]  hx of DVT [x]  swelling in legs  Pulmonary: []  productive cough []  asthma/wheezing []  home O2  Neurologic: []  weakness in []  arms []  legs []  numbness in []  arms []  legs []  hx of CVA []  mini stroke [] difficulty speaking or slurred speech []  temporary loss of vision in one eye []  dizziness  Hematologic: [x]  hx of cancer-BCC []  bleeding problems []  problems with blood clotting easily  Endocrine:   []  diabetes []  thyroid disease  GI []  vomiting blood []  blood in stool  GU: []  CKD/renal failure []  HD--[]  M/W/F or []  T/T/S []  burning with urination []  blood in urine  Psychiatric: []  anxiety []  depression  Musculoskeletal: [x]  arthritis []  joint pain  Integumentary: []  redness lower legs  Constitutional: []  fever []  chills   Physical Examination  Vitals:   06/04/17 0104 06/04/17 0521  BP: (!) 160/73 (!) 124/54  Pulse: 98 89  Resp: 16 20  Temp: 98.9 F (37.2 C) 98.7 F (37.1 C)  SpO2: 96% 94%   Body mass index is 38.23 kg/m.  General:  WDWN in NAD Gait: Not observed HENT: WNL, normocephalic Pulmonary: normal non-labored breathing Cardiac: regular, without  Murmurs, rubs or gallops;  without carotid bruits Abdomen:  soft, NT/ND, no masses Skin: without rashes Vascular Exam/Pulses: Unable to palpate pedal pulses due to swelling Extremities:  She does have some erythema of her legs below the knees with right > left; large area of ecchymosis right inner thigh; swelling BLE  Musculoskeletal: no muscle wasting or  atrophy  Neurologic: A&O X 3;  No focal weakness or paresthesias are detected; speech is fluent/normal Psychiatric:  The pt has Normal affect.  CBC    Component Value Date/Time   WBC 7.7 06/04/2017 0452   RBC 2.66 (L) 06/04/2017 0452   HGB 8.2 (L) 06/04/2017 0452   HCT 25.2 (L) 06/04/2017 0452   PLT 135 (L) 06/04/2017 0452   MCV 94.7 06/04/2017 0452   MCH 30.8 06/04/2017 0452   MCHC 32.5 06/04/2017 0452   RDW 13.2 06/04/2017 0452   LYMPHSABS 1.7 06/04/2017 0151   MONOABS 1.0 06/04/2017 0151   EOSABS 0.4 06/04/2017 0151   BASOSABS 0.0 06/04/2017 0151    BMET    Component Value Date/Time   NA 140 06/04/2017 0452   K 3.9 06/04/2017 0452   CL 107 06/04/2017 0452   CO2 27 06/04/2017 0452   GLUCOSE 117 (H) 06/04/2017 0452   BUN 16 06/04/2017 0452   CREATININE 1.05 (H) 06/04/2017 0452   CALCIUM 8.7 (L) 06/04/2017 0452   GFRNONAA 49 (L) 06/04/2017 0452   GFRAA 57 (L) 06/04/2017 0452    COAGS: Lab Results  Component Value Date   INR 1.12 05/02/2017   INR 1.00 04/21/2014   INR 1.20 03/22/2010     Non-Invasive Vascular Imaging:   Unable to find results from CT scan  Statin:  No. Beta Blocker:  No. Aspirin:  No. ACEI:  No. ARB:  Yes.   CCB use:  No Other antiplatelets/anticoagulants:  No.    ASSESSMENT/PLAN: This is a 83 y.o. female with BLE swelling/lymphedema and left CFV DVT   -pt was recently placed on Xarelto, but could not get and was admitted to the hospital in [place] for heparin to coumadin bridge.  She developed a spontaneous hematoma in her right thigh and AC was discontinued.  She does have a hx of DVT in the left leg, however Dr. Scot Hall is unsure how old this clot is.   -he will be by to see pt and give further recommendations on the IVC filter placement.   -cellulitis BLE right > left-may benefit from abx.  Dr. Scot Hall had prescribed Keflex last week but pt unsure if she received any abx in the hospital.     Leontine Locket, PA-C [facility] [phone]

## 2017-06-05 ENCOUNTER — Other Ambulatory Visit: Payer: Self-pay

## 2017-06-05 ENCOUNTER — Encounter (HOSPITAL_COMMUNITY): Payer: Self-pay | Admitting: Vascular Surgery

## 2017-06-05 LAB — COMPREHENSIVE METABOLIC PANEL
ALBUMIN: 2.7 g/dL — AB (ref 3.5–5.0)
ALK PHOS: 49 U/L (ref 38–126)
ALT: 11 U/L — AB (ref 14–54)
AST: 21 U/L (ref 15–41)
Anion gap: 9 (ref 5–15)
BILIRUBIN TOTAL: 0.7 mg/dL (ref 0.3–1.2)
BUN: 23 mg/dL — AB (ref 6–20)
CALCIUM: 8.7 mg/dL — AB (ref 8.9–10.3)
CO2: 25 mmol/L (ref 22–32)
CREATININE: 1.05 mg/dL — AB (ref 0.44–1.00)
Chloride: 105 mmol/L (ref 101–111)
GFR calc Af Amer: 57 mL/min — ABNORMAL LOW (ref >60)
GFR, EST NON AFRICAN AMERICAN: 49 mL/min — AB (ref >60)
GLUCOSE: 166 mg/dL — AB (ref 65–99)
POTASSIUM: 4.4 mmol/L (ref 3.5–5.1)
Sodium: 139 mmol/L (ref 135–145)
Total Protein: 5 g/dL — ABNORMAL LOW (ref 6.5–8.1)

## 2017-06-05 LAB — CBC
HCT: 27 % — ABNORMAL LOW (ref 36.0–46.0)
HEMATOCRIT: 26.4 % — AB (ref 36.0–46.0)
HEMOGLOBIN: 8.8 g/dL — AB (ref 12.0–15.0)
Hemoglobin: 8.5 g/dL — ABNORMAL LOW (ref 12.0–15.0)
MCH: 30.4 pg (ref 26.0–34.0)
MCH: 31.2 pg (ref 26.0–34.0)
MCHC: 32.2 g/dL (ref 30.0–36.0)
MCHC: 32.6 g/dL (ref 30.0–36.0)
MCV: 94.3 fL (ref 78.0–100.0)
MCV: 95.7 fL (ref 78.0–100.0)
PLATELETS: 143 10*3/uL — AB (ref 150–400)
PLATELETS: 160 10*3/uL (ref 150–400)
RBC: 2.8 MIL/uL — ABNORMAL LOW (ref 3.87–5.11)
RBC: 2.82 MIL/uL — ABNORMAL LOW (ref 3.87–5.11)
RDW: 13 % (ref 11.5–15.5)
RDW: 13.2 % (ref 11.5–15.5)
WBC: 7.9 10*3/uL (ref 4.0–10.5)
WBC: 11.9 10*3/uL — ABNORMAL HIGH (ref 4.0–10.5)

## 2017-06-05 LAB — GLUCOSE, CAPILLARY
Glucose-Capillary: 133 mg/dL — ABNORMAL HIGH (ref 65–99)
Glucose-Capillary: 224 mg/dL — ABNORMAL HIGH (ref 65–99)

## 2017-06-05 MED ORDER — POLYETHYLENE GLYCOL 3350 17 G PO PACK
17.0000 g | PACK | Freq: Every day | ORAL | Status: DC
  Administered 2017-06-05 – 2017-06-07 (×3): 17 g via ORAL
  Filled 2017-06-05 (×3): qty 1

## 2017-06-05 MED ORDER — SENNOSIDES-DOCUSATE SODIUM 8.6-50 MG PO TABS
2.0000 | ORAL_TABLET | Freq: Once | ORAL | Status: AC
  Administered 2017-06-05: 2 via ORAL
  Filled 2017-06-05: qty 2

## 2017-06-05 NOTE — Plan of Care (Signed)
  Problem: Clinical Measurements: Goal: Will remain free from infection Outcome: Progressing Note:  No s/s of infection noted. Goal: Respiratory complications will improve Outcome: Progressing Note:  No s/s of respiratory complications noted. Goal: Cardiovascular complication will be avoided Outcome: Progressing Note:  No s/s of cardiovascular complications noted.

## 2017-06-05 NOTE — Anesthesia Preprocedure Evaluation (Signed)
Anesthesia Evaluation  Patient identified by MRN, date of birth, ID band Patient awake    Reviewed: Allergy & Precautions, NPO status , Patient's Chart, lab work & pertinent test results  History of Anesthesia Complications (+) PONV  Airway Mallampati: II  TM Distance: >3 FB     Dental   Pulmonary pneumonia,    breath sounds clear to auscultation       Cardiovascular hypertension,  Rhythm:Regular Rate:Normal     Neuro/Psych    GI/Hepatic negative GI ROS, Neg liver ROS,   Endo/Other    Renal/GU Renal disease     Musculoskeletal  (+) Arthritis ,   Abdominal   Peds  Hematology  (+) anemia ,   Anesthesia Other Findings   Reproductive/Obstetrics                             Anesthesia Physical Anesthesia Plan  ASA: III  Anesthesia Plan: General   Post-op Pain Management:    Induction: Intravenous  PONV Risk Score and Plan: 4 or greater  Airway Management Planned: Oral ETT  Additional Equipment:   Intra-op Plan:   Post-operative Plan: Extubation in OR  Informed Consent: I have reviewed the patients History and Physical, chart, labs and discussed the procedure including the risks, benefits and alternatives for the proposed anesthesia with the patient or authorized representative who has indicated his/her understanding and acceptance.   Dental advisory given  Plan Discussed with: CRNA and Anesthesiologist  Anesthesia Plan Comments:         Anesthesia Quick Evaluation

## 2017-06-05 NOTE — Progress Notes (Signed)
Patient Demographics:    Regina Hall, is a 83 y.o. female, DOB - 1934-04-25, WGN:[id number]  Admit date - 06/04/2017   Admitting Physician Karmen Bongo, MD  Outpatient Primary MD for the patient is Sandi Mealy, MD  LOS - 1   No chief complaint on file.       Subjective:    [name] today has no fevers, no emesis,  No chest pain, no new concerns  Assessment  & Plan :    Principal Problem:   Hematoma of right thigh Active Problems:   DVT (deep venous thrombosis) (HCC)   CKD (chronic kidney disease) stage 2, GFR 60-89 ml/min   Essential hypertension   Acute blood loss anemia  Brief Summary:- 83 y.o. female with history of hypertension, chronic kidney disease stage II who was recently diagnosed with left lower extremity DVT,  Patient was admitted to [facility] and was started on heparin and Coumadin bridging, patient readmitted to [facility] 06/04/2017 with finding of right thigh hematoma/ 8 x 7 x 5 cm hematoma of the right thigh. She underwent IVC filter placement on 06/04/2017   Plan:-  1) Recent DVT  of left common femoral vein- Rt Thigh hematoma after starting anticoagulation,  vascular surgery consulted  S/p  IVC filter placement on 06/04/17, patient may not be a good candidate for long-term anticoagulation given that she had a spontaneous hematoma in her right thigh fairly quickly after being started on Coumadin. Follow  back in 6 weeks with vascular surgeon Dr. Rachelle Hora to discuss possibly leaving IVC in place or removing it at that time . if she can safely be restarted on anticoagulation once she is at lower risk for bleeding complications and certainly we could consider filter removal   2)Acute blood loss anemia hemoglobin was around 12, now 8, stable  if it trends down then she will need transfusion, follow CBC closely  3)Hypertension -Maxzide and losartan remains on  hold ,   may use IV Hydralazine 10 mg  Every 4 hours Prn for systolic blood pressure over 160 mmhg  4)chronic kidney disease stage II -creatinine appears to be at baseline..  DVT prophylaxis: No DVT prophylaxis since patient has DVT and also hematoma. Code Status: Full code. Family Communication: Discussed with patient. Disposition Plan:  SNF (awaiting PASSR)  Code Status : full  Consults  : Vascular surgeon Dr. Rachelle Hora  Lab Results  Component Value Date   PLT 160 06/05/2017    Inpatient Medications  Scheduled Meds: . gabapentin  100 mg Oral QHS  . polyethylene glycol  17 g Oral Daily   Continuous Infusions: PRN Meds:.acetaminophen **OR** acetaminophen, hydrALAZINE, morphine injection, ondansetron **OR** ondansetron (ZOFRAN) IV    Anti-infectives (From admission, onward)   None        Objective:   Vitals:   06/05/17 0458 06/05/17 0507 06/05/17 0607 06/05/17 1300  BP: 119/62 (!) 122/59 125/63 (!) 116/51  Pulse: 69 66 67 83  Resp: (!) 29 19 20 20   Temp: 98 F (36.7 C)   98.2 F (36.8 C)  TempSrc: Oral   Oral  SpO2: 100% 100% 100% 96%  Weight:      Height:        Wt Readings from Last 3  Encounters:  06/04/17 104.2 kg (229 lb 11.5 oz)  05/29/17 106.1 kg (234 lb)  05/02/17 106.1 kg (234 lb)     Intake/Output Summary (Last 24 hours) at 06/05/2017 2004 Last data filed at 06/05/2017 1707 Gross per 24 hour  Intake 1720 ml  Output 1300 ml  Net 420 ml     Physical Exam  Gen:- Awake Alert,  In no apparent distress  HEENT:- [place].AT, No sclera icterus Neck-Supple Neck,No JVD,.  Lungs-  CTAB , good air movement CV- S1, S2 normal Abd-  +ve B.Sounds, Abd Soft, No tenderness,    Extremity/Skin:-Right thigh hematoma and bruising Psych-affect is appropriate, oriented x3 Neuro-no new focal deficits, no tremors   Data Review:   Micro Results No results found for this or any previous visit (from the past 240 hour(s)).  Radiology Reports No results found.    CBC Recent Labs  Lab 06/04/17 0151 06/04/17 0452 06/04/17 1822 06/05/17 0649 06/05/17 1806  WBC 7.8 7.7 8.3 7.9 11.9*  HGB 8.6* 8.2* 8.9* 8.5* 8.8*  HCT 26.6* 25.2* 27.7* 26.4* 27.0*  PLT 137* 135* 101* 143* 160  MCV 94.0 94.7 95.2 94.3 95.7  MCH 30.4 30.8 30.6 30.4 31.2  MCHC 32.3 32.5 32.1 32.2 32.6  RDW 13.1 13.2 13.2 13.0 13.2  LYMPHSABS 1.7  --   --   --   --   MONOABS 1.0  --   --   --   --   EOSABS 0.4  --   --   --   --   BASOSABS 0.0  --   --   --   --     Chemistries  Recent Labs  Lab 06/04/17 0151 06/04/17 0452 06/05/17 0649  NA 139 140 139  K 4.0 3.9 4.4  CL 106 107 105  CO2 26 27 25   GLUCOSE 120* 117* 166*  BUN 18 16 23*  CREATININE 1.09* 1.05* 1.05*  CALCIUM 8.8* 8.7* 8.7*  AST 19  --  21  ALT 10*  --  11*  ALKPHOS 54  --  49  BILITOT 0.5  --  0.7   ------------------------------------------------------------------------------------------------------------------ No results for input(s): CHOL, HDL, LDLCALC, TRIG, CHOLHDL, LDLDIRECT in the last 72 hours.  No results found for: HGBA1C ------------------------------------------------------------------------------------------------------------------ No results for input(s): TSH, T4TOTAL, T3FREE, THYROIDAB in the last 72 hours.  Invalid input(s): FREET3 ------------------------------------------------------------------------------------------------------------------ No results for input(s): VITAMINB12, FOLATE, FERRITIN, TIBC, IRON, RETICCTPCT in the last 72 hours.  Coagulation profile No results for input(s): INR, PROTIME in the last 168 hours.  No results for input(s): DDIMER in the last 72 hours.  Cardiac Enzymes No results for input(s): CKMB, TROPONINI, MYOGLOBIN in the last 168 hours.  Invalid input(s): CK ------------------------------------------------------------------------------------------------------------------ No results found for: BNP   Roxan Hockey M.D on 06/05/2017 at 8:04  PM  Between 7am to 7pm - Pager - [phone]  After 7pm go to [email or web address] - password [email or web address]  [facility] -  Office  [phone]   Voice Recognition Viviann Spare dictation system was used to create this note, attempts have been made to correct errors. Please contact the author with questions and/or clarifications.

## 2017-06-05 NOTE — Progress Notes (Signed)
   VASCULAR SURGERY ASSESSMENT & PLAN:   1 Day Post-Op s/p: Placement of IVC filter.  Given that she had a spontaneous hematoma in the right thigh right after being started on Lovenox and Coumadin it would probably be best to hold her anticoagulation for now.  Once the hematoma has resolved we could reconsider starting anticoagulation however I suspect she will be at high risk for bleeding.  I plan on seeing her back in 6 weeks in the office to discuss her IVC filter.  Given that she may not be able to tolerate anticoagulation, it may be best to leave her filter permanently.  If she can be restarted on anticoagulation then certainly we could consider removal of the filter in 2 to 3 months.  She can be discharged from my standpoint and I have arranged follow-up in 6 weeks.  SUBJECTIVE:   No complaints.  PHYSICAL EXAM:   Vitals:   06/04/17 1741 06/04/17 1756 06/04/17 2100 06/05/17 0458  BP: 134/71 133/68 132/72 119/62  Pulse: 90 88 92 69  Resp: 20 18 (!) 22 (!) 29  Temp:  98.4 F (36.9 C) 97.7 F (36.5 C) 98 F (36.7 C)  TempSrc:   Oral Oral  SpO2: 98% 98% 98% 100%  Weight:      Height:       Her right groin site looks fine. Her leg swelling has improved with leg elevation.  LABS:   Lab Results  Component Value Date   WBC 8.3 06/04/2017   HGB 8.9 (L) 06/04/2017   HCT 27.7 (L) 06/04/2017   MCV 95.2 06/04/2017   PLT 101 (L) 06/04/2017   Lab Results  Component Value Date   CREATININE 1.05 (H) 06/04/2017   Lab Results  Component Value Date   INR 1.12 05/02/2017   CBG (last 3)  Recent Labs    06/05/17 0027  GLUCAP 224*    PROBLEM LIST:    Principal Problem:   Hematoma of right thigh Active Problems:   DVT (deep venous thrombosis) (HCC)   CKD (chronic kidney disease) stage 2, GFR 60-89 ml/min   Essential hypertension   Acute blood loss anemia   CURRENT MEDS:   . gabapentin  100 mg Oral QHS    Deitra Mayo Beeper: [phone] Office:  [phone] 06/05/2017

## 2017-06-05 NOTE — Anesthesia Postprocedure Evaluation (Signed)
Anesthesia Post Note  Patient: Regina Hall  Procedure(s) Performed: INSERTION VENA-CAVA FILTER (N/A )     Patient location during evaluation: PACU Anesthesia Type: General Vital Signs Assessment: post-procedure vital signs reviewed and stable Cardiovascular status: stable Anesthetic complications: no    Last Vitals:  Vitals:   06/05/17 0507 06/05/17 0607  BP: (!) 122/59 125/63  Pulse: 66 67  Resp: 19 20  Temp:    SpO2: 100% 100%    Last Pain:  Vitals:   06/05/17 0458  TempSrc: Oral  PainSc:                  Yohann Curl

## 2017-06-06 ENCOUNTER — Telehealth: Payer: Self-pay | Admitting: Vascular Surgery

## 2017-06-06 LAB — CBC
HCT: 24.5 % — ABNORMAL LOW (ref 36.0–46.0)
Hemoglobin: 7.9 g/dL — ABNORMAL LOW (ref 12.0–15.0)
MCH: 31 pg (ref 26.0–34.0)
MCHC: 32.2 g/dL (ref 30.0–36.0)
MCV: 96.1 fL (ref 78.0–100.0)
Platelets: 160 10*3/uL (ref 150–400)
RBC: 2.55 MIL/uL — ABNORMAL LOW (ref 3.87–5.11)
RDW: 13.3 % (ref 11.5–15.5)
WBC: 9 10*3/uL (ref 4.0–10.5)

## 2017-06-06 LAB — BASIC METABOLIC PANEL WITH GFR
Anion gap: 8 (ref 5–15)
BUN: 30 mg/dL — ABNORMAL HIGH (ref 6–20)
CO2: 27 mmol/L (ref 22–32)
Calcium: 8.7 mg/dL — ABNORMAL LOW (ref 8.9–10.3)
Chloride: 107 mmol/L (ref 101–111)
Creatinine, Ser: 1.03 mg/dL — ABNORMAL HIGH (ref 0.44–1.00)
GFR calc Af Amer: 58 mL/min — ABNORMAL LOW
GFR calc non Af Amer: 50 mL/min — ABNORMAL LOW
Glucose, Bld: 110 mg/dL — ABNORMAL HIGH (ref 65–99)
Potassium: 4.4 mmol/L (ref 3.5–5.1)
Sodium: 142 mmol/L (ref 135–145)

## 2017-06-06 LAB — GLUCOSE, CAPILLARY
GLUCOSE-CAPILLARY: 138 mg/dL — AB (ref 65–99)
Glucose-Capillary: 108 mg/dL — ABNORMAL HIGH (ref 65–99)
Glucose-Capillary: 97 mg/dL (ref 65–99)

## 2017-06-06 MED ORDER — PHENOL 1.4 % MT LIQD
1.0000 | OROMUCOSAL | Status: DC | PRN
  Administered 2017-06-06: 1 via OROMUCOSAL
  Filled 2017-06-06: qty 177

## 2017-06-06 MED ORDER — LOSARTAN POTASSIUM 25 MG PO TABS
25.0000 mg | ORAL_TABLET | Freq: Every day | ORAL | Status: DC
Start: 2017-06-06 — End: 2017-06-07
  Administered 2017-06-06 – 2017-06-07 (×2): 25 mg via ORAL
  Filled 2017-06-06 (×2): qty 1

## 2017-06-06 MED ORDER — FERROUS SULFATE 325 (65 FE) MG PO TABS
325.0000 mg | ORAL_TABLET | Freq: Two times a day (BID) | ORAL | Status: DC
  Administered 2017-06-06 – 2017-06-07 (×2): 325 mg via ORAL
  Filled 2017-06-06 (×2): qty 1

## 2017-06-06 NOTE — NC FL2 (Addendum)
[place] LEVEL OF CARE SCREENING TOOL     IDENTIFICATION  Patient Name: Regina Hall Birthdate: [birth year] Sex: female Admission Date (Current Location): 06/04/2017  [facility] and [place] Number:  Herbalist and Address:  [facility], [place] [address] [place], [place] [address]      Provider Number: [id number]  Attending Physician Name and Address:  Roxan Hockey, MD  Relative Name and Phone Number:  Candace Cruise, spouse, [phone]    Current Level of Care: Hospital Recommended Level of Care: [place] Prior Approval Number:    Date Approved/Denied:   PASRR Number:   [id number] (5/24 - 06/28/17)  Discharge Plan: SNF    Current Diagnoses: Patient Active Problem List   Diagnosis Date Noted  . DVT (deep venous thrombosis) ([place]) 06/04/2017  . Hematoma of right thigh 06/04/2017  . CKD (chronic kidney disease) stage 2, GFR 60-89 ml/min 06/04/2017  . Essential hypertension 06/04/2017  . Acute blood loss anemia 06/04/2017  . Scoliosis of lumbar spine 03/28/2015  . Primary osteoarthritis of right knee 04/27/2014    Orientation RESPIRATION BLADDER Height & Weight     Self, Time, Situation, Place  Normal Incontinent, External catheter Weight: 232 lb 12.9 oz (105.6 kg) Height:  5\' 5"  (165.1 cm)  BEHAVIORAL SYMPTOMS/MOOD NEUROLOGICAL BOWEL NUTRITION STATUS      Continent Diet(please see DC summary)  AMBULATORY STATUS COMMUNICATION OF NEEDS Skin   Limited Assist Verbally Surgical wounds(closed incision R groin, gauze)                       Personal Care Assistance Level of Assistance  Bathing, Feeding, Dressing Bathing Assistance: Limited assistance Feeding assistance: Independent Dressing Assistance: Limited assistance     Functional Limitations Info  Sight, Hearing, Speech Sight Info: Adequate Hearing Info: Adequate Speech Info: Adequate    SPECIAL CARE FACTORS FREQUENCY  PT (By licensed  PT)     PT Frequency: 5x/week              Contractures Contractures Info: Not present    Additional Factors Info  Code Status, Allergies Code Status Info: Full Allergies Info: Penicillins           Current Medications (06/06/2017):  This is the current hospital active medication list Current Facility-Administered Medications  Medication Dose Route Frequency Provider Last Rate Last Dose  . acetaminophen (TYLENOL) tablet 650 mg  650 mg Oral Q6H PRN Angelia Mould, MD   650 mg at 06/06/17 1023   Or  . acetaminophen (TYLENOL) suppository 650 mg  650 mg Rectal Q6H PRN Angelia Mould, MD      . gabapentin (NEURONTIN) capsule 100 mg  100 mg Oral QHS Angelia Mould, MD   100 mg at 06/05/17 2217  . hydrALAZINE (APRESOLINE) injection 10 mg  10 mg Intravenous Q4H PRN Angelia Mould, MD      . morphine 2 MG/ML injection 2 mg  2 mg Intravenous Q4H PRN Angelia Mould, MD   2 mg at 06/06/17 1023  . ondansetron (ZOFRAN) tablet 4 mg  4 mg Oral Q6H PRN Angelia Mould, MD       Or  . ondansetron [facility]) injection 4 mg  4 mg Intravenous Q6H PRN Angelia Mould, MD      . phenol (CHLORASEPTIC) mouth spray 1 spray  1 spray Mouth/Throat PRN Roxan Hockey, MD   1 spray at 06/06/17 0140  . polyethylene  glycol (MIRALAX / GLYCOLAX) packet 17 g  17 g Oral Daily Emokpae, Courage, MD   17 g at 06/06/17 1002   Facility-Administered Medications Ordered in Other Encounters  Medication Dose Route Frequency Provider Last Rate Last Dose  . ondansetron (ZOFRAN) 4 mg in sodium chloride 0.9 % 50 mL IVPB  4 mg Intravenous Once Rod Can, MD         Discharge Medications: Please see discharge summary for a list of discharge medications.  Relevant Imaging Results:  Relevant Lab Results:   Additional Information SSN: [id number]  Estanislado Emms, LCSW

## 2017-06-06 NOTE — Telephone Encounter (Signed)
sch appt 07-24-17 945am p/o IVC s/p IVC placement

## 2017-06-06 NOTE — Progress Notes (Signed)
Patient Demographics:    Regina Hall, is a 83 y.o. female, DOB - 21-Oct-1934, INO:[id number]  Admit date - 06/04/2017   Admitting Physician Karmen Bongo, MD  Outpatient Primary MD for the patient is Sandi Mealy, MD  LOS - 2   No chief complaint on file.       Subjective:    [name] today has no fevers, no emesis,  No chest pain, eating and drinking well  Assessment  & Plan :    Principal Problem:   Hematoma of right thigh Active Problems:   DVT (deep venous thrombosis) (HCC)   CKD (chronic kidney disease) stage 2, GFR 60-89 ml/min   Essential hypertension   Acute blood loss anemia  Brief Summary:- 83 y.o. female with history of hypertension, chronic kidney disease stage II who was recently diagnosed with left lower extremity DVT,  Patient was admitted to [facility] and was started on heparin and Coumadin bridging, patient readmitted to [facility] 06/04/2017 with finding of right thigh hematoma/ 8 x 7 x 5 cm hematoma of the right thigh. She underwent IVC filter placement on 06/04/2017   Plan:-  1) Recent DVT  of left common femoral vein-patient states right thigh discomfort is better, she developed Rt Thigh hematoma shortly after starting anticoagulation,  vascular surgery consulted  S/p  IVC filter placement on 06/04/17, patient may not be a good candidate for long-term anticoagulation given that she had a spontaneous hematoma in her right thigh fairly quickly after being started on Coumadin. Follow  back in 6 weeks with vascular surgeon Dr. Rachelle Hora to discuss possibly leaving IVC in place or removing it at that time . if she can safely be restarted on anticoagulation once she is at lower risk for bleeding complications and certainly we could consider filter removal   2)Acute blood loss anemia --- due to right thigh hematoma in the setting of anticoagulation, hemoglobin was  around 12, now 7.9,   if it trends down then she will need transfusion, follow CBC closely, give iron supplements  3)Hypertension -stable, continue to hold Maxzide, restart Losartan, may use IV Hydralazine 10 mg  Every 4 hours Prn for systolic blood pressure over 160 mmhg  4)chronic kidney disease stage II -creatinine appears to be at baseline..  DVT prophylaxis: No DVT prophylaxis since patient has DVT and also hematoma. Code Status: Full code. Family Communication: Discussed with patient. Disposition Plan:  SNF (awaiting PASSR)  Code Status : Full  Consults  : Vascular surgeon Dr. Rachelle Hora  Lab Results  Component Value Date   PLT 160 06/06/2017    Inpatient Medications  Scheduled Meds: . gabapentin  100 mg Oral QHS  . losartan  25 mg Oral Daily  . polyethylene glycol  17 g Oral Daily   Continuous Infusions: PRN Meds:.acetaminophen **OR** acetaminophen, hydrALAZINE, morphine injection, ondansetron **OR** ondansetron (ZOFRAN) IV, phenol   Anti-infectives (From admission, onward)   None        Objective:   Vitals:   06/05/17 2136 06/05/17 2220 06/06/17 0558 06/06/17 1324  BP: (!) 131/58  123/65 124/68  Pulse: 88  73 72  Resp: (!) 21 (!) 25 19 18   Temp: 99 F (37.2 C)  98.2 F (36.8 C) 98.4 F (36.9  C)  TempSrc: Oral  Oral Oral  SpO2: 95%  97% 98%  Weight:   105.6 kg (232 lb 12.9 oz)   Height:        Wt Readings from Last 3 Encounters:  06/06/17 105.6 kg (232 lb 12.9 oz)  05/29/17 106.1 kg (234 lb)  05/02/17 106.1 kg (234 lb)     Intake/Output Summary (Last 24 hours) at 06/06/2017 1654 Last data filed at 06/06/2017 1500 Gross per 24 hour  Intake 720 ml  Output 1750 ml  Net -1030 ml    Physical Exam  Gen:- Awake Alert,  In no apparent distress  HEENT:- [place].AT, No sclera icterus Neck-Supple Neck,No JVD,.  Lungs-  CTAB , good air movement CV- S1, S2 normal Abd-  +ve B.Sounds, Abd Soft, No tenderness,    Extremity/Skin:-Right thigh hematoma and  bruising, tender to palpation Psych-affect is appropriate, oriented x3 Neuro-no new focal deficits, no tremors   Data Review:   Micro Results No results found for this or any previous visit (from the past 240 hour(s)).  Radiology Reports No results found.   CBC Recent Labs  Lab 06/04/17 0151 06/04/17 0452 06/04/17 1822 06/05/17 0649 06/05/17 1806 06/06/17 0601  WBC 7.8 7.7 8.3 7.9 11.9* 9.0  HGB 8.6* 8.2* 8.9* 8.5* 8.8* 7.9*  HCT 26.6* 25.2* 27.7* 26.4* 27.0* 24.5*  PLT 137* 135* 101* 143* 160 160  MCV 94.0 94.7 95.2 94.3 95.7 96.1  MCH 30.4 30.8 30.6 30.4 31.2 31.0  MCHC 32.3 32.5 32.1 32.2 32.6 32.2  RDW 13.1 13.2 13.2 13.0 13.2 13.3  LYMPHSABS 1.7  --   --   --   --   --   MONOABS 1.0  --   --   --   --   --   EOSABS 0.4  --   --   --   --   --   BASOSABS 0.0  --   --   --   --   --     Chemistries  Recent Labs  Lab 06/04/17 0151 06/04/17 0452 06/05/17 0649 06/06/17 0601  NA 139 140 139 142  K 4.0 3.9 4.4 4.4  CL 106 107 105 107  CO2 26 27 25 27   GLUCOSE 120* 117* 166* 110*  BUN 18 16 23* 30*  CREATININE 1.09* 1.05* 1.05* 1.03*  CALCIUM 8.8* 8.7* 8.7* 8.7*  AST 19  --  21  --   ALT 10*  --  11*  --   ALKPHOS 54  --  49  --   BILITOT 0.5  --  0.7  --    ------------------------------------------------------------------------------------------------------------------ No results for input(s): CHOL, HDL, LDLCALC, TRIG, CHOLHDL, LDLDIRECT in the last 72 hours.  No results found for: HGBA1C ------------------------------------------------------------------------------------------------------------------ No results for input(s): TSH, T4TOTAL, T3FREE, THYROIDAB in the last 72 hours.  Invalid input(s): FREET3 ------------------------------------------------------------------------------------------------------------------ No results for input(s): VITAMINB12, FOLATE, FERRITIN, TIBC, IRON, RETICCTPCT in the last 72 hours.  Coagulation profile No results for  input(s): INR, PROTIME in the last 168 hours.  No results for input(s): DDIMER in the last 72 hours.  Cardiac Enzymes No results for input(s): CKMB, TROPONINI, MYOGLOBIN in the last 168 hours.  Invalid input(s): CK ------------------------------------------------------------------------------------------------------------------ No results found for: BNP   Roxan Hockey M.D on 06/06/2017 at 4:54 PM  Between 7am to 7pm - Pager - [phone]  After 7pm go to [email or web address] - password [facility]  [facility] -  Office  [phone]  Voice Recognition Viviann Spare dictation system was used  to create this note, attempts have been made to correct errors. Please contact the author with questions and/or clarifications.

## 2017-06-06 NOTE — Plan of Care (Signed)
Positioned with feet above heart slight trendelenburg  per MD request, hot packs place on lower back, compliant with use of call light to request assistance.

## 2017-06-06 NOTE — Progress Notes (Signed)
   VASCULAR SURGERY ASSESSMENT & PLAN:   2 Days Post-Op s/p: Placement of IVC filter.  I have arranged follow-up in 6 wee to possibly discuss removal of the filter.  However, given that she may not be a good candidate for anticoagulation it may be best to leave the filter.  I will discuss this with her on her follow-up visit.  Continue leg elevation.  Vascular surgery will be available as needed.   SUBJECTIVE:   No complaints.  PHYSICAL EXAM:   Vitals:   06/05/17 1300 06/05/17 2136 06/05/17 2220 06/06/17 0558  BP: (!) 116/51 (!) 131/58  123/65  Pulse: 83 88  73  Resp: 20 (!) 21 (!) 25 19  Temp: 98.2 F (36.8 C) 99 F (37.2 C)  98.2 F (36.8 C)  TempSrc: Oral Oral  Oral  SpO2: 96% 95%  97%  Weight:    232 lb 12.9 oz (105.6 kg)  Height:       Leg swelling is about the same. Right groin looks fine after placement of IVC filter.  LABS:   Lab Results  Component Value Date   WBC 9.0 06/06/2017   HGB 7.9 (L) 06/06/2017   HCT 24.5 (L) 06/06/2017   MCV 96.1 06/06/2017   PLT 160 06/06/2017   Lab Results  Component Value Date   CREATININE 1.03 (H) 06/06/2017   Lab Results  Component Value Date   INR 1.12 05/02/2017   CBG (last 3)  Recent Labs    06/05/17 0027 06/05/17 2339 06/06/17 0717  GLUCAP 224* 133* 97    PROBLEM LIST:    Principal Problem:   Hematoma of right thigh Active Problems:   DVT (deep venous thrombosis) (HCC)   CKD (chronic kidney disease) stage 2, GFR 60-89 ml/min   Essential hypertension   Acute blood loss anemia   CURRENT MEDS:   . gabapentin  100 mg Oral QHS  . polyethylene glycol  17 g Oral Daily    Deitra Mayo Beeper: [phone] Office: [phone] 06/06/2017

## 2017-06-06 NOTE — Progress Notes (Signed)
Patient's PASRR under review because she is a [place] resident. Sent requested clinicals to PASRR, awaiting determination. Patient requires PASRR before admitting to a facility. CSW to provide bed offers to patient and family when available.  Estanislado Emms, [place]

## 2017-06-06 NOTE — Clinical Social Work Note (Signed)
Clinical Social Work Assessment  Patient Details  Name: Regina Hall MRN: [id number] Date of Birth: May 06, 1934  Date of referral:  06/06/17               Reason for consult:  Facility Placement, Discharge Planning                Permission sought to share information with:  Facility Sport and exercise psychologist, Family Supports Permission granted to share information::  Yes, Verbal Permission Granted  Name::     Candace Cruise  Agency::  SNFs  Relationship::  spouse  Contact Information:  [phone]  Housing/Transportation Living arrangements for the past 2 months:  Single Family Home Source of Information:  Patient Patient Interpreter Needed:  None Criminal Activity/Legal Involvement Pertinent to Current Situation/Hospitalization:    Significant Relationships:  Adult Children, Spouse Lives with:  Spouse Do you feel safe going back to the place where you live?  Yes Need for family participation in patient care:  No (Coment)  Care giving concerns: Patient from home in [place] with spouse. PT recommending SNF.  Social Worker assessment / plan: CSW met with patient and daughter in law at bedside. Patient alert and oriented, sitting up and eating lunch. CSW introduced self and role and discussed PT recommendations. Patient and daughter in law agreeable for patient to go to SNF. Patient prefers [employer] [facility].  CSW sent out initial referrals. CSW also screening patient for PASRR; patient is [place] resident, so PASRR for [facility] facility under review. Patient requires PASRR before admitting to a facility. CSW to follow and support with discharge planning.  Employment status:  Retired Forensic scientist:  Medicare PT Recommendations:  [place] / Referral to community resources:  [place]  Patient/Family's Response to care: Patient and daughter in Hydrographic surveyor of care.  Patient/Family's Understanding of and Emotional Response to Diagnosis,  Current Treatment, and Prognosis: Patient with understanding of medical conditions and agreeable to SNF.  Emotional Assessment Appearance:  Appears stated age Attitude/Demeanor/Rapport:  Engaged Affect (typically observed):  Pleasant, Calm, Appropriate Orientation:  Oriented to Self, Oriented to Place, Oriented to  Time, Oriented to Situation Alcohol / Substance use:  Not Applicable Psych involvement (Current and /or in the community):  No (Comment)  Discharge Needs  Concerns to be addressed:  Discharge Planning Concerns, Care Coordination Readmission within the last 30 days:  No Current discharge risk:  Physical Impairment Barriers to Discharge:  Continued Medical Work up, Programmer, applications ([place])   Estanislado Emms, LCSW 06/06/2017, 2:51 PM

## 2017-06-06 NOTE — Telephone Encounter (Signed)
-----   Message from Penni Homans, RN sent at 06/05/2017  8:43 AM EDT ----- Regarding: FW: charge and f/u   ----- Message ----- From: Angelia Mould, MD Sent: 06/04/2017   4:57 PM To: Vvs Charge Pool Subject: charge and f/u                                  PROCEDURE:   1.  Ultrasound-guided access to the right femoral vein 2.  Inferior venacavogram 3.  Placement of inferior vena cava filter (Cook Celect IVC Filter)  SURGEON: Judeth Cornfield. Scot Dock, MD, FACS  She will need a follow-up visit in 6 weeks to discuss possibly removing her IVC filter.  Thank you.

## 2017-06-06 NOTE — Evaluation (Signed)
Physical Therapy Evaluation Patient Details Name: Regina Hall MRN: [id number] DOB: August 19, 1934 Today's Date: 06/06/2017   History of Present Illness  Pt adm with rt thigh hematoma after being started anticoagulation for LLE DVT. Underwent placement of IVC filter on 06/04/17. PMH - ckd, htn, arthritis, back surgery x 3, rt TKR,   Clinical Impression  Pt admitted with above diagnosis and presents to PT with functional limitations due to deficits listed below (See PT problem list). Pt needs skilled PT to maximize independence and safety to allow discharge to ST-SNF prior to return home. Pt currently with very limited mobility and husband unable to assist due to his own medical problems. Expect pt will make steady progress and be able to return home after ST-SNF.     Follow Up Recommendations SNF    Equipment Recommendations  None recommended by PT    Recommendations for Other Services       Precautions / Restrictions Precautions Precautions: Fall Restrictions Weight Bearing Restrictions: No      Mobility  Bed Mobility Overal bed mobility: Needs Assistance Bed Mobility: Supine to Sit;Sit to Sidelying;Rolling Rolling: Min guard   Supine to sit: Min assist   Sit to sidelying: Min assist General bed mobility comments: Assist to bring legs off of bed and elevate trunk into sitting. Assist to bring legs back up into bed.   Transfers Overall transfer level: Needs assistance Equipment used: Rolling walker (2 wheeled) Transfers: Sit to/from Stand Sit to Stand: Min assist         General transfer comment: Assist to bring hips up and for balance.  Ambulation/Gait Ambulation/Gait assistance: Min assist Ambulation Distance (Feet): 3 Feet Assistive device: Rolling walker (2 wheeled) Gait Pattern/deviations: Step-to pattern;Decreased step length - right;Decreased step length - left;Trunk flexed Gait velocity: decr Gait velocity interpretation: <1.8 ft/sec, indicate of risk for  recurrent falls General Gait Details: Pt side-stepped up toward [place]. Assist for balance and support  Stairs            Wheelchair Mobility    Modified Rankin (Stroke Patients Only)       Balance Overall balance assessment: Needs assistance Sitting-balance support: No upper extremity supported;Feet supported Sitting balance-Leahy Scale: Good     Standing balance support: Bilateral upper extremity supported Standing balance-Leahy Scale: Poor Standing balance comment: walker and min guard for static standing                             Pertinent Vitals/Pain Pain Assessment: Faces Faces Pain Scale: Hurts even more Pain Location: Lt hip and leg Pain Descriptors / Indicators: Shooting;Restless;Grimacing Pain Intervention(s): Limited activity within patient's tolerance;Monitored during session;Repositioned;Patient requesting pain meds-RN notified    Home Living Family/patient expects to be discharged to:: Private residence Living Arrangements: Spouse/significant other Available Help at Discharge: Family;Available PRN/intermittently Type of Home: House Home Access: Ramped entrance     Home Layout: One level Home Equipment: Walker - 2 wheels;Walker - 4 wheels;Cane - single point;Bedside commode;Shower seat - built Investment banker, operational      Prior Function Level of Independence: Needs assistance   Gait / Transfers Assistance Needed: amb with walker           Hand Dominance        Extremity/Trunk Assessment   Upper Extremity Assessment Upper Extremity Assessment: Generalized weakness    Lower Extremity Assessment Lower Extremity Assessment: Generalized weakness;RLE deficits/detail RLE Deficits / Details: Limited knee flexion due to pain and  swelling       Communication   Communication: No difficulties  Cognition Arousal/Alertness: Awake/alert Behavior During Therapy: WFL for tasks assessed/performed Overall Cognitive Status: Within  Functional Limits for tasks assessed                                        General Comments      Exercises     Assessment/Plan    PT Assessment Patient needs continued PT services  PT Problem List Decreased strength;Decreased range of motion;Decreased activity tolerance;Decreased balance;Decreased mobility;Obesity;Pain       PT Treatment Interventions DME instruction;Gait training;Functional mobility training;Therapeutic activities;Therapeutic exercise;Balance training;Patient/family education    PT Goals (Current goals can be found in the Care Plan section)  Acute Rehab PT Goals Patient Stated Goal: to get better and be able to eventually return home PT Goal Formulation: With patient Time For Goal Achievement: 06/20/17 Potential to Achieve Goals: Good    Frequency Min 2X/week   Barriers to discharge Decreased caregiver support husband with multiple medical problems and unable to provide assist    Co-evaluation               AM-PAC PT "6 Clicks" Daily Activity  Outcome Measure Difficulty turning over in bed (including adjusting bedclothes, sheets and blankets)?: A Little Difficulty moving from lying on back to sitting on the side of the bed? : Unable Difficulty sitting down on and standing up from a chair with arms (e.g., wheelchair, bedside commode, etc,.)?: Unable Help needed moving to and from a bed to chair (including a wheelchair)?: A Lot Help needed walking in hospital room?: A Lot Help needed climbing 3-5 steps with a railing? : Total 6 Click Score: 10    End of Session   Activity Tolerance: Patient limited by pain Patient left: in bed;with call bell/phone within reach Nurse Communication: Mobility status;Patient requests pain meds PT Visit Diagnosis: Other abnormalities of gait and mobility (R26.89);Muscle weakness (generalized) (M62.81);Pain Pain - Right/Left: Left Pain - part of body: Hip;Leg    Time: 5573-2202 PT Time  Calculation (min) (ACUTE ONLY): 20 min   Charges:   PT Evaluation $PT Eval Moderate Complexity: 1 Mod     PT G CodesMarland Kitchen        [facility] [place] 06/06/2017, 10:29 AM

## 2017-06-07 LAB — CBC
HCT: 26.5 % — ABNORMAL LOW (ref 36.0–46.0)
Hemoglobin: 8.6 g/dL — ABNORMAL LOW (ref 12.0–15.0)
MCH: 31.5 pg (ref 26.0–34.0)
MCHC: 32.5 g/dL (ref 30.0–36.0)
MCV: 97.1 fL (ref 78.0–100.0)
PLATELETS: 173 10*3/uL (ref 150–400)
RBC: 2.73 MIL/uL — AB (ref 3.87–5.11)
RDW: 13.8 % (ref 11.5–15.5)
WBC: 9.6 10*3/uL (ref 4.0–10.5)

## 2017-06-07 LAB — GLUCOSE, CAPILLARY
GLUCOSE-CAPILLARY: 125 mg/dL — AB (ref 65–99)
Glucose-Capillary: 144 mg/dL — ABNORMAL HIGH (ref 65–99)

## 2017-06-07 MED ORDER — METHOCARBAMOL 500 MG PO TABS: 500.0000 mg | ORAL_TABLET | Freq: Three times a day (TID) | ORAL | 1 refills | Status: DC

## 2017-06-07 MED ORDER — HYDROCHLOROTHIAZIDE 12.5 MG PO TABS: 12.5000 mg | ORAL_TABLET | Freq: Every day | ORAL | 1 refills | Status: DC

## 2017-06-07 MED ORDER — LOSARTAN POTASSIUM 25 MG PO TABS: 25.0000 mg | ORAL_TABLET | Freq: Every morning | ORAL | 2 refills | Status: DC

## 2017-06-07 MED ORDER — SENNOSIDES-DOCUSATE SODIUM 8.6-50 MG PO TABS: 2.0000 | ORAL_TABLET | Freq: Every day | ORAL | 1 refills | Status: DC

## 2017-06-07 MED ORDER — POLYETHYLENE GLYCOL 3350 17 G PO PACK: 17.0000 g | PACK | Freq: Every day | ORAL | 1 refills | Status: DC

## 2017-06-07 MED ORDER — FERROUS SULFATE 325 (65 FE) MG PO TABS: 325.0000 mg | ORAL_TABLET | Freq: Two times a day (BID) | ORAL | 3 refills | Status: DC

## 2017-06-07 MED ORDER — HYDROCODONE-ACETAMINOPHEN 10-325 MG PO TABS: 1.0000 | ORAL_TABLET | Freq: Four times a day (QID) | ORAL | 0 refills | Status: DC | PRN

## 2017-06-07 MED ORDER — ACETAMINOPHEN 325 MG PO TABS: 650.0000 mg | ORAL_TABLET | Freq: Four times a day (QID) | ORAL | 1 refills | Status: DC | PRN

## 2017-06-07 NOTE — Progress Notes (Signed)
CSW received patient's PASRR from [facility] - [id number] (valid 06/07/17 - 06/28/17). Paged MD. CSW to support with discharge when medically ready.  Estanislado Emms, [place]

## 2017-06-07 NOTE — Discharge Instructions (Signed)
1)Low-salt diet advised 2)Avoid ibuprofen/Advil/Aleve/Motrin/Goody Powders/Naproxen/BC powders/Meloxicam/Diclofenac/Indomethacin and other Nonsteroidal anti-inflammatory medications as these will make you more likely to bleed and can cause stomach ulcers, can also cause Kidney problems.  3)Repeat CBC/complete blood count and BMP/basic metabolic profile on Monday, 06/10/2017 4) follow-up with vascular surgeon Dr. Rachelle Hora in about 6 weeks to discuss possible removal of your IVC filter

## 2017-06-07 NOTE — Progress Notes (Addendum)
Patient will discharge to [facility] Anticipated discharge date: 06/07/17 Family notified: left voicemails for son and spouse; patient stated she spoke to spouse on the phone and informed him where she is going Transportation by: Corey Harold  Nurse to call report to [phone].  CSW signing off.  Estanislado Emms, [place]  Clinical Social Worker

## 2017-06-07 NOTE — Discharge Summary (Signed)
Regina Hall, is a 83 y.o. female  DOB [birth year]  MRN [id number].  Admission date:  06/04/2017  Admitting Physician  Karmen Bongo, MD  Discharge Date:  06/07/2017   Primary MD  Sandi Mealy, MD  Recommendations for primary care physician for things to follow:   1)Low-salt diet advised 2)Avoid ibuprofen/Advil/Aleve/Motrin/Goody Powders/Naproxen/BC powders/Meloxicam/Diclofenac/Indomethacin and other Nonsteroidal anti-inflammatory medications as these will make you more likely to bleed and can cause stomach ulcers, can also cause Kidney problems.  3)Repeat CBC/complete blood count and BMP/basic metabolic profile on Monday, 06/10/2017 4) follow-up with vascular surgeon Dr. Rachelle Hora in about 6 weeks to discuss possible removal of your IVC filter  Admission Diagnosis  DVT (deep venous thrombosis) ([place]) [I82.409]  Discharge Diagnosis  DVT (deep venous thrombosis) ([place]) [I82.409]   Principal Problem:   Hematoma of right thigh Active Problems:   DVT (deep venous thrombosis) (HCC)   CKD (chronic kidney disease) stage 2, GFR 60-89 ml/min   Essential hypertension   Acute blood loss anemia      Past Medical History:  Diagnosis Date  . Anemia   . Anxiety    pt denies  . Arthritis    KNEES AND BACK  . Bladder incontinence   . Cancer ([place])    basal cell cancer on nose  . Edema of both feet   . Hypertension   . Pneumonia   . PONV (postoperative nausea and vomiting)    PONV X 1 EPISODE, COMES OUT OF ANESTHESIA FAST, SOME AWARENESS DURING END OF COLONSCOPY  . Scoliosis     Past Surgical History:  Procedure Laterality Date  . ABDOMINAL HYSTERECTOMY     2006 VAG HYST  . BACK SURGERY     LOWER, X2  . BIOPSY Right 05/02/2017   Procedure: BIOPSY OF RIGHT UPPER EYELID LESION;  Surgeon: [name], MD;  Location: [place];  Service: Ophthalmology;  Laterality: Right;  . EYE SURGERY Bilateral     cataract surgery with lens implant  . JOINT REPLACEMENT     2011 LT HIP  . RECONSTRUCTION OF EYELID Right 05/02/2017   Procedure: TOTAL RECONSTRUCTION OF UPPER EYELID RIGHT EYE WITH FULL THICKNESS SKIN GRAFT FROM RIGHT POSTERIOR EAR;  Surgeon: [name], MD;  Location: [place];  Service: Ophthalmology;  Laterality: Right;  . TONSILLECTOMY    . TOTAL KNEE ARTHROPLASTY Right 04/27/2014   Procedure: [place] TOTAL KNEE ARTHROPLASTY;  Surgeon: Rod Can, MD;  [facility];  Service: Orthopedics;  Laterality: Right;  . TUBAL LIGATION     1974  . VENA CAVA FILTER PLACEMENT N/A 06/04/2017   Procedure: INSERTION VENA-CAVA FILTER;  Surgeon: Angelia Mould, MD;  Location: [facility];  Service: Vascular;  Laterality: N/A;     HPI  from the history and physical done on the day of admission:   Patient coming from: Patient transferred from [facility].  Chief Complaint: Right thigh hematoma.  HPI: Regina Hall is a 83 y.o. female with history of hypertension, chronic kidney disease  stage II who was recently diagnosed with left lower extremity DVT and was planned to have outpatient oral anticoagulant was referred to the hospital since patient was not able to obtain it.  Patient was admitted to [facility] and was started on heparin and Coumadin bridging.  Patient subsequently started developing pain in the right thigh and a CAT scan done showed 8 x 7 x 5 cm hematoma of the right thigh.  Patient may require IVC filter and transferred to [facility].  On my exam patient states her right thigh pain is improved with morphine injection.  There is a large ecchymotic area on the right thigh.  Patient is able to move her knee and hip area.  Denies any chest pain or shortness of breath.    Hospital Course:    Brief Summary:- 83 y.o.femalewithhistory of hypertension, chronic kidney disease stage II who was recently diagnosed with left lower extremity DVT, Patient was admitted to  [facility] and was started on heparin and Coumadin bridging, patient readmitted to [facility] 06/04/2017 with finding of right thigh hematoma/8 x 7 x 5 cm hematoma of the right thigh. She underwent IVC filter placement on 06/04/2017   Plan:-  1) Recent DVT  of left common femoral vein-  patient states right thigh discomfort is better, she developed Rt Thigh hematoma shortly after starting anticoagulation,  vascular surgery consulted  S/p  IVC filter placement on 06/04/17, patient may not be a good candidate for long-term anticoagulation given that she had a spontaneous hematoma in her right thigh fairly quickly after being started on Coumadin. Follow -up in 6 weeks with vascular surgeon Dr. Rachelle Hora to discuss possibly leaving IVC in place or removing it at that time . if she can safely be restarted on anticoagulation once she is at lower risk for bleeding complications and certainly we could consider filter removal   2)Acute blood loss anemia --- due to right thigh hematoma in the setting of anticoagulation, hemoglobin was around 12, now 8.6, overall hemoglobin is currently stable,,   repeat CBC on Monday, 06/10/2017,  continue iron supplements  3)Hypertension-stable, stop Maxzide, continue Losartan, may use HCTZ 12.5 mg daily only    4)Chronic kidney disease stage II-creatinine appears to be at baseline..  DVT prophylaxis:No DVT prophylaxis since patient has DVT and also hematoma. Code Status:Full code. Family Communication:Discussed with patient. Disposition Plan: SNF, PASSR received  Code Status : Full  Consults  : Vascular surgeon Dr. Rachelle Hora  Discharge Condition: Stable,  Follow UP   Contact information for follow-up providers    Angelia Mould, MD In 6 weeks.   Specialties:  Vascular Surgery, Cardiology Why:  Office will call you to arrange your appt (sent) Contact information: [place] [address] [phone]             Contact information for after-discharge care    [facility] .   Service:  Skilled Nursing Contact information: [address] [phone]                  Diet and Activity recommendation:  As advised  Discharge Instructions     Discharge Instructions    ([place]) Call MD:  Anytime you have any of the following symptoms: 1) 3 pound weight gain in 24 hours or 5 pounds in 1 week 2) shortness of breath, with or without a dry hacking cough 3) swelling in the hands,  feet or stomach 4) if you have to sleep on extra pillows at night in order to breathe.   Complete by:  As directed    Call MD for:  difficulty breathing, headache or visual disturbances   Complete by:  As directed    Call MD for:  persistant dizziness or light-headedness   Complete by:  As directed    Call MD for:  persistant nausea and vomiting   Complete by:  As directed    Call MD for:  redness, tenderness, or signs of infection (pain, swelling, redness, odor or green/yellow discharge around incision site)   Complete by:  As directed    Call MD for:  severe uncontrolled pain   Complete by:  As directed    Call MD for:  temperature >100.4   Complete by:  As directed    Diet - low sodium heart healthy   Complete by:  As directed    Discharge instructions   Complete by:  As directed    1)Low-salt diet advised 2)Avoid ibuprofen/Advil/Aleve/Motrin/Goody Powders/Naproxen/BC powders/Meloxicam/Diclofenac/Indomethacin and other Nonsteroidal anti-inflammatory medications as these will make you more likely to bleed and can cause stomach ulcers, can also cause Kidney problems.  3)Repeat CBC/complete blood count and BMP/basic metabolic profile on Monday, 06/10/2017 4) follow-up with vascular surgeon Dr. Rachelle Hora in about 6 weeks to discuss possible removal of your IVC filter   Increase activity slowly   Complete by:  As directed          Discharge Medications     Allergies as of 06/07/2017      Reactions   Penicillins Rash, Other (See Comments)   Has patient had a PCN reaction causing immediate rash, facial/tongue/throat swelling, SOB or lightheadedness with hypotension: ###Yes## Has patient had a PCN reaction causing severe rash involving mucus membranes or skin necrosis: No Has patient had a PCN reaction that required hospitalization No Has patient had a PCN reaction occurring within the last 10 years: No If all of the above answers are "NO", then may proceed with Cephalosporin use.      Medication List    STOP taking these medications   triamterene-hydrochlorothiazide 37.5-25 MG capsule Commonly known as:  DYAZIDE     TAKE these medications   acetaminophen 325 MG tablet Commonly known as:  TYLENOL Take 2 tablets (650 mg total) by mouth every 6 (six) hours as needed for mild pain (or Fever >/= 101).   ARTIFICIAL TEARS OP Place 2 drops into both eyes daily as needed (for dry eyes).   Biotin 10 MG Tabs Take 10 mg by mouth every morning.   CO Q 10 PO Take 1 capsule by mouth daily.   ferrous sulfate 325 (65 FE) MG tablet Take 1 tablet (325 mg total) by mouth 2 (two) times daily with a meal.   gabapentin 100 MG capsule Commonly known as:  NEURONTIN Take 100 mg by mouth at bedtime.   GLUCOSAMINE SULFATE PO Take 2 tablets by mouth daily.   hydrochlorothiazide 12.5 MG tablet Commonly known as:  HYDRODIURIL Take 1 tablet (12.5 mg total) by mouth daily.   HYDROcodone-acetaminophen 10-325 MG tablet Commonly known as:  NORCO Take 1 tablet by mouth every 6 (six) hours as needed for moderate pain or severe pain. What changed:    how much to take  when to take this  reasons to take this   losartan 25 MG tablet Commonly known as:  COZAAR Take 1 tablet (25 mg total) by mouth every morning.  methocarbamol 500 MG tablet Commonly known as:  ROBAXIN Take 1 tablet (500 mg total) by mouth 3 (three) times  daily.   polyethylene glycol packet Commonly known as:  MIRALAX / GLYCOLAX Take 17 g by mouth daily. Start taking on:  06/08/2017   senna-docusate 8.6-50 MG tablet Commonly known as:  Senokot-S Take 2 tablets by mouth at bedtime.   Vitamin D3 5000 units Tabs Take 5,000 Units by mouth daily.       Major procedures and Radiology Reports - PLEASE review detailed and final reports for all details, in brief -    No results found.  Micro Results   No results found for this or any previous visit (from the past 240 hour(s)).     Today   Subjective    Nola Botkins today has no new complaints, hoping to have a bowel movement today, no dizziness no shortness of breath no palpitations          Patient has been seen and examined prior to discharge   Objective   Blood pressure (!) 152/68, pulse 86, temperature 99.1 F (37.3 C), temperature source Oral, resp. rate 20, height 5\' 5"  (1.651 m), weight 105.6 kg (232 lb 12.9 oz), SpO2 95 %.   Intake/Output Summary (Last 24 hours) at 06/07/2017 1140 Last data filed at 06/07/2017 0900 Gross per 24 hour  Intake 840 ml  Output 3100 ml  Net -2260 ml    Exam Gen:- Awake Alert,  In no apparent distress  HEENT:- [place].AT, No sclera icterus Neck-Supple Neck,No JVD,.  Lungs-  CTAB , good air movement CV- S1, S2 normal Abd-  +ve B.Sounds, Abd Soft, No tenderness,    Extremity/Skin:-Right thigh hematoma and bruising appears to be resolving,, tender to palpation, patient also has some bruising over the left upper medial thigh area.  Pedal pulses are good Psych-affect is appropriate, oriented x3 Neuro-no new focal deficits, no tremors    Data Review   CBC w Diff:  Lab Results  Component Value Date   WBC 9.6 06/07/2017   HGB 8.6 (L) 06/07/2017   HCT 26.5 (L) 06/07/2017   PLT 173 06/07/2017   LYMPHOPCT 22 06/04/2017   MONOPCT 13 06/04/2017   EOSPCT 5 06/04/2017   BASOPCT 0 06/04/2017    CMP:  Lab Results  Component Value Date     NA 142 06/06/2017   K 4.4 06/06/2017   CL 107 06/06/2017   CO2 27 06/06/2017   BUN 30 (H) 06/06/2017   CREATININE 1.03 (H) 06/06/2017   PROT 5.0 (L) 06/05/2017   ALBUMIN 2.7 (L) 06/05/2017   BILITOT 0.7 06/05/2017   ALKPHOS 49 06/05/2017   AST 21 06/05/2017   ALT 11 (L) 06/05/2017    Total Discharge time is about 33 minutes  Roxan Hockey M.D on 06/07/2017 at 11:40 AM  [facility]   Office  [phone]  Voice Recognition Viviann Spare dictation system was used to create this note, attempts have been made to correct errors. Please contact the author with questions and/or clarifications.

## 2017-06-07 NOTE — Clinical Social Work Placement (Signed)
   CLINICAL SOCIAL WORK PLACEMENT  NOTE  Date:  06/07/2017  Patient Details  Name: PAMMY VESEY MRN: [id number] Date of Birth: 06/15/1934  Clinical Social Work is seeking post-discharge placement for this patient at the [place] level of care (*CSW will initial, date and re-position this form in  chart as items are completed):  Yes   Patient/family provided with [place] Work Department's list of facilities offering this level of care within the geographic area requested by the patient (or if unable, by the patient's family).  Yes   Patient/family informed of their freedom to choose among providers that offer the needed level of care, that participate in Medicare, Medicaid or managed care program needed by the patient, have an available bed and are willing to accept the patient.  Yes   Patient/family informed of [facility]'s ownership interest in [facility] and [facility], as well as of the fact that they are under no obligation to receive care at these facilities.  PASRR submitted to EDS on 06/06/17     PASRR number received on 06/07/17     Existing PASRR number confirmed on       FL2 transmitted to all facilities in geographic area requested by pt/family on 06/06/17     FL2 transmitted to all facilities within larger geographic area on       Patient informed that his/her managed care company has contracts with or will negotiate with certain facilities, including the following:  [facility]     Yes   Patient/family informed of bed offers received.  Patient chooses bed at [facility]     Physician recommends and patient chooses bed at      Patient to be transferred to [facility] on 06/07/17.  Patient to be transferred to facility by [facility]     Patient family notified on 06/07/17 of transfer.  Name of family member notified:        PHYSICIAN Please prepare priority discharge summary, including medications,  Please prepare prescriptions, Please sign FL2     Additional Comment:    _______________________________________________ Estanislado Emms, LCSW 06/07/2017, 10:17 AM

## 2017-06-26 ENCOUNTER — Telehealth: Payer: Self-pay | Admitting: *Deleted

## 2017-06-26 NOTE — Telephone Encounter (Signed)
Patient looking for family physician that is in the [facility] system.    Informed patient that she may try [facility] (Dr. Caren Macadam or Dr. Tula Nakayama).  Also, may go on [facility] website & search for providers which will list a lot more locations.    She verbalized understanding & was appreciative of the call.

## 2017-07-21 ENCOUNTER — Encounter (HOSPITAL_COMMUNITY): Payer: Self-pay | Admitting: Emergency Medicine

## 2017-07-21 ENCOUNTER — Other Ambulatory Visit: Payer: Self-pay

## 2017-07-21 ENCOUNTER — Emergency Department (HOSPITAL_COMMUNITY)
Admission: EM | Admit: 2017-07-21 | Discharge: 2017-07-21 | Disposition: A | Discharge status: Home / Self Care | Payer: Medicare Other | Attending: Emergency Medicine | Admitting: Emergency Medicine

## 2017-07-21 DIAGNOSIS — M5416 Radiculopathy, lumbar region: Secondary | ICD-10-CM | POA: Insufficient documentation

## 2017-07-21 DIAGNOSIS — I129 Hypertensive chronic kidney disease with stage 1 through stage 4 chronic kidney disease, or unspecified chronic kidney disease: Secondary | ICD-10-CM | POA: Diagnosis not present

## 2017-07-21 DIAGNOSIS — M7981 Nontraumatic hematoma of soft tissue: Secondary | ICD-10-CM | POA: Diagnosis present

## 2017-07-21 DIAGNOSIS — N182 Chronic kidney disease, stage 2 (mild): Secondary | ICD-10-CM | POA: Insufficient documentation

## 2017-07-21 DIAGNOSIS — Z7901 Long term (current) use of anticoagulants: Secondary | ICD-10-CM | POA: Insufficient documentation

## 2017-07-21 DIAGNOSIS — M7989 Other specified soft tissue disorders: Secondary | ICD-10-CM

## 2017-07-21 DIAGNOSIS — M79605 Pain in left leg: Secondary | ICD-10-CM

## 2017-07-21 DIAGNOSIS — Z85828 Personal history of other malignant neoplasm of skin: Secondary | ICD-10-CM | POA: Diagnosis not present

## 2017-07-21 DIAGNOSIS — Z96642 Presence of left artificial hip joint: Secondary | ICD-10-CM | POA: Diagnosis not present

## 2017-07-21 DIAGNOSIS — Z79899 Other long term (current) drug therapy: Secondary | ICD-10-CM | POA: Insufficient documentation

## 2017-07-21 DIAGNOSIS — Z96651 Presence of right artificial knee joint: Secondary | ICD-10-CM | POA: Diagnosis not present

## 2017-07-21 NOTE — Discharge Instructions (Signed)
RN will give you information about your tests to look for a blood clot in the left leg.

## 2017-07-21 NOTE — ED Triage Notes (Signed)
Pt reports LT leg pain that begins in the lower back and then radiates down into leg. States pain has been worsening since May 15th to the point where she cannot ambulate.

## 2017-07-21 NOTE — ED Notes (Signed)
EDP at bedside  

## 2017-07-21 NOTE — ED Provider Notes (Signed)
[facility] EMERGENCY DEPARTMENT Provider Note   CSN: [id number] Arrival date & time: 07/21/17  1541     History   Chief Complaint Chief Complaint  Patient presents with  . Leg Pain    HPI Regina Hall is a 83 y.o. female.  Level 5 caveat for mild dementia.  Patient is most concerned about pain and swelling in her left leg and the possibility of a blood clot.  She has had chronic low back pain for years resulting in "3 different surgeries" with associated radicular pain down the left leg.  However, she now feels the left thigh is more tender and puffy than usual.  She was recently diagnosed with a DVT in her left common femoral vein in May 2019.  She was placed on Coumadin which resulted in a hematoma of her right medial thigh.  Coumadin was discontinued.  An IVC filter was placed on 06/04/2017.  She takes hydrocodone for pain (which does not help much).     Past Medical History:  Diagnosis Date  . Anemia   . Anxiety    pt denies  . Arthritis    KNEES AND BACK  . Bladder incontinence   . Cancer ([place])    basal cell cancer on nose  . Edema of both feet   . Hypertension   . Pneumonia   . PONV (postoperative nausea and vomiting)    PONV X 1 EPISODE, COMES OUT OF ANESTHESIA FAST, SOME AWARENESS DURING END OF COLONSCOPY  . Scoliosis     Patient Active Problem List   Diagnosis Date Noted  . DVT (deep venous thrombosis) ([place]) 06/04/2017  . Hematoma of right thigh 06/04/2017  . CKD (chronic kidney disease) stage 2, GFR 60-89 ml/min 06/04/2017  . Essential hypertension 06/04/2017  . Acute blood loss anemia 06/04/2017  . Scoliosis of lumbar spine 03/28/2015  . Primary osteoarthritis of right knee 04/27/2014    Past Surgical History:  Procedure Laterality Date  . ABDOMINAL HYSTERECTOMY     2006 VAG HYST  . BACK SURGERY     LOWER, X2  . BIOPSY Right 05/02/2017   Procedure: BIOPSY OF RIGHT UPPER EYELID LESION;  Surgeon: [name], MD;  Location: [place];  Service:  Ophthalmology;  Laterality: Right;  . EYE SURGERY Bilateral    cataract surgery with lens implant  . JOINT REPLACEMENT     2011 LT HIP  . RECONSTRUCTION OF EYELID Right 05/02/2017   Procedure: TOTAL RECONSTRUCTION OF UPPER EYELID RIGHT EYE WITH FULL THICKNESS SKIN GRAFT FROM RIGHT POSTERIOR EAR;  Surgeon: [name], MD;  Location: [place];  Service: Ophthalmology;  Laterality: Right;  . TONSILLECTOMY    . TOTAL KNEE ARTHROPLASTY Right 04/27/2014   Procedure: [place] TOTAL KNEE ARTHROPLASTY;  Surgeon: Rod Can, MD;  [facility];  Service: Orthopedics;  Laterality: Right;  . TUBAL LIGATION     1974  . VENA CAVA FILTER PLACEMENT N/A 06/04/2017   Procedure: INSERTION VENA-CAVA FILTER;  Surgeon: Angelia Mould, MD;  Location: [facility];  Service: Vascular;  Laterality: N/A;     OB History   None      Home Medications    Prior to Admission medications   Medication Sig Start Date End Date Taking? Authorizing Provider  acetaminophen (TYLENOL) 325 MG tablet Take 2 tablets (650 mg total) by mouth every 6 (six) hours as needed for mild pain (or Fever >/= 101). 06/07/17  Yes Roxan Hockey, MD  Cholecalciferol (VITAMIN D3) 5000  units TABS Take 5,000 Units by mouth daily.   Yes [provider]  Coenzyme Q10 (CO Q 10 PO) Take 1 capsule by mouth daily.   Yes [provider]  ferrous sulfate 325 (65 FE) MG tablet Take 1 tablet (325 mg total) by mouth 2 (two) times daily with a meal. 06/07/17  Yes Emokpae, Courage, MD  GLUCOSAMINE SULFATE PO Take 2 tablets by mouth daily.    Yes [provider]  HYDROcodone-acetaminophen (NORCO) 10-325 MG tablet Take 1 tablet by mouth every 6 (six) hours as needed for moderate pain or severe pain. 06/07/17  Yes Emokpae, Courage, MD  Hypromellose (ARTIFICIAL TEARS OP) Place 2 drops into both eyes daily as needed (for dry eyes).   Yes [provider]  losartan (COZAAR) 25 MG tablet Take 1 tablet (25 mg total) by mouth  every morning. 06/07/17  Yes Emokpae, Courage, MD  montelukast (SINGULAIR) 10 MG tablet Take 10 mg by mouth every morning. 07/06/17  Yes [provider]  senna-docusate (SENOKOT-S) 8.6-50 MG tablet Take 2 tablets by mouth at bedtime. Patient taking differently: Take 2 tablets by mouth at bedtime as needed for mild constipation or moderate constipation.  06/07/17 06/07/18 Yes Emokpae, Courage, MD  triamterene-hydrochlorothiazide (MAXZIDE-25) 37.5-25 MG tablet Take 1 tablet by mouth daily.   Yes [provider]  hydrochlorothiazide (HYDRODIURIL) 12.5 MG tablet Take 1 tablet (12.5 mg total) by mouth daily. Patient not taking: Reported on 07/21/2017 06/07/17   Roxan Hockey, MD  methocarbamol (ROBAXIN) 500 MG tablet Take 1 tablet (500 mg total) by mouth 3 (three) times daily. Patient not taking: Reported on 07/21/2017 06/07/17   Roxan Hockey, MD  polyethylene glycol (MIRALAX / GLYCOLAX) packet Take 17 g by mouth daily. Patient not taking: Reported on 07/21/2017 06/08/17   Roxan Hockey, MD    Family History Family History  Problem Relation Age of Onset  . Congestive Heart Failure Mother   . Colon cancer Father   . Cancer Brother     Social History Social History   Tobacco Use  . Smoking status: Never Smoker  . Smokeless tobacco: Never Used  Substance Use Topics  . Alcohol use: Yes    Comment: OCC WINE  . Drug use: No     Allergies   Penicillins   Review of Systems Review of Systems  Unable to perform ROS: Other (mild dementia)     Physical Exam Updated Vital Signs BP (!) 166/89 (BP Location: Left Arm)   Pulse 88   Temp 97.7 F (36.5 C) (Oral)   Resp 18   Ht 5\' 8"  (1.727 m)   Wt 95.3 kg (210 lb)   SpO2 95%   BMI 31.93 kg/m   Physical Exam  Constitutional: She is oriented to person, place, and time. She appears well-developed and well-nourished.  HENT:  Head: Normocephalic and atraumatic.  Eyes: Conjunctivae are normal.  Neck: Neck supple.    Cardiovascular: Normal rate and regular rhythm.  Pulmonary/Chest: Effort normal and breath sounds normal.  Abdominal: Soft. Bowel sounds are normal.  Musculoskeletal:  Right lower extremity: Palpable hematoma medial aspect of thigh (old).  Left lower extremity: Minimal generalized tenderness in the anterior and posterior thigh  Neurological: She is alert and oriented to person, place, and time.  Skin: Skin is warm and dry.  Psychiatric: She has a normal mood and affect. Her behavior is normal.  Nursing note and vitals reviewed.    ED Treatments / Results  Labs (all labs ordered are  listed, but only abnormal results are displayed) Labs Reviewed - No data to display  EKG None  Radiology No results found.  Procedures Procedures (including critical care time)  Medications Ordered in ED Medications - No data to display   Initial Impression / Assessment and Plan / ED Course  I have reviewed the triage vital signs and the nursing notes.  Pertinent labs & imaging results that were available during my care of the patient were reviewed by me and considered in my medical decision making (see chart for details).     Patient is most concerned about a DVT in her left lower extremity.  She developed a hematoma secondary to anticoagulation recently.  Therefore, will not give Lovenox today.  I have ordered an ultrasound of her left lower extremity for Monday, 07/22/2017.  Final Clinical Impressions(s) / ED Diagnoses   Final diagnoses:  Left leg swelling    ED Discharge Orders        Ordered    US Venous Img Lower Unilateral Left     07/21/17 1800       Nat Christen, MD 07/21/17 [phone]

## 2017-07-22 ENCOUNTER — Ambulatory Visit (HOSPITAL_COMMUNITY)
Admission: RE | Admit: 2017-07-22 | Discharge: 2017-07-22 | Disposition: A | Discharge status: Home / Self Care | Payer: Medicare Other | Source: Ambulatory Visit | Attending: Emergency Medicine | Admitting: Emergency Medicine

## 2017-07-22 DIAGNOSIS — I82412 Acute embolism and thrombosis of left femoral vein: Secondary | ICD-10-CM | POA: Diagnosis not present

## 2017-07-22 DIAGNOSIS — M79605 Pain in left leg: Secondary | ICD-10-CM | POA: Diagnosis not present

## 2017-07-22 NOTE — ED Provider Notes (Signed)
Pt presents to the ED today as a return visit for outpatient [place].  She was initially seen by Dr. Scot Dock on 5/15, had an [place] at his office which showed:   DVT LEFT LOWER EXTREMITY: This patient has a new finding of a DVT involving the left common femoral vein and femoral vein.  This was not seen on her previous duplex when she was seen 2 years ago.  It is difficult to determine the chronicity of this.  Regardless I have recommended that she start Xarelto and I have ordered a follow-up duplex scan in 6 months.  I have sent her prescription for a starter pack for Xarelto to her pharmacist in [place].  We have also discussed the importance of leg elevation.  I will see her back in 3 months for a follow-up duplex scan.  Pt given rx for Xarelto, but she was unable to afford, so she was admitted for coumadin and heparin as she was also unable to afford lovenox.  She developed a right thigh hematoma, so was taken off those meds and a IVC filter was placed on 5/21 by Dr. Scot Dock.  The pt came in last night c/o left leg pain so they ordered an outpatient [place].    This showed:  Examination is positive for extensive predominantly occlusive DVT extending from the left common femoral vein through (at least) the imaged distal aspect of the left femoral vein.  Note, the popliteal vein as well as the tibial veins are poorly visualized due to patient body habitus and poor sonographic window, however additional DVT involving these vessels is not excluded on the basis this examination.  Pt d/w Dr. Donnetta Hutching who agrees with no anticoagulation for now since she has the filter.  She is instructed to keep appt tomorrow with Dr. Scot Dock.  Pt c/o pain, so she was given rx for percocet 5/325 #10 on a written paper prescription.     Isla Pence, MD 07/22/17 1431

## 2017-07-24 ENCOUNTER — Encounter: Payer: Self-pay | Admitting: Vascular Surgery

## 2017-07-24 ENCOUNTER — Other Ambulatory Visit: Payer: Self-pay

## 2017-07-24 ENCOUNTER — Ambulatory Visit (INDEPENDENT_AMBULATORY_CARE_PROVIDER_SITE_OTHER): Payer: Medicare Other | Admitting: Vascular Surgery

## 2017-07-24 VITALS — BP 145/78 | HR 76 | Temp 97.2°F | Resp 20 | Ht 68.0 in | Wt 210.0 lb

## 2017-07-24 DIAGNOSIS — Z48812 Encounter for surgical aftercare following surgery on the circulatory system: Secondary | ICD-10-CM

## 2017-07-24 NOTE — Progress Notes (Signed)
Patient name: Regina Hall MRN: [id number] DOB: 1934/03/19 Sex: female  REASON FOR VISIT:   Follow-up of IVC filter.  HPI:   Regina Hall is a pleasant 83 y.o. female right seen in consultation in the hospital.  She had presented with a left lower extremity DVT.  She was started on Coumadin developed significant right thigh pain and a CT scan found a large hematoma.  Given the contraindication to heparin and anticoagulation and IVC filter was placed.  The situation was complicated by the fact that the patient is not a good candidate for long-term anticoagulation given that she has had a spontaneous hematoma in the right thigh fairly quickly after starting Coumadin.  This reason I was thinking it might be best to leave her filter.  She comes in for a 6-week follow-up visit.  Since I saw her last, she continues to have some pain in the left leg which is aggravated by sitting and standing.  She has been elevating her leg but has some hip pain when she elevates her leg which she is dealing with.  She has an appointment to see someone about this.  I think she spends a good amount of time sitting up in the chair which is certainly making the swelling worse.  She is not on anticoagulation as she is felt to be high risk for bleeding given her previous problems.  She denies any chest pain, pleuritic chest pain, or shortness of breath.  Current Outpatient Medications  Medication Sig Dispense Refill  . Cholecalciferol (VITAMIN D3) 5000 units TABS Take 5,000 Units by mouth daily.    . Coenzyme Q10 (CO Q 10 PO) Take 1 capsule by mouth daily.    . ferrous sulfate 325 (65 FE) MG tablet Take 1 tablet (325 mg total) by mouth 2 (two) times daily with a meal. 60 tablet 3  . GLUCOSAMINE SULFATE PO Take 2 tablets by mouth daily.     . Hypromellose (ARTIFICIAL TEARS OP) Place 2 drops into both eyes daily as needed (for dry eyes).    Marland Kitchen losartan (COZAAR) 25 MG tablet Take 1 tablet (25 mg total) by mouth every  morning. 30 tablet 2  . methocarbamol (ROBAXIN) 500 MG tablet Take 1 tablet (500 mg total) by mouth 3 (three) times daily. 90 tablet 1  . montelukast (SINGULAIR) 10 MG tablet Take 10 mg by mouth every morning.  0  . oxyCODONE-acetaminophen (PERCOCET/ROXICET) 5-325 MG tablet TK 1 T PO Q 4 HOURS PRF PAIN  0  . triamterene-hydrochlorothiazide (MAXZIDE-25) 37.5-25 MG tablet Take 1 tablet by mouth daily.     No current facility-administered medications for this visit.    Facility-Administered Medications Ordered in Other Visits  Medication Dose Route Frequency Provider Last Rate Last Dose  . ondansetron (ZOFRAN) 4 mg in sodium chloride 0.9 % 50 mL IVPB  4 mg Intravenous Once Rod Can, MD        REVIEW OF SYSTEMS:  [X]  denotes positive finding, [ ]  denotes negative finding Cardiac  Comments:  Chest pain or chest pressure:    Shortness of breath upon exertion:    Short of breath when lying flat:    Irregular heart rhythm:    Constitutional    Fever or chills:     PHYSICAL EXAM:   Vitals:   07/24/17 0931  BP: (!) 145/78  Pulse: 76  Resp: 20  Temp: (!) 97.2 F (36.2 C)  TempSrc: Oral  SpO2: 97%  Weight: 210 lb (95.3  kg)  Height: 5\' 8"  (1.727 m)    GENERAL: The patient is a well-nourished female, in no acute distress. The vital signs are documented above. CARDIOVASCULAR: There is a regular rate and rhythm. PULMONARY: There is good air exchange bilaterally without wheezing or rales. VASCULAR: I do not detect carotid bruits. She has significant bilateral lower extremity swelling which is more significant on the left side. She has no ulcers or significant hyperpigmentation.  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  There is poor visualization of the calf.  Her previous venous duplex scan was in May prior to placement of an IVC filter which showed clot in  the common femoral vein and femoral vein.  MEDICAL ISSUES:   STATUS POST PLACEMENT OF IVC FILTER: Given that the patient is felt to be at high risk for anticoagulation and is not currently on anticoagulation I would not favor removing the filter at this time.  She is in the process of getting a new primary care physician I believe and so I am not sure who her primary doctor is at this point.  I think consideration could be given to low-dose Xarelto (2.5 mg twice daily).  If she was able to get on anticoagulation then we could consider removing the filter in the future although have explained that certainly gets harder the longer we wait.  However currently all things considered I would not favor removing the filter at this time.  I have ordered a follow-up visit in 6 months and a plain abdominal x-ray at that time to check on her filter.  She is to call sooner if she has problems.  I have encouraged her to continue to elevate her legs and avoid prolonged sitting.  She also has compression stockings.  Deitra Mayo [facility] [phone]

## 2017-07-29 ENCOUNTER — Other Ambulatory Visit: Payer: Self-pay

## 2017-07-29 DIAGNOSIS — Z95828 Presence of other vascular implants and grafts: Secondary | ICD-10-CM

## 2017-07-31 ENCOUNTER — Encounter: Payer: Self-pay | Admitting: Vascular Surgery

## 2017-08-12 ENCOUNTER — Encounter: Payer: Self-pay | Admitting: Vascular Surgery

## 2017-08-12 ENCOUNTER — Other Ambulatory Visit: Payer: Self-pay

## 2017-08-12 DIAGNOSIS — I82402 Acute embolism and thrombosis of unspecified deep veins of left lower extremity: Secondary | ICD-10-CM

## 2017-09-03 ENCOUNTER — Telehealth: Payer: Self-pay

## 2017-09-03 ENCOUNTER — Other Ambulatory Visit: Payer: Self-pay

## 2017-09-03 DIAGNOSIS — Z95828 Presence of other vascular implants and grafts: Secondary | ICD-10-CM

## 2017-09-03 NOTE — Telephone Encounter (Signed)
Called patient to inform her that her appt tomorrow for vascular lab is cancelled. Per Dr. Nicole Cella note from 07/24/17 she will follow up 6 months from that date to see him with a plain abdominal x-ray at that time to check filter placement. I informed pt she has an appt on 01/29/18 at 9:45 with Dr. Scot Dock. She requested her x-ray be closer to home so we will schedule at [facility] and call her with day and time. Pt stated she was still having problem with her feet swelling. Instructed patient to keep her feet elevated above heart when sitting. Call as needed.

## 2017-09-04 ENCOUNTER — Ambulatory Visit: Payer: Medicare Other | Admitting: Vascular Surgery

## 2017-09-04 ENCOUNTER — Encounter (HOSPITAL_COMMUNITY): Payer: Medicare Other

## 2017-12-09 ENCOUNTER — Ambulatory Visit (INDEPENDENT_AMBULATORY_CARE_PROVIDER_SITE_OTHER): Payer: Medicare Other | Admitting: Pediatrics

## 2017-12-09 ENCOUNTER — Encounter: Payer: Self-pay | Admitting: Pediatrics

## 2017-12-09 VITALS — BP 134/75 | HR 79 | Temp 98.2°F | Ht 68.0 in | Wt 221.6 lb

## 2017-12-09 DIAGNOSIS — M7989 Other specified soft tissue disorders: Secondary | ICD-10-CM | POA: Diagnosis not present

## 2017-12-09 DIAGNOSIS — G8929 Other chronic pain: Secondary | ICD-10-CM

## 2017-12-09 DIAGNOSIS — I10 Essential (primary) hypertension: Secondary | ICD-10-CM

## 2017-12-09 DIAGNOSIS — D649 Anemia, unspecified: Secondary | ICD-10-CM

## 2017-12-09 DIAGNOSIS — I824Y2 Acute embolism and thrombosis of unspecified deep veins of left proximal lower extremity: Secondary | ICD-10-CM | POA: Diagnosis not present

## 2017-12-09 DIAGNOSIS — M5137 Other intervertebral disc degeneration, lumbosacral region: Secondary | ICD-10-CM | POA: Insufficient documentation

## 2017-12-09 DIAGNOSIS — R7301 Impaired fasting glucose: Secondary | ICD-10-CM | POA: Insufficient documentation

## 2017-12-09 DIAGNOSIS — R2689 Other abnormalities of gait and mobility: Secondary | ICD-10-CM

## 2017-12-09 DIAGNOSIS — Z95828 Presence of other vascular implants and grafts: Secondary | ICD-10-CM

## 2017-12-09 DIAGNOSIS — M5442 Lumbago with sciatica, left side: Secondary | ICD-10-CM

## 2017-12-09 LAB — CMP14+EGFR
ALK PHOS: 79 IU/L (ref 39–117)
ALT: 12 IU/L (ref 0–32)
AST: 13 IU/L (ref 0–40)
Albumin/Globulin Ratio: 1.8 (ref 1.2–2.2)
Albumin: 4 g/dL (ref 3.5–4.7)
BILIRUBIN TOTAL: 0.5 mg/dL (ref 0.0–1.2)
BUN/Creatinine Ratio: 21 (ref 12–28)
BUN: 24 mg/dL (ref 8–27)
CHLORIDE: 102 mmol/L (ref 96–106)
CO2: 25 mmol/L (ref 20–29)
CREATININE: 1.12 mg/dL — AB (ref 0.57–1.00)
Calcium: 9.9 mg/dL (ref 8.7–10.3)
GFR calc Af Amer: 54 mL/min/{1.73_m2} — ABNORMAL LOW (ref >59)
GFR calc non Af Amer: 47 mL/min/{1.73_m2} — ABNORMAL LOW (ref >59)
GLUCOSE: 102 mg/dL — AB (ref 65–99)
Globulin, Total: 2.2 g/dL (ref 1.5–4.5)
Potassium: 4.1 mmol/L (ref 3.5–5.2)
Sodium: 146 mmol/L — ABNORMAL HIGH (ref 134–144)
Total Protein: 6.2 g/dL (ref 6.0–8.5)

## 2017-12-09 LAB — CBC WITH DIFFERENTIAL/PLATELET
BASOS ABS: 0.1 10*3/uL (ref 0.0–0.2)
Basos: 1 %
EOS (ABSOLUTE): 0.2 10*3/uL (ref 0.0–0.4)
Eos: 3 %
HEMOGLOBIN: 13.4 g/dL (ref 11.1–15.9)
Hematocrit: 39.5 % (ref 34.0–46.6)
Immature Grans (Abs): 0 10*3/uL (ref 0.0–0.1)
Immature Granulocytes: 0 %
LYMPHS ABS: 1.8 10*3/uL (ref 0.7–3.1)
LYMPHS: 27 %
MCH: 30.7 pg (ref 26.6–33.0)
MCHC: 33.9 g/dL (ref 31.5–35.7)
MCV: 91 fL (ref 79–97)
MONOCYTES: 11 %
Monocytes Absolute: 0.7 10*3/uL (ref 0.1–0.9)
NEUTROS ABS: 3.8 10*3/uL (ref 1.4–7.0)
NEUTROS PCT: 58 %
Platelets: 214 10*3/uL (ref 150–450)
RBC: 4.36 x10E6/uL (ref 3.77–5.28)
RDW: 13.3 % (ref 12.3–15.4)
WBC: 6.7 10*3/uL (ref 3.4–10.8)

## 2017-12-09 NOTE — Progress Notes (Signed)
Subjective:   Patient ID: Regina Hall, female    DOB: 13-May-1934, 83 y.o.   MRN: [id number] CC: Multiple medical problems HPI: Regina Hall is a 83 y.o. female   Recent hospitalization at [facility], was admitted for 4 to 5 days, discharged a week ago.  She was sent to hospital for admission because of increased swelling in legs noted by her wound doctor. She was started on Lasix, triamterene/HCTZ was stopped.  She does not regularly check her blood pressures at home but does have a blood pressure cuff at home.  She sees wound regularly for her lower leg swelling.  [facility] nurse comes from wound once a week to change the dressings on legs.  She says they have been improved over the last few weeks, no longer has weeping sores.  She is not sure if a new echo was done while in the hospital [place], last echo in our system was September 4098 with no diastolic dysfunction, normal systolic function.  Back pain: Minimally mobile due to back pain.  She can stand and walk around the house some using a rolling walker, has to lean a lot on the walker for support.  In a wheelchair today.  She was doing aqua therapy, now not able to because of needing to keep legs wrapped for the swelling.  She has had 3 back surgeries, was recommended to get a spinal cord stimulator most recently by pain management several months ago, does not want one so she has not followed up with pain management.  Physical therapist is going to be coming out later this week to assess her from the hospital stay.  HTN: Takes losartan regularly.  No lightheadedness or dizziness  History of DVT: Left femoral vein, diagnosed in May 2019.  Was started on Coumadin with Lovenox bridge, had spontaneous bleed in R leg, anticoagulation was stopped and IVC filter was placed.  She has an appointment in a few weeks with vascular surgery.  Per chart review, she is high risk for anticoagulation because of spontaneous bleed.  This was her first  blood clot.  CKD stage II: Has been seeing a nephrologist for the last few months.  Avoiding NSAIDs.  Appetite is been good.  Trying to minimize salt in diet.  She was anemic following the spontaneous bleed in her leg.  She has been taking ferrous sulfate daily for the last few months.  Has been causing some constipation.  Past Medical History:  Diagnosis Date  . Anemia   . Anxiety    pt denies  . Arthritis    KNEES AND BACK  . Bladder incontinence   . Cancer ([place])    basal cell cancer on nose  . Edema of both feet   . Hypertension   . Pneumonia   . PONV (postoperative nausea and vomiting)    PONV X 1 EPISODE, COMES OUT OF ANESTHESIA FAST, SOME AWARENESS DURING END OF COLONSCOPY  . Scoliosis    Family History  Problem Relation Age of Onset  . Congestive Heart Failure Mother   . Colon cancer Father   . Cancer Brother    Social History   Socioeconomic History  . Marital status: Married    Spouse name: Not on file  . Number of children: Not on file  . Years of education: Not on file  . Highest education level: Not on file  Occupational History  . Not on file  Social Needs  . Financial resource strain: Not on  file  . Food insecurity:    Worry: Not on file    Inability: Not on file  . Transportation needs:    Medical: Not on file    Non-medical: Not on file  Tobacco Use  . Smoking status: Never Smoker  . Smokeless tobacco: Never Used  Substance and Sexual Activity  . Alcohol use: Yes    Comment: OCC WINE  . Drug use: No  . Sexual activity: Not on file  Lifestyle  . Physical activity:    Days per week: Not on file    Minutes per session: Not on file  . Stress: Not on file  Relationships  . Social connections:    Talks on phone: Not on file    Gets together: Not on file    Attends religious service: Not on file    Active member of club or organization: Not on file    Attends meetings of clubs or organizations: Not on file    Relationship status: Not on  file  Other Topics Concern  . Not on file  Social History Narrative  . Not on file   ROS: All systems negative other than what is in HPI  Objective:    BP 134/75   Pulse 79   Temp 98.2 F (36.8 C) (Oral)   Ht '5\' 8"'  (1.727 m)   Wt 221 lb 9.6 oz (100.5 kg)   BMI 33.69 kg/m   Wt Readings from Last 3 Encounters:  12/09/17 221 lb 9.6 oz (100.5 kg)  07/24/17 210 lb (95.3 kg)  07/21/17 210 lb (95.3 kg)    Gen: NAD, alert, cooperative with exam, NCAT EYES: EOMI, no conjunctival injection, or no icterus CV: NRRR, normal S1/S2, no murmur, distal pulses 2+ b/l Resp: CTABL, no wheezes, normal WOB Abd: +BS, soft, NTND. no guarding or organomegaly Ext: Bilateral lower legs wrapped below the knee Ace bandages, dressings Neuro: Alert and oriented MSK: normal muscle bulk  Assessment & Plan:  [name] was seen today for new patient (initial visit).  Diagnoses and all orders for this visit:  Anemia, unspecified type Recheck labs.  No known bleeding now.  Okay to decrease ferrous sulfate intake to MWF. -     CBC with Differential/Platelet -     Ferritin  Leg swelling Now on Lasix twice a day, take first pill when she first wakes up, second 6 hours later after lunch. -     CMP14+EGFR  Essential hypertension Adequate control.  Has been elevated in the past.  Now on Lasix and losartan.  Check blood pressures at home.  Let me know if persistently elevated >122 systolic or >44 diastolic  Deep vein thrombosis (DVT) of proximal vein of left lower extremity, unspecified chronicity (HCC) Presence of IVC filter Not on blood thinner because of bleeding risk.  Has IVC filter in place.    Decreased mobility Discussed importance of improving mobility.  Patient open to physical therapy at home.  Referral in. -     Ambulatory referral to [place] -     Face-to-face encounter (required for Medicare/Medicaid patients)  Chronic bilateral low back pain with left-sided sciatica -      Ambulatory referral to [facility] -     Face-to-face encounter (required for Medicare/Medicaid patients)   Follow up plan: Return in about 3 months (around 03/11/2018). Assunta Found, MD [place]

## 2017-12-11 LAB — FERRITIN: FERRITIN: 81 ng/mL (ref 15–150)

## 2017-12-11 LAB — SPECIMEN STATUS REPORT

## 2017-12-13 ENCOUNTER — Other Ambulatory Visit: Payer: Self-pay | Admitting: Pediatrics

## 2017-12-18 ENCOUNTER — Ambulatory Visit (INDEPENDENT_AMBULATORY_CARE_PROVIDER_SITE_OTHER): Payer: Medicare Other

## 2017-12-18 DIAGNOSIS — Z6837 Body mass index (BMI) 37.0-37.9, adult: Secondary | ICD-10-CM

## 2017-12-18 DIAGNOSIS — M5137 Other intervertebral disc degeneration, lumbosacral region: Secondary | ICD-10-CM

## 2017-12-18 DIAGNOSIS — I130 Hypertensive heart and chronic kidney disease with heart failure and stage 1 through stage 4 chronic kidney disease, or unspecified chronic kidney disease: Secondary | ICD-10-CM | POA: Diagnosis not present

## 2017-12-18 DIAGNOSIS — I872 Venous insufficiency (chronic) (peripheral): Secondary | ICD-10-CM

## 2017-12-18 DIAGNOSIS — N183 Chronic kidney disease, stage 3 (moderate): Secondary | ICD-10-CM | POA: Diagnosis not present

## 2017-12-18 DIAGNOSIS — I50810 Right heart failure, unspecified: Secondary | ICD-10-CM

## 2017-12-18 DIAGNOSIS — Z85828 Personal history of other malignant neoplasm of skin: Secondary | ICD-10-CM

## 2017-12-18 DIAGNOSIS — E669 Obesity, unspecified: Secondary | ICD-10-CM

## 2017-12-18 DIAGNOSIS — M47812 Spondylosis without myelopathy or radiculopathy, cervical region: Secondary | ICD-10-CM

## 2017-12-18 DIAGNOSIS — Z86718 Personal history of other venous thrombosis and embolism: Secondary | ICD-10-CM

## 2017-12-18 DIAGNOSIS — I890 Lymphedema, not elsewhere classified: Secondary | ICD-10-CM

## 2017-12-19 ENCOUNTER — Ambulatory Visit (INDEPENDENT_AMBULATORY_CARE_PROVIDER_SITE_OTHER): Payer: Medicare Other | Admitting: *Deleted

## 2017-12-19 ENCOUNTER — Encounter: Payer: Self-pay | Admitting: *Deleted

## 2017-12-19 VITALS — BP 130/66 | HR 76 | Ht 68.0 in | Wt 228.0 lb

## 2017-12-19 DIAGNOSIS — Z0000 Encounter for general adult medical examination without abnormal findings: Secondary | ICD-10-CM | POA: Diagnosis not present

## 2017-12-19 NOTE — Patient Instructions (Signed)
Please work on your goal of increasing lean protein in your diet it.  Good examples would be chicken or [place] breast, fish, beans, eggs.  Consider getting the Shingrix (shingles vaccine) in the future.    Contact the number given for Agency of Aging for [facility] regarding help with transportation.  Please follow up with Dr. Evette Doffing as scheduled.   Thank you for coming in for your Annual Wellness Visit today!!    Preventive Care 81 Years and Older, Female Preventive care refers to lifestyle choices and visits with your health care provider that can promote health and wellness. What does preventive care include?  A yearly physical exam. This is also called an annual well check.  Dental exams once or twice a year.  Routine eye exams. Ask your health care provider how often you should have your eyes checked.  Personal lifestyle choices, including: ? Daily care of your teeth and gums. ? Regular physical activity. ? Eating a healthy diet. ? Avoiding tobacco and drug use. ? Limiting alcohol use. ? Practicing safe sex. ? Taking low-dose aspirin every day. ? Taking vitamin and mineral supplements as recommended by your health care provider. What happens during an annual well check? The services and screenings done by your health care provider during your annual well check will depend on your age, overall health, lifestyle risk factors, and family history of disease. Counseling Your health care provider may ask you questions about your:  Alcohol use.  Tobacco use.  Drug use.  Emotional well-being.  Home and relationship well-being.  Sexual activity.  Eating habits.  History of falls.  Memory and ability to understand (cognition).  Work and work Statistician.  Reproductive health.  Screening You may have the following tests or measurements:  Height, weight, and BMI.  Blood pressure.  Lipid and cholesterol levels. These may be checked every 5 years, or  more frequently if you are over 23 years old.  Skin check.  Lung cancer screening. You may have this screening every year starting at age 62 if you have a 30-pack-year history of smoking and currently smoke or have quit within the past 15 years.  Fecal occult blood test (FOBT) of the stool. You may have this test every year starting at age 52.  Flexible sigmoidoscopy or colonoscopy. You may have a sigmoidoscopy every 5 years or a colonoscopy every 10 years starting at age 55.  Hepatitis C blood test.  Hepatitis B blood test.  Sexually transmitted disease (STD) testing.  Diabetes screening. This is done by checking your blood sugar (glucose) after you have not eaten for a while (fasting). You may have this done every 1-3 years.  Bone density scan. This is done to screen for osteoporosis. You may have this done starting at age 66.  Mammogram. This may be done every 1-2 years. Talk to your health care provider about how often you should have regular mammograms.  Talk with your health care provider about your test results, treatment options, and if necessary, the need for more tests. Vaccines Your health care provider may recommend certain vaccines, such as:  Influenza vaccine. This is recommended every year.  Tetanus, diphtheria, and acellular pertussis (Tdap, Td) vaccine. You may need a Td booster every 10 years.  Varicella vaccine. You may need this if you have not been vaccinated.  Zoster vaccine. You may need this after age 54.  Measles, mumps, and rubella (MMR) vaccine. You may need at least one dose of MMR if you  were born in [birth year] or later. You may also need a second dose.  Pneumococcal 13-valent conjugate (PCV13) vaccine. One dose is recommended after age 60.  Pneumococcal polysaccharide (PPSV23) vaccine. One dose is recommended after age 48.  Meningococcal vaccine. You may need this if you have certain conditions.  Hepatitis A vaccine. You may need this if you have  certain conditions or if you travel or work in places where you may be exposed to hepatitis A.  Hepatitis B vaccine. You may need this if you have certain conditions or if you travel or work in places where you may be exposed to hepatitis B.  Haemophilus influenzae type b (Hib) vaccine. You may need this if you have certain conditions.  Talk to your health care provider about which screenings and vaccines you need and how often you need them. This information is not intended to replace advice given to you by your health care provider. Make sure you discuss any questions you have with your health care provider. Document Released: 01/28/2015 Document Revised: 09/21/2015 Document Reviewed: 11/02/2014 Elsevier Interactive Patient Education  2018 [place] in the Home Falls can cause injuries. They can happen to people of all ages. There are many things you can do to make your home safe and to help prevent falls. What can I do on the outside of my home?  Regularly fix the edges of walkways and driveways and fix any cracks.  Remove anything that might make you trip as you walk through a door, such as a raised step or threshold.  Trim any bushes or trees on the path to your home.  Use bright outdoor lighting.  Clear any walking paths of anything that might make someone trip, such as rocks or tools.  Regularly check to see if handrails are loose or broken. Make sure that both sides of any steps have handrails.  Any raised decks and porches should have guardrails on the edges.  Have any leaves, snow, or ice cleared regularly.  Use sand or salt on walking paths during winter.  Clean up any spills in your garage right away. This includes oil or grease spills. What can I do in the bathroom?  Use night lights.  Install grab bars by the toilet and in the tub and shower. Do not use towel bars as grab bars.  Use non-skid mats or decals in the tub or shower.  If you  need to sit down in the shower, use a plastic, non-slip stool.  Keep the floor dry. Clean up any water that spills on the floor as soon as it happens.  Remove soap buildup in the tub or shower regularly.  Attach bath mats securely with double-sided non-slip rug tape.  Do not have throw rugs and other things on the floor that can make you trip. What can I do in the bedroom?  Use night lights.  Make sure that you have a light by your bed that is easy to reach.  Do not use any sheets or blankets that are too big for your bed. They should not hang down onto the floor.  Have a firm chair that has side arms. You can use this for support while you get dressed.  Do not have throw rugs and other things on the floor that can make you trip. What can I do in the kitchen?  Clean up any spills right away.  Avoid walking on wet floors.  Keep items that you use  a lot in easy-to-reach places.  If you need to reach something above you, use a strong step stool that has a grab bar.  Keep electrical cords out of the way.  Do not use floor polish or wax that makes floors slippery. If you must use wax, use non-skid floor wax.  Do not have throw rugs and other things on the floor that can make you trip. What can I do with my stairs?  Do not leave any items on the stairs.  Make sure that there are handrails on both sides of the stairs and use them. Fix handrails that are broken or loose. Make sure that handrails are as long as the stairways.  Check any carpeting to make sure that it is firmly attached to the stairs. Fix any carpet that is loose or worn.  Avoid having throw rugs at the top or bottom of the stairs. If you do have throw rugs, attach them to the floor with carpet tape.  Make sure that you have a light switch at the top of the stairs and the bottom of the stairs. If you do not have them, ask someone to add them for you. What else can I do to help prevent falls?  Wear shoes  that: ? Do not have high heels. ? Have rubber bottoms. ? Are comfortable and fit you well. ? Are closed at the toe. Do not wear sandals.  If you use a stepladder: ? Make sure that it is fully opened. Do not climb a closed stepladder. ? Make sure that both sides of the stepladder are locked into place. ? Ask someone to hold it for you, if possible.  Clearly mark and make sure that you can see: ? Any grab bars or handrails. ? First and last steps. ? Where the edge of each step is.  Use tools that help you move around (mobility aids) if they are needed. These include: ? Canes. ? Walkers. ? Scooters. ? Crutches.  Turn on the lights when you go into a dark area. Replace any light bulbs as soon as they burn out.  Set up your furniture so you have a clear path. Avoid moving your furniture around.  If any of your floors are uneven, fix them.  If there are any pets around you, be aware of where they are.  Review your medicines with your doctor. Some medicines can make you feel dizzy. This can increase your chance of falling. Ask your doctor what other things that you can do to help prevent falls. This information is not intended to replace advice given to you by your health care provider. Make sure you discuss any questions you have with your health care provider. Document Released: 10/28/2008 Document Revised: 06/09/2015 Document Reviewed: 02/05/2014 Elsevier Interactive Patient Education  [employer].

## 2017-12-19 NOTE — Progress Notes (Signed)
Subjective:   Regina Hall is a 83 y.o. female who presents for an Initial Medicare Annual Wellness Visit.  Regina Hall is accompanied today by her husband.  She is riding in a wheel chair today, due to back pain she states.  She is a retired Corporate treasurer.  She enjoys reading and using her Ipad.  Regina Hall lives with her husband, she has 3 children, 6 grandchildren, and 4 great grandchildren.  She feels her health is worse this year than last year because her mobility has decreased due to back pain.  She reports one hospital admission for traumatic hematoma where a vena cath filter was inserted, one ER visit for DVT and surgery for basal cell carcinoma on her right eye lid.   Review of Systems     Musculoskeletal - bilateral hip and back pain Other systems negative   Cardiac Risk Factors include: advanced age (>54men, >33 women);hypertension;sedentary lifestyle;obesity (BMI >30kg/m2)     Objective:    Today's Vitals   12/19/17 1130  BP: 130/66  Pulse: 76  Weight: 228 lb (103.4 kg)  Height: 5\' 8"  (1.727 m)  PainSc: 5   PainLoc: Back   Body mass index is 34.67 kg/m.  Advanced Directives 12/19/2017 07/24/2017 07/21/2017 06/04/2017 05/29/2017 05/02/2017 07/06/2015  Does Patient Have a Medical Advance Directive? Yes Yes Yes Yes Yes Yes Yes  Type of Paramedic of [place];Living will [place];Living will [place];Living will Living will Living will [place];Living will Living will  Does patient want to make changes to medical advance directive? No - Patient declined - No - Patient declined No - Patient declined - - No - Patient declined  Copy of [place] in Chart? No - copy requested - No - copy requested - - No - copy requested No - copy requested  Would patient like information on creating a medical advance directive? - - - - - No - Patient declined -    Current Medications  (verified) Outpatient Encounter Medications as of 12/19/2017  Medication Sig  . aspirin EC 81 MG tablet Take 81 mg by mouth daily.  . Biotin 1 MG CAPS Take by mouth.  . Cholecalciferol (VITAMIN D3) 5000 units TABS Take 5,000 Units by mouth daily.  Marland Kitchen CRANBERRY PO Take by mouth.  . furosemide (LASIX) 20 MG tablet TK  1 T PO BID  . GLUCOSAMINE SULFATE PO Take 2 tablets by mouth daily.   Marland Kitchen HYDROcodone-acetaminophen (NORCO) 10-325 MG tablet Take 1 tablet by mouth every 6 (six) hours as needed.  . Hypromellose (ARTIFICIAL TEARS OP) Place 2 drops into both eyes daily as needed (for dry eyes).  Marland Kitchen losartan (COZAAR) 25 MG tablet TAKE 1 TABLET(25 MG) BY MOUTH EVERY MORNING  . Magnesium 250 MG TABS Take by mouth.  . montelukast (SINGULAIR) 10 MG tablet Take 10 mg by mouth every morning.  . vitamin B-12 (CYANOCOBALAMIN) 500 MCG tablet Take 500 mcg by mouth daily.  . ferrous sulfate 325 (65 FE) MG tablet Take 1 tablet (325 mg total) by mouth 2 (two) times daily with a meal. (Patient not taking: Reported on 12/19/2017)   Facility-Administered Encounter Medications as of 12/19/2017  Medication  . ondansetron (ZOFRAN) 4 mg in sodium chloride 0.9 % 50 mL IVPB    Allergies (verified) Penicillins   History: Past Medical History:  Diagnosis Date  . Anemia   . Anxiety    pt denies  .  Arthritis    KNEES AND BACK  . Bladder incontinence   . Cancer ([place])    basal cell cancer on nose  . Edema of both feet   . Hypertension   . Pneumonia   . PONV (postoperative nausea and vomiting)    PONV X 1 EPISODE, COMES OUT OF ANESTHESIA FAST, SOME AWARENESS DURING END OF COLONSCOPY  . Scoliosis    Past Surgical History:  Procedure Laterality Date  . ABDOMINAL HYSTERECTOMY     2006 VAG HYST  . BACK SURGERY     LOWER, X2  . BIOPSY Right 05/02/2017   Procedure: BIOPSY OF RIGHT UPPER EYELID LESION;  Surgeon: [name], MD;  Location: [facility];  Service: Ophthalmology;  Laterality: Right;  . EYE SURGERY  Bilateral    cataract surgery with lens implant  . JOINT REPLACEMENT     2011 LT HIP  . RECONSTRUCTION OF EYELID Right 05/02/2017   Procedure: TOTAL RECONSTRUCTION OF UPPER EYELID RIGHT EYE WITH FULL THICKNESS SKIN GRAFT FROM RIGHT POSTERIOR EAR;  Surgeon: [name], MD;  Location: [facility];  Service: Ophthalmology;  Laterality: Right;  . TONSILLECTOMY    . TOTAL KNEE ARTHROPLASTY Right 04/27/2014   Procedure: [place] TOTAL KNEE ARTHROPLASTY;  Surgeon: Rod Can, MD;  Location: [facility];  Service: Orthopedics;  Laterality: Right;  . TUBAL LIGATION     1974  . VENA CAVA FILTER PLACEMENT N/A 06/04/2017   Procedure: INSERTION VENA-CAVA FILTER;  Surgeon: Angelia Mould, MD;  Location: [facility];  Service: Vascular;  Laterality: N/A;   Family History  Problem Relation Age of Onset  . Congestive Heart Failure Mother   . Colon cancer Father   . Cancer Brother   . Arthritis Sister   . Asthma Sister   . Diabetes Son   . Diabetes Son    Social History   Socioeconomic History  . Marital status: Married    Spouse name: Not on file  . Number of children: 3  . Years of education: 47  . Highest education level: Associate degree: occupational, Hotel manager, or vocational program  Occupational History  . Occupation: Retired    Comment: Corporate treasurer  Social Needs  . Financial resource strain: Somewhat hard  . Food insecurity:    Worry: Never true    Inability: Never true  . Transportation needs:    Medical: No    Non-medical: No  Tobacco Use  . Smoking status: Never Smoker  . Smokeless tobacco: Never Used  Substance and Sexual Activity  . Alcohol use: Yes    Comment: OCC WINE  . Drug use: No  . Sexual activity: Not on file  Lifestyle  . Physical activity:    Days per week: 0 days    Minutes per session: 0 min  . Stress: Only a little  Relationships  . Social connections:    Talks on phone: More than three times a week    Gets together: Once a week    Attends  religious service: Never    Active member of club or organization: No    Attends meetings of clubs or organizations: Never    Relationship status: Married  Other Topics Concern  . Not on file  Social History Narrative  . Not on file   She reports that she does not have problems currently with transportation because her husband usually take her.  She states her husband has health concerns of his own that he will be going to doctor's  appointments for, and she is concerned that she will not have transportation available when needed when that occurs.  Gave her the contact information for the Agency on Aging in [facility] where she lives.  Advised her to contact them regarding getting setup for transportation with them if she ever needs it.      Clinical Intake:     Pain Score: 5  Bilateral hips and back                   Activities of Daily Living In your present state of health, do you have any difficulty performing the following activities: 12/19/2017 06/04/2017  Hearing? N -  Vision? Y -  Comment Does not see well out of left eye  -  Difficulty concentrating or making decisions? N -  Walking or climbing stairs? Y -  Comment Due to back pain  -  Dressing or bathing? Y -  Comment Difficult due to decreased mobility -  Doing errands, shopping? Y Y  Comment Husband provides transportation -  Conservation officer, nature and eating ? Y -  Comment Due to decreased mobility -  Using the Toilet? N -  In the past six months, have you accidently leaked urine? Y -  Comment incontinence  -  Do you have problems with loss of bowel control? N -  Managing your Medications? N -  Managing your Finances? N -  Housekeeping or managing your Housekeeping? Y -  Comment Due to back pain and mobility issues -  Some recent data might be hidden     Immunizations and Health Maintenance Immunization History  Administered Date(s) Administered  . Influenza-Unspecified 09/24/2007, 10/11/2009,  10/03/2010, 12/02/2011, 11/03/2013  . Pneumococcal Conjugate-13 11/03/2013  . Pneumococcal Polysaccharide-23 11/05/2002   Health Maintenance Due  Topic Date Due  . DEXA SCAN  12/25/2001  Dexa scan not indicated   Patient Care Team: Eustaquio Maize, MD as PCP - General (Pediatrics) Angelia Mould, MD as Consulting Physician (Vascular Surgery)       Assessment:   This is a routine wellness examination for Nicol.  Hearing/Vision screen No hearing deficit noted. Patient states she has had bilateral cataracts removed, and has some problems seeing with her left eye even with glasses.  She plans to follow up with   Dietary issues and exercise activities discussed:  Patient states she eats 3 meals per day.  Usually yogurt or cottage cheese and fruit for breakfast, salad with various vegetables for lunch, and Potatoes, brussell sprouts, and black eyed peas for supper.  Patient states she does not dislike meat, but does not eat very much of it.  Advised her to try to get lean protein at each meal.  Cottage cheese, beans, chicken, [place], fish, and eggs are good options.    Current Exercise Habits: The patient does not participate in regular exercise at present, Exercise limited by: orthopedic condition(s)  Goals    . DIET - INCREASE LEAN PROTEINS     Good examples are chicken, [place], fish, eggs, and beans      Depression Screen PHQ 2/9 Scores 12/19/2017 12/09/2017  PHQ - 2 Score 0 0    Fall Risk Fall Risk  12/19/2017 12/09/2017  Falls in the past year? 1 0  Number falls in past yr: 1 -  Injury with Fall? 0 -  Risk for fall due to : Impaired balance/gait;Impaired mobility -  Follow up Education provided;Falls prevention discussed -    Is the patient's  home free of loose throw rugs in walkways, pet beds, electrical cords, etc?   yes      Grab bars in the bathroom? no      Handrails on the stairs?   no stairs in home      Adequate lighting?   yes    Cognitive  Function: MMSE - Mini Mental State Exam 12/19/2017  Orientation to time 5  Orientation to Place 4  Registration 3  Attention/ Calculation 5  Recall 3  Language- name 2 objects 2  Language- repeat 1  Language- follow 3 step command 3  Language- read & follow direction 1  Write a sentence 1  Copy design 1  Total score 29        Screening Tests Health Maintenance  Topic Date Due  . DEXA SCAN  12/25/2001  . INFLUENZA VACCINE  12/10/2018 (Originally 08/15/2017)  . TETANUS/TDAP  12/10/2018 (Originally 12/26/1953)  . PNA vac Low Risk Adult  Completed    Dexa scan not indicated. Flu and tdap vaccines declined   Qualifies for Shingles Vaccine? Yes, declined today  Cancer Screenings: Lung: Low Dose CT Chest recommended if Age 15-80 years, 30 pack-year currently smoking OR have quit w/in 15years. Patient does not qualify. Breast: Up to date on Mammogram? Recommend discussing need with Dr. Evette Doffing Up to date of Bone Density/Dexa? Not indicated   Colorectal: Not indicated  Additional Screenings:  Hepatitis C Screening:  Not indicated     Plan:     Work on your goal of increasing lean protein in your diet it.  Good examples would be chicken or [place] breast, fish, beans, eggs. Consider getting the Shingrix (shingles vaccine) in the future.   Contact the number given for Agency of Aging for [facility] regarding help with transportation as needed. Please follow up with Dr. Evette Doffing as scheduled.    I have personally reviewed and noted the following in the patient's chart:   . Medical and social history . Use of alcohol, tobacco or illicit drugs  . Current medications and supplements . Functional ability and status . Nutritional status . Physical activity . Advanced directives . List of other physicians . Hospitalizations, surgeries, and ER visits in previous 12 months . Vitals . Screenings to include cognitive, depression, and falls . Referrals and  appointments  In addition, I have reviewed and discussed with patient certain preventive protocols, quality metrics, and best practice recommendations. A written personalized care plan for preventive services as well as general preventive health recommendations were provided to patient.     Denyce Robert, RN   12/19/2017

## 2018-01-23 ENCOUNTER — Telehealth: Payer: Self-pay | Admitting: Pediatrics

## 2018-01-24 NOTE — Telephone Encounter (Signed)
appt scheduled Pt notified 

## 2018-01-28 ENCOUNTER — Encounter: Payer: Self-pay | Admitting: Pediatrics

## 2018-01-28 ENCOUNTER — Ambulatory Visit (INDEPENDENT_AMBULATORY_CARE_PROVIDER_SITE_OTHER): Payer: Medicare Other | Admitting: Pediatrics

## 2018-01-28 VITALS — BP 132/74 | HR 74 | Temp 97.4°F | Ht 68.0 in

## 2018-01-28 DIAGNOSIS — G8929 Other chronic pain: Secondary | ICD-10-CM | POA: Diagnosis not present

## 2018-01-28 DIAGNOSIS — R600 Localized edema: Secondary | ICD-10-CM

## 2018-01-28 DIAGNOSIS — M545 Low back pain, unspecified: Secondary | ICD-10-CM

## 2018-01-28 DIAGNOSIS — L03119 Cellulitis of unspecified part of limb: Secondary | ICD-10-CM

## 2018-01-28 MED ORDER — FUROSEMIDE 20 MG PO TABS: ORAL_TABLET | ORAL | 5 refills | Status: DC

## 2018-01-28 MED ORDER — HYDROCODONE-ACETAMINOPHEN 10-325 MG PO TABS: 1.0000 | ORAL_TABLET | Freq: Four times a day (QID) | ORAL | 0 refills | Status: DC | PRN

## 2018-01-28 NOTE — Progress Notes (Signed)
Subjective:   Patient ID: Regina Hall, female    DOB: 07-Oct-1934, 84 y.o.   MRN: [id number] CC: Medical Management of Chronic Issues  HPI: Regina Hall is a 84 y.o. female   Admitted to hospital three weeks ago for cellulitis, stayed for about a week. Following with wound clinic in [place], they admitted her for IV antibiotics for worsening cellulitis in her legs. Was discharged to rehab in [place], there for about 2 weeks. Records not available yet.    Tried to do physical therapy in rehab.  She thinks it made her back pain worse, was not helping with her leg swelling.  Has been moving around ok at home, back pain still bothering her.  Hard for her to keep her legs elevated to the level of her heart, makes her back pain worse.  Treated with antibiotics, finished last five days of augmentin while in rehab.  Chronic lower back pain: Takes hydrocodone as needed, not every day.  Last prescription was from several months ago.  Previously filled by her last primary care doctor.  She has not had any shortness of breath or chest pain since leaving the hospital.  She has follow-up appointments with her wound doctor, and vascular surgery for her legs.  She also has an appointment coming up with her kidney doctor.  Saw her heart doctor last in March 2019.  She has been taking Lasix 20 mg twice a day since leaving the hospital.  She does not think is been helping with her leg swelling much.  While in rehab she was switched to bumetanide, 1 mg twice daily.  Not clear if that helped anymore in the Lasix.  Relevant past medical, surgical, family and social history reviewed. Allergies and medications reviewed and updated. Social History   Tobacco Use  Smoking Status Never Smoker  Smokeless Tobacco Never Used   ROS: Per HPI   Objective:    BP 132/74   Pulse 74   Temp (!) 97.4 F (36.3 C) (Oral)   Ht '5\' 8"'  (1.727 m)   BMI 34.67 kg/m   Wt Readings from Last 3 Encounters:  12/19/17 228 lb  (103.4 kg)  12/09/17 221 lb 9.6 oz (100.5 kg)  07/24/17 210 lb (95.3 kg)    Gen: NAD, alert, cooperative with exam, NCAT EYES: EOMI, no conjunctival injection, or no icterus ENT:  OP without erythema LYMPH: no cervical LAD CV: NRRR, normal S1/S2, no murmur, distal pulses 2+ b/l Resp: CTABL, no wheezes, normal WOB Abd: +BS, soft, NTND. Ext: 2+ pitting edema bilateral ankles edema, warm Neuro: Alert and oriented, sitting in wheelchair Skin: Slightly pink, warm lower left leg.  No skin breakdown.  Assessment & Plan:  Aryanna was seen today for medical management of chronic issues.  Diagnoses and all orders for this visit:  Chronic midline low back pain without sciatica Ongoing symptoms, exercise makes it worse.  Will do short-term #20 tabs of below.  If becomes a long-term medicine, will need to do controlled substance agreement and tox assure. -     HYDROcodone-acetaminophen (NORCO) 10-325 MG tablet; Take 1 tablet by mouth every 6 (six) hours as needed.  Fluid retention in legs Increased swelling in lower legs, will increase Lasix.  Check kidney function today, will need to be checked again next visit. -     furosemide (LASIX) 20 MG tablet; Take 37m twice daily, at 6am and 2pm -     CMP14+EGFR -     CBC with Differential/Platelet  Cellulitis of lower extremity, unspecified laterality Now finished with antibiotics.  Has follow-up tomorrow with her wound doctor.  She has been getting leg wrappings regularly with home health nursing through the wound clinic office. -     CMP14+EGFR -     CBC with Differential/Platelet   Follow up plan: Return in about 5 weeks (around 03/04/2018). Assunta Found, MD [place]

## 2018-01-29 ENCOUNTER — Encounter: Payer: Self-pay | Admitting: Vascular Surgery

## 2018-01-29 ENCOUNTER — Other Ambulatory Visit: Payer: Self-pay

## 2018-01-29 ENCOUNTER — Ambulatory Visit (INDEPENDENT_AMBULATORY_CARE_PROVIDER_SITE_OTHER): Payer: Medicare Other | Admitting: Vascular Surgery

## 2018-01-29 VITALS — BP 124/63 | HR 84 | Temp 98.4°F | Resp 16 | Ht 65.5 in | Wt 214.0 lb

## 2018-01-29 DIAGNOSIS — I82402 Acute embolism and thrombosis of unspecified deep veins of left lower extremity: Secondary | ICD-10-CM

## 2018-01-29 DIAGNOSIS — I872 Venous insufficiency (chronic) (peripheral): Secondary | ICD-10-CM | POA: Diagnosis not present

## 2018-01-29 DIAGNOSIS — Z95828 Presence of other vascular implants and grafts: Secondary | ICD-10-CM

## 2018-01-29 DIAGNOSIS — I890 Lymphedema, not elsewhere classified: Secondary | ICD-10-CM

## 2018-01-29 LAB — CMP14+EGFR
ALT: 11 IU/L (ref 0–32)
AST: 15 IU/L (ref 0–40)
Albumin/Globulin Ratio: 1.7 (ref 1.2–2.2)
Albumin: 4.1 g/dL (ref 3.5–4.7)
Alkaline Phosphatase: 91 IU/L (ref 39–117)
BUN/Creatinine Ratio: 26 (ref 12–28)
BUN: 33 mg/dL — ABNORMAL HIGH (ref 8–27)
Bilirubin Total: 0.4 mg/dL (ref 0.0–1.2)
CO2: 24 mmol/L (ref 20–29)
Calcium: 10.1 mg/dL (ref 8.7–10.3)
Chloride: 101 mmol/L (ref 96–106)
Creatinine, Ser: 1.25 mg/dL — ABNORMAL HIGH (ref 0.57–1.00)
GFR calc non Af Amer: 40 mL/min/{1.73_m2} — ABNORMAL LOW (ref >59)
GFR, EST AFRICAN AMERICAN: 47 mL/min/{1.73_m2} — AB (ref >59)
Globulin, Total: 2.4 g/dL (ref 1.5–4.5)
Glucose: 130 mg/dL — ABNORMAL HIGH (ref 65–99)
Potassium: 4.4 mmol/L (ref 3.5–5.2)
SODIUM: 143 mmol/L (ref 134–144)
Total Protein: 6.5 g/dL (ref 6.0–8.5)

## 2018-01-29 LAB — CBC WITH DIFFERENTIAL/PLATELET
Basophils Absolute: 0 10*3/uL (ref 0.0–0.2)
Basos: 1 %
EOS (ABSOLUTE): 0.2 10*3/uL (ref 0.0–0.4)
Eos: 3 %
HEMATOCRIT: 40.8 % (ref 34.0–46.6)
Hemoglobin: 13.8 g/dL (ref 11.1–15.9)
Immature Grans (Abs): 0 10*3/uL (ref 0.0–0.1)
Immature Granulocytes: 0 %
Lymphocytes Absolute: 1.6 10*3/uL (ref 0.7–3.1)
Lymphs: 23 %
MCH: 30.6 pg (ref 26.6–33.0)
MCHC: 33.8 g/dL (ref 31.5–35.7)
MCV: 91 fL (ref 79–97)
Monocytes Absolute: 0.7 10*3/uL (ref 0.1–0.9)
Monocytes: 9 %
NEUTROS ABS: 4.5 10*3/uL (ref 1.4–7.0)
Neutrophils: 64 %
Platelets: 173 10*3/uL (ref 150–450)
RBC: 4.51 x10E6/uL (ref 3.77–5.28)
RDW: 12.7 % (ref 11.7–15.4)
WBC: 7.1 10*3/uL (ref 3.4–10.8)

## 2018-01-29 NOTE — Progress Notes (Signed)
Patient name: Regina Hall MRN: [id number] DOB: [birth year] Sex: female  REASON FOR VISIT:   Follow-up of IVC filter.  HPI:   Regina Hall is a pleasant 84 y.o. female who I last saw on 07/24/2017.  I previously seen her in consultation in the hospital with a left lower extremity DVT.  She was on Coumadin and developed a significant right thigh hematoma.  Given the contraindication to heparin and anticoagulation an IVC filter was placed.  Patient was not felt to be a good candidate for long-term anticoagulation given that she had a spontaneous hematoma in her right thigh.  When I saw her last she was not on anticoagulation was felt to be high risk for bleeding if she was on anticoagulation.  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  At the time of her last visit I felt that given that she was at high risk for anticoagulation, I did not favor removing the filter.  I set her up for 49-month follow-up visit with a plain abdominal x-ray.  Since I saw her last she continues to have significant problems with leg swelling.  She has been in the hospital twice in the last month with cellulitis of her legs and required intravenous antibiotics.  She is had chronic leg swelling.  She is been trying to elevate her legs especially canned and this does help.  She has been wearing compression stockings for several months now.  It is very difficult for her to exercise as her leg swelling limits her mobility significantly.  She has had leg swelling for many years but over the last 6 months especially this has been rapidly progressive.  She has been unable to control her swelling with leg elevation.  The swelling significantly limits her range of motion and impairs her mobility.  She has had some lymphorrhea.  She is failed conservative measures including leg elevation, compression stockings and exercise over the last several months.  Past Medical History:   Diagnosis Date  . Anemia   . Anxiety    pt denies  . Arthritis    KNEES AND BACK  . Bladder incontinence   . Cancer ([place])    basal cell cancer on nose  . Edema of both feet   . Hypertension   . Pneumonia   . PONV (postoperative nausea and vomiting)    PONV X 1 EPISODE, COMES OUT OF ANESTHESIA FAST, SOME AWARENESS DURING END OF COLONSCOPY  . Scoliosis     Family History  Problem Relation Age of Onset  . Congestive Heart Failure Mother   . Colon cancer Father   . Cancer Brother   . Arthritis Sister   . Asthma Sister   . Diabetes Son   . Diabetes Son     SOCIAL HISTORY: Social History   Tobacco Use  . Smoking status: Never Smoker  . Smokeless tobacco: Never Used  Substance Use Topics  . Alcohol use: Yes    Comment: OCC WINE    Allergies  Allergen Reactions  . Penicillins Rash and Other (See Comments)    Has patient had a PCN reaction causing immediate rash, facial/tongue/throat swelling, SOB or lightheadedness with hypotension: ###Yes## Has patient had a PCN reaction causing severe rash involving mucus membranes or skin necrosis: No Has patient had a PCN reaction that required hospitalization No Has patient had a PCN reaction occurring within the last 10 years: No If all of the  above answers are "NO", then may proceed with Cephalosporin use.   . Vancomycin     Itching, SOB     Current Outpatient Medications  Medication Sig Dispense Refill  . aspirin EC 81 MG tablet Take 81 mg by mouth daily.    . Biotin 1 MG CAPS Take by mouth.    . Cholecalciferol (VITAMIN D3) 5000 units TABS Take 5,000 Units by mouth daily.    Marland Kitchen CRANBERRY PO Take by mouth.    . furosemide (LASIX) 20 MG tablet Take 40mg  twice daily, at 6am and 2pm 120 tablet 5  . GLUCOSAMINE SULFATE PO Take 2 tablets by mouth daily.     Marland Kitchen HYDROcodone-acetaminophen (NORCO) 10-325 MG tablet Take 1 tablet by mouth every 6 (six) hours as needed. 20 tablet 0  . Hypromellose (ARTIFICIAL TEARS OP) Place 2 drops  into both eyes daily as needed (for dry eyes).    Marland Kitchen losartan (COZAAR) 25 MG tablet TAKE 1 TABLET(25 MG) BY MOUTH EVERY MORNING 90 tablet 1  . Magnesium 250 MG TABS Take by mouth.    . montelukast (SINGULAIR) 10 MG tablet Take 10 mg by mouth every morning.  0  . vitamin B-12 (CYANOCOBALAMIN) 500 MCG tablet Take 500 mcg by mouth daily.    . ferrous sulfate 325 (65 FE) MG tablet Take 1 tablet (325 mg total) by mouth 2 (two) times daily with a meal. (Patient not taking: Reported on 01/29/2018) 60 tablet 3   No current facility-administered medications for this visit.    Facility-Administered Medications Ordered in Other Visits  Medication Dose Route Frequency Provider Last Rate Last Dose  . ondansetron (ZOFRAN) 4 mg in sodium chloride 0.9 % 50 mL IVPB  4 mg Intravenous Once Rod Can, MD        REVIEW OF SYSTEMS:  [X]  denotes positive finding, [ ]  denotes negative finding Cardiac  Comments:  Chest pain or chest pressure:    Shortness of breath upon exertion: x   Short of breath when lying flat:    Irregular heart rhythm:        Vascular    Pain in calf, thigh, or hip brought on by ambulation:    Pain in feet at night that wakes you up from your sleep:     Blood clot in your veins:    Leg swelling:  x       Pulmonary    Oxygen at home:    Productive cough:     Wheezing:         Neurologic    Sudden weakness in arms or legs:     Sudden numbness in arms or legs:     Sudden onset of difficulty speaking or slurred speech:    Temporary loss of vision in one eye:     Problems with dizziness:         Gastrointestinal    Blood in stool:     Vomited blood:         Genitourinary    Burning when urinating:     Blood in urine:        Psychiatric    Major depression:         Hematologic    Bleeding problems:    Problems with blood clotting too easily:        Skin    Rashes or ulcers:        Constitutional    Fever or chills:     PHYSICAL EXAM:  Vitals:   01/29/18  0930  BP: 124/63  Pulse: 84  Resp: 16  Temp: 98.4 F (36.9 C)  TempSrc: Oral  SpO2: 94%  Weight: 214 lb (97.1 kg)  Height: 5' 5.5" (1.664 m)   GENERAL: The patient is a well-nourished female, in no acute distress. The vital signs are documented above. CARDIAC: There is a regular rate and rhythm.  VASCULAR: I do not detect carotid bruits. Because of her leg swelling I cannot palpate pedal pulses however both feet appear adequately perfused. The patient has stage II swelling (International Society lymph allergy).  She has some pitting edema but is in the later stages of stage II swelling given that she is also developing some some fibrosis.  She has swelling on the dorsum of both feet consistent with lymphedema.  She has hyperpigmentation bilaterally consistent with chronic venous insufficiency. PULMONARY: There is good air exchange bilaterally without wheezing or rales. ABDOMEN: Soft and non-tender with normal pitched bowel sounds.  MUSCULOSKELETAL: There are no major deformities or cyanosis. NEUROLOGIC: No focal weakness or paresthesias are detected. SKIN: There are no ulcers or rashes noted. PSYCHIATRIC: The patient has a normal affect.  DATA:    No new data.  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  I have discussed with her the importance of intermittent leg elevation the proper positioning for this and she has been doing this.  We have also discussed the importance of compression stockings and she is concerned that the one she has does not fit properly so I have written a prescription for knee-high compression stockings with a mild gradient given that she has a very hard time getting these on.  I discussed the importance of exercise however is very difficult to exercise because of her limited mobility because of her leg  swelling.  We have discussed the option of water aerobics.  I have encouraged her to keep her skin well lubricated.  I encouraged her to look at some of the videos online on self manual lymphatic drainage techniques although I think this would be difficult for her to perform.  I think she would be an excellent candidate for the Flexitouch Plus pneumatic compression system.  We will send her information to see if she can potentially be approved for this.  I will plan on seeing her back in 3 months with an abdominal x-ray obtained at that time to follow-up her IVC filter which we plan on leaving in given her risk of anticoagulation.  STATUS POST IVC FILTER: I did not recommend removal of her filter given that she cannot be anticoagulated because of her risk of bleeding.  I have ordered abdominal x-ray when I see her back in 3 months.  Deitra Mayo [facility] [phone]

## 2018-01-30 ENCOUNTER — Telehealth: Payer: Self-pay | Admitting: Vascular Surgery

## 2018-02-06 ENCOUNTER — Ambulatory Visit (HOSPITAL_COMMUNITY)
Admission: RE | Admit: 2018-02-06 | Discharge: 2018-02-06 | Disposition: A | Discharge status: Home / Self Care | Payer: Medicare Other | Source: Ambulatory Visit | Attending: Vascular Surgery | Admitting: Vascular Surgery

## 2018-02-06 DIAGNOSIS — Z95828 Presence of other vascular implants and grafts: Secondary | ICD-10-CM | POA: Insufficient documentation

## 2018-02-19 ENCOUNTER — Telehealth: Payer: Self-pay | Admitting: Family Medicine

## 2018-02-19 NOTE — Telephone Encounter (Signed)
appt scheduled Pt notified 

## 2018-02-20 ENCOUNTER — Inpatient Hospital Stay (HOSPITAL_COMMUNITY)
Admission: EM | Admit: 2018-02-20 | Discharge: 2018-03-13 | DRG: 329 | Disposition: A | Discharge status: Skilled Nursing Facility | Payer: Medicare Other | Attending: Internal Medicine | Admitting: Internal Medicine

## 2018-02-20 ENCOUNTER — Other Ambulatory Visit: Payer: Self-pay

## 2018-02-20 ENCOUNTER — Encounter (HOSPITAL_COMMUNITY): Payer: Self-pay | Admitting: Emergency Medicine

## 2018-02-20 ENCOUNTER — Ambulatory Visit (INDEPENDENT_AMBULATORY_CARE_PROVIDER_SITE_OTHER): Payer: Medicare Other | Admitting: Family Medicine

## 2018-02-20 ENCOUNTER — Emergency Department (HOSPITAL_COMMUNITY): Payer: Medicare Other

## 2018-02-20 ENCOUNTER — Encounter: Payer: Self-pay | Admitting: Family Medicine

## 2018-02-20 VITALS — BP 143/78 | HR 97 | Temp 97.8°F

## 2018-02-20 DIAGNOSIS — T8131XA Disruption of external operation (surgical) wound, not elsewhere classified, initial encounter: Secondary | ICD-10-CM | POA: Diagnosis not present

## 2018-02-20 DIAGNOSIS — R188 Other ascites: Secondary | ICD-10-CM | POA: Diagnosis present

## 2018-02-20 DIAGNOSIS — M419 Scoliosis, unspecified: Secondary | ICD-10-CM | POA: Diagnosis present

## 2018-02-20 DIAGNOSIS — B952 Enterococcus as the cause of diseases classified elsewhere: Secondary | ICD-10-CM | POA: Diagnosis present

## 2018-02-20 DIAGNOSIS — R933 Abnormal findings on diagnostic imaging of other parts of digestive tract: Secondary | ICD-10-CM | POA: Diagnosis not present

## 2018-02-20 DIAGNOSIS — Z881 Allergy status to other antibiotic agents status: Secondary | ICD-10-CM

## 2018-02-20 DIAGNOSIS — I471 Supraventricular tachycardia: Secondary | ICD-10-CM | POA: Diagnosis not present

## 2018-02-20 DIAGNOSIS — K6389 Other specified diseases of intestine: Secondary | ICD-10-CM | POA: Diagnosis not present

## 2018-02-20 DIAGNOSIS — Z9911 Dependence on respirator [ventilator] status: Secondary | ICD-10-CM | POA: Diagnosis not present

## 2018-02-20 DIAGNOSIS — J811 Chronic pulmonary edema: Secondary | ICD-10-CM

## 2018-02-20 DIAGNOSIS — E871 Hypo-osmolality and hyponatremia: Secondary | ICD-10-CM | POA: Diagnosis present

## 2018-02-20 DIAGNOSIS — J9801 Acute bronchospasm: Secondary | ICD-10-CM | POA: Diagnosis present

## 2018-02-20 DIAGNOSIS — N179 Acute kidney failure, unspecified: Secondary | ICD-10-CM

## 2018-02-20 DIAGNOSIS — Z79899 Other long term (current) drug therapy: Secondary | ICD-10-CM

## 2018-02-20 DIAGNOSIS — J9601 Acute respiratory failure with hypoxia: Secondary | ICD-10-CM | POA: Diagnosis not present

## 2018-02-20 DIAGNOSIS — Z8249 Family history of ischemic heart disease and other diseases of the circulatory system: Secondary | ICD-10-CM

## 2018-02-20 DIAGNOSIS — A419 Sepsis, unspecified organism: Secondary | ICD-10-CM | POA: Diagnosis not present

## 2018-02-20 DIAGNOSIS — K658 Other peritonitis: Secondary | ICD-10-CM

## 2018-02-20 DIAGNOSIS — Z7982 Long term (current) use of aspirin: Secondary | ICD-10-CM

## 2018-02-20 DIAGNOSIS — G8929 Other chronic pain: Secondary | ICD-10-CM | POA: Diagnosis present

## 2018-02-20 DIAGNOSIS — I9581 Postprocedural hypotension: Secondary | ICD-10-CM | POA: Diagnosis not present

## 2018-02-20 DIAGNOSIS — I1310 Hypertensive heart and chronic kidney disease without heart failure, with stage 1 through stage 4 chronic kidney disease, or unspecified chronic kidney disease: Secondary | ICD-10-CM | POA: Diagnosis present

## 2018-02-20 DIAGNOSIS — Z96642 Presence of left artificial hip joint: Secondary | ICD-10-CM | POA: Diagnosis present

## 2018-02-20 DIAGNOSIS — T8132XA Disruption of internal operation (surgical) wound, not elsewhere classified, initial encounter: Secondary | ICD-10-CM | POA: Diagnosis not present

## 2018-02-20 DIAGNOSIS — D631 Anemia in chronic kidney disease: Secondary | ICD-10-CM | POA: Diagnosis present

## 2018-02-20 DIAGNOSIS — K5900 Constipation, unspecified: Secondary | ICD-10-CM | POA: Diagnosis not present

## 2018-02-20 DIAGNOSIS — N183 Chronic kidney disease, stage 3 (moderate): Secondary | ICD-10-CM | POA: Diagnosis present

## 2018-02-20 DIAGNOSIS — C187 Malignant neoplasm of sigmoid colon: Secondary | ICD-10-CM | POA: Diagnosis present

## 2018-02-20 DIAGNOSIS — Z90710 Acquired absence of both cervix and uterus: Secondary | ICD-10-CM

## 2018-02-20 DIAGNOSIS — R509 Fever, unspecified: Secondary | ICD-10-CM

## 2018-02-20 DIAGNOSIS — K5732 Diverticulitis of large intestine without perforation or abscess without bleeding: Secondary | ICD-10-CM | POA: Diagnosis not present

## 2018-02-20 DIAGNOSIS — R3129 Other microscopic hematuria: Secondary | ICD-10-CM | POA: Diagnosis present

## 2018-02-20 DIAGNOSIS — E669 Obesity, unspecified: Secondary | ICD-10-CM | POA: Diagnosis present

## 2018-02-20 DIAGNOSIS — Z9851 Tubal ligation status: Secondary | ICD-10-CM

## 2018-02-20 DIAGNOSIS — Z95828 Presence of other vascular implants and grafts: Secondary | ICD-10-CM

## 2018-02-20 DIAGNOSIS — I4891 Unspecified atrial fibrillation: Secondary | ICD-10-CM | POA: Diagnosis not present

## 2018-02-20 DIAGNOSIS — Z01818 Encounter for other preprocedural examination: Secondary | ICD-10-CM

## 2018-02-20 DIAGNOSIS — I872 Venous insufficiency (chronic) (peripheral): Secondary | ICD-10-CM | POA: Diagnosis present

## 2018-02-20 DIAGNOSIS — J449 Chronic obstructive pulmonary disease, unspecified: Secondary | ICD-10-CM | POA: Diagnosis present

## 2018-02-20 DIAGNOSIS — K631 Perforation of intestine (nontraumatic): Secondary | ICD-10-CM | POA: Diagnosis not present

## 2018-02-20 DIAGNOSIS — K5720 Diverticulitis of large intestine with perforation and abscess without bleeding: Principal | ICD-10-CM

## 2018-02-20 DIAGNOSIS — E8770 Fluid overload, unspecified: Secondary | ICD-10-CM | POA: Diagnosis not present

## 2018-02-20 DIAGNOSIS — R059 Cough, unspecified: Secondary | ICD-10-CM

## 2018-02-20 DIAGNOSIS — F419 Anxiety disorder, unspecified: Secondary | ICD-10-CM | POA: Diagnosis present

## 2018-02-20 DIAGNOSIS — C786 Secondary malignant neoplasm of retroperitoneum and peritoneum: Secondary | ICD-10-CM | POA: Diagnosis present

## 2018-02-20 DIAGNOSIS — K56609 Unspecified intestinal obstruction, unspecified as to partial versus complete obstruction: Secondary | ICD-10-CM

## 2018-02-20 DIAGNOSIS — R05 Cough: Secondary | ICD-10-CM

## 2018-02-20 DIAGNOSIS — J810 Acute pulmonary edema: Secondary | ICD-10-CM | POA: Diagnosis not present

## 2018-02-20 DIAGNOSIS — Z880 Allergy status to penicillin: Secondary | ICD-10-CM

## 2018-02-20 DIAGNOSIS — K5909 Other constipation: Secondary | ICD-10-CM

## 2018-02-20 DIAGNOSIS — R0600 Dyspnea, unspecified: Secondary | ICD-10-CM

## 2018-02-20 DIAGNOSIS — Z85828 Personal history of other malignant neoplasm of skin: Secondary | ICD-10-CM

## 2018-02-20 DIAGNOSIS — R5381 Other malaise: Secondary | ICD-10-CM | POA: Diagnosis not present

## 2018-02-20 DIAGNOSIS — Z1621 Resistance to vancomycin: Secondary | ICD-10-CM | POA: Diagnosis present

## 2018-02-20 DIAGNOSIS — Z86718 Personal history of other venous thrombosis and embolism: Secondary | ICD-10-CM

## 2018-02-20 DIAGNOSIS — J9690 Respiratory failure, unspecified, unspecified whether with hypoxia or hypercapnia: Secondary | ICD-10-CM

## 2018-02-20 DIAGNOSIS — I480 Paroxysmal atrial fibrillation: Secondary | ICD-10-CM | POA: Diagnosis not present

## 2018-02-20 DIAGNOSIS — Z96651 Presence of right artificial knee joint: Secondary | ICD-10-CM | POA: Diagnosis present

## 2018-02-20 HISTORY — DX: Carrier of other specified bacterial diseases: Z22.39

## 2018-02-20 LAB — COMPREHENSIVE METABOLIC PANEL
ALBUMIN: 3.5 g/dL (ref 3.5–5.0)
ALT: 12 U/L (ref 0–44)
AST: 15 U/L (ref 15–41)
Alkaline Phosphatase: 77 U/L (ref 38–126)
Anion gap: 8 (ref 5–15)
BUN: 23 mg/dL (ref 8–23)
CO2: 29 mmol/L (ref 22–32)
Calcium: 9.1 mg/dL (ref 8.9–10.3)
Chloride: 101 mmol/L (ref 98–111)
Creatinine, Ser: 1.06 mg/dL — ABNORMAL HIGH (ref 0.44–1.00)
GFR calc Af Amer: 57 mL/min — ABNORMAL LOW (ref >60)
GFR calc non Af Amer: 49 mL/min — ABNORMAL LOW (ref >60)
Glucose, Bld: 145 mg/dL — ABNORMAL HIGH (ref 70–99)
Potassium: 3.6 mmol/L (ref 3.5–5.1)
Sodium: 138 mmol/L (ref 135–145)
Total Bilirubin: 0.8 mg/dL (ref 0.3–1.2)
Total Protein: 6.3 g/dL — ABNORMAL LOW (ref 6.5–8.1)

## 2018-02-20 LAB — CBC
HCT: 40.4 % (ref 36.0–46.0)
Hemoglobin: 12.6 g/dL (ref 12.0–15.0)
MCH: 29.9 pg (ref 26.0–34.0)
MCHC: 31.2 g/dL (ref 30.0–36.0)
MCV: 95.7 fL (ref 80.0–100.0)
NRBC: 0 % (ref 0.0–0.2)
Platelets: 187 10*3/uL (ref 150–400)
RBC: 4.22 MIL/uL (ref 3.87–5.11)
RDW: 13 % (ref 11.5–15.5)
WBC: 11.8 10*3/uL — ABNORMAL HIGH (ref 4.0–10.5)

## 2018-02-20 LAB — URINALYSIS, ROUTINE W REFLEX MICROSCOPIC
Bilirubin Urine: NEGATIVE
Glucose, UA: NEGATIVE mg/dL
Ketones, ur: NEGATIVE mg/dL
Leukocytes, UA: NEGATIVE
Nitrite: NEGATIVE
Protein, ur: NEGATIVE mg/dL
SPECIFIC GRAVITY, URINE: 1.02 (ref 1.005–1.030)
pH: 8 (ref 5.0–8.0)

## 2018-02-20 MED ORDER — ACETAMINOPHEN 325 MG PO TABS
650.0000 mg | ORAL_TABLET | Freq: Four times a day (QID) | ORAL | Status: DC | PRN
  Administered 2018-03-06 – 2018-03-09 (×2): 650 mg via ORAL
  Filled 2018-02-20 (×3): qty 2

## 2018-02-20 MED ORDER — POLYETHYLENE GLYCOL 3350 17 GM/SCOOP PO POWD: 17.0000 g | Freq: Two times a day (BID) | ORAL | 1 refills | Status: DC | PRN

## 2018-02-20 MED ORDER — SODIUM CHLORIDE 0.9 % IV SOLN
INTRAVENOUS | Status: DC | PRN
  Administered 2018-02-20: 250 mL via INTRAVENOUS

## 2018-02-20 MED ORDER — METRONIDAZOLE 500 MG PO TABS
500.0000 mg | ORAL_TABLET | Freq: Once | ORAL | Status: AC
  Administered 2018-02-20: 500 mg via ORAL
  Filled 2018-02-20: qty 1

## 2018-02-20 MED ORDER — IOHEXOL 300 MG/ML  SOLN
100.0000 mL | Freq: Once | INTRAMUSCULAR | Status: AC | PRN
  Administered 2018-02-20: 100 mL via INTRAVENOUS

## 2018-02-20 MED ORDER — MINERAL OIL RE ENEM: 1.0000 | ENEMA | Freq: Once | RECTAL | 0 refills | Status: AC

## 2018-02-20 MED ORDER — HYDROMORPHONE HCL 1 MG/ML IJ SOLN
1.0000 mg | Freq: Once | INTRAMUSCULAR | Status: AC
  Administered 2018-02-20: 1 mg via INTRAVENOUS
  Filled 2018-02-20: qty 1

## 2018-02-20 MED ORDER — SODIUM CHLORIDE 0.9 % IV SOLN
INTRAVENOUS | Status: DC
  Administered 2018-02-21 – 2018-02-22 (×2): via INTRAVENOUS

## 2018-02-20 MED ORDER — CIPROFLOXACIN IN D5W 400 MG/200ML IV SOLN
400.0000 mg | Freq: Two times a day (BID) | INTRAVENOUS | Status: DC
  Administered 2018-02-21 – 2018-02-27 (×13): 400 mg via INTRAVENOUS
  Filled 2018-02-20 (×13): qty 200

## 2018-02-20 MED ORDER — CIPROFLOXACIN IN D5W 400 MG/200ML IV SOLN
400.0000 mg | Freq: Once | INTRAVENOUS | Status: AC
  Administered 2018-02-20: 400 mg via INTRAVENOUS
  Filled 2018-02-20: qty 200

## 2018-02-20 MED ORDER — MAGNESIUM HYDROXIDE 400 MG/5ML PO SUSP: 15.0000 mL | Freq: Every day | ORAL | 0 refills | Status: DC | PRN

## 2018-02-20 MED ORDER — METRONIDAZOLE IN NACL 5-0.79 MG/ML-% IV SOLN
500.0000 mg | Freq: Three times a day (TID) | INTRAVENOUS | Status: DC
  Administered 2018-02-21 – 2018-02-27 (×19): 500 mg via INTRAVENOUS
  Filled 2018-02-20 (×20): qty 100

## 2018-02-20 MED ORDER — FLEET ENEMA 7-19 GM/118ML RE ENEM
1.0000 | ENEMA | Freq: Once | RECTAL | Status: AC
  Administered 2018-02-20: 1 via RECTAL

## 2018-02-20 MED ORDER — ENOXAPARIN SODIUM 40 MG/0.4ML ~~LOC~~ SOLN
40.0000 mg | SUBCUTANEOUS | Status: DC
  Administered 2018-02-20 – 2018-02-27 (×8): 40 mg via SUBCUTANEOUS
  Filled 2018-02-20 (×7): qty 0.4

## 2018-02-20 MED ORDER — ACETAMINOPHEN 650 MG RE SUPP: 650.0000 mg | Freq: Four times a day (QID) | RECTAL | Status: DC | PRN

## 2018-02-20 MED ORDER — HYDROMORPHONE HCL 1 MG/ML IJ SOLN
1.0000 mg | INTRAMUSCULAR | Status: DC | PRN
  Administered 2018-02-20 – 2018-03-11 (×22): 1 mg via INTRAVENOUS
  Filled 2018-02-20 (×24): qty 1

## 2018-02-20 MED ORDER — ONDANSETRON HCL 4 MG PO TABS: 4.0000 mg | ORAL_TABLET | Freq: Four times a day (QID) | ORAL | Status: DC | PRN

## 2018-02-20 MED ORDER — ONDANSETRON HCL 4 MG/2ML IJ SOLN: 4.0000 mg | Freq: Four times a day (QID) | INTRAMUSCULAR | Status: DC | PRN

## 2018-02-20 NOTE — ED Notes (Signed)
Dr Lama at bedside,  

## 2018-02-20 NOTE — ED Provider Notes (Signed)
[facility] EMERGENCY DEPARTMENT Provider Note   CSN: [id number] Arrival date & time: 02/20/18  1752     History   Chief Complaint Chief Complaint  Patient presents with  . Constipation    HPI Regina Hall is a 84 y.o. female.  HPI  The pt is an 84 y/o female - presents to the ER after having been seen in her PCP office earlier in the day for lower abdominal pain and constipation - no BM in 3 days (last BM was like "rocks").  She has had no urinary sx and no fevers, she has had some laxatives including miralax and milk of magnesia before coming in (which made her pain worse).  She has had a prior tubal ligation and a hysterectomy and within the last 6 months underwent a vascular procedure to place an inferior vena cava filter after developing a DVT of her left leg.  She did not tolerate the anticoagulants and developed a significant hematoma in her contralateral leg which prevented the ongoing use of blood thinners.  She has had subsequent problems with her leg swelling as well as having some wounds on her feet, she states that is doing well and they are healing up.  Her abdominal pain became worse after taking the laxatives, it is mostly in the lower abdomen in the left lower quadrant with some radiation around to the back.  Family doctor's recommendations of taking the laxative some have only made her pain worse that she is seeking a medical exam for further evaluation.  She denies fevers chills nausea or vomiting and denies any abdominal distention.  She does use occasional hydrocodone which the doctor gives her because of chronic pain but states it is only every now and again and less than once a week.  Past Medical History:  Diagnosis Date  . Anemia   . Anxiety    pt denies  . Arthritis    KNEES AND BACK  . Bladder incontinence   . Cancer ([place])    basal cell cancer on nose  . Edema of both feet   . Hypertension   . Pneumonia   . PONV (postoperative nausea and vomiting)    PONV X 1 EPISODE, COMES OUT OF ANESTHESIA FAST, SOME AWARENESS DURING END OF COLONSCOPY  . Scoliosis     Patient Active Problem List   Diagnosis Date Noted  . DDD (degenerative disc disease), lumbosacral 12/09/2017  . Impaired fasting glucose 12/09/2017  . DVT (deep venous thrombosis) ([place]) 06/04/2017  . Hematoma of right thigh 06/04/2017  . CKD (chronic kidney disease) stage 2, GFR 60-89 ml/min 06/04/2017  . Essential hypertension 06/04/2017  . Acute blood loss anemia 06/04/2017  . Chronic conjunctivitis 10/22/2016  . Chronic back pain 08/21/2016  . Scoliosis of lumbar spine 03/28/2015  . Primary osteoarthritis of right knee 04/27/2014  . Hip pain 02/17/2013  . History of total hip arthroplasty 09/09/2012    Past Surgical History:  Procedure Laterality Date  . ABDOMINAL HYSTERECTOMY     2006 VAG HYST  . BACK SURGERY     LOWER, X2  . BIOPSY Right 05/02/2017   Procedure: BIOPSY OF RIGHT UPPER EYELID LESION;  Surgeon: [name], MD;  Location: [place];  Service: Ophthalmology;  Laterality: Right;  . EYE SURGERY Bilateral    cataract surgery with lens implant  . JOINT REPLACEMENT     2011 LT HIP  . RECONSTRUCTION OF EYELID Right 05/02/2017   Procedure: TOTAL RECONSTRUCTION OF UPPER EYELID  RIGHT EYE WITH FULL THICKNESS SKIN GRAFT FROM RIGHT POSTERIOR EAR;  Surgeon: [name], MD;  Location: [place];  Service: Ophthalmology;  Laterality: Right;  . TONSILLECTOMY    . TOTAL KNEE ARTHROPLASTY Right 04/27/2014   Procedure: [place] TOTAL KNEE ARTHROPLASTY;  Surgeon: Rod Can, MD;  Location: [facility];  Service: Orthopedics;  Laterality: Right;  . TUBAL LIGATION     1974  . VENA CAVA FILTER PLACEMENT N/A 06/04/2017   Procedure: INSERTION VENA-CAVA FILTER;  Surgeon: Angelia Mould, MD;  Location: [facility] OR;  Service: Vascular;  Laterality: N/A;     OB History   No obstetric history on file.      Home Medications    Prior to Admission medications   Medication Sig  Start Date End Date Taking? Authorizing Provider  aspirin EC 81 MG tablet Take 81 mg by mouth daily.   Yes [provider]  Biotin 1 MG CAPS Take by mouth.   Yes [provider]  Cholecalciferol (VITAMIN D3) 5000 units TABS Take 5,000 Units by mouth daily.   Yes [provider]  CRANBERRY PO Take by mouth.   Yes [provider]  Docusate Sodium (STOOL SOFTENER LAXATIVE PO) Take 2 tablets by mouth every 6 (six) hours as needed.   Yes [provider]  furosemide (LASIX) 20 MG tablet Take 40mg  twice daily, at 6am and 2pm 01/28/18  Yes Eustaquio Maize, MD  GLUCOSAMINE SULFATE PO Take 2 tablets by mouth daily.    Yes [provider]  Hypromellose (ARTIFICIAL TEARS OP) Place 2 drops into both eyes daily as needed (for dry eyes).   Yes [provider]  losartan (COZAAR) 25 MG tablet TAKE 1 TABLET(25 MG) BY MOUTH EVERY MORNING 12/13/17  Yes Eustaquio Maize, MD  Magnesium 250 MG TABS Take by mouth.   Yes [provider]  magnesium hydroxide (MILK OF MAGNESIA) 400 MG/5ML suspension Take 15 mLs by mouth daily as needed for mild constipation. 02/20/18  Yes Dettinger, Fransisca Kaufmann, MD  montelukast (SINGULAIR) 10 MG tablet Take 10 mg by mouth every morning. 07/06/17  Yes [provider]  polyethylene glycol powder (GLYCOLAX/MIRALAX) powder Take 17 g by mouth 2 (two) times daily as needed. 02/20/18  Yes Dettinger, Fransisca Kaufmann, MD  vitamin B-12 (CYANOCOBALAMIN) 500 MCG tablet Take 500 mcg by mouth daily.   Yes [provider]  HYDROcodone-acetaminophen (NORCO) 10-325 MG tablet Take 1 tablet by mouth every 6 (six) hours as needed. 01/28/18   Eustaquio Maize, MD  mineral oil enema Place 133 mLs (1 enema total) rectally once for 1 dose. 02/20/18 02/20/18  Dettinger, Fransisca Kaufmann, MD    Family History Family History  Problem Relation Age of Onset  . Congestive Heart Failure Mother   . Colon cancer Father   . Cancer Brother   . Arthritis  Sister   . Asthma Sister   . Diabetes Son   . Diabetes Son     Social History Social History   Tobacco Use  . Smoking status: Never Smoker  . Smokeless tobacco: Never Used  Substance Use Topics  . Alcohol use: Yes    Comment: OCC WINE  . Drug use: No     Allergies   Penicillins and Vancomycin   Review of Systems Review of Systems  All other systems reviewed and are negative.    Physical Exam Updated Vital Signs BP (!) 136/54   Pulse 97   Temp 98.9 F (37.2 C) (  Oral)   Resp 20   SpO2 98%   Physical Exam Vitals signs and nursing note reviewed.  Constitutional:      General: She is not in acute distress.    Appearance: She is well-developed.  HENT:     Head: Normocephalic and atraumatic.     Mouth/Throat:     Pharynx: No oropharyngeal exudate.  Eyes:     General: No scleral icterus.       Right eye: No discharge.        Left eye: No discharge.     Conjunctiva/sclera: Conjunctivae normal.     Pupils: Pupils are equal, round, and reactive to light.  Neck:     Musculoskeletal: Normal range of motion and neck supple.     Thyroid: No thyromegaly.     Vascular: No JVD.  Cardiovascular:     Rate and Rhythm: Regular rhythm.     Heart sounds: Normal heart sounds. No murmur. No friction rub. No gallop.      Comments: Pulse of 103, normal radial artery pulses, mild edema bilateral LE's. Pulmonary:     Effort: Pulmonary effort is normal. No respiratory distress.     Breath sounds: Normal breath sounds. No wheezing or rales.  Abdominal:     General: Bowel sounds are normal. There is no distension.     Palpations: Abdomen is soft. There is no mass.     Tenderness: There is abdominal tenderness ( LLQ and SP area.).  Musculoskeletal: Normal range of motion.        General: No tenderness.     Right lower leg: Edema present.     Left lower leg: Edema present.  Lymphadenopathy:     Cervical: No cervical adenopathy.  Skin:    General: Skin is warm and dry.      Findings: No erythema or rash.  Neurological:     Mental Status: She is alert.     Coordination: Coordination normal.  Psychiatric:        Behavior: Behavior normal.      ED Treatments / Results  Labs (all labs ordered are listed, but only abnormal results are displayed) Labs Reviewed  CBC - Abnormal; Notable for the following components:      Result Value   WBC 11.8 (*)    All other components within normal limits  COMPREHENSIVE METABOLIC PANEL - Abnormal; Notable for the following components:   Glucose, Bld 145 (*)    Creatinine, Ser 1.06 (*)    Total Protein 6.3 (*)    GFR calc non Af Amer 49 (*)    GFR calc Af Amer 57 (*)    All other components within normal limits  URINALYSIS, ROUTINE W REFLEX MICROSCOPIC    EKG None  Radiology Ct Abdomen Pelvis W Contrast  Result Date: 02/20/2018 CLINICAL DATA:  Lower abdominal pain and constipation for the past 2 days. Clinical suspicion for diverticulitis. EXAM: CT ABDOMEN AND PELVIS WITH CONTRAST TECHNIQUE: Multidetector CT imaging of the abdomen and pelvis was performed using the standard protocol following bolus administration of intravenous contrast. CONTRAST:  179mL OMNIPAQUE IOHEXOL 300 MG/ML  SOLN COMPARISON:  Abdomen radiographs dated 02/06/2018. Abdomen and pelvis CT dated 04/08/2015. FINDINGS: Lower chest: Unremarkable. Hepatobiliary: No significant change in previously demonstrated liver cysts. Small number of tiny gallstones in the gallbladder measuring up to 4 mm in maximum diameter each. No gallbladder wall thickening or pericholecystic fluid. Pancreas: Unremarkable. No pancreatic ductal dilatation or surrounding inflammatory changes. Spleen: 5 mm oval  area of low density in the spleen on image number 29 series 2, difficult to detect with certainty on the previous examination, partly due to differences in technique and timing. Adrenals/Urinary Tract: Normal appearing adrenal glands. Stable tiny exophytic upper pole left renal  cyst. Normal appearing right kidney, ureters and urinary bladder. Stomach/Bowel: Large amount of stool throughout the colon to the level of the distal sigmoid colon where there is a short segment of concentric, medium density wall thickening, best seen on image number 72 series 2. The wall thickening is concentric and lower in density on coronal image number 90. Lesser amount of stool and gas in the normal caliber rectum. Minimal sigmoid colon diverticulosis. Mild pericolonic soft tissue stranding involving the distal sigmoid colon, most pronounced at the level of maximal distension of the colon by the stool. Normal appearing stomach, small bowel and appendix. Vascular/Lymphatic: Atheromatous arterial calcifications without aneurysm. No enlarged lymph nodes. Inferior vena cava filter with its tip just above the level of the renal veins. Reproductive: Status post hysterectomy. No adnexal masses. Other: Small amount of free peritoneal fluid in the pelvis. Musculoskeletal: Left hip prosthesis. Severe right hip degenerative changes with severe joint space narrowing, extensive subarticular cyst formation, spur formation and bony remodeling. Moderate levoconvex thoracolumbar scoliosis. Interbody and pedicle screw and rod fusion at the L4-5 level. Fusion of portions of the T12 and L1 vertebral bodies. Lumbar and lower thoracic spine degenerative changes. IMPRESSION: 1. Large amount of stool throughout the colon to the level of the distal sigmoid colon where there is a short segment of concentric, medium density wall thickening. This is concerning for an obstructing short segment mass. It would be unusual for edema due to diverticulitis to involve that short of a segment of bowel. Correlation with sigmoidoscopy or colonoscopy is recommended. 2. Mild pericolonic soft tissue stranding involving the distal sigmoid colon, most pronounced at the level of maximal distension of the colon by the stool. This is suspicious for mild  diverticulitis without abscess. There is minimal diverticulosis in that region of the colon. 3. Cholelithiasis. 4. 5 mm probable hemangioma in the spleen. Electronically Signed   By: Claudie Revering M.D.   On: 02/20/2018 20:59    Procedures Procedures (including critical care time)  Medications Ordered in ED Medications  ciprofloxacin (CIPRO) IVPB 400 mg (has no administration in time range)  metroNIDAZOLE (FLAGYL) tablet 500 mg (has no administration in time range)  HYDROmorphone (DILAUDID) injection 1 mg (1 mg Intravenous Given 02/20/18 1858)  iohexol (OMNIPAQUE) 300 MG/ML solution 100 mL (100 mLs Intravenous Contrast Given 02/20/18 2020)     Initial Impression / Assessment and Plan / ED Course  I have reviewed the triage vital signs and the nursing notes.  Pertinent labs & imaging results that were available during my care of the patient were reviewed by me and considered in my medical decision making (see chart for details).    The patient has a non surgical abdomen - she states that she has had no hx of colonoscopy abnormalities - but would consider diverticulitis - possible obstruction - but most likely constipation - CT pending, labs pending, dose of pain medicine given.  Leukocytosis present, renal function baseline at 1.06, urinalysis is in process  I have personally looked at the CT scan, there is an extreme amount of stool throughout the colon and what appears to be a partially or completely obstructing mass in the distal colon with some thickened sigmoid wall which is circumferential.  I discussed  this with the on-call gastroenterologist, Dr. Gala Romney who has been kind enough to offer his assistance with seeing the patient in the morning and recommends no more oral laxatives or stool softeners but only to use enemas over the night.  He recommends and agrees with treating for diverticulitis and consulting with hospitalist for admission.  Discussed with Dr. Darrick Meigs who will admit  Final  Clinical Impressions(s) / ED Diagnoses   Final diagnoses:  Diverticulitis of large intestine without perforation or abscess without bleeding  Large bowel obstruction ([place])  Abnormal CT scan, sigmoid colon      Noemi Chapel, MD 02/20/18 2121

## 2018-02-20 NOTE — H&P (Signed)
TRH H&P    Patient Demographics:    Regina Hall, is a 84 y.o. female  MRN: [id number]  DOB - 06-02-1934  Admit Date - 02/20/2018  Referring MD/NP/PA: Noemi Chapel  Outpatient Primary MD for the patient is [name], DO  Patient coming from: Home  Chief complaint-abdominal pain   HPI:    Regina Hall  is a 84 y.o. female, With history of hypertension, scoliosis, recent DVT of left leg, not on anticoagulation due to development of hematoma in contralateral leg with subsequent IVC filter placement, basal cell cancer on the nose, bladder incontinence came to hospital after patient developed constipation and abdominal pain for past 3 days.  Patient took laxative at home including MiraLAX and milk of magnesia which made her pain worse.  Her PCP recommendation was that if laxative did not help then go to the ED for further evaluation.  Patient has been using occasional hydrocodone for her chronic back pain issues.  But has not used it recently. She denies nausea and vomiting. Denies chest pain or shortness of breath. Denies fever or chills. Denies dysuria. Denies blurred vision, headache. No previous history of stroke or seizures.  In the ED, CT scan showedLarge amount of stool throughout the colon to the level of the distal sigmoid colon where there is short segment of concentric mid intensity wall thickening.  Concerning for obstructing short segment mass.  Mild pericolonic soft tissue stranding involving distal sigmoid colon most pronounced at the level of maximal distention of the colon by the stool.  Suspicious for mild diverticulitis without abscess. Gastroenterology was consulted, recommended enema for tonight.  N.p.o. after midnight.  Possible sigmoidoscopy/colonoscopy in a.m.  Patient also started on IV ceftriaxone and Flagyl.   Review of systems:    In addition to the HPI above,    All other  systems reviewed and are negative.    Past History of the following :    Past Medical History:  Diagnosis Date  . Anemia   . Anxiety    pt denies  . Arthritis    KNEES AND BACK  . Bladder incontinence   . Cancer ([place])    basal cell cancer on nose  . Edema of both feet   . Hypertension   . Pneumonia   . PONV (postoperative nausea and vomiting)    PONV X 1 EPISODE, COMES OUT OF ANESTHESIA FAST, SOME AWARENESS DURING END OF COLONSCOPY  . Scoliosis       Past Surgical History:  Procedure Laterality Date  . ABDOMINAL HYSTERECTOMY     2006 VAG HYST  . BACK SURGERY     LOWER, X2  . BIOPSY Right 05/02/2017   Procedure: BIOPSY OF RIGHT UPPER EYELID LESION;  Surgeon: [name], MD;  Location: [place];  Service: Ophthalmology;  Laterality: Right;  . EYE SURGERY Bilateral    cataract surgery with lens implant  . JOINT REPLACEMENT     2011 LT HIP  . RECONSTRUCTION OF EYELID Right 05/02/2017   Procedure: TOTAL RECONSTRUCTION OF UPPER EYELID  RIGHT EYE WITH FULL THICKNESS SKIN GRAFT FROM RIGHT POSTERIOR EAR;  Surgeon: [name], MD;  Location: [place];  Service: Ophthalmology;  Laterality: Right;  . TONSILLECTOMY    . TOTAL KNEE ARTHROPLASTY Right 04/27/2014   Procedure: [place] TOTAL KNEE ARTHROPLASTY;  Surgeon: Rod Can, MD;  Location: [facility];  Service: Orthopedics;  Laterality: Right;  . TUBAL LIGATION     1974  . VENA CAVA FILTER PLACEMENT N/A 06/04/2017   Procedure: INSERTION VENA-CAVA FILTER;  Surgeon: Angelia Mould, MD;  Location: [facility];  Service: Vascular;  Laterality: N/A;      Social History:      Social History   Tobacco Use  . Smoking status: Never Smoker  . Smokeless tobacco: Never Used  Substance Use Topics  . Alcohol use: Yes    Comment: OCC WINE       Family History :     Family History  Problem Relation Age of Onset  . Congestive Heart Failure Mother   . Colon cancer Father   . Cancer Brother   . Arthritis Sister   .  Asthma Sister   . Diabetes Son   . Diabetes Son       Home Medications:   Prior to Admission medications   Medication Sig Start Date End Date Taking? Authorizing Provider  aspirin EC 81 MG tablet Take 81 mg by mouth daily.   Yes [provider]  Biotin 1 MG CAPS Take by mouth.   Yes [provider]  Cholecalciferol (VITAMIN D3) 5000 units TABS Take 5,000 Units by mouth daily.   Yes [provider]  CRANBERRY PO Take by mouth.   Yes [provider]  Docusate Sodium (STOOL SOFTENER LAXATIVE PO) Take 2 tablets by mouth every 6 (six) hours as needed.   Yes [provider]  furosemide (LASIX) 20 MG tablet Take 40mg  twice daily, at 6am and 2pm 01/28/18  Yes Eustaquio Maize, MD  GLUCOSAMINE SULFATE PO Take 2 tablets by mouth daily.    Yes [provider]  Hypromellose (ARTIFICIAL TEARS OP) Place 2 drops into both eyes daily as needed (for dry eyes).   Yes [provider]  losartan (COZAAR) 25 MG tablet TAKE 1 TABLET(25 MG) BY MOUTH EVERY MORNING 12/13/17  Yes Eustaquio Maize, MD  Magnesium 250 MG TABS Take by mouth.   Yes [provider]  magnesium hydroxide (MILK OF MAGNESIA) 400 MG/5ML suspension Take 15 mLs by mouth daily as needed for mild constipation. 02/20/18  Yes Dettinger, Fransisca Kaufmann, MD  montelukast (SINGULAIR) 10 MG tablet Take 10 mg by mouth every morning. 07/06/17  Yes [provider]  polyethylene glycol powder (GLYCOLAX/MIRALAX) powder Take 17 g by mouth 2 (two) times daily as needed. 02/20/18  Yes Dettinger, Fransisca Kaufmann, MD  vitamin B-12 (CYANOCOBALAMIN) 500 MCG tablet Take 500 mcg by mouth daily.   Yes [provider]  HYDROcodone-acetaminophen (NORCO) 10-325 MG tablet Take 1 tablet by mouth every 6 (six) hours as needed. 01/28/18   Eustaquio Maize, MD  mineral oil enema Place 133 mLs (1 enema total) rectally once for 1 dose. 02/20/18 02/20/18  Dettinger, Fransisca Kaufmann, MD     Allergies:     Allergies    Allergen Reactions  . Penicillins Rash and Other (See Comments)    Has patient had a PCN reaction causing immediate rash, facial/tongue/throat swelling, SOB or lightheadedness with hypotension: ###Yes## Has patient had a PCN reaction causing severe rash involving  mucus membranes or skin necrosis: No Has patient had a PCN reaction that required hospitalization No Has patient had a PCN reaction occurring within the last 10 years: No If all of the above answers are "NO", then may proceed with Cephalosporin use.   . Vancomycin     Itching, SOB      Physical Exam:   Vitals  Blood pressure (!) 136/54, pulse 97, temperature 98.9 F (37.2 C), temperature source Oral, resp. rate 20, SpO2 98 %.  1.  General: Appears in no acute distress  2. Psychiatric: Alert, oriented x3, intact insight and judgment  3. Neurologic: Cranial nerve II through XII grossly intact, motor strength 5/5 in all extremities  4. HEENMT:  Atraumatic normocephalic, extraocular muscles intact, oral mucosa pink and moist.  5. Respiratory : Clear to auscultation bilaterally  6. Cardiovascular : S1-S2, regular, no murmur auscultated  7. Gastrointestinal:  Abdomen is soft, bowel sounds hyperactive, tender to palpation generalized, no rigidity or guarding, no organomegaly  8. Skin:  No rashes noted  9.Musculoskeletal:  Lower extremities are edematous, 2+ nonpitting edema    Data Review:    CBC Recent Labs  Lab 02/20/18 1905  WBC 11.8*  HGB 12.6  HCT 40.4  PLT 187  MCV 95.7  MCH 29.9  MCHC 31.2  RDW 13.0   ------------------------------------------------------------------------------------------------------------------  Results for orders placed or performed during the hospital encounter of 02/20/18 (from the past 48 hour(s))  CBC     Status: Abnormal   Collection Time: 02/20/18  7:05 PM  Result Value Ref Range   WBC 11.8 (H) 4.0 - 10.5 K/uL   RBC 4.22 3.87 - 5.11 MIL/uL   Hemoglobin 12.6  12.0 - 15.0 g/dL   HCT 40.4 36.0 - 46.0 %   MCV 95.7 80.0 - 100.0 fL   MCH 29.9 26.0 - 34.0 pg   MCHC 31.2 30.0 - 36.0 g/dL   RDW 13.0 11.5 - 15.5 %   Platelets 187 150 - 400 K/uL   nRBC 0.0 0.0 - 0.2 %    Comment: Performed at [facility], [address], [place], [place] [address]  Comprehensive metabolic panel     Status: Abnormal   Collection Time: 02/20/18  7:05 PM  Result Value Ref Range   Sodium 138 135 - 145 mmol/L   Potassium 3.6 3.5 - 5.1 mmol/L   Chloride 101 98 - 111 mmol/L   CO2 29 22 - 32 mmol/L   Glucose, Bld 145 (H) 70 - 99 mg/dL   BUN 23 8 - 23 mg/dL   Creatinine, Ser 1.06 (H) 0.44 - 1.00 mg/dL   Calcium 9.1 8.9 - 10.3 mg/dL   Total Protein 6.3 (L) 6.5 - 8.1 g/dL   Albumin 3.5 3.5 - 5.0 g/dL   AST 15 15 - 41 U/L   ALT 12 0 - 44 U/L   Alkaline Phosphatase 77 38 - 126 U/L   Total Bilirubin 0.8 0.3 - 1.2 mg/dL   GFR calc non Af Amer 49 (L) >60 mL/min   GFR calc Af Amer 57 (L) >60 mL/min   Anion gap 8 5 - 15    Comment: Performed at [facility], [address], [place], [place] [address]  Urinalysis, Routine w reflex microscopic     Status: Abnormal   Collection Time: 02/20/18  8:45 PM  Result Value Ref Range   Color, Urine YELLOW YELLOW   APPearance CLOUDY (A) CLEAR   Specific Gravity, Urine 1.020 1.005 - 1.030  pH 8.0 5.0 - 8.0   Glucose, UA NEGATIVE NEGATIVE mg/dL   Hgb urine dipstick SMALL (A) NEGATIVE   Bilirubin Urine NEGATIVE NEGATIVE   Ketones, ur NEGATIVE NEGATIVE mg/dL   Protein, ur NEGATIVE NEGATIVE mg/dL   Nitrite NEGATIVE NEGATIVE   Leukocytes, UA NEGATIVE NEGATIVE   WBC, UA 0-5 0 - 5 WBC/hpf   Bacteria, UA RARE (A) NONE SEEN   Squamous Epithelial / LPF 0-5 0 - 5   Mucus PRESENT    Amorphous Crystal PRESENT     Comment: Performed at [facility], [address], [place], [place] [address]    Chemistries  Recent Labs  Lab 02/20/18 1905  NA 138  K 3.6  CL 101  CO2 29  GLUCOSE 145*  BUN 23  CREATININE 1.06*  CALCIUM 9.1  AST 15    ALT 12  ALKPHOS 77  BILITOT 0.8   ------------------------------------------------------------------------------------------------------------------  ------------------------------------------------------------------------------------------------------------------ GFR: CrCl cannot be calculated (Unknown ideal weight.). Liver Function Tests: Recent Labs  Lab 02/20/18 1905  AST 15  ALT 12  ALKPHOS 77  BILITOT 0.8  PROT 6.3*  ALBUMIN 3.5    --------------------------------------------------------------------------------------------------------------- Urine analysis:    Component Value Date/Time   COLORURINE YELLOW 02/20/2018 2045   APPEARANCEUR CLOUDY (A) 02/20/2018 2045   LABSPEC 1.020 02/20/2018 2045   PHURINE 8.0 02/20/2018 2045   GLUCOSEU NEGATIVE 02/20/2018 2045   HGBUR SMALL (A) 02/20/2018 2045   BILIRUBINUR NEGATIVE 02/20/2018 2045   KETONESUR NEGATIVE 02/20/2018 2045   PROTEINUR NEGATIVE 02/20/2018 2045   UROBILINOGEN 0.2 04/21/2014 1200   NITRITE NEGATIVE 02/20/2018 2045   LEUKOCYTESUR NEGATIVE 02/20/2018 2045      Imaging Results:    Ct Abdomen Pelvis W Contrast  Result Date: 02/20/2018 CLINICAL DATA:  Lower abdominal pain and constipation for the past 2 days. Clinical suspicion for diverticulitis. EXAM: CT ABDOMEN AND PELVIS WITH CONTRAST TECHNIQUE: Multidetector CT imaging of the abdomen and pelvis was performed using the standard protocol following bolus administration of intravenous contrast. CONTRAST:  151mL OMNIPAQUE IOHEXOL 300 MG/ML  SOLN COMPARISON:  Abdomen radiographs dated 02/06/2018. Abdomen and pelvis CT dated 04/08/2015. FINDINGS: Lower chest: Unremarkable. Hepatobiliary: No significant change in previously demonstrated liver cysts. Small number of tiny gallstones in the gallbladder measuring up to 4 mm in maximum diameter each. No gallbladder wall thickening or pericholecystic fluid. Pancreas: Unremarkable. No pancreatic ductal dilatation or  surrounding inflammatory changes. Spleen: 5 mm oval area of low density in the spleen on image number 29 series 2, difficult to detect with certainty on the previous examination, partly due to differences in technique and timing. Adrenals/Urinary Tract: Normal appearing adrenal glands. Stable tiny exophytic upper pole left renal cyst. Normal appearing right kidney, ureters and urinary bladder. Stomach/Bowel: Large amount of stool throughout the colon to the level of the distal sigmoid colon where there is a short segment of concentric, medium density wall thickening, best seen on image number 72 series 2. The wall thickening is concentric and lower in density on coronal image number 90. Lesser amount of stool and gas in the normal caliber rectum. Minimal sigmoid colon diverticulosis. Mild pericolonic soft tissue stranding involving the distal sigmoid colon, most pronounced at the level of maximal distension of the colon by the stool. Normal appearing stomach, small bowel and appendix. Vascular/Lymphatic: Atheromatous arterial calcifications without aneurysm. No enlarged lymph nodes. Inferior vena cava filter with its tip just above the level of the renal veins. Reproductive: Status post hysterectomy. No adnexal masses. Other: Small amount of  free peritoneal fluid in the pelvis. Musculoskeletal: Left hip prosthesis. Severe right hip degenerative changes with severe joint space narrowing, extensive subarticular cyst formation, spur formation and bony remodeling. Moderate levoconvex thoracolumbar scoliosis. Interbody and pedicle screw and rod fusion at the L4-5 level. Fusion of portions of the T12 and L1 vertebral bodies. Lumbar and lower thoracic spine degenerative changes. IMPRESSION: 1. Large amount of stool throughout the colon to the level of the distal sigmoid colon where there is a short segment of concentric, medium density wall thickening. This is concerning for an obstructing short segment mass. It would be  unusual for edema due to diverticulitis to involve that short of a segment of bowel. Correlation with sigmoidoscopy or colonoscopy is recommended. 2. Mild pericolonic soft tissue stranding involving the distal sigmoid colon, most pronounced at the level of maximal distension of the colon by the stool. This is suspicious for mild diverticulitis without abscess. There is minimal diverticulosis in that region of the colon. 3. Cholelithiasis. 4. 5 mm probable hemangioma in the spleen. Electronically Signed   By: Claudie Revering M.D.   On: 02/20/2018 20:59       Assessment & Plan:    Active Problems:   Mass of colon   1. Colon mass/obstruction-CT scan showed mass in sigmoid colon, leading to obstruction with large amount of stool in the colon.  Given fleets enema x1 in the ED.  Gastroenterology to see in a.m.  Likely sigmoidoscopy in a.m.  We will keep her n.p.o., gentle IV hydration with normal saline at 50 mL/h, pain management with Dilaudid 1 mg every 4 hours as needed.  2. Mild diverticulitis-CT also suspicious for mild diverticulitis.  Started on Cipro and Flagyl.  IV fluids as above.  3. History of DVT-not on anticoagulation due to development of hematoma in right leg.  Status post IVC filter.  4. Bilateral lower extremity edema-secondary to chronic venous insufficiency, patient was on Lasix which is currently on hold.  Keep legs elevated.  5. Hypertension-we will hold antihypertensive medication at this time.  Start hydralazine 10 mg IV every 4 hours as needed.    DVT Prophylaxis-   Lovenox   AM Labs Ordered, also please review Full Orders  Family Communication: Admission, patients condition and plan of care including tests being ordered have been discussed with the patient and her husband at bedside* who indicate understanding and agree with the plan and Code Status.  Code Status: Full code  Admission status: Inpatient: Based on patients clinical presentation and evaluation of above  clinical data, I have made determination that patient meets Inpatient criteria at this time.  Patient will need more than 2 midnight stay in the hospital.  Has large bowel obstruction, questionable mass.  Time spent in minutes : 60 minutes   Oswald Hillock M.D on 02/20/2018 at 9:51 PM

## 2018-02-20 NOTE — ED Notes (Signed)
Patient transported to CT 

## 2018-02-20 NOTE — Progress Notes (Signed)
BP (!) 143/78   Pulse 97   Temp 97.8 F (36.6 C) (Oral)    Subjective:    Patient ID: Regina Hall, female    DOB: 1934/04/20, 84 y.o.   MRN: [id number]  HPI: Regina Hall is a 84 y.o. female presenting on 02/20/2018 for Constipation (x 2 days) and Abdominal Pain   HPI Constipation and lower abdominal pain Patient comes in today complaining of constipation or lower abdominal pain is been going on for the past 2 days.  She says that she has been gradually be getting more constipated her last bowel movement was 3 days ago and at that point she had to strain a lot and it was just a lot of hard pebbles and she had trouble even getting that out and she has not had a bowel movement since then.  She says she is still passing gas as of yesterday and just more complains about lower abdominal pain on that left side especially.  She denies any fevers or chills or blood in her stool.  She denies any diarrhea.  She denies any nausea or vomiting although she has had decreased appetite because of the fullness in her belly.  She rates the pain as moderate in intensity.  She denies it radiating anywhere else except for on that side and to her back some.  She has been using prune juice and Metamucil which have not helped to this point.  Relevant past medical, surgical, family and social history reviewed and updated as indicated. Interim medical history since our last visit reviewed. Allergies and medications reviewed and updated.  Review of Systems  Constitutional: Negative for chills and fever.  Eyes: Negative for visual disturbance.  Respiratory: Negative for chest tightness and shortness of breath.   Cardiovascular: Negative for chest pain and leg swelling.  Gastrointestinal: Positive for abdominal pain and constipation.  Musculoskeletal: Negative for back pain and gait problem.  Skin: Negative for rash.  Neurological: Negative for light-headedness and headaches.  Psychiatric/Behavioral: Negative  for agitation and behavioral problems.  All other systems reviewed and are negative.   Per HPI unless specifically indicated above        Objective:    BP (!) 143/78   Pulse 97   Temp 97.8 F (36.6 C) (Oral)   Wt Readings from Last 3 Encounters:  01/29/18 214 lb (97.1 kg)  12/19/17 228 lb (103.4 kg)  12/09/17 221 lb 9.6 oz (100.5 kg)    Physical Exam Vitals signs and nursing note reviewed.  Constitutional:      General: She is not in acute distress.    Appearance: She is well-developed. She is not diaphoretic.  Abdominal:     General: Abdomen is flat. Bowel sounds are normal. There is no distension.     Tenderness: There is abdominal tenderness (Lower and left-sided abdominal pain, no rebound or guarding) in the suprapubic area and left lower quadrant. There is no right CVA tenderness, left CVA tenderness, guarding or rebound.  Musculoskeletal: Normal range of motion.        General: No tenderness.  Skin:    General: Skin is warm and dry.     Findings: No rash.  Neurological:     Mental Status: She is alert and oriented to person, place, and time.     Coordination: Coordination normal.  Psychiatric:        Behavior: Behavior normal.         Assessment & Plan:   Problem List  Items Addressed This Visit    None    Visit Diagnoses    Constipation, unspecified constipation type    -  Primary   Relevant Medications   magnesium hydroxide (MILK OF MAGNESIA) 400 MG/5ML suspension   polyethylene glycol powder (GLYCOLAX/MIRALAX) powder   mineral oil enema       Follow up plan: Return if symptoms worsen or fail to improve.  Counseling provided for all of the vaccine components No orders of the defined types were placed in this encounter.   Caryl Pina, MD [facility] 02/20/2018, 11:36 AM

## 2018-02-20 NOTE — ED Triage Notes (Signed)
No BM in 3 days.  Took milk of mag and miralax last night with no results.

## 2018-02-21 ENCOUNTER — Encounter (HOSPITAL_COMMUNITY)
Admission: EM | Disposition: A | Discharge status: Skilled Nursing Facility | Payer: Self-pay | Source: Home / Self Care | Attending: Internal Medicine

## 2018-02-21 ENCOUNTER — Other Ambulatory Visit: Payer: Self-pay

## 2018-02-21 DIAGNOSIS — K56609 Unspecified intestinal obstruction, unspecified as to partial versus complete obstruction: Secondary | ICD-10-CM

## 2018-02-21 DIAGNOSIS — K5909 Other constipation: Secondary | ICD-10-CM

## 2018-02-21 DIAGNOSIS — K5732 Diverticulitis of large intestine without perforation or abscess without bleeding: Secondary | ICD-10-CM

## 2018-02-21 DIAGNOSIS — R933 Abnormal findings on diagnostic imaging of other parts of digestive tract: Secondary | ICD-10-CM

## 2018-02-21 DIAGNOSIS — K5900 Constipation, unspecified: Secondary | ICD-10-CM

## 2018-02-21 HISTORY — PX: POLYPECTOMY: SHX5525

## 2018-02-21 HISTORY — PX: FLEXIBLE SIGMOIDOSCOPY: SHX5431

## 2018-02-21 LAB — COMPREHENSIVE METABOLIC PANEL
ALT: 9 U/L (ref 0–44)
ANION GAP: 9 (ref 5–15)
AST: 12 U/L — ABNORMAL LOW (ref 15–41)
Albumin: 2.9 g/dL — ABNORMAL LOW (ref 3.5–5.0)
Alkaline Phosphatase: 62 U/L (ref 38–126)
BUN: 21 mg/dL (ref 8–23)
CO2: 28 mmol/L (ref 22–32)
Calcium: 8.7 mg/dL — ABNORMAL LOW (ref 8.9–10.3)
Chloride: 103 mmol/L (ref 98–111)
Creatinine, Ser: 0.97 mg/dL (ref 0.44–1.00)
GFR calc Af Amer: >60 mL/min (ref >60)
GFR calc non Af Amer: 55 mL/min — ABNORMAL LOW (ref >60)
Glucose, Bld: 111 mg/dL — ABNORMAL HIGH (ref 70–99)
Potassium: 3.4 mmol/L — ABNORMAL LOW (ref 3.5–5.1)
Sodium: 140 mmol/L (ref 135–145)
Total Bilirubin: 0.9 mg/dL (ref 0.3–1.2)
Total Protein: 5.3 g/dL — ABNORMAL LOW (ref 6.5–8.1)

## 2018-02-21 LAB — CBC
HCT: 36.3 % (ref 36.0–46.0)
Hemoglobin: 11.7 g/dL — ABNORMAL LOW (ref 12.0–15.0)
MCH: 31.4 pg (ref 26.0–34.0)
MCHC: 32.2 g/dL (ref 30.0–36.0)
MCV: 97.3 fL (ref 80.0–100.0)
Platelets: 166 10*3/uL (ref 150–400)
RBC: 3.73 MIL/uL — ABNORMAL LOW (ref 3.87–5.11)
RDW: 12.9 % (ref 11.5–15.5)
WBC: 11.7 10*3/uL — ABNORMAL HIGH (ref 4.0–10.5)
nRBC: 0 % (ref 0.0–0.2)

## 2018-02-21 SURGERY — SIGMOIDOSCOPY, FLEXIBLE
Anesthesia: Moderate Sedation

## 2018-02-21 MED ORDER — MEPERIDINE HCL 100 MG/ML IJ SOLN
INTRAMUSCULAR | Status: DC | PRN
  Administered 2018-02-21: 25 mg via INTRAVENOUS

## 2018-02-21 MED ORDER — MIDAZOLAM HCL 5 MG/5ML IJ SOLN
INTRAMUSCULAR | Status: DC | PRN
  Administered 2018-02-21: 1 mg via INTRAVENOUS

## 2018-02-21 MED ORDER — ONDANSETRON HCL 4 MG/2ML IJ SOLN
INTRAMUSCULAR | Status: AC
  Filled 2018-02-21: qty 2

## 2018-02-21 MED ORDER — SODIUM CHLORIDE 0.9 % IV SOLN
INTRAVENOUS | Status: DC
  Administered 2018-02-21: 14:00:00 via INTRAVENOUS

## 2018-02-21 MED ORDER — ONDANSETRON HCL 4 MG/2ML IJ SOLN
INTRAMUSCULAR | Status: DC | PRN
  Administered 2018-02-21: 4 mg via INTRAVENOUS

## 2018-02-21 MED ORDER — MIDAZOLAM HCL 5 MG/5ML IJ SOLN
INTRAMUSCULAR | Status: AC
  Filled 2018-02-21: qty 5

## 2018-02-21 MED ORDER — MEPERIDINE HCL 50 MG/ML IJ SOLN
INTRAMUSCULAR | Status: AC
  Filled 2018-02-21: qty 1

## 2018-02-21 MED ORDER — HYDRALAZINE HCL 20 MG/ML IJ SOLN: 10.0000 mg | INTRAMUSCULAR | Status: DC | PRN

## 2018-02-21 MED ORDER — STERILE WATER FOR IRRIGATION IR SOLN
Status: DC | PRN
  Administered 2018-02-21: 15:00:00

## 2018-02-21 NOTE — Consult Note (Addendum)
Referring Provider: [facility] Primary Care Physician:  [name], DO Primary Gastroenterologist:  Dr. Gala Romney (previously unassigned)  Date of Admission: 02/20/2018 Date of Consultation: 02/21/2018  Reason for Consultation:  Near-obstructing colon mass  HPI:  Regina Hall is a 84 y.o. female with a past medical history of basal cell carcinoma of the nose, anemia, anxiety, hypertension, pneumonia, PONV, recent DVT of the left leg not on anticoagulation due to hematoma in contralateral leg with subsequent IVC filter placement.  Emergency room physician provider note and hospitalist H&P reviewed.  She presented to the hospital for constipation and abdominal pain for the previous 3 days despite home laxatives including MiraLAX and milk of magnesia, which made her pain worse.  Primary care recommended referral by the ER.  Occasional hydrocodone for back pain but not recently.  No nausea, vomiting, fever.  In the emergency department a CT scan showed a large amount of stool throughout the colon to the level of the distal sigmoid colon with a short segment of concentric mid intensity wall thickening concerning for obstructing short segment mass, mild pericolonic soft tissue stranding involving the distal sigmoid colon most pronounced at the level of maximum distention of the colon by the stool suspicious for mild diverticulitis without abscess.  GI was consulted and recommended enema tonight and n.p.o. after midnight.  Possible sigmoidoscopy/colonoscopy in the morning.  She was also admitted and started on IV Rocephin and Flagyl.  Confirmed fleets enema was given last night at 2147.  Today she states she has had very mild constipation previously which is previously been well managed with prunes, fiber.  She generally does not like taking medications.  However, 3 days ago she was having worsening constipation and abdominal pain.  She saw her primary care provider and they recommended her try  MiraLAX and milk of magnesia and when this did not help her have a bowel movement and essentially made her abdominal pain worse they recommended she come to the emergency department.  She states she has had previous colonoscopies at [facility] (now [facility]).  She cannot remember exactly when her last one was but thinks it might of been around 3 years ago.  At that time they told her she did not have to have further colonoscopies.  We will request these records.  She denies any hematochezia or melena.  No fever, nausea, vomiting.  Her abdominal pain is somewhat improved with pain medication.  No other GI complaints at this time.  She did state her last colonoscopy they could not sedate her very well.  She is on hydrocodone for chronic pain.  Past Medical History:  Diagnosis Date  . Anemia   . Anxiety    pt denies  . Arthritis    KNEES AND BACK  . Bladder incontinence   . Cancer ([place])    basal cell cancer on nose  . Edema of both feet   . Hypertension   . Pneumonia   . PONV (postoperative nausea and vomiting)    PONV X 1 EPISODE, COMES OUT OF ANESTHESIA FAST, SOME AWARENESS DURING END OF COLONSCOPY  . Scoliosis     Past Surgical History:  Procedure Laterality Date  . ABDOMINAL HYSTERECTOMY     2006 VAG HYST  . BACK SURGERY     LOWER, X2  . BIOPSY Right 05/02/2017   Procedure: BIOPSY OF RIGHT UPPER EYELID LESION;  Surgeon: [name], MD;  Location: [facility];  Service: Ophthalmology;  Laterality: Right;  .  EYE SURGERY Bilateral    cataract surgery with lens implant  . JOINT REPLACEMENT     2011 LT HIP  . RECONSTRUCTION OF EYELID Right 05/02/2017   Procedure: TOTAL RECONSTRUCTION OF UPPER EYELID RIGHT EYE WITH FULL THICKNESS SKIN GRAFT FROM RIGHT POSTERIOR EAR;  Surgeon: [name], MD;  Location: [facility];  Service: Ophthalmology;  Laterality: Right;  . TONSILLECTOMY    . TOTAL KNEE ARTHROPLASTY Right 04/27/2014   Procedure: [place] TOTAL KNEE  ARTHROPLASTY;  Surgeon: Rod Can, MD;  Location: [facility];  Service: Orthopedics;  Laterality: Right;  . TUBAL LIGATION     1974  . VENA CAVA FILTER PLACEMENT N/A 06/04/2017   Procedure: INSERTION VENA-CAVA FILTER;  Surgeon: Angelia Mould, MD;  Location: [facility] OR;  Service: Vascular;  Laterality: N/A;    Prior to Admission medications   Medication Sig Start Date End Date Taking? Authorizing Provider  aspirin EC 81 MG tablet Take 81 mg by mouth daily.   Yes [provider]  Biotin 1 MG CAPS Take by mouth.   Yes [provider]  Cholecalciferol (VITAMIN D3) 5000 units TABS Take 5,000 Units by mouth daily.   Yes [provider]  CRANBERRY PO Take by mouth.   Yes [provider]  Docusate Sodium (STOOL SOFTENER LAXATIVE PO) Take 2 tablets by mouth every 6 (six) hours as needed.   Yes [provider]  furosemide (LASIX) 20 MG tablet Take 10m twice daily, at 6am and 2pm 01/28/18  Yes VEustaquio Maize MD  GLUCOSAMINE SULFATE PO Take 2 tablets by mouth daily.    Yes [provider]  Hypromellose (ARTIFICIAL TEARS OP) Place 2 drops into both eyes daily as needed (for dry eyes).   Yes [provider]  losartan (COZAAR) 25 MG tablet TAKE 1 TABLET(25 MG) BY MOUTH EVERY MORNING 12/13/17  Yes VEustaquio Maize MD  Magnesium 250 MG TABS Take by mouth.   Yes [provider]  magnesium hydroxide (MILK OF MAGNESIA) 400 MG/5ML suspension Take 15 mLs by mouth daily as needed for mild constipation. 02/20/18  Yes Dettinger, JFransisca Kaufmann MD  montelukast (SINGULAIR) 10 MG tablet Take 10 mg by mouth every morning. 07/06/17  Yes [provider]  polyethylene glycol powder (GLYCOLAX/MIRALAX) powder Take 17 g by mouth 2 (two) times daily as needed. 02/20/18  Yes Dettinger, JFransisca Kaufmann MD  vitamin B-12 (CYANOCOBALAMIN) 500 MCG tablet Take 500 mcg by mouth daily.   Yes [provider]  HYDROcodone-acetaminophen (NORCO) 10-325 MG  tablet Take 1 tablet by mouth every 6 (six) hours as needed. 01/28/18   VEustaquio Maize MD    Current Facility-Administered Medications  Medication Dose Route Frequency Provider Last Rate Last Dose  . 0.9 %  sodium chloride infusion   Intravenous Continuous LOswald Hillock MD 50 mL/hr at 02/21/18 0419    . acetaminophen (TYLENOL) tablet 650 mg  650 mg Oral Q6H PRN LOswald Hillock MD       Or  . acetaminophen (TYLENOL) suppository 650 mg  650 mg Rectal Q6H PRN LOswald Hillock MD      . ciprofloxacin (CIPRO) IVPB 400 mg  400 mg Intravenous Q12H LOswald Hillock MD 200 mL/hr at 02/21/18 0801 400 mg at 02/21/18 0801  . enoxaparin (LOVENOX) injection 40 mg  40 mg Subcutaneous Q24H LOswald Hillock MD   40 mg at 02/20/18 2228  . HYDROmorphone (DILAUDID) injection 1 mg  1 mg Intravenous Q4H PRN LIraq  Marge Duncans, MD   1 mg at 02/20/18 2213  . metroNIDAZOLE (FLAGYL) IVPB 500 mg  500 mg Intravenous Q8H Oswald Hillock, MD 100 mL/hr at 02/21/18 0419 500 mg at 02/21/18 0419  . ondansetron (ZOFRAN) tablet 4 mg  4 mg Oral Q6H PRN Oswald Hillock, MD       Or  . ondansetron (ZOFRAN) injection 4 mg  4 mg Intravenous Q6H PRN Oswald Hillock, MD       Facility-Administered Medications Ordered in Other Encounters  Medication Dose Route Frequency Provider Last Rate Last Dose  . ondansetron (ZOFRAN) 4 mg in sodium chloride 0.9 % 50 mL IVPB  4 mg Intravenous Once Rod Can, MD        Allergies as of 02/20/2018 - Review Complete 02/20/2018  Allergen Reaction Noted  . Penicillins Rash and Other (See Comments) 07/17/2011  . Vancomycin  01/28/2018    Family History  Problem Relation Age of Onset  . Congestive Heart Failure Mother   . Colon cancer Father   . Cancer Brother   . Arthritis Sister   . Asthma Sister   . Diabetes Son   . Diabetes Son     Social History   Socioeconomic History  . Marital status: Married    Spouse name: Not on file  . Number of children: 3  . Years of education: 42  . Highest  education level: Associate degree: occupational, Hotel manager, or vocational program  Occupational History  . Occupation: Retired    Comment: Corporate treasurer  Social Needs  . Financial resource strain: Somewhat hard  . Food insecurity:    Worry: Never true    Inability: Never true  . Transportation needs:    Medical: No    Non-medical: No  Tobacco Use  . Smoking status: Never Smoker  . Smokeless tobacco: Never Used  Substance and Sexual Activity  . Alcohol use: Yes    Comment: OCC WINE  . Drug use: No  . Sexual activity: Not on file  Lifestyle  . Physical activity:    Days per week: 0 days    Minutes per session: 0 min  . Stress: Only a little  Relationships  . Social connections:    Talks on phone: More than three times a week    Gets together: Once a week    Attends religious service: Never    Active member of club or organization: No    Attends meetings of clubs or organizations: Never    Relationship status: Married  . Intimate partner violence:    Fear of current or ex partner: No    Emotionally abused: No    Physically abused: No    Forced sexual activity: No  Other Topics Concern  . Not on file  Social History Narrative  . Not on file    Review of Systems: General: Negative for anorexia, weight loss, fever, chills, fatigue, weakness. ENT: Negative for hoarseness, difficulty swallowing. CV: Negative for chest pain, angina, palpitations, peripheral edema.  Respiratory: Negative for dyspnea at rest, cough, sputum, wheezing.  GI: See history of present illness. MS: Chronic pain.  Derm: Negative for rash or itching.  Endo: Negative for unusual weight change.  Heme: Negative for bruising or bleeding. Allergy: Negative for rash or hives.  Physical Exam: Vital signs in last 24 hours: Temp:  [97.8 F (36.6 C)-99.5 F (37.5 C)] 99.5 F (37.5 C) (02/07 0541) Pulse Rate:  [89-103] 92 (02/07 0541) Resp:  [16-20] 20 (02/07 0541)  BP: (104-155)/(48-93) 116/48  (02/07 0541) SpO2:  [94 %-98 %] 97 % (02/07 0541) Weight:  [110.2 kg] 110.2 kg (02/07 0222) Last BM Date: 02/18/18 General:   Alert,  Well-developed, well-nourished, pleasant and cooperative in NAD Head:  Normocephalic and atraumatic. Eyes:  Sclera clear, no icterus. Conjunctiva pink. Ears:  Normal auditory acuity. Neck:  Supple; no masses or thyromegaly. Lungs:  Clear throughout to auscultation. No wheezes, crackles, or rhonchi. No acute distress. Heart:  Regular rate and rhythm; no murmurs, clicks, rubs,  or gallops. Abdomen:  Soft, and nondistended. Moderate generalized abdominal TTP, worse right-sided. No masses, hepatosplenomegaly or hernias noted. Normal bowel sounds, without guarding, and without rebound.   Rectal:  Deferred until time of colonoscopy.   Msk:  Symmetrical without gross deformities. Pulses:  Normal bilateral DP pulses noted. Extremities:  Without clubbing. Noted 1-2+ pittent edema bilateral LE. Neurologic:  Alert and  oriented x4;  grossly normal neurologically. Skin:  Intact without significant lesions or rashes. Psych:  Alert and cooperative. Normal mood and affect.  Intake/Output from previous day: 02/06 0701 - 02/07 0700 In: 436.3 [I.V.:336.3; IV Piggyback:100] Out: -  Intake/Output this shift: No intake/output data recorded.  Lab Results: Recent Labs    02/20/18 1905 02/21/18 0458  WBC 11.8* 11.7*  HGB 12.6 11.7*  HCT 40.4 36.3  PLT 187 166   BMET Recent Labs    02/20/18 1905 02/21/18 0458  NA 138 140  K 3.6 3.4*  CL 101 103  CO2 29 28  GLUCOSE 145* 111*  BUN 23 21  CREATININE 1.06* 0.97  CALCIUM 9.1 8.7*   LFT Recent Labs    02/20/18 1905 02/21/18 0458  PROT 6.3* 5.3*  ALBUMIN 3.5 2.9*  AST 15 12*  ALT 12 9  ALKPHOS 77 62  BILITOT 0.8 0.9   PT/INR No results for input(s): LABPROT, INR in the last 72 hours. Hepatitis Panel No results for input(s): HEPBSAG, HCVAB, HEPAIGM, HEPBIGM in the last 72 hours. C-Diff No results for  input(s): CDIFFTOX in the last 72 hours.  Studies/Results: Ct Abdomen Pelvis W Contrast  Result Date: 02/20/2018 CLINICAL DATA:  Lower abdominal pain and constipation for the past 2 days. Clinical suspicion for diverticulitis. EXAM: CT ABDOMEN AND PELVIS WITH CONTRAST TECHNIQUE: Multidetector CT imaging of the abdomen and pelvis was performed using the standard protocol following bolus administration of intravenous contrast. CONTRAST:  157m OMNIPAQUE IOHEXOL 300 MG/ML  SOLN COMPARISON:  Abdomen radiographs dated 02/06/2018. Abdomen and pelvis CT dated 04/08/2015. FINDINGS: Lower chest: Unremarkable. Hepatobiliary: No significant change in previously demonstrated liver cysts. Small number of tiny gallstones in the gallbladder measuring up to 4 mm in maximum diameter each. No gallbladder wall thickening or pericholecystic fluid. Pancreas: Unremarkable. No pancreatic ductal dilatation or surrounding inflammatory changes. Spleen: 5 mm oval area of low density in the spleen on image number 29 series 2, difficult to detect with certainty on the previous examination, partly due to differences in technique and timing. Adrenals/Urinary Tract: Normal appearing adrenal glands. Stable tiny exophytic upper pole left renal cyst. Normal appearing right kidney, ureters and urinary bladder. Stomach/Bowel: Large amount of stool throughout the colon to the level of the distal sigmoid colon where there is a short segment of concentric, medium density wall thickening, best seen on image number 72 series 2. The wall thickening is concentric and lower in density on coronal image number 90. Lesser amount of stool and gas in the normal caliber rectum. Minimal sigmoid colon diverticulosis. Mild pericolonic soft  tissue stranding involving the distal sigmoid colon, most pronounced at the level of maximal distension of the colon by the stool. Normal appearing stomach, small bowel and appendix. Vascular/Lymphatic: Atheromatous arterial  calcifications without aneurysm. No enlarged lymph nodes. Inferior vena cava filter with its tip just above the level of the renal veins. Reproductive: Status post hysterectomy. No adnexal masses. Other: Small amount of free peritoneal fluid in the pelvis. Musculoskeletal: Left hip prosthesis. Severe right hip degenerative changes with severe joint space narrowing, extensive subarticular cyst formation, spur formation and bony remodeling. Moderate levoconvex thoracolumbar scoliosis. Interbody and pedicle screw and rod fusion at the L4-5 level. Fusion of portions of the T12 and L1 vertebral bodies. Lumbar and lower thoracic spine degenerative changes. IMPRESSION: 1. Large amount of stool throughout the colon to the level of the distal sigmoid colon where there is a short segment of concentric, medium density wall thickening. This is concerning for an obstructing short segment mass. It would be unusual for edema due to diverticulitis to involve that short of a segment of bowel. Correlation with sigmoidoscopy or colonoscopy is recommended. 2. Mild pericolonic soft tissue stranding involving the distal sigmoid colon, most pronounced at the level of maximal distension of the colon by the stool. This is suspicious for mild diverticulitis without abscess. There is minimal diverticulosis in that region of the colon. 3. Cholelithiasis. 4. 5 mm probable hemangioma in the spleen. Electronically Signed   By: Claudie Revering M.D.   On: 02/20/2018 20:59    Impression: Pleasant 84 year old female with a history of mild constipation generally managed with prunes and other fiber.  She began having worsening constipation about 3 days ago with significant abdominal pain.  She was recommended to try MiraLAX and milk of magnesia by her primary care.  Not only does not help her have a better bowel movement because worsening abdominal pain.  They subsequently brought her to the emergency room.  Her pain is better managed today on pain  medication.  She has not had a bowel movement.  CT is concerning for mid-sigmoid colonic wall thickening that is obstructing or near obstructing.    The patient said previous colonoscopies have been unremarkable and thinks her last one was about 3 years ago and she was recommended not have any further colonoscopies.  Her last colonoscopy was completed and she stated she was not sedated well.  She is amendable to flexible sigmoidoscopy versus colonoscopy.  Proceed with Flex Sig with Dr. Gala Romney in near future: the risks, benefits, and alternatives have been discussed with the patient in detail. The patient states understanding and desires to proceed.  Given previous poor sedation and chronic pain medications will discuss likely need for propofol/MAC, although this may not be necessary given the limited flex sig.  Plan: 1. Management per hospitalist 2. Remain n.p.o. 3. Plan for flexible sigmoidoscopy today for further evaluation 4. We will request records from [facility] (now [facility]) with the unit secretary 5. Supportive measures 6. Tap water enema x 2 prior to flex sig   Thank you for allowing [place] to participate in the care of Maysen A Enedina Finner, DNP, AGNP-C Adult & Gerontological Nurse Practitioner [facility]    LOS: 1 day     02/21/2018, 8:07 AM

## 2018-02-21 NOTE — Care Management (Signed)
CM consulted for home health/equipment. In discussion during rounds, patient will have flex sig today and potentially will need surgical consult. Anticipate will be here through weekend. CM will follow for needs.

## 2018-02-21 NOTE — Progress Notes (Signed)
Paged MD via [place] regarding plan for post colonoscopy. Pt asking to eat/drink. Awaiting callback.

## 2018-02-21 NOTE — ED Notes (Signed)
ED TO INPATIENT HANDOFF REPORT  ED Nurse Name and Phone #:   Name/Age/Gender Regina Hall 84 y.o. female Room/Bed: APA19/APA19  Code Status   Code Status: Full Code  Home/SNF/Other home {Patient oriented to person, place, time  Is this baseline? yes  Triage Complete: Triage complete  Chief Complaint Abdominal Pain  Triage Note No BM in 3 days.  Took milk of mag and miralax last night with no results.   Allergies Allergies  Allergen Reactions  . Penicillins Rash and Other (See Comments)    Has patient had a PCN reaction causing immediate rash, facial/tongue/throat swelling, SOB or lightheadedness with hypotension: ###Yes## Has patient had a PCN reaction causing severe rash involving mucus membranes or skin necrosis: No Has patient had a PCN reaction that required hospitalization No Has patient had a PCN reaction occurring within the last 10 years: No If all of the above answers are "NO", then may proceed with Cephalosporin use.   . Vancomycin     Itching, SOB     Level of Care/Admitting Diagnosis ED Disposition    ED Disposition Condition [facility] Area: [facility] [[id number]]  Level of Care: Med-Surg [16]  Diagnosis: Mass of colon [595638]  Admitting Physician: Oswald Hillock [[id number]]  Attending Physician: Oswald Hillock [[id number]]  Estimated length of stay: 3 - 4 days  Certification:: I certify this patient will need inpatient services for at least 2 midnights  PT Class (Do Not Modify): Inpatient [101]  PT Acc Code (Do Not Modify): Private [1]       Medical/Surgery History Past Medical History:  Diagnosis Date  . Anemia   . Anxiety    pt denies  . Arthritis    KNEES AND BACK  . Bladder incontinence   . Cancer ([place])    basal cell cancer on nose  . Edema of both feet   . Hypertension   . Pneumonia   . PONV (postoperative nausea and vomiting)    PONV X 1 EPISODE, COMES OUT OF ANESTHESIA FAST, SOME AWARENESS DURING END OF COLONSCOPY   . Scoliosis    Past Surgical History:  Procedure Laterality Date  . ABDOMINAL HYSTERECTOMY     2006 VAG HYST  . BACK SURGERY     LOWER, X2  . BIOPSY Right 05/02/2017   Procedure: BIOPSY OF RIGHT UPPER EYELID LESION;  Surgeon: [name], MD;  Location: [facility];  Service: Ophthalmology;  Laterality: Right;  . EYE SURGERY Bilateral    cataract surgery with lens implant  . JOINT REPLACEMENT     2011 LT HIP  . RECONSTRUCTION OF EYELID Right 05/02/2017   Procedure: TOTAL RECONSTRUCTION OF UPPER EYELID RIGHT EYE WITH FULL THICKNESS SKIN GRAFT FROM RIGHT POSTERIOR EAR;  Surgeon: [name], MD;  Location: [facility];  Service: Ophthalmology;  Laterality: Right;  . TONSILLECTOMY    . TOTAL KNEE ARTHROPLASTY Right 04/27/2014   Procedure: [place] TOTAL KNEE ARTHROPLASTY;  Surgeon: Rod Can, MD;  Location: [facility];  Service: Orthopedics;  Laterality: Right;  . TUBAL LIGATION     1974  . VENA CAVA FILTER PLACEMENT N/A 06/04/2017   Procedure: INSERTION VENA-CAVA FILTER;  Surgeon: Angelia Mould, MD;  Location[facility];  Service: Vascular;  Laterality: N/A;     IV Location/Drains/Wounds Patient Lines/Drains/Airways Status   Active Line/Drains/Airways    Name:   Placement date:   Placement time:   Site:   Days:   Peripheral IV 02/20/18  Right Antecubital   02/20/18    1856    Antecubital   1   External Urinary Catheter   02/20/18    2232    -   1   Incision (Closed) 04/27/14 Leg Right   04/27/14    1014     1396   Incision (Closed) 03/28/15 Back   03/28/15    1312     1061   Incision (Closed) 05/02/17 Eye Right   05/02/17    1211     295   Incision (Closed) 05/02/17 Ear Right   05/02/17    1223     295   Incision (Closed) 06/04/17 Groin Right   06/04/17    1640     262          Intake/Output Last 24 hours No intake or output data in the 24 hours ending 02/21/18 0123  Labs/Imaging Results for orders placed or performed during the hospital encounter of 02/20/18 (from the  past 48 hour(s))  CBC     Status: Abnormal   Collection Time: 02/20/18  7:05 PM  Result Value Ref Range   WBC 11.8 (H) 4.0 - 10.5 K/uL   RBC 4.22 3.87 - 5.11 MIL/uL   Hemoglobin 12.6 12.0 - 15.0 g/dL   HCT 40.4 36.0 - 46.0 %   MCV 95.7 80.0 - 100.0 fL   MCH 29.9 26.0 - 34.0 pg   MCHC 31.2 30.0 - 36.0 g/dL   RDW 13.0 11.5 - 15.5 %   Platelets 187 150 - 400 K/uL   nRBC 0.0 0.0 - 0.2 %    Comment: Performed at [facility], [address], [place], [place] [address]  Comprehensive metabolic panel     Status: Abnormal   Collection Time: 02/20/18  7:05 PM  Result Value Ref Range   Sodium 138 135 - 145 mmol/L   Potassium 3.6 3.5 - 5.1 mmol/L   Chloride 101 98 - 111 mmol/L   CO2 29 22 - 32 mmol/L   Glucose, Bld 145 (H) 70 - 99 mg/dL   BUN 23 8 - 23 mg/dL   Creatinine, Ser 1.06 (H) 0.44 - 1.00 mg/dL   Calcium 9.1 8.9 - 10.3 mg/dL   Total Protein 6.3 (L) 6.5 - 8.1 g/dL   Albumin 3.5 3.5 - 5.0 g/dL   AST 15 15 - 41 U/L   ALT 12 0 - 44 U/L   Alkaline Phosphatase 77 38 - 126 U/L   Total Bilirubin 0.8 0.3 - 1.2 mg/dL   GFR calc non Af Amer 49 (L) >60 mL/min   GFR calc Af Amer 57 (L) >60 mL/min   Anion gap 8 5 - 15    Comment: Performed at [facility], [address], [place], [place] [address]  Urinalysis, Routine w reflex microscopic     Status: Abnormal   Collection Time: 02/20/18  8:45 PM  Result Value Ref Range   Color, Urine YELLOW YELLOW   APPearance CLOUDY (A) CLEAR   Specific Gravity, Urine 1.020 1.005 - 1.030   pH 8.0 5.0 - 8.0   Glucose, UA NEGATIVE NEGATIVE mg/dL   Hgb urine dipstick SMALL (A) NEGATIVE   Bilirubin Urine NEGATIVE NEGATIVE   Ketones, ur NEGATIVE NEGATIVE mg/dL   Protein, ur NEGATIVE NEGATIVE mg/dL   Nitrite NEGATIVE NEGATIVE   Leukocytes, UA NEGATIVE NEGATIVE   WBC, UA 0-5 0 - 5 WBC/hpf   Bacteria, UA RARE (A) NONE SEEN   Squamous Epithelial / LPF  0-5 0 - 5   Mucus PRESENT    Amorphous Crystal PRESENT     Comment: Performed at [facility],  [address], [place], [place] [address]   Ct Abdomen Pelvis W Contrast  Result Date: 02/20/2018 CLINICAL DATA:  Lower abdominal pain and constipation for the past 2 days. Clinical suspicion for diverticulitis. EXAM: CT ABDOMEN AND PELVIS WITH CONTRAST TECHNIQUE: Multidetector CT imaging of the abdomen and pelvis was performed using the standard protocol following bolus administration of intravenous contrast. CONTRAST:  164mL OMNIPAQUE IOHEXOL 300 MG/ML  SOLN COMPARISON:  Abdomen radiographs dated 02/06/2018. Abdomen and pelvis CT dated 04/08/2015. FINDINGS: Lower chest: Unremarkable. Hepatobiliary: No significant change in previously demonstrated liver cysts. Small number of tiny gallstones in the gallbladder measuring up to 4 mm in maximum diameter each. No gallbladder wall thickening or pericholecystic fluid. Pancreas: Unremarkable. No pancreatic ductal dilatation or surrounding inflammatory changes. Spleen: 5 mm oval area of low density in the spleen on image number 29 series 2, difficult to detect with certainty on the previous examination, partly due to differences in technique and timing. Adrenals/Urinary Tract: Normal appearing adrenal glands. Stable tiny exophytic upper pole left renal cyst. Normal appearing right kidney, ureters and urinary bladder. Stomach/Bowel: Large amount of stool throughout the colon to the level of the distal sigmoid colon where there is a short segment of concentric, medium density wall thickening, best seen on image number 72 series 2. The wall thickening is concentric and lower in density on coronal image number 90. Lesser amount of stool and gas in the normal caliber rectum. Minimal sigmoid colon diverticulosis. Mild pericolonic soft tissue stranding involving the distal sigmoid colon, most pronounced at the level of maximal distension of the colon by the stool. Normal appearing stomach, small bowel and appendix. Vascular/Lymphatic: Atheromatous arterial calcifications without  aneurysm. No enlarged lymph nodes. Inferior vena cava filter with its tip just above the level of the renal veins. Reproductive: Status post hysterectomy. No adnexal masses. Other: Small amount of free peritoneal fluid in the pelvis. Musculoskeletal: Left hip prosthesis. Severe right hip degenerative changes with severe joint space narrowing, extensive subarticular cyst formation, spur formation and bony remodeling. Moderate levoconvex thoracolumbar scoliosis. Interbody and pedicle screw and rod fusion at the L4-5 level. Fusion of portions of the T12 and L1 vertebral bodies. Lumbar and lower thoracic spine degenerative changes. IMPRESSION: 1. Large amount of stool throughout the colon to the level of the distal sigmoid colon where there is a short segment of concentric, medium density wall thickening. This is concerning for an obstructing short segment mass. It would be unusual for edema due to diverticulitis to involve that short of a segment of bowel. Correlation with sigmoidoscopy or colonoscopy is recommended. 2. Mild pericolonic soft tissue stranding involving the distal sigmoid colon, most pronounced at the level of maximal distension of the colon by the stool. This is suspicious for mild diverticulitis without abscess. There is minimal diverticulosis in that region of the colon. 3. Cholelithiasis. 4. 5 mm probable hemangioma in the spleen. Electronically Signed   By: Claudie Revering M.D.   On: 02/20/2018 20:59    Pending Labs Unresulted Labs (From admission, onward)    Start     Ordered   02/27/18 0500  Creatinine, serum  (enoxaparin (LOVENOX)    CrCl >/= 30 ml/min)  Weekly,   R    Comments:  while on enoxaparin therapy    02/20/18 2204   02/21/18 0500  CBC  Tomorrow morning,  R     02/20/18 2204   02/21/18 0500  Comprehensive metabolic panel  Tomorrow morning,   R     02/20/18 2204          Vitals/Pain Today's Vitals   02/20/18 2324 02/21/18 0000 02/21/18 0039 02/21/18 0119  BP:  125/65   130/63  Pulse:  95  89  Resp:  18  18  Temp:   98.7 F (37.1 C)   TempSrc:   Oral   SpO2:  96%  95%  PainSc: Asleep       Isolation Precautions No active isolations  Medications Medications  enoxaparin (LOVENOX) injection 40 mg (40 mg Subcutaneous Given 02/20/18 2228)  0.9 %  sodium chloride infusion ( Intravenous Rate/Dose Change 02/20/18 2223)  ondansetron (ZOFRAN) tablet 4 mg (has no administration in time range)    Or  ondansetron (ZOFRAN) injection 4 mg (has no administration in time range)  acetaminophen (TYLENOL) tablet 650 mg (has no administration in time range)    Or  acetaminophen (TYLENOL) suppository 650 mg (has no administration in time range)  HYDROmorphone (DILAUDID) injection 1 mg (1 mg Intravenous Given 02/20/18 2213)  ciprofloxacin (CIPRO) IVPB 400 mg (has no administration in time range)  metroNIDAZOLE (FLAGYL) IVPB 500 mg (has no administration in time range)  HYDROmorphone (DILAUDID) injection 1 mg (1 mg Intravenous Given 02/20/18 1858)  iohexol (OMNIPAQUE) 300 MG/ML solution 100 mL (100 mLs Intravenous Contrast Given 02/20/18 2020)  ciprofloxacin (CIPRO) IVPB 400 mg (0 mg Intravenous Stopped 02/20/18 2335)  metroNIDAZOLE (FLAGYL) tablet 500 mg (500 mg Oral Given 02/20/18 2147)  sodium phosphate (FLEET) 7-19 GM/118ML enema 1 enema (1 enema Rectal Given 02/20/18 2147)    Mobility {Mobility: walks High fall risk   Focused Assessments    Recommendations: See Admitting Provider Note  Report given to:   Additional Notes:

## 2018-02-21 NOTE — Op Note (Signed)
[facility] Patient Name: Regina Hall Procedure Date: 02/21/2018 2:42 PM MRN: [id number] Date of Birth: 1934-11-14 Attending MD: Norvel Richards , MD CSN: [id number] Age: 84 Admit Type: Inpatient Procedure:                Flexible Sigmoidoscopy Indications:              Abnormal CT of the GI tract Providers:                Norvel Richards, MD, [place] B. Gwenlyn Perking RN, RN,                            Nelma Rothman, Technician Referring MD:              Medicines:                Midazolam 2 mg IV, Meperidine 25 mg IV, Ondansetron                            4 mg IV Complications:            No immediate complications. Estimated Blood Loss:     Estimated blood loss was minimal. Procedure:                Pre-Anesthesia Assessment:                           - Prior to the procedure, a History and Physical                            was performed, and patient medications and                            allergies were reviewed. The patient's tolerance of                            previous anesthesia was also reviewed. The risks                            and benefits of the procedure and the sedation                            options and risks were discussed with the patient.                            All questions were answered, and informed consent                            was obtained. Prior Anticoagulants: The patient                            last took Lovenox (enoxaparin) on the day of the                            procedure. ASA Grade Assessment: IV - A patient  with severe systemic disease that is a constant                            threat to life. After reviewing the risks and                            benefits, the patient was deemed in satisfactory                            condition to undergo the procedure.                           After obtaining informed consent, the scope was                            passed under direct vision. The  [id number] ([id number])                            scope was introduced through the anus and advanced                            to the the sigmoid colon. The flexible                            sigmoidoscopy was accomplished without difficulty.                            The patient tolerated the procedure well. The                            quality of the bowel preparation was adequate. Scope In: 2:53:07 PM Scope Out: 2:59:57 PM Total Procedure Duration: 0 hours 6 minutes 50 seconds  Findings:      (1) 5 mm pedunculated polyp in at 4 cm from anal verge. The remainder       the rectal mucosa appeared normal on?"face and retroflexed. Scope was       advanced 25 cm from the anal verge where the lumen was collapsed and       markedly edematous. I did not see an infiltrating/neoplastic process. I       was able to nudge the nose the scope a little ways into the compressed       lumen and noted that the compromise lumen upstream was occluded with       stool. Again, I was not able to identify a neoplastic process. There was       marked edema compromising the the lumen at this level. The rectal polyp       was cold snare/removed. Impression:               -Obstruction of the sigmoid colon at 25 cm as                            described above. Marked edema. Inflammatory process  and/or malignancy not excluded Moderate Sedation:      Moderate (conscious) sedation was administered by the endoscopy nurse       and supervised by the endoscopist. The following parameters were       monitored: oxygen saturation, heart rate, blood pressure, respiratory       rate, EKG, adequacy of pulmonary ventilation, and response to care.       Total physician intraservice time was 8 minutes. Recommendation:           Return patient to her room for ongoing care.                            Continue Cipro and Flagyl empirically. Surgery                            consultation (discussed  with Dr. Roderic Palau). Limit diet                            to clear liquids for the time being. Procedure Code(s):        --- Professional ---                           [phone], Sigmoidoscopy, flexible; diagnostic,                            including collection of specimen(s) by brushing or                            washing, when performed (separate procedure) Diagnosis Code(s):        --- Professional ---                           R93.3, Abnormal findings on diagnostic imaging of                            other parts of digestive tract CPT copyright 2018 [employer]. All rights reserved. The codes documented in this report are preliminary and upon coder review may  be revised to meet current compliance requirements. Cristopher Estimable. Courteny Egler, MD Norvel Richards, MD 02/21/2018 3:17:08 PM This report has been signed electronically. Number of Addenda: 0

## 2018-02-21 NOTE — Progress Notes (Signed)
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  CT scan of the abdomen pelvis indicated possible diverticulitis with large amount of stool throughout the colon and concern for underlying colon obstruction.  She was admitted to the hospital and seen by GI.  She underwent sigmoidoscopy with results as below.  Plan is for surgical consult and continued IV antibiotics   Assessment & Plan:   Active Problems:   Mass of colon   Abnormal CT scan, sigmoid colon   Diverticulitis of large intestine without perforation or abscess without bleeding   Large bowel obstruction (HCC)   Constipation   1. Large bowel obstruction.  Patient was seen by GI and underwent sigmoidoscopy.  Noted to have obstruction in the sigmoid colon with marked edema.  Recommendations were for surgical consult.  Continue on clear liquids for now.  Continue on intravenous antibiotics. 2. Possible diverticulitis noted on CT scan.  Currently on ciprofloxacin and Flagyl. 3. History of DVT in the past.  Not on anticoagulation due to development of hematoma the right leg.  She is status post IVC filter. 4. Chronic bilateral lower extremity edema.  Likely related to venous insufficiency.  She normally wears TED hose.  At this point, she feels that her swelling is better than what it normally is.  She is chronically on Lasix which is currently on hold.  Keep legs elevated. 5. Hypertension.  Antihypertensives are currently on hold.  Using hydralazine as needed.   DVT prophylaxis: Lovenox Code Status: Full code Family Communication: No family present Disposition Plan: Discharge home once work-up is complete   Consultants:   Gastroenterology  Procedures:  Sigmoidoscopy:-Obstruction of the sigmoid colon  at 25 cm as                            described above. Marked edema. Inflammatory process                             and/or malignancy not excluded  Antimicrobials:   Ciprofloxacin 2/6 >  Flagyl 2/6 >   Subjective: Continues to feel some abdominal pain.  No shortness of breath.  No chest pain.  No nausea or vomiting  Objective: Vitals:   02/21/18 1500 02/21/18 1505 02/21/18 1510 02/21/18 1530  BP: (!) 95/54 (!) 77/64 (!) 114/55 (!) 120/59  Pulse: 94 94 89 90  Resp: (!) 22 (!) 23 (!) 21 17  Temp:    98.8 F (37.1 C)  TempSrc:    Oral  SpO2: 95% 96% 97% 96%  Weight:      Height:        Intake/Output Summary (Last 24 hours) at 02/21/2018 1946 Last data filed at 02/21/2018 1700 Gross per 24 hour  Intake 556.34 ml  Output -  Net 556.34 ml   Filed Weights   02/21/18 0222  Weight: 110.2 kg    Examination:  General exam: Appears calm and comfortable  Respiratory system: Clear to auscultation. Respiratory effort normal. Cardiovascular system: S1 & S2 heard, RRR. No JVD, murmurs, rubs, gallops or clicks.  2+, chronic edema in the lower extremities Gastrointestinal system: Abdomen is nondistended, soft and diffusely tender, more so on the lower  abdomen. No organomegaly or masses felt. Normal bowel sounds heard. Central nervous system: Alert and oriented. No focal neurological deficits. Extremities: Symmetric 5 x 5 power. Skin: No rashes, lesions or ulcers Psychiatry: Judgement and insight appear normal. Mood & affect appropriate.     Data Reviewed: I have personally reviewed following labs and imaging studies  CBC: Recent Labs  Lab 02/20/18 1905 02/21/18 0458  WBC 11.8* 11.7*  HGB 12.6 11.7*  HCT 40.4 36.3  MCV 95.7 97.3  PLT 187 983   Basic Metabolic Panel: Recent Labs  Lab 02/20/18 1905 02/21/18 0458  NA 138 140  K 3.6 3.4*  CL 101 103  CO2 29 28  GLUCOSE 145* 111*  BUN 23 21  CREATININE 1.06* 0.97  CALCIUM 9.1 8.7*   GFR: Estimated Creatinine  Clearance: 57.2 mL/min (by C-G formula based on SCr of 0.97 mg/dL). Liver Function Tests: Recent Labs  Lab 02/20/18 1905 02/21/18 0458  AST 15 12*  ALT 12 9  ALKPHOS 77 62  BILITOT 0.8 0.9  PROT 6.3* 5.3*  ALBUMIN 3.5 2.9*   No results for input(s): LIPASE, AMYLASE in the last 168 hours. No results for input(s): AMMONIA in the last 168 hours. Coagulation Profile: No results for input(s): INR, PROTIME in the last 168 hours. Cardiac Enzymes: No results for input(s): CKTOTAL, CKMB, CKMBINDEX, TROPONINI in the last 168 hours. BNP (last 3 results) No results for input(s): PROBNP in the last 8760 hours. HbA1C: No results for input(s): HGBA1C in the last 72 hours. CBG: No results for input(s): GLUCAP in the last 168 hours. Lipid Profile: No results for input(s): CHOL, HDL, LDLCALC, TRIG, CHOLHDL, LDLDIRECT in the last 72 hours. Thyroid Function Tests: No results for input(s): TSH, T4TOTAL, FREET4, T3FREE, THYROIDAB in the last 72 hours. Anemia Panel: No results for input(s): VITAMINB12, FOLATE, FERRITIN, TIBC, IRON, RETICCTPCT in the last 72 hours. Sepsis Labs: No results for input(s): PROCALCITON, LATICACIDVEN in the last 168 hours.  No results found for this or any previous visit (from the past 240 hour(s)).       Radiology Studies: Ct Abdomen Pelvis W Contrast  Result Date: 02/20/2018 CLINICAL DATA:  Lower abdominal pain and constipation for the past 2 days. Clinical suspicion for diverticulitis. EXAM: CT ABDOMEN AND PELVIS WITH CONTRAST TECHNIQUE: Multidetector CT imaging of the abdomen and pelvis was performed using the standard protocol following bolus administration of intravenous contrast. CONTRAST:  174mL OMNIPAQUE IOHEXOL 300 MG/ML  SOLN COMPARISON:  Abdomen radiographs dated 02/06/2018. Abdomen and pelvis CT dated 04/08/2015. FINDINGS: Lower chest: Unremarkable. Hepatobiliary: No significant change in previously demonstrated liver cysts. Small number of tiny gallstones  in the gallbladder measuring up to 4 mm in maximum diameter each. No gallbladder wall thickening or pericholecystic fluid. Pancreas: Unremarkable. No pancreatic ductal dilatation or surrounding inflammatory changes. Spleen: 5 mm oval area of low density in the spleen on image number 29 series 2, difficult to detect with certainty on the previous examination, partly due to differences in technique and timing. Adrenals/Urinary Tract: Normal appearing adrenal glands. Stable tiny exophytic upper pole left renal cyst. Normal appearing right kidney, ureters and urinary bladder. Stomach/Bowel: Large amount of stool throughout the colon to the level of the distal sigmoid colon where there is a short segment of concentric, medium density wall thickening, best seen on image number 72 series 2. The wall thickening is concentric and lower in density on coronal image number 90. Lesser amount of stool and gas in the normal caliber rectum. Minimal  sigmoid colon diverticulosis. Mild pericolonic soft tissue stranding involving the distal sigmoid colon, most pronounced at the level of maximal distension of the colon by the stool. Normal appearing stomach, small bowel and appendix. Vascular/Lymphatic: Atheromatous arterial calcifications without aneurysm. No enlarged lymph nodes. Inferior vena cava filter with its tip just above the level of the renal veins. Reproductive: Status post hysterectomy. No adnexal masses. Other: Small amount of free peritoneal fluid in the pelvis. Musculoskeletal: Left hip prosthesis. Severe right hip degenerative changes with severe joint space narrowing, extensive subarticular cyst formation, spur formation and bony remodeling. Moderate levoconvex thoracolumbar scoliosis. Interbody and pedicle screw and rod fusion at the L4-5 level. Fusion of portions of the T12 and L1 vertebral bodies. Lumbar and lower thoracic spine degenerative changes. IMPRESSION: 1. Large amount of stool throughout the colon to the  level of the distal sigmoid colon where there is a short segment of concentric, medium density wall thickening. This is concerning for an obstructing short segment mass. It would be unusual for edema due to diverticulitis to involve that short of a segment of bowel. Correlation with sigmoidoscopy or colonoscopy is recommended. 2. Mild pericolonic soft tissue stranding involving the distal sigmoid colon, most pronounced at the level of maximal distension of the colon by the stool. This is suspicious for mild diverticulitis without abscess. There is minimal diverticulosis in that region of the colon. 3. Cholelithiasis. 4. 5 mm probable hemangioma in the spleen. Electronically Signed   By: Claudie Revering M.D.   On: 02/20/2018 20:59        Scheduled Meds: . enoxaparin (LOVENOX) injection  40 mg Subcutaneous Q24H  . meperidine      . midazolam      . ondansetron       Continuous Infusions: . sodium chloride 50 mL/hr at 02/21/18 0419  . ciprofloxacin 400 mg (02/21/18 0801)  . metronidazole 500 mg (02/21/18 1330)     LOS: 1 day    Time spent: 53mins    Kathie Dike, MD [facility]   If 7PM-7AM, please contact night-coverage [email or web address]  02/21/2018, 7:46 PM

## 2018-02-22 ENCOUNTER — Inpatient Hospital Stay: Payer: Self-pay

## 2018-02-22 LAB — COMPREHENSIVE METABOLIC PANEL
ALBUMIN: 2.6 g/dL — AB (ref 3.5–5.0)
ALT: 9 U/L (ref 0–44)
AST: 13 U/L — ABNORMAL LOW (ref 15–41)
Alkaline Phosphatase: 52 U/L (ref 38–126)
Anion gap: 10 (ref 5–15)
BILIRUBIN TOTAL: 0.8 mg/dL (ref 0.3–1.2)
BUN: 20 mg/dL (ref 8–23)
CO2: 26 mmol/L (ref 22–32)
Calcium: 8.2 mg/dL — ABNORMAL LOW (ref 8.9–10.3)
Chloride: 101 mmol/L (ref 98–111)
Creatinine, Ser: 1.1 mg/dL — ABNORMAL HIGH (ref 0.44–1.00)
GFR calc Af Amer: 55 mL/min — ABNORMAL LOW (ref >60)
GFR calc non Af Amer: 47 mL/min — ABNORMAL LOW (ref >60)
GLUCOSE: 100 mg/dL — AB (ref 70–99)
Potassium: 4 mmol/L (ref 3.5–5.1)
Sodium: 137 mmol/L (ref 135–145)
Total Protein: 5 g/dL — ABNORMAL LOW (ref 6.5–8.1)

## 2018-02-22 LAB — CBC
HCT: 33.8 % — ABNORMAL LOW (ref 36.0–46.0)
Hemoglobin: 10.6 g/dL — ABNORMAL LOW (ref 12.0–15.0)
MCH: 30.9 pg (ref 26.0–34.0)
MCHC: 31.4 g/dL (ref 30.0–36.0)
MCV: 98.5 fL (ref 80.0–100.0)
Platelets: 158 10*3/uL (ref 150–400)
RBC: 3.43 MIL/uL — ABNORMAL LOW (ref 3.87–5.11)
RDW: 13.2 % (ref 11.5–15.5)
WBC: 14.2 10*3/uL — ABNORMAL HIGH (ref 4.0–10.5)
nRBC: 0 % (ref 0.0–0.2)

## 2018-02-22 MED ORDER — OXYCODONE-ACETAMINOPHEN 5-325 MG PO TABS
1.0000 | ORAL_TABLET | Freq: Four times a day (QID) | ORAL | Status: DC | PRN
  Administered 2018-02-22 – 2018-03-12 (×9): 1 via ORAL
  Filled 2018-02-22 (×11): qty 1

## 2018-02-22 MED ORDER — SODIUM CHLORIDE 0.9% FLUSH: 10.0000 mL | INTRAVENOUS | Status: DC | PRN

## 2018-02-22 NOTE — Consult Note (Signed)
Reason for Consult: Colonic obstruction, diverticulitis Referring Physician: Dr. Tsosie Billing is an 84 y.o. female.  HPI: Patient is an 84 year old white female with a history of DVT, status post IVC filter placement, hypertension, and diverticulosis who presented to [facility] on 02/20/2018 with worsening lower abdominal pain and swelling.  CT scan of the abdomen revealed colonic obstruction with a narrowing noted at approximately the colorectal juncture.  Patient states she has never had episodes of constipation.  She drinks prune juice and tries to eat fiber regularly.  She underwent a flexible sigmoidoscopy by Dr. Gala Romney yesterday which revealed an area of narrowing and edema at approximately 25 cm from the anus.  He was able to get through this area and saw a significant stool burden proximally.  No neoplasm was seen.  The patient denies any nausea or vomiting.  She states her bowel movements are fairly regular.  She currently has minimal lower abdominal pain.  She feels pressure down there.  Past Medical History:  Diagnosis Date  . Anemia   . Anxiety    pt denies  . Arthritis    KNEES AND BACK  . Bladder incontinence   . Cancer ([place])    basal cell cancer on nose  . Edema of both feet   . Hypertension   . Pneumonia   . PONV (postoperative nausea and vomiting)    PONV X 1 EPISODE, COMES OUT OF ANESTHESIA FAST, SOME AWARENESS DURING END OF COLONSCOPY  . Scoliosis     Past Surgical History:  Procedure Laterality Date  . ABDOMINAL HYSTERECTOMY     2006 VAG HYST  . BACK SURGERY     LOWER, X2  . BIOPSY Right 05/02/2017   Procedure: BIOPSY OF RIGHT UPPER EYELID LESION;  Surgeon: [name], MD;  Location: [facility];  Service: Ophthalmology;  Laterality: Right;  . EYE SURGERY Bilateral    cataract surgery with lens implant  . JOINT REPLACEMENT     2011 LT HIP  . RECONSTRUCTION OF EYELID Right 05/02/2017   Procedure: TOTAL RECONSTRUCTION OF UPPER EYELID RIGHT EYE  WITH FULL THICKNESS SKIN GRAFT FROM RIGHT POSTERIOR EAR;  Surgeon: [name], MD;  Location: [facility];  Service: Ophthalmology;  Laterality: Right;  . TONSILLECTOMY    . TOTAL KNEE ARTHROPLASTY Right 04/27/2014   Procedure: [place] TOTAL KNEE ARTHROPLASTY;  Surgeon: Rod Can, MD;  Location: [facility];  Service: Orthopedics;  Laterality: Right;  . TUBAL LIGATION     1974  . VENA CAVA FILTER PLACEMENT N/A 06/04/2017   Procedure: INSERTION VENA-CAVA FILTER;  Surgeon: Angelia Mould, MD;  Location: [facility] OR;  Service: Vascular;  Laterality: N/A;    Family History  Problem Relation Age of Onset  . Congestive Heart Failure Mother   . Colon cancer Father   . Cancer Brother   . Arthritis Sister   . Asthma Sister   . Diabetes Son   . Diabetes Son     Social History:  reports that she has never smoked. She has never used smokeless tobacco. She reports current alcohol use. She reports that she does not use drugs.  Allergies:  Allergies  Allergen Reactions  . Penicillins Rash and Other (See Comments)    Has patient had a PCN reaction causing immediate rash, facial/tongue/throat swelling, SOB or lightheadedness with hypotension: ###Yes## Has patient had a PCN reaction causing severe rash involving mucus membranes or skin necrosis: No Has patient had a PCN reaction that required  hospitalization No Has patient had a PCN reaction occurring within the last 10 years: No If all of the above answers are "NO", then may proceed with Cephalosporin use.   . Vancomycin     Itching, SOB     Medications: I have reviewed the patient's current medications.  Results for orders placed or performed during the hospital encounter of 02/20/18 (from the past 48 hour(s))  CBC     Status: Abnormal   Collection Time: 02/20/18  7:05 PM  Result Value Ref Range   WBC 11.8 (H) 4.0 - 10.5 K/uL   RBC 4.22 3.87 - 5.11 MIL/uL   Hemoglobin 12.6 12.0 - 15.0 g/dL   HCT 40.4 36.0 - 46.0 %   MCV 95.7 80.0 -  100.0 fL   MCH 29.9 26.0 - 34.0 pg   MCHC 31.2 30.0 - 36.0 g/dL   RDW 13.0 11.5 - 15.5 %   Platelets 187 150 - 400 K/uL   nRBC 0.0 0.0 - 0.2 %    Comment: Performed at [facility], [address] [place], [place] [address]  Comprehensive metabolic panel     Status: Abnormal   Collection Time: 02/20/18  7:05 PM  Result Value Ref Range   Sodium 138 135 - 145 mmol/L   Potassium 3.6 3.5 - 5.1 mmol/L   Chloride 101 98 - 111 mmol/L   CO2 29 22 - 32 mmol/L   Glucose, Bld 145 (H) 70 - 99 mg/dL   BUN 23 8 - 23 mg/dL   Creatinine, Ser 1.06 (H) 0.44 - 1.00 mg/dL   Calcium 9.1 8.9 - 10.3 mg/dL   Total Protein 6.3 (L) 6.5 - 8.1 g/dL   Albumin 3.5 3.5 - 5.0 g/dL   AST 15 15 - 41 U/L   ALT 12 0 - 44 U/L   Alkaline Phosphatase 77 38 - 126 U/L   Total Bilirubin 0.8 0.3 - 1.2 mg/dL   GFR calc non Af Amer 49 (L) >60 mL/min   GFR calc Af Amer 57 (L) >60 mL/min   Anion gap 8 5 - 15    Comment: Performed at [facility], [address], [place], [place] [address]  Urinalysis, Routine w reflex microscopic     Status: Abnormal   Collection Time: 02/20/18  8:45 PM  Result Value Ref Range   Color, Urine YELLOW YELLOW   APPearance CLOUDY (A) CLEAR   Specific Gravity, Urine 1.020 1.005 - 1.030   pH 8.0 5.0 - 8.0   Glucose, UA NEGATIVE NEGATIVE mg/dL   Hgb urine dipstick SMALL (A) NEGATIVE   Bilirubin Urine NEGATIVE NEGATIVE   Ketones, ur NEGATIVE NEGATIVE mg/dL   Protein, ur NEGATIVE NEGATIVE mg/dL   Nitrite NEGATIVE NEGATIVE   Leukocytes, UA NEGATIVE NEGATIVE   WBC, UA 0-5 0 - 5 WBC/hpf   Bacteria, UA RARE (A) NONE SEEN   Squamous Epithelial / LPF 0-5 0 - 5   Mucus PRESENT    Amorphous Crystal PRESENT     Comment: Performed at [facility], [address] [place], [place] [address]  CBC     Status: Abnormal   Collection Time: 02/21/18  4:58 AM  Result Value Ref Range   WBC 11.7 (H) 4.0 - 10.5 K/uL   RBC 3.73 (L) 3.87 - 5.11 MIL/uL   Hemoglobin 11.7 (L) 12.0 - 15.0 g/dL   HCT 36.3 36.0 -  46.0 %   MCV 97.3 80.0 - 100.0 fL   MCH 31.4 26.0 - 34.0 pg  MCHC 32.2 30.0 - 36.0 g/dL   RDW 12.9 11.5 - 15.5 %   Platelets 166 150 - 400 K/uL   nRBC 0.0 0.0 - 0.2 %    Comment: Performed at [facility], [address], [place], [place] [address]  Comprehensive metabolic panel     Status: Abnormal   Collection Time: 02/21/18  4:58 AM  Result Value Ref Range   Sodium 140 135 - 145 mmol/L   Potassium 3.4 (L) 3.5 - 5.1 mmol/L   Chloride 103 98 - 111 mmol/L   CO2 28 22 - 32 mmol/L   Glucose, Bld 111 (H) 70 - 99 mg/dL   BUN 21 8 - 23 mg/dL   Creatinine, Ser 0.97 0.44 - 1.00 mg/dL   Calcium 8.7 (L) 8.9 - 10.3 mg/dL   Total Protein 5.3 (L) 6.5 - 8.1 g/dL   Albumin 2.9 (L) 3.5 - 5.0 g/dL   AST 12 (L) 15 - 41 U/L   ALT 9 0 - 44 U/L   Alkaline Phosphatase 62 38 - 126 U/L   Total Bilirubin 0.9 0.3 - 1.2 mg/dL   GFR calc non Af Amer 55 (L) >60 mL/min   GFR calc Af Amer >60 >60 mL/min   Anion gap 9 5 - 15    Comment: Performed at [facility], [address], [place], [place] [address]  Comprehensive metabolic panel     Status: Abnormal   Collection Time: 02/22/18  6:26 AM  Result Value Ref Range   Sodium 137 135 - 145 mmol/L   Potassium 4.0 3.5 - 5.1 mmol/L   Chloride 101 98 - 111 mmol/L   CO2 26 22 - 32 mmol/L   Glucose, Bld 100 (H) 70 - 99 mg/dL   BUN 20 8 - 23 mg/dL   Creatinine, Ser 1.10 (H) 0.44 - 1.00 mg/dL   Calcium 8.2 (L) 8.9 - 10.3 mg/dL   Total Protein 5.0 (L) 6.5 - 8.1 g/dL   Albumin 2.6 (L) 3.5 - 5.0 g/dL   AST 13 (L) 15 - 41 U/L   ALT 9 0 - 44 U/L   Alkaline Phosphatase 52 38 - 126 U/L   Total Bilirubin 0.8 0.3 - 1.2 mg/dL   GFR calc non Af Amer 47 (L) >60 mL/min   GFR calc Af Amer 55 (L) >60 mL/min   Anion gap 10 5 - 15    Comment: Performed at [facility], [address] [place], [place] [address]  CBC     Status: Abnormal   Collection Time: 02/22/18  6:26 AM  Result Value Ref Range   WBC 14.2 (H) 4.0 - 10.5 K/uL   RBC 3.43 (L) 3.87 - 5.11 MIL/uL    Hemoglobin 10.6 (L) 12.0 - 15.0 g/dL   HCT 33.8 (L) 36.0 - 46.0 %   MCV 98.5 80.0 - 100.0 fL   MCH 30.9 26.0 - 34.0 pg   MCHC 31.4 30.0 - 36.0 g/dL   RDW 13.2 11.5 - 15.5 %   Platelets 158 150 - 400 K/uL   nRBC 0.0 0.0 - 0.2 %    Comment: Performed at [facility], [address], [place], [place] [address]    Ct Abdomen Pelvis W Contrast  Result Date: 02/20/2018 CLINICAL DATA:  Lower abdominal pain and constipation for the past 2 days. Clinical suspicion for diverticulitis. EXAM: CT ABDOMEN AND PELVIS WITH CONTRAST TECHNIQUE: Multidetector CT imaging of the abdomen and pelvis was performed using the standard protocol following bolus administration of  intravenous contrast. CONTRAST:  142mL OMNIPAQUE IOHEXOL 300 MG/ML  SOLN COMPARISON:  Abdomen radiographs dated 02/06/2018. Abdomen and pelvis CT dated 04/08/2015. FINDINGS: Lower chest: Unremarkable. Hepatobiliary: No significant change in previously demonstrated liver cysts. Small number of tiny gallstones in the gallbladder measuring up to 4 mm in maximum diameter each. No gallbladder wall thickening or pericholecystic fluid. Pancreas: Unremarkable. No pancreatic ductal dilatation or surrounding inflammatory changes. Spleen: 5 mm oval area of low density in the spleen on image number 29 series 2, difficult to detect with certainty on the previous examination, partly due to differences in technique and timing. Adrenals/Urinary Tract: Normal appearing adrenal glands. Stable tiny exophytic upper pole left renal cyst. Normal appearing right kidney, ureters and urinary bladder. Stomach/Bowel: Large amount of stool throughout the colon to the level of the distal sigmoid colon where there is a short segment of concentric, medium density wall thickening, best seen on image number 72 series 2. The wall thickening is concentric and lower in density on coronal image number 90. Lesser amount of stool and gas in the normal caliber rectum. Minimal sigmoid colon  diverticulosis. Mild pericolonic soft tissue stranding involving the distal sigmoid colon, most pronounced at the level of maximal distension of the colon by the stool. Normal appearing stomach, small bowel and appendix. Vascular/Lymphatic: Atheromatous arterial calcifications without aneurysm. No enlarged lymph nodes. Inferior vena cava filter with its tip just above the level of the renal veins. Reproductive: Status post hysterectomy. No adnexal masses. Other: Small amount of free peritoneal fluid in the pelvis. Musculoskeletal: Left hip prosthesis. Severe right hip degenerative changes with severe joint space narrowing, extensive subarticular cyst formation, spur formation and bony remodeling. Moderate levoconvex thoracolumbar scoliosis. Interbody and pedicle screw and rod fusion at the L4-5 level. Fusion of portions of the T12 and L1 vertebral bodies. Lumbar and lower thoracic spine degenerative changes. IMPRESSION: 1. Large amount of stool throughout the colon to the level of the distal sigmoid colon where there is a short segment of concentric, medium density wall thickening. This is concerning for an obstructing short segment mass. It would be unusual for edema due to diverticulitis to involve that short of a segment of bowel. Correlation with sigmoidoscopy or colonoscopy is recommended. 2. Mild pericolonic soft tissue stranding involving the distal sigmoid colon, most pronounced at the level of maximal distension of the colon by the stool. This is suspicious for mild diverticulitis without abscess. There is minimal diverticulosis in that region of the colon. 3. Cholelithiasis. 4. 5 mm probable hemangioma in the spleen. Electronically Signed   By: Claudie Revering M.D.   On: 02/20/2018 20:59    ROS:  Pertinent items are noted in HPI.  Blood pressure (!) 115/49, pulse 82, temperature 99 F (37.2 C), temperature source Oral, resp. rate 18, height 5\' 6"  (1.676 m), weight 110.2 kg, SpO2 95 %. Physical Exam:  Pleasant obese white female no acute distress Head is normocephalic, atraumatic Lungs clear to auscultation with good breath sounds bilaterally Heart examination reveals a regular rate and rhythm without S3, S4, murmurs Abdomen is soft and not particularly distended.  No hernias are noted.  Occasional bowel sounds are appreciated.  Some fullness is noted in the left lower quadrant.  No rigidity is noted.  CT scan images personally reviewed Operative note reviewed from Dr. Gala Romney  Assessment/Plan: Impression: Colonic obstruction secondary to presumed diverticulitis.  No significant abdominal pain is noted at this time. Plan: Continue limited bowel rest for now.  Will try some  cathartics, though enemas may not be helpful given the lower colonic edema.  No need for acute surgical intervention at this time, but she may end up with a Hartman's procedure with temporary colostomy should this not resolve.  This was explained to the patient.  Will follow with you.  [name] 02/22/2018, 9:00 AM

## 2018-02-22 NOTE — Progress Notes (Signed)
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  CT scan of the abdomen pelvis indicated possible diverticulitis with large amount of stool throughout the colon and concern for underlying colon obstruction.  She was admitted to the hospital and seen by GI.  She underwent sigmoidoscopy with results as below.  Plan is for surgical consult and continued IV antibiotics   Assessment & Plan:   Active Problems:   Mass of colon   Abnormal CT scan, sigmoid colon   Diverticulitis of large intestine without perforation or abscess without bleeding   Large bowel obstruction (HCC)   Constipation   1. Large bowel obstruction.  Patient was seen by GI and underwent sigmoidoscopy.  Noted to have obstruction in the sigmoid colon with marked edema.  General surgery also following with plans for continued observation.  She may need surgical intervention, but plan is to continue antibiotics and monitor for now. 2. Possible diverticulitis noted on CT scan.  Currently on ciprofloxacin and Flagyl. 3. History of DVT in the past.  Not on anticoagulation due to development of hematoma the right leg.  She is status post IVC filter. 4. Chronic bilateral lower extremity edema.  Likely related to venous insufficiency.  She normally wears TED hose.  At this point, she feels that her swelling is better than what it normally is.  She is chronically on Lasix which is currently on hold.  Keep legs elevated. 5. Hypertension.  Antihypertensives are currently on hold.  Using hydralazine as needed.   DVT prophylaxis: Lovenox Code Status: Full code Family Communication: No family present Disposition Plan: Discharge home once work-up is complete   Consultants:   Gastroenterology  General  surgery  Procedures:  Sigmoidoscopy:-Obstruction of the sigmoid colon at 25 cm as                            described above. Marked edema. Inflammatory process                             and/or malignancy not excluded  Antimicrobials:   Ciprofloxacin 2/6 >  Flagyl 2/6 >   Subjective: Continues to have some abdominal pain.  No vomiting.  Minimal bowel movements.  Objective: Vitals:   02/22/18 0617 02/22/18 0921 02/22/18 1300 02/22/18 1444  BP: (!) 115/49 (!) 125/54 132/60   Pulse: 82 84 88   Resp: 18 18  (!) 22  Temp: 99 F (37.2 C) 99 F (37.2 C)  99.3 F (37.4 C)  TempSrc: Oral Oral  Oral  SpO2: 95% 97% 100%   Weight:      Height:        Intake/Output Summary (Last 24 hours) at 02/22/2018 2020 Last data filed at 02/22/2018 1700 Gross per 24 hour  Intake 1960.18 ml  Output 102 ml  Net 1858.18 ml   Filed Weights   02/21/18 0222  Weight: 110.2 kg    Examination:  General exam: Alert, awake, oriented x 3 Respiratory system: Clear to auscultation. Respiratory effort normal. Cardiovascular system:RRR. No murmurs, rubs, gallops. Gastrointestinal system: Abdomen is nondistended, soft and tender in lower abdomen. No organomegaly or masses felt. Normal bowel sounds heard. Central nervous system:  Alert and oriented. No focal neurological deficits. Extremities: 2+ lower extremity edema with venous stasis Skin: No rashes, lesions or ulcers Psychiatry: Judgement and insight appear normal. Mood & affect appropriate.       Data Reviewed: I have personally reviewed following labs and imaging studies  CBC: Recent Labs  Lab 02/20/18 1905 02/21/18 0458 02/22/18 0626  WBC 11.8* 11.7* 14.2*  HGB 12.6 11.7* 10.6*  HCT 40.4 36.3 33.8*  MCV 95.7 97.3 98.5  PLT 187 166 154   Basic Metabolic Panel: Recent Labs  Lab 02/20/18 1905 02/21/18 0458 02/22/18 0626  NA 138 140 137  K 3.6 3.4* 4.0  CL 101 103 101  CO2 29 28 26   GLUCOSE 145* 111* 100*  BUN 23 21 20    CREATININE 1.06* 0.97 1.10*  CALCIUM 9.1 8.7* 8.2*   GFR: Estimated Creatinine Clearance: 50.5 mL/min (A) (by C-G formula based on SCr of 1.1 mg/dL (H)). Liver Function Tests: Recent Labs  Lab 02/20/18 1905 02/21/18 0458 02/22/18 0626  AST 15 12* 13*  ALT 12 9 9   ALKPHOS 77 62 52  BILITOT 0.8 0.9 0.8  PROT 6.3* 5.3* 5.0*  ALBUMIN 3.5 2.9* 2.6*   No results for input(s): LIPASE, AMYLASE in the last 168 hours. No results for input(s): AMMONIA in the last 168 hours. Coagulation Profile: No results for input(s): INR, PROTIME in the last 168 hours. Cardiac Enzymes: No results for input(s): CKTOTAL, CKMB, CKMBINDEX, TROPONINI in the last 168 hours. BNP (last 3 results) No results for input(s): PROBNP in the last 8760 hours. HbA1C: No results for input(s): HGBA1C in the last 72 hours. CBG: No results for input(s): GLUCAP in the last 168 hours. Lipid Profile: No results for input(s): CHOL, HDL, LDLCALC, TRIG, CHOLHDL, LDLDIRECT in the last 72 hours. Thyroid Function Tests: No results for input(s): TSH, T4TOTAL, FREET4, T3FREE, THYROIDAB in the last 72 hours. Anemia Panel: No results for input(s): VITAMINB12, FOLATE, FERRITIN, TIBC, IRON, RETICCTPCT in the last 72 hours. Sepsis Labs: No results for input(s): PROCALCITON, LATICACIDVEN in the last 168 hours.  No results found for this or any previous visit (from the past 240 hour(s)).       Radiology Studies: Ct Abdomen Pelvis W Contrast  Result Date: 02/20/2018 CLINICAL DATA:  Lower abdominal pain and constipation for the past 2 days. Clinical suspicion for diverticulitis. EXAM: CT ABDOMEN AND PELVIS WITH CONTRAST TECHNIQUE: Multidetector CT imaging of the abdomen and pelvis was performed using the standard protocol following bolus administration of intravenous contrast. CONTRAST:  132mL OMNIPAQUE IOHEXOL 300 MG/ML  SOLN COMPARISON:  Abdomen radiographs dated 02/06/2018. Abdomen and pelvis CT dated 04/08/2015. FINDINGS: Lower  chest: Unremarkable. Hepatobiliary: No significant change in previously demonstrated liver cysts. Small number of tiny gallstones in the gallbladder measuring up to 4 mm in maximum diameter each. No gallbladder wall thickening or pericholecystic fluid. Pancreas: Unremarkable. No pancreatic ductal dilatation or surrounding inflammatory changes. Spleen: 5 mm oval area of low density in the spleen on image number 29 series 2, difficult to detect with certainty on the previous examination, partly due to differences in technique and timing. Adrenals/Urinary Tract: Normal appearing adrenal glands. Stable tiny exophytic upper pole left renal cyst. Normal appearing right kidney, ureters and urinary bladder. Stomach/Bowel: Large amount of stool throughout the colon to the level of the distal sigmoid colon where there is a short segment of concentric, medium density wall thickening, best seen on image number 72 series 2. The wall thickening is concentric and lower in  density on coronal image number 90. Lesser amount of stool and gas in the normal caliber rectum. Minimal sigmoid colon diverticulosis. Mild pericolonic soft tissue stranding involving the distal sigmoid colon, most pronounced at the level of maximal distension of the colon by the stool. Normal appearing stomach, small bowel and appendix. Vascular/Lymphatic: Atheromatous arterial calcifications without aneurysm. No enlarged lymph nodes. Inferior vena cava filter with its tip just above the level of the renal veins. Reproductive: Status post hysterectomy. No adnexal masses. Other: Small amount of free peritoneal fluid in the pelvis. Musculoskeletal: Left hip prosthesis. Severe right hip degenerative changes with severe joint space narrowing, extensive subarticular cyst formation, spur formation and bony remodeling. Moderate levoconvex thoracolumbar scoliosis. Interbody and pedicle screw and rod fusion at the L4-5 level. Fusion of portions of the T12 and L1  vertebral bodies. Lumbar and lower thoracic spine degenerative changes. IMPRESSION: 1. Large amount of stool throughout the colon to the level of the distal sigmoid colon where there is a short segment of concentric, medium density wall thickening. This is concerning for an obstructing short segment mass. It would be unusual for edema due to diverticulitis to involve that short of a segment of bowel. Correlation with sigmoidoscopy or colonoscopy is recommended. 2. Mild pericolonic soft tissue stranding involving the distal sigmoid colon, most pronounced at the level of maximal distension of the colon by the stool. This is suspicious for mild diverticulitis without abscess. There is minimal diverticulosis in that region of the colon. 3. Cholelithiasis. 4. 5 mm probable hemangioma in the spleen. Electronically Signed   By: Claudie Revering M.D.   On: 02/20/2018 20:59   [place] Ekg Site Rite  Result Date: 02/22/2018 If Site Rite image not attached, placement could not be confirmed due to current cardiac rhythm.       Scheduled Meds: . enoxaparin (LOVENOX) injection  40 mg Subcutaneous Q24H   Continuous Infusions: . sodium chloride 50 mL/hr at 02/21/18 0419  . ciprofloxacin 400 mg (02/22/18 0915)  . metronidazole 500 mg (02/22/18 0623)     LOS: 2 days    Time spent: 26mins    Kathie Dike, MD [facility]   If 7PM-7AM, please contact night-coverage [email or web address]  02/22/2018, 8:20 PM

## 2018-02-22 NOTE — Progress Notes (Signed)
Pt lost IV access, and two RN's attempted to try with no success. Discussed with MD, order to place PICC line was placed. Pt also has tried to have several BM's today, but with no luck. Only a tiny bit of yellow/ mucus like substance came out each time. Will continue to monitor.

## 2018-02-23 LAB — CBC
HCT: 32.8 % — ABNORMAL LOW (ref 36.0–46.0)
Hemoglobin: 10.4 g/dL — ABNORMAL LOW (ref 12.0–15.0)
MCH: 30.9 pg (ref 26.0–34.0)
MCHC: 31.7 g/dL (ref 30.0–36.0)
MCV: 97.3 fL (ref 80.0–100.0)
Platelets: 154 10*3/uL (ref 150–400)
RBC: 3.37 MIL/uL — ABNORMAL LOW (ref 3.87–5.11)
RDW: 13.1 % (ref 11.5–15.5)
WBC: 13.3 10*3/uL — ABNORMAL HIGH (ref 4.0–10.5)
nRBC: 0 % (ref 0.0–0.2)

## 2018-02-23 LAB — BASIC METABOLIC PANEL
Anion gap: 6 (ref 5–15)
BUN: 16 mg/dL (ref 8–23)
CALCIUM: 8.1 mg/dL — AB (ref 8.9–10.3)
CO2: 27 mmol/L (ref 22–32)
Chloride: 102 mmol/L (ref 98–111)
Creatinine, Ser: 1.02 mg/dL — ABNORMAL HIGH (ref 0.44–1.00)
GFR calc Af Amer: 60 mL/min — ABNORMAL LOW (ref >60)
GFR calc non Af Amer: 52 mL/min — ABNORMAL LOW (ref >60)
GLUCOSE: 122 mg/dL — AB (ref 70–99)
Potassium: 3.8 mmol/L (ref 3.5–5.1)
Sodium: 135 mmol/L (ref 135–145)

## 2018-02-23 MED ORDER — POLYETHYLENE GLYCOL 3350 17 G PO PACK
17.0000 g | PACK | Freq: Two times a day (BID) | ORAL | Status: DC
  Administered 2018-02-23 – 2018-02-27 (×8): 17 g via ORAL
  Filled 2018-02-23 (×10): qty 1

## 2018-02-23 MED ORDER — PHENOL 1.4 % MT LIQD
1.0000 | OROMUCOSAL | Status: DC | PRN
  Administered 2018-02-23: 1 via OROMUCOSAL
  Filled 2018-02-23: qty 177

## 2018-02-23 NOTE — Progress Notes (Signed)
PROGRESS NOTE    Regina Hall  TGG:[id number] DOB: 06-24-1934 DOA: 02/20/2018 PCP: [name], DO    Brief Narrative:  84 year old female with a history of hypertension, previous DVT of left leg status post IVC filter, presented to the hospital with abdominal pain.  CT scan of the abdomen pelvis indicated possible diverticulitis with large amount of stool throughout the colon and concern for underlying colon obstruction.  She was admitted to the hospital and seen by GI.  She underwent sigmoidoscopy with results as below.  General surgery is following and is treating supportively at this time.  Diet is being advanced.  If she does not tolerate advancement of diet, may need to consider operative management.   Assessment & Plan:   Active Problems:   Mass of colon   Abnormal CT scan, sigmoid colon   Diverticulitis of large intestine without perforation or abscess without bleeding   Large bowel obstruction (HCC)   Constipation   1. Large bowel obstruction.  Patient was seen by GI and underwent sigmoidoscopy.  Noted to have obstruction in the sigmoid colon with marked edema.  General surgery also following with plans for continued observation.  She may need surgical intervention, but plan is to continue antibiotics and monitor for now.  Since she is having some bowel movements and passing gas, diet will be advanced to full liquids today. 2. Possible diverticulitis noted on CT scan.  Currently on ciprofloxacin and Flagyl. 3. History of DVT in the past.  Not on anticoagulation due to development of hematoma the right leg.  She is status post IVC filter. 4. Chronic bilateral lower extremity edema.  Likely related to venous insufficiency.  She normally wears TED hose.  At this point, she feels that her swelling is better than what it normally is.  She is chronically on Lasix which is currently on hold.  Keep legs elevated. 5. Hypertension.  Antihypertensives are currently on hold.  Using  hydralazine as needed.   DVT prophylaxis: Lovenox Code Status: Full code Family Communication: No family present Disposition Plan: Discharge home once work-up is complete   Consultants:   Gastroenterology  General surgery  Procedures:  Sigmoidoscopy:-Obstruction of the sigmoid colon at 25 cm as                            described above. Marked edema. Inflammatory process                             and/or malignancy not excluded  Antimicrobials:   Ciprofloxacin 2/6 >  Flagyl 2/6 >   Subjective: She has had a few small bowel movements but mostly is passing gas.  Feels her abdominal pain is little better today.  No nausea or vomiting.  Objective: Vitals:   02/22/18 1444 02/22/18 2121 02/23/18 0558 02/23/18 1402  BP:  140/60 (!) 119/53 136/65  Pulse:  88 79 89  Resp: (!) 22  18 18   Temp: 99.3 F (37.4 C) 99.2 F (37.3 C) 98.7 F (37.1 C) 99 F (37.2 C)  TempSrc: Oral Oral Oral Oral  SpO2:  96% 97% 97%  Weight:      Height:        Intake/Output Summary (Last 24 hours) at 02/23/2018 1746 Last data filed at 02/23/2018 1716 Gross per 24 hour  Intake 600 ml  Output 900 ml  Net -300 ml   [employer]  02/21/18 0222  Weight: 110.2 kg    Examination:  General exam: Alert, awake, oriented x 3 Respiratory system: Clear to auscultation. Respiratory effort normal. Cardiovascular system:RRR. No murmurs, rubs, gallops. Gastrointestinal system: Abdomen is nondistended, soft and tender in lower abdomen. No organomegaly or masses felt. Normal bowel sounds heard. Central nervous system: Alert and oriented. No focal neurological deficits. Extremities: 2+ edema in the LE with venous stasis Skin: No rashes, lesions or ulcers Psychiatry: Judgement and insight appear normal. Mood & affect appropriate.      Data Reviewed: I have personally reviewed following labs and imaging studies  CBC: Recent Labs  Lab 02/20/18 1905 02/21/18 0458 02/22/18 0626 02/23/18 0547    WBC 11.8* 11.7* 14.2* 13.3*  HGB 12.6 11.7* 10.6* 10.4*  HCT 40.4 36.3 33.8* 32.8*  MCV 95.7 97.3 98.5 97.3  PLT 187 166 158 299   Basic Metabolic Panel: Recent Labs  Lab 02/20/18 1905 02/21/18 0458 02/22/18 0626 02/23/18 0547  NA 138 140 137 135  K 3.6 3.4* 4.0 3.8  CL 101 103 101 102  CO2 29 28 26 27   GLUCOSE 145* 111* 100* 122*  BUN 23 21 20 16   CREATININE 1.06* 0.97 1.10* 1.02*  CALCIUM 9.1 8.7* 8.2* 8.1*   GFR: Estimated Creatinine Clearance: 54.4 mL/min (A) (by C-G formula based on SCr of 1.02 mg/dL (H)). Liver Function Tests: Recent Labs  Lab 02/20/18 1905 02/21/18 0458 02/22/18 0626  AST 15 12* 13*  ALT 12 9 9   ALKPHOS 77 62 52  BILITOT 0.8 0.9 0.8  PROT 6.3* 5.3* 5.0*  ALBUMIN 3.5 2.9* 2.6*   No results for input(s): LIPASE, AMYLASE in the last 168 hours. No results for input(s): AMMONIA in the last 168 hours. Coagulation Profile: No results for input(s): INR, PROTIME in the last 168 hours. Cardiac Enzymes: No results for input(s): CKTOTAL, CKMB, CKMBINDEX, TROPONINI in the last 168 hours. BNP (last 3 results) No results for input(s): PROBNP in the last 8760 hours. HbA1C: No results for input(s): HGBA1C in the last 72 hours. CBG: No results for input(s): GLUCAP in the last 168 hours. Lipid Profile: No results for input(s): CHOL, HDL, LDLCALC, TRIG, CHOLHDL, LDLDIRECT in the last 72 hours. Thyroid Function Tests: No results for input(s): TSH, T4TOTAL, FREET4, T3FREE, THYROIDAB in the last 72 hours. Anemia Panel: No results for input(s): VITAMINB12, FOLATE, FERRITIN, TIBC, IRON, RETICCTPCT in the last 72 hours. Sepsis Labs: No results for input(s): PROCALCITON, LATICACIDVEN in the last 168 hours.  No results found for this or any previous visit (from the past 240 hour(s)).       Radiology Studies: [place] Ekg Site Rite  Result Date: 02/22/2018 If [facility] image not attached, placement could not be confirmed due to current cardiac  rhythm.       Scheduled Meds: . enoxaparin (LOVENOX) injection  40 mg Subcutaneous Q24H  . polyethylene glycol  17 g Oral BID   Continuous Infusions: . sodium chloride 50 mL/hr at 02/22/18 2133  . ciprofloxacin 400 mg (02/23/18 0826)  . metronidazole 500 mg (02/23/18 1024)     LOS: 3 days    Time spent: 84mins    Kathie Dike, MD [facility]   If 7PM-7AM, please contact night-coverage [email or web address]  02/23/2018, 5:46 PM

## 2018-02-23 NOTE — Progress Notes (Signed)
2 Days Post-Op  Subjective: Patient had multiple bowel movements yesterday and is passing flatus.  Objective: [place] Hall in last 24 hours: Temp:  [98.7 F (37.1 C)-99.3 F (37.4 C)] 98.7 F (37.1 C) (02/09 0558) Pulse Rate:  [79-88] 79 (02/09 0558) Resp:  [18-22] 18 (02/09 0558) BP: (119-140)/(53-60) 119/53 (02/09 0558) SpO2:  [96 %-100 %] 97 % (02/09 0558) Last BM Date: 02/19/18  Intake/Output from previous day: 02/08 0701 - 02/09 0700 In: 1344 [P.O.:644; IV Piggyback:700] Out: 502 [Urine:500; Stool:2] Intake/Output this shift: Total I/O In: 360 [P.O.:360] Out: -   General appearance: alert, cooperative and no distress GI: soft, non-tender; bowel sounds normal; no masses,  no organomegaly  Lab Results:  Recent Labs    02/22/18 0626 02/23/18 0547  WBC 14.2* 13.3*  HGB 10.6* 10.4*  HCT 33.8* 32.8*  PLT 158 154   BMET Recent Labs    02/22/18 0626 02/23/18 0547  NA 137 135  K 4.0 3.8  CL 101 102  CO2 26 27  GLUCOSE 100* 122*  BUN 20 16  CREATININE 1.10* 1.02*  CALCIUM 8.2* 8.1*   PT/INR No results for input(s): LABPROT, INR in the last 72 hours.  Studies/Results: [place] [employer]  Result Date: 02/22/2018 If [employer] not attached, placement could not be confirmed due to current cardiac rhythm.   Anti-infectives: Anti-infectives (From admission, onward)   Start     Dose/Rate Route Frequency Ordered Stop   02/21/18 0800  ciprofloxacin (CIPRO) IVPB 400 mg     400 mg 200 mL/hr over 60 Minutes Intravenous Every 12 hours 02/20/18 2204     02/21/18 0500  metroNIDAZOLE (FLAGYL) IVPB 500 mg     500 mg 100 mL/hr over 60 Minutes Intravenous Every 8 hours 02/20/18 2204     02/20/18 2115  ciprofloxacin (CIPRO) IVPB 400 mg     400 mg 200 mL/hr over 60 Minutes Intravenous  Once 02/20/18 2113 02/20/18 2335   02/20/18 2115  metroNIDAZOLE (FLAGYL) tablet 500 mg     500 mg Oral  Once 02/20/18 2113 02/20/18 2147      Assessment/Plan: s/p  Procedure(s): FLEXIBLE SIGMOIDOSCOPY POLYPECTOMY Impression: Colonic obstruction secondary to narrowing in sigmoid colon of unknown etiology.  Clinically, her obstruction seems to be resolving.  No need for acute surgical invention at this time.  Will advance diet as tolerated.  Discussed with Dr. Roderic Palau.  LOS: 3 days    Regina Hall 02/23/2018

## 2018-02-24 ENCOUNTER — Telehealth: Payer: Self-pay | Admitting: Gastroenterology

## 2018-02-24 ENCOUNTER — Encounter (HOSPITAL_COMMUNITY): Payer: Self-pay | Admitting: Internal Medicine

## 2018-02-24 DIAGNOSIS — R933 Abnormal findings on diagnostic imaging of other parts of digestive tract: Secondary | ICD-10-CM

## 2018-02-24 DIAGNOSIS — K5732 Diverticulitis of large intestine without perforation or abscess without bleeding: Secondary | ICD-10-CM

## 2018-02-24 DIAGNOSIS — K56609 Unspecified intestinal obstruction, unspecified as to partial versus complete obstruction: Secondary | ICD-10-CM

## 2018-02-24 LAB — BASIC METABOLIC PANEL
Anion gap: 7 (ref 5–15)
BUN: 12 mg/dL (ref 8–23)
CO2: 26 mmol/L (ref 22–32)
Calcium: 8.2 mg/dL — ABNORMAL LOW (ref 8.9–10.3)
Chloride: 103 mmol/L (ref 98–111)
Creatinine, Ser: 0.92 mg/dL (ref 0.44–1.00)
GFR calc Af Amer: >60 mL/min (ref >60)
GFR calc non Af Amer: 58 mL/min — ABNORMAL LOW (ref >60)
Glucose, Bld: 118 mg/dL — ABNORMAL HIGH (ref 70–99)
Potassium: 3.9 mmol/L (ref 3.5–5.1)
SODIUM: 136 mmol/L (ref 135–145)

## 2018-02-24 LAB — CBC
HCT: 32.1 % — ABNORMAL LOW (ref 36.0–46.0)
HEMOGLOBIN: 10.4 g/dL — AB (ref 12.0–15.0)
MCH: 31.3 pg (ref 26.0–34.0)
MCHC: 32.4 g/dL (ref 30.0–36.0)
MCV: 96.7 fL (ref 80.0–100.0)
Platelets: 177 10*3/uL (ref 150–400)
RBC: 3.32 MIL/uL — ABNORMAL LOW (ref 3.87–5.11)
RDW: 12.5 % (ref 11.5–15.5)
WBC: 13.5 10*3/uL — ABNORMAL HIGH (ref 4.0–10.5)
nRBC: 0 % (ref 0.0–0.2)

## 2018-02-24 NOTE — Telephone Encounter (Signed)
SCHEDULED AND FLOOR NURSE CALLED WITH APPT DATE

## 2018-02-24 NOTE — Progress Notes (Signed)
PROGRESS NOTE                                                                                                                                                                                                             Patient Demographics:    Regina Hall, is a 84 y.o. female, DOB - [birth year], MEQ:[id number]  Admit date - 02/20/2018   Admitting Physician Oswald Hillock, MD  Outpatient Primary MD for the patient is [name], DO  LOS - 4  Outpatient Specialists: None Chief Complaint  Patient presents with  . Constipation       Brief Narrative   84 year old female with hypertension, history of left leg DVT status post IVC filter presented to the hospital with abdominal pain.  CT of the abdomen pelvis shows diverticulitis with large amount of stool throughout the colon and concern for underlying colonic obstruction.  Underwent sigmoidoscopy showing obstruction in the sigmoid colon with marked edema.  GI and surgery following.   Subjective:   Reported small amount of bowel movement last night and passing gas.  Also reports lower quadrant abdominal pain.  Tolerating clears.   Assessment  & Plan :   Principal problem Large bowel (sigmoid) obstruction Underwent sigmoidoscopy showing obstruction in the sigmoid colon with marked edema.  Surgery following and recommend observation for now with antibiotics, no need for surgery.  Having some bowel movement, passing gas.  Advance diet to full liquid and tolerating well. GI recommends outpatient colonoscopy in 6 weeks for full evaluation of the colon. Follow biopsy results from sigmoidoscopy.  Active problems ?  Acute diverticulitis Seen on CT scan.  Continue Cipro and Flagyl.  Chronic bilateral lower extremity edema Suspect secondary to venous insufficiency.  Uses TED hose at home.  On chronic Lasix which is on hold.  Essential hypertension P.o. meds on hold.  PRN  IV hydralazine.       Code Status : Full code  Family Communication  : None at bedside  Disposition Plan  : Pending hospital course.  PT evaluation  Barriers For Discharge : Improving symptoms  Consults  : Surgery, GI  Procedures  : CT abdomen pelvis, sigmoidoscopy  DVT Prophylaxis  :  Lovenox -   Lab Results  Component Value Date   PLT 177 02/24/2018    Antibiotics  :  Anti-infectives (From admission, onward)   Start     Dose/Rate Route Frequency Ordered Stop   02/21/18 0800  ciprofloxacin (CIPRO) IVPB 400 mg     400 mg 200 mL/hr over 60 Minutes Intravenous Every 12 hours 02/20/18 2204     02/21/18 0500  metroNIDAZOLE (FLAGYL) IVPB 500 mg     500 mg 100 mL/hr over 60 Minutes Intravenous Every 8 hours 02/20/18 2204     02/20/18 2115  ciprofloxacin (CIPRO) IVPB 400 mg     400 mg 200 mL/hr over 60 Minutes Intravenous  Once 02/20/18 2113 02/20/18 2335   02/20/18 2115  metroNIDAZOLE (FLAGYL) tablet 500 mg     500 mg Oral  Once 02/20/18 2113 02/20/18 2147        Objective:   Vitals:   02/23/18 0558 02/23/18 1402 02/23/18 2100 02/24/18 0559  BP: (!) 119/53 136/65 136/68 125/61  Pulse: 79 89 99 90  Resp: 18 18 17 20   Temp: 98.7 F (37.1 C) 99 F (37.2 C) 99.7 F (37.6 C) 98.1 F (36.7 C)  TempSrc: Oral Oral Oral Oral  SpO2: 97% 97% 99% 96%  Weight:      Height:        Wt Readings from Last 3 Encounters:  02/21/18 110.2 kg  01/29/18 97.1 kg  12/19/17 103.4 kg     Intake/Output Summary (Last 24 hours) at 02/24/2018 1248 Last data filed at 02/24/2018 0500 Gross per 24 hour  Intake 240 ml  Output 2101 ml  Net -1861 ml     Physical Exam  Gen: not in distress, fatigued HEENT: Pallor present, moist mucosa, supple neck Chest: clear b/l, no added sounds CVS: N S1&S2, no murmurs GI: soft, distended, bowel sounds present, nontender Musculoskeletal: warm, trace edema bilaterally     Data Review:    CBC Recent Labs  Lab 02/20/18 1905  02/21/18 0458 02/22/18 0626 02/23/18 0547 02/24/18 0600  WBC 11.8* 11.7* 14.2* 13.3* 13.5*  HGB 12.6 11.7* 10.6* 10.4* 10.4*  HCT 40.4 36.3 33.8* 32.8* 32.1*  PLT 187 166 158 154 177  MCV 95.7 97.3 98.5 97.3 96.7  MCH 29.9 31.4 30.9 30.9 31.3  MCHC 31.2 32.2 31.4 31.7 32.4  RDW 13.0 12.9 13.2 13.1 12.5    Chemistries  Recent Labs  Lab 02/20/18 1905 02/21/18 0458 02/22/18 0626 02/23/18 0547 02/24/18 0600  NA 138 140 137 135 136  K 3.6 3.4* 4.0 3.8 3.9  CL 101 103 101 102 103  CO2 29 28 26 27 26   GLUCOSE 145* 111* 100* 122* 118*  BUN 23 21 20 16 12   CREATININE 1.06* 0.97 1.10* 1.02* 0.92  CALCIUM 9.1 8.7* 8.2* 8.1* 8.2*  AST 15 12* 13*  --   --   ALT 12 9 9   --   --   ALKPHOS 77 62 52  --   --   BILITOT 0.8 0.9 0.8  --   --    ------------------------------------------------------------------------------------------------------------------ No results for input(s): CHOL, HDL, LDLCALC, TRIG, CHOLHDL, LDLDIRECT in the last 72 hours.  No results found for: HGBA1C ------------------------------------------------------------------------------------------------------------------ No results for input(s): TSH, T4TOTAL, T3FREE, THYROIDAB in the last 72 hours.  Invalid input(s): FREET3 ------------------------------------------------------------------------------------------------------------------ No results for input(s): VITAMINB12, FOLATE, FERRITIN, TIBC, IRON, RETICCTPCT in the last 72 hours.  Coagulation profile No results for input(s): INR, PROTIME in the last 168 hours.  No results for input(s): DDIMER in the last 72 hours.  Cardiac Enzymes No results for input(s): CKMB, TROPONINI, MYOGLOBIN in the last  168 hours.  Invalid input(s): CK ------------------------------------------------------------------------------------------------------------------ No results found for: BNP  Inpatient Medications  Scheduled Meds: . enoxaparin (LOVENOX) injection  40 mg  Subcutaneous Q24H  . polyethylene glycol  17 g Oral BID   Continuous Infusions: . ciprofloxacin 400 mg (02/24/18 0843)  . metronidazole 500 mg (02/24/18 1047)   PRN Meds:.acetaminophen **OR** acetaminophen, hydrALAZINE, HYDROmorphone (DILAUDID) injection, ondansetron **OR** ondansetron (ZOFRAN) IV, oxyCODONE-acetaminophen, phenol, sodium chloride flush  Micro Results No results found for this or any previous visit (from the past 240 hour(s)).  Radiology Reports Ct Abdomen Pelvis W Contrast  Result Date: 02/20/2018 CLINICAL DATA:  Lower abdominal pain and constipation for the past 2 days. Clinical suspicion for diverticulitis. EXAM: CT ABDOMEN AND PELVIS WITH CONTRAST TECHNIQUE: Multidetector CT imaging of the abdomen and pelvis was performed using the standard protocol following bolus administration of intravenous contrast. CONTRAST:  158mL OMNIPAQUE IOHEXOL 300 MG/ML  SOLN COMPARISON:  Abdomen radiographs dated 02/06/2018. Abdomen and pelvis CT dated 04/08/2015. FINDINGS: Lower chest: Unremarkable. Hepatobiliary: No significant change in previously demonstrated liver cysts. Small number of tiny gallstones in the gallbladder measuring up to 4 mm in maximum diameter each. No gallbladder wall thickening or pericholecystic fluid. Pancreas: Unremarkable. No pancreatic ductal dilatation or surrounding inflammatory changes. Spleen: 5 mm oval area of low density in the spleen on image number 29 series 2, difficult to detect with certainty on the previous examination, partly due to differences in technique and timing. Adrenals/Urinary Tract: Normal appearing adrenal glands. Stable tiny exophytic upper pole left renal cyst. Normal appearing right kidney, ureters and urinary bladder. Stomach/Bowel: Large amount of stool throughout the colon to the level of the distal sigmoid colon where there is a short segment of concentric, medium density wall thickening, best seen on image number 72 series 2. The wall  thickening is concentric and lower in density on coronal image number 90. Lesser amount of stool and gas in the normal caliber rectum. Minimal sigmoid colon diverticulosis. Mild pericolonic soft tissue stranding involving the distal sigmoid colon, most pronounced at the level of maximal distension of the colon by the stool. Normal appearing stomach, small bowel and appendix. Vascular/Lymphatic: Atheromatous arterial calcifications without aneurysm. No enlarged lymph nodes. Inferior vena cava filter with its tip just above the level of the renal veins. Reproductive: Status post hysterectomy. No adnexal masses. Other: Small amount of free peritoneal fluid in the pelvis. Musculoskeletal: Left hip prosthesis. Severe right hip degenerative changes with severe joint space narrowing, extensive subarticular cyst formation, spur formation and bony remodeling. Moderate levoconvex thoracolumbar scoliosis. Interbody and pedicle screw and rod fusion at the L4-5 level. Fusion of portions of the T12 and L1 vertebral bodies. Lumbar and lower thoracic spine degenerative changes. IMPRESSION: 1. Large amount of stool throughout the colon to the level of the distal sigmoid colon where there is a short segment of concentric, medium density wall thickening. This is concerning for an obstructing short segment mass. It would be unusual for edema due to diverticulitis to involve that short of a segment of bowel. Correlation with sigmoidoscopy or colonoscopy is recommended. 2. Mild pericolonic soft tissue stranding involving the distal sigmoid colon, most pronounced at the level of maximal distension of the colon by the stool. This is suspicious for mild diverticulitis without abscess. There is minimal diverticulosis in that region of the colon. 3. Cholelithiasis. 4. 5 mm probable hemangioma in the spleen. Electronically Signed   By: Claudie Revering M.D.   On: 02/20/2018 20:59   Dg Abd  2 Views  Result Date: 02/06/2018 CLINICAL DATA:  IVC  filter placement evaluation. EXAM: ABDOMEN - 2 VIEW COMPARISON:  03/29/2017. FINDINGS: Lumbar spine numbered as per prior exam. IVC filter noted with tip at approximately the L1 level. Degenerative changes and scoliosis thoracic spine. L4-L5 fusion. Rounded calcific densities noted over the right upper quadrant, these could represent gallstones, kidney stones, undigested pill fragments. No bowel distention or free air. Stool noted throughout the colon and rectum. IMPRESSION: IVC filter noted with proximal tip at the L1 level. Electronically Signed   By: Marcello Moores  Register   On: 02/06/2018 14:21   [place] Ekg Site Rite  Result Date: 02/22/2018 If Site Rite image not attached, placement could not be confirmed due to current cardiac rhythm.   Time Spent in minutes  25   Akshat Minehart M.D on 02/24/2018 at 12:48 PM  Between 7am to 7pm - Pager - [phone]  After 7pm go to [email or web address] - password [facility]  [facility] -  Office  [phone]

## 2018-02-24 NOTE — Care Management Important Message (Signed)
Important Message  Patient Details  Name: Regina Hall MRN: [id number] Date of Birth: 05-15-1934   Medicare Important Message Given:  Yes    Tommy Medal 02/24/2018, 2:11 PM

## 2018-02-24 NOTE — Progress Notes (Signed)
PT Cancellation Note  Patient Details Name: Regina Hall MRN: [id number] DOB: 02/23/1934   Cancelled Treatment:    Reason Eval/Treat Not Completed: Patient declined, no reason specified. Pt refuses PT eval secondary to pain complaints at this time and being "too weak". PT educated pt on the purpose and benefits of therapy to improve functional mobility independence and return home, but pt refuses. Will complete evaluation tomorrow. Thank you.   3:26 PM, 02/24/18 Talbot Grumbling, DPT Physical Therapist with [facility] [phone] office

## 2018-02-24 NOTE — Telephone Encounter (Signed)
Regina Hall, please arrange outpatient hospital follow-up with [place] in 4 weeks. May use urgent. We will need to also arrange colonoscopy at that time.

## 2018-02-24 NOTE — Progress Notes (Signed)
    Subjective: +flatus. Last BM yesterday a "little spat". Abdominal discomfort lower abdomen. Feels weak. Feels short of breath moving around. Shortness of breath just laying still. Tolerated liquids yesterday. Room air currently. Concerned about going home without help.   Objective: Vital signs in last 24 hours: Temp:  [98.1 F (36.7 C)-99.7 F (37.6 C)] 98.1 F (36.7 C) (02/10 0559) Pulse Rate:  [89-99] 90 (02/10 0559) Resp:  [17-20] 20 (02/10 0559) BP: (125-136)/(61-68) 125/61 (02/10 0559) SpO2:  [96 %-99 %] 96 % (02/10 0559) Last BM Date: 02/23/18 General:   Alert and oriented, no distress  Abdomen:  Bowel sounds present, soft, obese, mildly TTP lower abdomen Extremities:  Without edema. Neurologic:  Alert and  oriented x 4  Psych:  Anxious   Intake/Output from previous day: 02/09 0701 - 02/10 0700 In: 840 [P.O.:840] Out: 2101 [Urine:2100; Stool:1] Intake/Output this shift: No intake/output data recorded.  Lab Results: Recent Labs    02/22/18 0626 02/23/18 0547 02/24/18 0600  WBC 14.2* 13.3* 13.5*  HGB 10.6* 10.4* 10.4*  HCT 33.8* 32.8* 32.1*  PLT 158 154 177   BMET Recent Labs    02/22/18 0626 02/23/18 0547 02/24/18 0600  NA 137 135 136  K 4.0 3.8 3.9  CL 101 102 103  CO2 26 27 26   GLUCOSE 100* 122* 118*  BUN 20 16 12   CREATININE 1.10* 1.02* 0.92  CALCIUM 8.2* 8.1* 8.2*   LFT Recent Labs    02/22/18 0626  PROT 5.0*  ALBUMIN 2.6*  AST 13*  ALT 9  ALKPHOS 29  BILITOT 0.8     Assessment: 84 year old female admitted with large bowel obstruction, possible mild diverticulitis, s/p flex sig this admission with obstruction at sigmoid colon at 25 cm,  marked edema, unable to exclude inflammatory process or malignancy. She is clinically improving with supportive measures. Surgery following. Although she notes shortness of breath to me, she is in no distress whatsoever and denied this to nursing staff after I saw her.   Plan: Continue antibiotics  empirically Will need outpatient colonoscopy in 6 weeks for full evaluation of colon Diet has been advanced per surgery  Will follow peripherally and arrange outpatient follow-up in office   Annitta Needs, PhD, ANP-BC [facility]    LOS: 4 days    02/24/2018, 7:58 AM

## 2018-02-24 NOTE — Progress Notes (Signed)
3 Days Post-Op  Subjective: Patient feels weak, but has no significant abdominal pain.  Is passing flatus and having bowel movements.  Objective: Vital signs in last 24 hours: Temp:  [98.1 F (36.7 C)-99.7 F (37.6 C)] 98.1 F (36.7 C) (02/10 0559) Pulse Rate:  [89-99] 90 (02/10 0559) Resp:  [17-20] 20 (02/10 0559) BP: (125-136)/(61-68) 125/61 (02/10 0559) SpO2:  [96 %-99 %] 96 % (02/10 0559) Last BM Date: 02/23/18  Intake/Output from previous day: 02/09 0701 - 02/10 0700 In: 840 [P.O.:840] Out: 2101 [Urine:2100; Stool:1] Intake/Output this shift: No intake/output data recorded.  General appearance: alert, cooperative and fatigued GI: soft, non-tender; bowel sounds normal; no masses,  no organomegaly  Lab Results:  Recent Labs    02/23/18 0547 02/24/18 0600  WBC 13.3* 13.5*  HGB 10.4* 10.4*  HCT 32.8* 32.1*  PLT 154 177   BMET Recent Labs    02/23/18 0547 02/24/18 0600  NA 135 136  K 3.8 3.9  CL 102 103  CO2 27 26  GLUCOSE 122* 118*  BUN 16 12  CREATININE 1.02* 0.92  CALCIUM 8.1* 8.2*   PT/INR No results for input(s): LABPROT, INR in the last 72 hours.  Studies/Results: [place] [employer]  Result Date: 02/22/2018 If [employer] not attached, placement could not be confirmed due to current cardiac rhythm.   Anti-infectives: Anti-infectives (From admission, onward)   Start     Dose/Rate Route Frequency Ordered Stop   02/21/18 0800  ciprofloxacin (CIPRO) IVPB 400 mg     400 mg 200 mL/hr over 60 Minutes Intravenous Every 12 hours 02/20/18 2204     02/21/18 0500  metroNIDAZOLE (FLAGYL) IVPB 500 mg     500 mg 100 mL/hr over 60 Minutes Intravenous Every 8 hours 02/20/18 2204     02/20/18 2115  ciprofloxacin (CIPRO) IVPB 400 mg     400 mg 200 mL/hr over 60 Minutes Intravenous  Once 02/20/18 2113 02/20/18 2335   02/20/18 2115  metroNIDAZOLE (FLAGYL) tablet 500 mg     500 mg Oral  Once 02/20/18 2113 02/20/18 2147      Assessment/Plan: s/p  Procedure(s): FLEXIBLE SIGMOIDOSCOPY POLYPECTOMY Impression: Patient's colonic obstruction is resolving.  No need for surgical intervention.  Will advance to soft diet.  Okay for discharge from surgery standpoint.  GI to follow-up with patient as an outpatient.  LOS: 4 days    [name] 02/24/2018

## 2018-02-25 ENCOUNTER — Encounter: Payer: Self-pay | Admitting: Internal Medicine

## 2018-02-25 DIAGNOSIS — R5381 Other malaise: Secondary | ICD-10-CM

## 2018-02-25 NOTE — Evaluation (Signed)
Physical Therapy Evaluation Patient Details Name: Regina Hall MRN: [id number] DOB: 06/30/1934 Today's Date: 02/25/2018   History of Present Illness  Regina Hall  is a 84 y.o. female, With history of hypertension, scoliosis, recent DVT of left leg, not on anticoagulation due to development of hematoma in contralateral leg with subsequent IVC filter placement, basal cell cancer on the nose, bladder incontinence came to hospital after patient developed constipation and abdominal pain for past 3 days.  Patient took laxative at home including MiraLAX and milk of magnesia which made her pain worse.  Her PCP recommendation was that if laxative did not help then go to the ED for further evaluation.  Patient has been using occasional hydrocodone for her chronic back pain issues.  But has not used it recently.    Clinical Impression  Patient functioning below baseline and limited for functional mobility and gait as stated below secondary to BLE weakness, fatigue and poor standing balance.  Patient limited to a few side steps at bedside due to weakness, poor standing balance and tolerated sitting up in chair after therapy.  Patient will benefit from continued physical therapy in hospital and recommended venue below to increase strength, balance, endurance for safe ADLs and gait.    Follow Up Recommendations SNF    Equipment Recommendations  None recommended by PT    Recommendations for Other Services       Precautions / Restrictions Precautions Precautions: Fall Restrictions Weight Bearing Restrictions: No      Mobility  Bed Mobility Overal bed mobility: Needs Assistance Bed Mobility: Supine to Sit     Supine to sit: Min assist;Mod assist     General bed mobility comments: slow labored movement, requires assistance to move RLE due to pain/weakness  Transfers Overall transfer level: Needs assistance Equipment used: Rolling walker (2 wheeled) Transfers: Sit to/from [employer] Sit to Stand: Min assist;Mod assist Stand pivot transfers: Mod assist       General transfer comment: slow labored movement  Ambulation/Gait Ambulation/Gait assistance: Mod assist;Max assist Gait Distance (Feet): 4 Feet Assistive device: Rolling walker (2 wheeled) Gait Pattern/deviations: Decreased step length - right;Decreased step length - left;Decreased stride length Gait velocity: slow   General Gait Details: limited to 5-6 slow unsteady side steps due to BLE weakness, poor standing balance  Stairs            Wheelchair Mobility    Modified Rankin (Stroke Patients Only)       Balance Overall balance assessment: Needs assistance Sitting-balance support: Feet supported;No upper extremity supported Sitting balance-Leahy Scale: Good     Standing balance support: Bilateral upper extremity supported;During functional activity Standing balance-Leahy Scale: Poor Standing balance comment: fair/poor with RW                             Pertinent Vitals/Pain Pain Assessment: 0-10 Pain Score: 3  Pain Location: stomach Pain Descriptors / Indicators: Aching Pain Intervention(s): Limited activity within patient's tolerance;Monitored during session    Home Living Family/patient expects to be discharged to:: Private residence Living Arrangements: Spouse/significant other Available Help at Discharge: Family;Available PRN/intermittently Type of Home: House Home Access: Ramped entrance     Home Layout: One level Home Equipment: Walker - 2 wheels;Walker - 4 wheels;Bedside commode;Shower seat - built in      Prior Function Level of Independence: Independent with assistive device(s)         Comments: Nutritional therapist  Hand Dominance        Extremity/Trunk Assessment   Upper Extremity Assessment Upper Extremity Assessment: Generalized weakness    Lower Extremity Assessment Lower Extremity Assessment: Generalized  weakness    Cervical / Trunk Assessment Cervical / Trunk Assessment: Normal  Communication   Communication: No difficulties  Cognition Arousal/Alertness: Awake/alert Behavior During Therapy: WFL for tasks assessed/performed Overall Cognitive Status: Within Functional Limits for tasks assessed                                        General Comments      Exercises     Assessment/Plan    PT Assessment Patient needs continued PT services  PT Problem List Decreased strength;Decreased activity tolerance;Decreased balance;Decreased mobility       PT Treatment Interventions Gait training;Stair training;Functional mobility training;Therapeutic activities;Patient/family education;Therapeutic exercise    PT Goals (Current goals can be found in the Care Plan section)  Acute Rehab PT Goals Patient Stated Goal: return home after rehab PT Goal Formulation: With patient Time For Goal Achievement: 03/11/18 Potential to Achieve Goals: Good    Frequency Min 3X/week   Barriers to discharge        Co-evaluation               AM-PAC PT "6 Clicks" Mobility  Outcome Measure Help needed turning from your back to your side while in a flat bed without using bedrails?: A Little Help needed moving from lying on your back to sitting on the side of a flat bed without using bedrails?: A Lot Help needed moving to and from a bed to a chair (including a wheelchair)?: A Lot Help needed standing up from a chair using your arms (e.g., wheelchair or bedside chair)?: A Lot Help needed to walk in hospital room?: A Lot Help needed climbing 3-5 steps with a railing? : Total 6 Click Score: 12    End of Session Equipment Utilized During Treatment: Gait belt Activity Tolerance: Patient tolerated treatment well;Patient limited by fatigue Patient left: in chair;with call bell/phone within reach Nurse Communication: Mobility status PT Visit Diagnosis: Unsteadiness on feet  (R26.81);Other abnormalities of gait and mobility (R26.89);Muscle weakness (generalized) (M62.81)    Time: 7124-5809 PT Time Calculation (min) (ACUTE ONLY): 28 min   Charges:   PT Evaluation $PT Eval Moderate Complexity: 1 Mod PT Treatments $Therapeutic Activity: 23-37 mins        2:51 PM, 02/25/18 Lonell Grandchild, MPT Physical Therapist with [facility] 336 [phone] office [phone] mobile phone

## 2018-02-25 NOTE — NC FL2 (Signed)
[place] LEVEL OF CARE SCREENING TOOL     IDENTIFICATION  Patient Name: Regina Hall Birthdate: 10-09-1934 Sex: female Admission Date (Current Location): 02/20/2018  [facility] and [place] Number:  [employer] and Address:  [place] [address], [place]      Provider Number: [id number]  Attending Physician Name and Address:  Louellen Molder, MD  Relative Name and Phone Number:       Current Level of Care: Hospital Recommended Level of Care: [place] Prior Approval Number:    Date Approved/Denied:   PASRR Number:    Discharge Plan: SNF    Current Diagnoses: Patient Active Problem List   Diagnosis Date Noted  . Abnormal CT scan, sigmoid colon   . Diverticulitis of large intestine without perforation or abscess without bleeding   . Large bowel obstruction ([place])   . Constipation   . Mass of colon 02/20/2018  . DDD (degenerative disc disease), lumbosacral 12/09/2017  . Impaired fasting glucose 12/09/2017  . DVT (deep venous thrombosis) ([place]) 06/04/2017  . Hematoma of right thigh 06/04/2017  . CKD (chronic kidney disease) stage 2, GFR 60-89 ml/min 06/04/2017  . Essential hypertension 06/04/2017  . Acute blood loss anemia 06/04/2017  . Chronic conjunctivitis 10/22/2016  . Chronic back pain 08/21/2016  . Scoliosis of lumbar spine 03/28/2015  . Primary osteoarthritis of right knee 04/27/2014  . Hip pain 02/17/2013  . History of total hip arthroplasty 09/09/2012    Orientation RESPIRATION BLADDER Height & Weight     Self, Time, Situation, Place  Normal Incontinent Weight: 242 lb 15.2 oz (110.2 kg) Height:  5\' 6"  (167.6 cm)  BEHAVIORAL SYMPTOMS/MOOD NEUROLOGICAL BOWEL NUTRITION STATUS      Incontinent Diet(soft)  AMBULATORY STATUS COMMUNICATION OF NEEDS Skin   Extensive Assist Verbally Normal                       Personal Care Assistance Level of Assistance  Bathing, Feeding, Dressing  Bathing Assistance: Limited assistance Feeding assistance: Independent Dressing Assistance: Limited assistance     Functional Limitations Info  Sight, Hearing, Speech Sight Info: Adequate Hearing Info: Adequate Speech Info: Adequate    SPECIAL CARE FACTORS FREQUENCY  PT (By licensed PT)     PT Frequency: 5x/week              Contractures Contractures Info: Not present    Additional Factors Info  Code Status, Allergies Code Status Info: Full code Allergies Info: Penicillins, Vancomycin Psychotropic Info: n/a         Current Medications (02/25/2018):  This is the current hospital active medication list Current Facility-Administered Medications  Medication Dose Route Frequency Provider Last Rate Last Dose  . acetaminophen (TYLENOL) tablet 650 mg  650 mg Oral Q6H PRN Oswald Hillock, MD       Or  . acetaminophen (TYLENOL) suppository 650 mg  650 mg Rectal Q6H PRN Oswald Hillock, MD      . ciprofloxacin (CIPRO) IVPB 400 mg  400 mg Intravenous Q12H Oswald Hillock, MD 200 mL/hr at 02/25/18 0803 400 mg at 02/25/18 0803  . enoxaparin (LOVENOX) injection 40 mg  40 mg Subcutaneous Q24H Darrick Meigs, Gagan S, MD   40 mg at 02/24/18 2127  . hydrALAZINE (APRESOLINE) injection 10 mg  10 mg Intravenous Q4H PRN Oswald Hillock, MD      . HYDROmorphone (DILAUDID) injection 1 mg  1 mg Intravenous Q4H PRN  Oswald Hillock, MD   1 mg at 02/24/18 1641  . metroNIDAZOLE (FLAGYL) IVPB 500 mg  500 mg Intravenous Q8H Darrick Meigs, Marge Duncans, MD 100 mL/hr at 02/25/18 1026 500 mg at 02/25/18 1026  . ondansetron (ZOFRAN) tablet 4 mg  4 mg Oral Q6H PRN Oswald Hillock, MD       Or  . ondansetron (ZOFRAN) injection 4 mg  4 mg Intravenous Q6H PRN Oswald Hillock, MD      . oxyCODONE-acetaminophen (PERCOCET/ROXICET) 5-325 MG per tablet 1 tablet  1 tablet Oral Q6H PRN Kathie Dike, MD   1 tablet at 02/25/18 0600  . phenol (CHLORASEPTIC) mouth spray 1 spray  1 spray Mouth/Throat PRN Oswald Hillock, MD   1 spray at 02/23/18 0325  .  polyethylene glycol (MIRALAX / GLYCOLAX) packet 17 g  17 g Oral BID [name], MD   17 g at 02/25/18 0800  . sodium chloride flush (NS) 0.9 % injection 10-40 mL  10-40 mL Intracatheter PRN Kathie Dike, MD       Facility-Administered Medications Ordered in Other Encounters  Medication Dose Route Frequency Provider Last Rate Last Dose  . ondansetron (ZOFRAN) 4 mg in sodium chloride 0.9 % 50 mL IVPB  4 mg Intravenous Once Rod Can, MD         Discharge Medications: Please see discharge summary for a list of discharge medications.  Relevant Imaging Results:  Relevant Lab Results:   Additional Information SSN: [id number]  Conall Vangorder, Clydene Pugh, LCSW

## 2018-02-25 NOTE — Progress Notes (Addendum)
PROGRESS NOTE                                                                                                                                                                                                             Patient Demographics:    Regina Hall, is a 84 y.o. female, DOB - Jul 27, 1934, TMH:[id number]  Admit date - 02/20/2018   Admitting Physician Oswald Hillock, MD  Outpatient Primary MD for the patient is [name], DO  LOS - 5  Outpatient Specialists: None Chief Complaint  Patient presents with  . Constipation       Brief Narrative   84 year old female with hypertension, history of left leg DVT status post IVC filter presented to the hospital with abdominal pain.  CT of the abdomen pelvis shows diverticulitis with large amount of stool throughout the colon and concern for underlying colonic obstruction.  Underwent sigmoidoscopy showing obstruction in the sigmoid colon with marked edema.  GI and surgery following.   Subjective:   Reports having off-and-on lower abdominal pain.  Tolerating full liquid and passing gas with small bowel movement.  Refused physical therapy yesterday. Also had fever of 100.9 F yesterday.   Assessment  & Plan :   Principal problem Large bowel (sigmoid) obstruction Underwent sigmoidoscopy showing obstruction in the sigmoid colon with marked edema.  Surgery following and recommend observation for now with antibiotics, no need for surgery.  Having some bowel movement, passing gas.  Advance diet to soft. GI recommends outpatient colonoscopy in 6 weeks for full evaluation of the colon. Sigmoidoscopy biopsy still pending.    Active problems ?  Acute diverticulitis Seen on CT scan.  Continue Cipro and Flagyl for now.  (Day 6/7)  Fever on 2/10 T-max 100.37F.  Will monitor closely on antibiotic.  Chronic bilateral lower extremity edema Suspect secondary to venous  insufficiency.  Uses TED hose at home.  On chronic Lasix which is on hold.  Essential hypertension P.o. meds on hold.  PRN IV hydralazine.  Physical deconditioning PT eval pending.  Reports ambulating with a walker due to scoliosis.  May need SNF.     Code Status : Full code  Family Communication  : None at bedside  Disposition Plan  : Pending hospital course.  PT evaluation  Barriers For Discharge : Improving symptoms  Consults  :  Surgery, GI  Procedures  : CT abdomen pelvis, sigmoidoscopy  DVT Prophylaxis  :  Lovenox -   Lab Results  Component Value Date   PLT 177 02/24/2018    Antibiotics  :    Anti-infectives (From admission, onward)   Start     Dose/Rate Route Frequency Ordered Stop   02/21/18 0800  ciprofloxacin (CIPRO) IVPB 400 mg     400 mg 200 mL/hr over 60 Minutes Intravenous Every 12 hours 02/20/18 2204     02/21/18 0500  metroNIDAZOLE (FLAGYL) IVPB 500 mg     500 mg 100 mL/hr over 60 Minutes Intravenous Every 8 hours 02/20/18 2204     02/20/18 2115  ciprofloxacin (CIPRO) IVPB 400 mg     400 mg 200 mL/hr over 60 Minutes Intravenous  Once 02/20/18 2113 02/20/18 2335   02/20/18 2115  metroNIDAZOLE (FLAGYL) tablet 500 mg     500 mg Oral  Once 02/20/18 2113 02/20/18 2147        Objective:   Vitals:   02/24/18 0559 02/24/18 1428 02/24/18 2159 02/25/18 0547  BP: 125/61 (!) 148/73 127/69 (!) 123/53  Pulse: 90 97 (!) 106 95  Resp: 20 19  19   Temp: 98.1 F (36.7 C) 98.9 F (37.2 C) (!) 100.9 F (38.3 C) 99.6 F (37.6 C)  TempSrc: Oral  Oral Oral  SpO2: 96% 98% 97% 93%  Weight:      Height:        Wt Readings from Last 3 Encounters:  02/21/18 110.2 kg  01/29/18 97.1 kg  12/19/17 103.4 kg     Intake/Output Summary (Last 24 hours) at 02/25/2018 0941 Last data filed at 02/24/2018 2105 Gross per 24 hour  Intake 720 ml  Output 1400 ml  Net -680 ml    Physical exam Elderly female appears fatigued, not in distress HEENT: Moist mucosa, supple  neck Chest: Clear bilaterally CVs: Normal S1 and S2, no murmurs GI: Soft, nondistended, minimal lower quadrant tenderness, bowel sounds present Musculoskeletal: Warm, trace leg edema resolved     Data Review:    CBC Recent Labs  Lab 02/20/18 1905 02/21/18 0458 02/22/18 0626 02/23/18 0547 02/24/18 0600  WBC 11.8* 11.7* 14.2* 13.3* 13.5*  HGB 12.6 11.7* 10.6* 10.4* 10.4*  HCT 40.4 36.3 33.8* 32.8* 32.1*  PLT 187 166 158 154 177  MCV 95.7 97.3 98.5 97.3 96.7  MCH 29.9 31.4 30.9 30.9 31.3  MCHC 31.2 32.2 31.4 31.7 32.4  RDW 13.0 12.9 13.2 13.1 12.5    Chemistries  Recent Labs  Lab 02/20/18 1905 02/21/18 0458 02/22/18 0626 02/23/18 0547 02/24/18 0600  NA 138 140 137 135 136  K 3.6 3.4* 4.0 3.8 3.9  CL 101 103 101 102 103  CO2 29 28 26 27 26   GLUCOSE 145* 111* 100* 122* 118*  BUN 23 21 20 16 12   CREATININE 1.06* 0.97 1.10* 1.02* 0.92  CALCIUM 9.1 8.7* 8.2* 8.1* 8.2*  AST 15 12* 13*  --   --   ALT 12 9 9   --   --   ALKPHOS 77 62 52  --   --   BILITOT 0.8 0.9 0.8  --   --    ------------------------------------------------------------------------------------------------------------------ No results for input(s): CHOL, HDL, LDLCALC, TRIG, CHOLHDL, LDLDIRECT in the last 72 hours.  No results found for: HGBA1C ------------------------------------------------------------------------------------------------------------------ No results for input(s): TSH, T4TOTAL, T3FREE, THYROIDAB in the last 72 hours.  Invalid input(s): FREET3 ------------------------------------------------------------------------------------------------------------------ No results for input(s): VITAMINB12, FOLATE, FERRITIN, TIBC, IRON,  RETICCTPCT in the last 72 hours.  Coagulation profile No results for input(s): INR, PROTIME in the last 168 hours.  No results for input(s): DDIMER in the last 72 hours.  Cardiac Enzymes No results for input(s): CKMB, TROPONINI, MYOGLOBIN in the last 168  hours.  Invalid input(s): CK ------------------------------------------------------------------------------------------------------------------ No results found for: BNP  Inpatient Medications  Scheduled Meds: . enoxaparin (LOVENOX) injection  40 mg Subcutaneous Q24H  . polyethylene glycol  17 g Oral BID   Continuous Infusions: . ciprofloxacin 400 mg (02/25/18 0803)  . metronidazole 500 mg (02/25/18 0300)   PRN Meds:.acetaminophen **OR** acetaminophen, hydrALAZINE, HYDROmorphone (DILAUDID) injection, ondansetron **OR** ondansetron (ZOFRAN) IV, oxyCODONE-acetaminophen, phenol, sodium chloride flush  Micro Results No results found for this or any previous visit (from the past 240 hour(s)).  Radiology Reports Ct Abdomen Pelvis W Contrast  Result Date: 02/20/2018 CLINICAL DATA:  Lower abdominal pain and constipation for the past 2 days. Clinical suspicion for diverticulitis. EXAM: CT ABDOMEN AND PELVIS WITH CONTRAST TECHNIQUE: Multidetector CT imaging of the abdomen and pelvis was performed using the standard protocol following bolus administration of intravenous contrast. CONTRAST:  126mL OMNIPAQUE IOHEXOL 300 MG/ML  SOLN COMPARISON:  Abdomen radiographs dated 02/06/2018. Abdomen and pelvis CT dated 04/08/2015. FINDINGS: Lower chest: Unremarkable. Hepatobiliary: No significant change in previously demonstrated liver cysts. Small number of tiny gallstones in the gallbladder measuring up to 4 mm in maximum diameter each. No gallbladder wall thickening or pericholecystic fluid. Pancreas: Unremarkable. No pancreatic ductal dilatation or surrounding inflammatory changes. Spleen: 5 mm oval area of low density in the spleen on image number 29 series 2, difficult to detect with certainty on the previous examination, partly due to differences in technique and timing. Adrenals/Urinary Tract: Normal appearing adrenal glands. Stable tiny exophytic upper pole left renal cyst. Normal appearing right kidney,  ureters and urinary bladder. Stomach/Bowel: Large amount of stool throughout the colon to the level of the distal sigmoid colon where there is a short segment of concentric, medium density wall thickening, best seen on image number 72 series 2. The wall thickening is concentric and lower in density on coronal image number 90. Lesser amount of stool and gas in the normal caliber rectum. Minimal sigmoid colon diverticulosis. Mild pericolonic soft tissue stranding involving the distal sigmoid colon, most pronounced at the level of maximal distension of the colon by the stool. Normal appearing stomach, small bowel and appendix. Vascular/Lymphatic: Atheromatous arterial calcifications without aneurysm. No enlarged lymph nodes. Inferior vena cava filter with its tip just above the level of the renal veins. Reproductive: Status post hysterectomy. No adnexal masses. Other: Small amount of free peritoneal fluid in the pelvis. Musculoskeletal: Left hip prosthesis. Severe right hip degenerative changes with severe joint space narrowing, extensive subarticular cyst formation, spur formation and bony remodeling. Moderate levoconvex thoracolumbar scoliosis. Interbody and pedicle screw and rod fusion at the L4-5 level. Fusion of portions of the T12 and L1 vertebral bodies. Lumbar and lower thoracic spine degenerative changes. IMPRESSION: 1. Large amount of stool throughout the colon to the level of the distal sigmoid colon where there is a short segment of concentric, medium density wall thickening. This is concerning for an obstructing short segment mass. It would be unusual for edema due to diverticulitis to involve that short of a segment of bowel. Correlation with sigmoidoscopy or colonoscopy is recommended. 2. Mild pericolonic soft tissue stranding involving the distal sigmoid colon, most pronounced at the level of maximal distension of the colon by the stool. This is  suspicious for mild diverticulitis without abscess. There  is minimal diverticulosis in that region of the colon. 3. Cholelithiasis. 4. 5 mm probable hemangioma in the spleen. Electronically Signed   By: Claudie Revering M.D.   On: 02/20/2018 20:59   Dg Abd 2 Views  Result Date: 02/06/2018 CLINICAL DATA:  IVC filter placement evaluation. EXAM: ABDOMEN - 2 VIEW COMPARISON:  03/29/2017. FINDINGS: Lumbar spine numbered as per prior exam. IVC filter noted with tip at approximately the L1 level. Degenerative changes and scoliosis thoracic spine. L4-L5 fusion. Rounded calcific densities noted over the right upper quadrant, these could represent gallstones, kidney stones, undigested pill fragments. No bowel distention or free air. Stool noted throughout the colon and rectum. IMPRESSION: IVC filter noted with proximal tip at the L1 level. Electronically Signed   By: Marcello Moores  Register   On: 02/06/2018 14:21   [place] Ekg Site Rite  Result Date: 02/22/2018 If Site Rite image not attached, placement could not be confirmed due to current cardiac rhythm.   Time Spent in minutes  25   Loyal Rudy M.D on 02/25/2018 at 9:41 AM  Between 7am to 7pm - Pager - [phone]  After 7pm go to [email or web address] - password [facility]  [facility] -  Office  [phone]

## 2018-02-25 NOTE — Care Management Note (Signed)
Case Management Note  Patient Details  Name: Regina Hall MRN: [id number] Date of Birth: 03/18/1934   If discussed at Long Length of Stay Meetings, dates discussed:  02/25/18  Additional Comments:  Brettany Sydney, Chauncey Reading, RN 02/25/2018, 12:55 PM

## 2018-02-25 NOTE — Clinical Social Work Note (Signed)
Clinical Social Work Assessment  Patient Details  Name: Regina Hall MRN: [id number] Date of Birth: 1934-02-03  Date of referral:  02/25/18               Reason for consult:  Facility Placement                Permission sought to share information with:    Permission granted to share information::     Name::        Agency::     Relationship::     Contact Information:     Housing/Transportation Living arrangements for the past 2 months:  Single Family Home Source of Information:  Patient Patient Interpreter Needed:  None Criminal Activity/Legal Involvement Pertinent to Current Situation/Hospitalization:  No - Comment as needed Significant Relationships:  Adult Children, Spouse Lives with:  Spouse Do you feel safe going back to the place where you live?  Yes Need for family participation in patient care:  Yes (Comment)  Care giving concerns:  None identified.    Social Worker assessment / plan:  At baseline, patient ambulates with a rollator, is independent with ADLs and does not drive. She is agreeable to short term rehab.    Employment status:  Retired Forensic scientist:  Medicare PT Recommendations:  [place] / Referral to community resources:  [place]  Patient/Family's Response to care:  Patient is agreeable to short term rehab.   Patient/Family's Understanding of and Emotional Response to Diagnosis, Current Treatment, and Prognosis:  Patient understands her diagnosis, prognosis, and treatment and feels that short term rehab is needed at this time.   Emotional Assessment Appearance:  Appears stated age Attitude/Demeanor/Rapport:    Affect (typically observed):  Calm Orientation:  Oriented to Self, Oriented to Place, Oriented to  Time, Oriented to Situation Alcohol / Substance use:  Not Applicable Psych involvement (Current and /or in the community):     Discharge Needs  Concerns to be addressed:  Discharge Planning  Concerns Readmission within the last 30 days:  No Current discharge risk:  None Barriers to Discharge:  No Barriers Identified   Ihor Gully, LCSW 02/25/2018, 4:42 PM

## 2018-02-25 NOTE — Plan of Care (Signed)
  Problem: Acute Rehab PT Goals(only PT should resolve) Goal: Pt Will Go Supine/Side To Sit Outcome: Progressing Flowsheets (Taken 02/25/2018 1452) Pt will go Supine/Side to Sit: with minimal assist Goal: Patient Will Transfer Sit To/From Stand Outcome: Progressing Flowsheets (Taken 02/25/2018 1452) Patient will transfer sit to/from stand: with minimal assist Goal: Pt Will Transfer Bed To Chair/Chair To Bed Outcome: Progressing Flowsheets (Taken 02/25/2018 1452) Pt will Transfer Bed to Chair/Chair to Bed: with min assist Goal: Pt Will Ambulate Outcome: Progressing Flowsheets (Taken 02/25/2018 1452) Pt will Ambulate: 25 feet; with minimal assist; with rolling walker   2:53 PM, 02/25/18 Lonell Grandchild, MPT Physical Therapist with [facility] [phone] office [phone] mobile phone

## 2018-02-25 NOTE — Progress Notes (Signed)
4 Days Post-Op  Subjective: Patient continues to pass flatus and have bowel movements.  States she feels full after eating.  No nausea or vomiting noted.  No significant abdominal tenderness noted.  Objective: Vital signs in last 24 hours: Temp:  [98.9 F (37.2 C)-100.9 F (38.3 C)] 99.6 F (37.6 C) (02/11 0547) Pulse Rate:  [95-106] 95 (02/11 0547) Resp:  [19] 19 (02/11 0547) BP: (123-148)/(53-73) 123/53 (02/11 0547) SpO2:  [93 %-98 %] 93 % (02/11 0547) Last BM Date: 02/23/18  Intake/Output from previous day: 02/10 0701 - 02/11 0700 In: 720 [P.O.:720] Out: 1400 [Urine:1400] Intake/Output this shift: No intake/output data recorded.  General appearance: alert, cooperative and no distress GI: soft, non-tender; bowel sounds normal; no masses,  no organomegaly  Lab Results:  Recent Labs    02/23/18 0547 02/24/18 0600  WBC 13.3* 13.5*  HGB 10.4* 10.4*  HCT 32.8* 32.1*  PLT 154 177   BMET Recent Labs    02/23/18 0547 02/24/18 0600  NA 135 136  K 3.8 3.9  CL 102 103  CO2 27 26  GLUCOSE 122* 118*  BUN 16 12  CREATININE 1.02* 0.92  CALCIUM 8.1* 8.2*   PT/INR No results for input(s): LABPROT, INR in the last 72 hours.  Studies/Results: No results found.  Anti-infectives: Anti-infectives (From admission, onward)   Start     Dose/Rate Route Frequency Ordered Stop   02/21/18 0800  ciprofloxacin (CIPRO) IVPB 400 mg     400 mg 200 mL/hr over 60 Minutes Intravenous Every 12 hours 02/20/18 2204     02/21/18 0500  metroNIDAZOLE (FLAGYL) IVPB 500 mg     500 mg 100 mL/hr over 60 Minutes Intravenous Every 8 hours 02/20/18 2204     02/20/18 2115  ciprofloxacin (CIPRO) IVPB 400 mg     400 mg 200 mL/hr over 60 Minutes Intravenous  Once 02/20/18 2113 02/20/18 2335   02/20/18 2115  metroNIDAZOLE (FLAGYL) tablet 500 mg     500 mg Oral  Once 02/20/18 2113 02/20/18 2147      Assessment/Plan: s/p Procedure(s): FLEXIBLE SIGMOIDOSCOPY POLYPECTOMY Impression: No  significant colonic bowel obstruction symptoms appreciated.  No need for surgical intervention at this time.  Will continue to follow with you.  LOS: 5 days    [name] 02/25/2018

## 2018-02-26 ENCOUNTER — Inpatient Hospital Stay (HOSPITAL_COMMUNITY): Payer: Medicare Other

## 2018-02-26 DIAGNOSIS — R509 Fever, unspecified: Secondary | ICD-10-CM

## 2018-02-26 LAB — BASIC METABOLIC PANEL
Anion gap: 8 (ref 5–15)
BUN: 14 mg/dL (ref 8–23)
CO2: 25 mmol/L (ref 22–32)
Calcium: 8.3 mg/dL — ABNORMAL LOW (ref 8.9–10.3)
Chloride: 101 mmol/L (ref 98–111)
Creatinine, Ser: 0.9 mg/dL (ref 0.44–1.00)
GFR calc non Af Amer: 60 mL/min — ABNORMAL LOW (ref >60)
Glucose, Bld: 118 mg/dL — ABNORMAL HIGH (ref 70–99)
Potassium: 4 mmol/L (ref 3.5–5.1)
Sodium: 134 mmol/L — ABNORMAL LOW (ref 135–145)

## 2018-02-26 LAB — URINALYSIS, COMPLETE (UACMP) WITH MICROSCOPIC
Bilirubin Urine: NEGATIVE
Glucose, UA: NEGATIVE mg/dL
Ketones, ur: NEGATIVE mg/dL
Leukocytes,Ua: NEGATIVE
Nitrite: NEGATIVE
Protein, ur: NEGATIVE mg/dL
Specific Gravity, Urine: 1.014 (ref 1.005–1.030)
pH: 8 (ref 5.0–8.0)

## 2018-02-26 LAB — CBC
HCT: 34.3 % — ABNORMAL LOW (ref 36.0–46.0)
HEMOGLOBIN: 11.2 g/dL — AB (ref 12.0–15.0)
MCH: 30.8 pg (ref 26.0–34.0)
MCHC: 32.7 g/dL (ref 30.0–36.0)
MCV: 94.2 fL (ref 80.0–100.0)
Platelets: 221 10*3/uL (ref 150–400)
RBC: 3.64 MIL/uL — ABNORMAL LOW (ref 3.87–5.11)
RDW: 12.7 % (ref 11.5–15.5)
WBC: 16.1 10*3/uL — ABNORMAL HIGH (ref 4.0–10.5)
nRBC: 0 % (ref 0.0–0.2)

## 2018-02-26 LAB — INFLUENZA PANEL BY PCR (TYPE A & B)
Influenza A By PCR: NEGATIVE
Influenza B By PCR: NEGATIVE

## 2018-02-26 MED ORDER — MAGNESIUM HYDROXIDE 400 MG/5ML PO SUSP
30.0000 mL | Freq: Two times a day (BID) | ORAL | Status: DC
  Administered 2018-02-26 – 2018-02-27 (×3): 30 mL via ORAL
  Filled 2018-02-26 (×3): qty 30

## 2018-02-26 NOTE — Progress Notes (Signed)
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  CT of the abdomen pelvis shows diverticulitis with large amount of stool throughout the colon and concern for underlying colonic obstruction.  Underwent sigmoidoscopy showing obstruction in the sigmoid colon with marked edema.  GI and surgery following.  Assessment/Plan: Large bowel (sigmoid) obstruction Underwent sigmoidoscopy showing obstruction in the sigmoid colon with marked edema.  Surgery following and recommend observation for now with antibiotics, no need for surgery.  Having some bowel movement, passing gas.  Advance diet to soft. GI recommends outpatient colonoscopy in 6 weeks for full evaluation of the colon. Sigmoidoscopy biopsy --tubular adenoma   Active problems ?  Acute diverticulitis Seen on CT scan.  Continue Cipro and Flagyl for now.  (Day 7/7)  Fever -blood cultures x 2 -UA and urine culture -influenza PCR -CXR  Chronic bilateral lower extremity edema Suspect secondary to venous insufficiency.  Uses TED hose at home.  On chronic Lasix which is on hold.  Essential hypertension P.o. meds on hold.  PRN IV hydralazine.  Physical deconditioning PT eval-->SNF -Reports ambulating with a walker due to scoliosis.     Disposition Plan:  SNF when afebrile Family Communication:  No Family at bedside  Consultants:  General surgery  Code Status:  FULL   DVT Prophylaxis:   Lovenox   Procedures: As Listed in Progress Note Above  Antibiotics: cipro flagyl       Subjective: Patient complains of myalgias all over.  She has a nonproductive cough.  She is been having fevers the last 48 hours.  She denies any nausea, vomiting.  She states abdominal pain is overall improving.  She has had  one bowel movement in the last 24 hours.  She is passing flatus.  There is no vomiting.  She is tolerating her diet.  Objective: Vitals:   02/25/18 2306 02/26/18 0028 02/26/18 0449 02/26/18 1435  BP: (!) 159/71  (!) 166/77 134/64  Pulse: (!) 105  96 95  Resp: (!) 24  18 18   Temp: (!) 102.3 F (39.1 C) (!) 100.9 F (38.3 C) 99.8 F (37.7 C) 99.7 F (37.6 C)  TempSrc: Oral Oral Oral Oral  SpO2: 97%  100% 98%  Weight:      Height:        Intake/Output Summary (Last 24 hours) at 02/26/2018 1758 Last data filed at 02/26/2018 1301 Gross per 24 hour  Intake -  Output 1500 ml  Net -1500 ml   Weight change:  Exam:   General:  Pt is alert, follows commands appropriately, not in acute distress  HEENT: No icterus, No thrush, No neck mass, [place]/AT  Cardiovascular: RRR, S1/S2, no rubs, no gallops  Respiratory: Bibasilar rales.  No wheezing.  Good air movement.  Abdomen: Soft/+BS, non tender, non distended, no guarding  Extremities: 1+ lower extremity edema, No lymphangitis, No petechiae, No rashes, no synovitis   Data Reviewed: I have personally reviewed following labs and imaging studies Basic Metabolic Panel: Recent Labs  Lab 02/21/18 0458 02/22/18 0626 02/23/18 0547 02/24/18 0600 02/26/18 0506  NA 140 137 135 136 134*  K 3.4* 4.0 3.8 3.9 4.0  CL 103 101 102 103 101  CO2 28 26 27 26 25   GLUCOSE 111* 100* 122* 118* 118*  BUN 21 20 16 12 14   CREATININE 0.97 1.10* 1.02* 0.92 0.90  CALCIUM 8.7* 8.2* 8.1* 8.2* 8.3*   Liver Function Tests: Recent Labs  Lab 02/20/18 1905 02/21/18 0458 02/22/18 0626  AST 15 12* 13*  ALT 12 9 9   ALKPHOS 77 62 52  BILITOT 0.8 0.9 0.8  PROT 6.3* 5.3* 5.0*  ALBUMIN 3.5 2.9* 2.6*   No results for input(s): LIPASE, AMYLASE in the last 168 hours. No results for input(s): AMMONIA in the last 168 hours. Coagulation Profile: No results for input(s): INR, PROTIME in the last 168 hours. CBC: Recent Labs  Lab 02/21/18 0458 02/22/18 0626  02/23/18 0547 02/24/18 0600 02/26/18 0506  WBC 11.7* 14.2* 13.3* 13.5* 16.1*  HGB 11.7* 10.6* 10.4* 10.4* 11.2*  HCT 36.3 33.8* 32.8* 32.1* 34.3*  MCV 97.3 98.5 97.3 96.7 94.2  PLT 166 158 154 177 221   Cardiac Enzymes: No results for input(s): CKTOTAL, CKMB, CKMBINDEX, TROPONINI in the last 168 hours. BNP: Invalid input(s): POCBNP CBG: No results for input(s): GLUCAP in the last 168 hours. HbA1C: No results for input(s): HGBA1C in the last 72 hours. Urine analysis:    Component Value Date/Time   COLORURINE YELLOW 02/20/2018 2045   APPEARANCEUR CLOUDY (A) 02/20/2018 2045   LABSPEC 1.020 02/20/2018 2045   PHURINE 8.0 02/20/2018 2045   GLUCOSEU NEGATIVE 02/20/2018 2045   HGBUR SMALL (A) 02/20/2018 2045   BILIRUBINUR NEGATIVE 02/20/2018 2045   KETONESUR NEGATIVE 02/20/2018 2045   PROTEINUR NEGATIVE 02/20/2018 2045   UROBILINOGEN 0.2 04/21/2014 1200   NITRITE NEGATIVE 02/20/2018 2045   LEUKOCYTESUR NEGATIVE 02/20/2018 2045   Sepsis Labs: @LABRCNTIP (procalcitonin:4,lacticidven:4) ) Recent Results (from the past 240 hour(s))  Culture, blood (Routine X 2) w Reflex to ID Panel     Status: None (Preliminary result)   Collection Time: 02/26/18  9:25 AM  Result Value Ref Range Status   Specimen Description LEFT ANTECUBITAL  Final   Special Requests   Final    BOTTLES DRAWN AEROBIC AND ANAEROBIC Blood Culture adequate volume Performed at [facility], [address] [place], [place] [address]    Culture PENDING  Incomplete   Report Status PENDING  Incomplete  Culture, blood (Routine X 2) w Reflex to ID Panel     Status: None (Preliminary result)   Collection Time: 02/26/18  9:33 AM  Result Value Ref Range Status   Specimen Description BLOOD LEFT HAND  Final   Special Requests   Final    BOTTLES DRAWN AEROBIC AND ANAEROBIC Blood Culture adequate volume Performed at [facility], [address] [place],  [address]    Culture PENDING  Incomplete   Report Status  PENDING  Incomplete     Scheduled Meds: . enoxaparin (LOVENOX) injection  40 mg Subcutaneous Q24H  . magnesium hydroxide  30 mL Oral BID  . polyethylene glycol  17 g Oral BID   Continuous Infusions: . ciprofloxacin 400 mg (02/26/18 0834)  . metronidazole 500 mg (02/26/18 1707)    Procedures/Studies: Ct Abdomen Pelvis W Contrast  Result Date: 02/20/2018 CLINICAL DATA:  Lower abdominal pain and constipation for the past 2 days. Clinical suspicion for diverticulitis. EXAM: CT ABDOMEN AND PELVIS WITH CONTRAST TECHNIQUE: Multidetector CT imaging of the abdomen and pelvis was performed using the standard protocol following bolus administration of intravenous contrast. CONTRAST:  158mL OMNIPAQUE IOHEXOL 300 MG/ML  SOLN COMPARISON:  Abdomen radiographs dated 02/06/2018. Abdomen and pelvis CT dated 04/08/2015. FINDINGS: Lower chest: Unremarkable. Hepatobiliary: No significant change in  previously demonstrated liver cysts. Small number of tiny gallstones in the gallbladder measuring up to 4 mm in maximum diameter each. No gallbladder wall thickening or pericholecystic fluid. Pancreas: Unremarkable. No pancreatic ductal dilatation or surrounding inflammatory changes. Spleen: 5 mm oval area of low density in the spleen on image number 29 series 2, difficult to detect with certainty on the previous examination, partly due to differences in technique and timing. Adrenals/Urinary Tract: Normal appearing adrenal glands. Stable tiny exophytic upper pole left renal cyst. Normal appearing right kidney, ureters and urinary bladder. Stomach/Bowel: Large amount of stool throughout the colon to the level of the distal sigmoid colon where there is a short segment of concentric, medium density wall thickening, best seen on image number 72 series 2. The wall thickening is concentric and lower in density on coronal image number 90. Lesser amount of stool and gas in the normal caliber rectum. Minimal sigmoid colon  diverticulosis. Mild pericolonic soft tissue stranding involving the distal sigmoid colon, most pronounced at the level of maximal distension of the colon by the stool. Normal appearing stomach, small bowel and appendix. Vascular/Lymphatic: Atheromatous arterial calcifications without aneurysm. No enlarged lymph nodes. Inferior vena cava filter with its tip just above the level of the renal veins. Reproductive: Status post hysterectomy. No adnexal masses. Other: Small amount of free peritoneal fluid in the pelvis. Musculoskeletal: Left hip prosthesis. Severe right hip degenerative changes with severe joint space narrowing, extensive subarticular cyst formation, spur formation and bony remodeling. Moderate levoconvex thoracolumbar scoliosis. Interbody and pedicle screw and rod fusion at the L4-5 level. Fusion of portions of the T12 and L1 vertebral bodies. Lumbar and lower thoracic spine degenerative changes. IMPRESSION: 1. Large amount of stool throughout the colon to the level of the distal sigmoid colon where there is a short segment of concentric, medium density wall thickening. This is concerning for an obstructing short segment mass. It would be unusual for edema due to diverticulitis to involve that short of a segment of bowel. Correlation with sigmoidoscopy or colonoscopy is recommended. 2. Mild pericolonic soft tissue stranding involving the distal sigmoid colon, most pronounced at the level of maximal distension of the colon by the stool. This is suspicious for mild diverticulitis without abscess. There is minimal diverticulosis in that region of the colon. 3. Cholelithiasis. 4. 5 mm probable hemangioma in the spleen. Electronically Signed   By: Claudie Revering M.D.   On: 02/20/2018 20:59   Dg Abd 2 Views  Result Date: 02/06/2018 CLINICAL DATA:  IVC filter placement evaluation. EXAM: ABDOMEN - 2 VIEW COMPARISON:  03/29/2017. FINDINGS: Lumbar spine numbered as per prior exam. IVC filter noted with tip at  approximately the L1 level. Degenerative changes and scoliosis thoracic spine. L4-L5 fusion. Rounded calcific densities noted over the right upper quadrant, these could represent gallstones, kidney stones, undigested pill fragments. No bowel distention or free air. Stool noted throughout the colon and rectum. IMPRESSION: IVC filter noted with proximal tip at the L1 level. Electronically Signed   By: Marcello Moores  Register   On: 02/06/2018 14:21   [place] Ekg Site Rite  Result Date: 02/22/2018 If Site Rite image not attached, placement could not be confirmed due to current cardiac rhythm.   Orson Eva, DO  [facility] Pager [phone]  If 7PM-7AM, please contact night-coverage [email or web address] Password [facility] 02/26/2018, 5:58 PM   LOS: 6 days

## 2018-02-26 NOTE — Clinical Social Work Placement (Signed)
   CLINICAL SOCIAL WORK PLACEMENT  NOTE  Date:  02/26/2018  Patient Details  Name: Regina Hall MRN: [id number] Date of Birth: [birth year]  Clinical Social Work is seeking post-discharge placement for this patient at the [place] level of care (*CSW will initial, date and re-position this form in  chart as items are completed):  Yes   Patient/family provided with [facility] Work Department's list of facilities offering this level of care within the geographic area requested by the patient (or if unable, by the patient's family).  Yes   Patient/family informed of their freedom to choose among providers that offer the needed level of care, that participate in Medicare, Medicaid or managed care program needed by the patient, have an available bed and are willing to accept the patient.  Yes   Patient/family informed of [facility]'s ownership interest in [facility] and [facility], as well as of the fact that they are under no obligation to receive care at these facilities.  PASRR submitted to EDS on 02/25/18     PASRR number received on       Existing PASRR number confirmed on       FL2 transmitted to all facilities in geographic area requested by pt/family on 02/25/18     FL2 transmitted to all facilities within larger geographic area on       Patient informed that his/her managed care company has contracts with or will negotiate with certain facilities, including the following:        Yes   Patient/family informed of bed offers received.  Patient chooses bed at [facility]     Physician recommends and patient chooses bed at      Patient to be transferred to [facility] on  .  Patient to be transferred to facility by       Patient family notified on   of transfer.  Name of family member notified:        PHYSICIAN       Additional Comment:    _______________________________________________ Ihor Gully,  LCSW 02/26/2018, 5:21 PM

## 2018-02-26 NOTE — Progress Notes (Signed)
5 Days Post-Op  Subjective: Patient states she still feels weak.  No significant complaints noted.  She is passing flatus and having bowel movement, though she states her appetite is decreased.  Objective: Vital signs in last 24 hours: Temp:  [99.8 F (37.7 C)-102.3 F (39.1 C)] 99.8 F (37.7 C) (02/12 0449) Pulse Rate:  [96-106] 96 (02/12 0449) Resp:  [18-24] 18 (02/12 0449) BP: (149-166)/(58-77) 166/77 (02/12 0449) SpO2:  [96 %-100 %] 100 % (02/12 0449) Last BM Date: 02/25/18(small bm)  Intake/Output from previous day: 02/11 0701 - 02/12 0700 In: 720 [P.O.:720] Out: 2100 [Urine:2100] Intake/Output this shift: No intake/output data recorded.  General appearance: alert, cooperative and fatigued GI: soft, non-tender; bowel sounds normal; no masses,  no organomegaly  No point tenderness noted.  No rigidity noted.  Lab Results:  Recent Labs    02/24/18 0600 02/26/18 0506  WBC 13.5* 16.1*  HGB 10.4* 11.2*  HCT 32.1* 34.3*  PLT 177 221   BMET Recent Labs    02/24/18 0600 02/26/18 0506  NA 136 134*  K 3.9 4.0  CL 103 101  CO2 26 25  GLUCOSE 118* 118*  BUN 12 14  CREATININE 0.92 0.90  CALCIUM 8.2* 8.3*   PT/INR No results for input(s): LABPROT, INR in the last 72 hours.  Studies/Results: No results found.  Anti-infectives: Anti-infectives (From admission, onward)   Start     Dose/Rate Route Frequency Ordered Stop   02/21/18 0800  ciprofloxacin (CIPRO) IVPB 400 mg     400 mg 200 mL/hr over 60 Minutes Intravenous Every 12 hours 02/20/18 2204     02/21/18 0500  metroNIDAZOLE (FLAGYL) IVPB 500 mg     500 mg 100 mL/hr over 60 Minutes Intravenous Every 8 hours 02/20/18 2204     02/20/18 2115  ciprofloxacin (CIPRO) IVPB 400 mg     400 mg 200 mL/hr over 60 Minutes Intravenous  Once 02/20/18 2113 02/20/18 2335   02/20/18 2115  metroNIDAZOLE (FLAGYL) tablet 500 mg     500 mg Oral  Once 02/20/18 2113 02/20/18 2147      Assessment/Plan: s/p  Procedure(s): FLEXIBLE SIGMOIDOSCOPY POLYPECTOMY Impression: Colonic obstruction slowly resolving.  She does not have an acute abdomen.  No need for surgical intervention.  Will add milk of magnesia.  LOS: 6 days    [name] 02/26/2018

## 2018-02-27 ENCOUNTER — Inpatient Hospital Stay (HOSPITAL_COMMUNITY): Payer: Medicare Other

## 2018-02-27 LAB — RESPIRATORY PANEL BY PCR
Adenovirus: NOT DETECTED
Bordetella pertussis: NOT DETECTED
CORONAVIRUS OC43-RVPPCR: NOT DETECTED
Chlamydophila pneumoniae: NOT DETECTED
Coronavirus 229E: NOT DETECTED
Coronavirus HKU1: NOT DETECTED
Coronavirus NL63: NOT DETECTED
Influenza A: NOT DETECTED
Influenza B: NOT DETECTED
METAPNEUMOVIRUS-RVPPCR: NOT DETECTED
Mycoplasma pneumoniae: NOT DETECTED
Parainfluenza Virus 1: NOT DETECTED
Parainfluenza Virus 2: NOT DETECTED
Parainfluenza Virus 3: NOT DETECTED
Parainfluenza Virus 4: NOT DETECTED
Respiratory Syncytial Virus: NOT DETECTED
Rhinovirus / Enterovirus: NOT DETECTED

## 2018-02-27 LAB — COMPREHENSIVE METABOLIC PANEL
ALT: 11 U/L (ref 0–44)
AST: 17 U/L (ref 15–41)
Albumin: 2.3 g/dL — ABNORMAL LOW (ref 3.5–5.0)
Alkaline Phosphatase: 55 U/L (ref 38–126)
Anion gap: 10 (ref 5–15)
BUN: 17 mg/dL (ref 8–23)
CO2: 23 mmol/L (ref 22–32)
Calcium: 8.5 mg/dL — ABNORMAL LOW (ref 8.9–10.3)
Chloride: 102 mmol/L (ref 98–111)
Creatinine, Ser: 1.21 mg/dL — ABNORMAL HIGH (ref 0.44–1.00)
GFR calc non Af Amer: 42 mL/min — ABNORMAL LOW (ref >60)
GFR, EST AFRICAN AMERICAN: 49 mL/min — AB (ref >60)
Glucose, Bld: 158 mg/dL — ABNORMAL HIGH (ref 70–99)
Potassium: 3.9 mmol/L (ref 3.5–5.1)
Sodium: 135 mmol/L (ref 135–145)
TOTAL PROTEIN: 5.1 g/dL — AB (ref 6.5–8.1)
Total Bilirubin: 0.8 mg/dL (ref 0.3–1.2)

## 2018-02-27 LAB — CBC
HCT: 41.1 % (ref 36.0–46.0)
Hemoglobin: 13.1 g/dL (ref 12.0–15.0)
MCH: 29.9 pg (ref 26.0–34.0)
MCHC: 31.9 g/dL (ref 30.0–36.0)
MCV: 93.8 fL (ref 80.0–100.0)
Platelets: 332 10*3/uL (ref 150–400)
RBC: 4.38 MIL/uL (ref 3.87–5.11)
RDW: 12.8 % (ref 11.5–15.5)
WBC: 10.1 10*3/uL (ref 4.0–10.5)
nRBC: 0 % (ref 0.0–0.2)

## 2018-02-27 LAB — BRAIN NATRIURETIC PEPTIDE: B Natriuretic Peptide: 325 pg/mL — ABNORMAL HIGH (ref 0.0–100.0)

## 2018-02-27 MED ORDER — FUROSEMIDE 10 MG/ML IJ SOLN
40.0000 mg | Freq: Once | INTRAMUSCULAR | Status: AC
  Administered 2018-02-27: 40 mg via INTRAVENOUS
  Filled 2018-02-27: qty 4

## 2018-02-27 MED ORDER — IOPAMIDOL (ISOVUE-300) INJECTION 61%: 30.0000 mL | Freq: Once | INTRAVENOUS | Status: DC | PRN

## 2018-02-27 MED ORDER — SODIUM CHLORIDE 0.9 % IV SOLN
1.0000 g | INTRAVENOUS | Status: DC
  Administered 2018-02-27: 1000 mg via INTRAVENOUS
  Filled 2018-02-27 (×4): qty 1

## 2018-02-27 MED ORDER — LEVALBUTEROL HCL 0.63 MG/3ML IN NEBU
0.6300 mg | INHALATION_SOLUTION | Freq: Three times a day (TID) | RESPIRATORY_TRACT | Status: DC | PRN
  Administered 2018-02-28: 0.63 mg via RESPIRATORY_TRACT
  Filled 2018-02-27 (×2): qty 3

## 2018-02-27 MED ORDER — HYDRALAZINE HCL 20 MG/ML IJ SOLN: 5.0000 mg | Freq: Four times a day (QID) | INTRAMUSCULAR | Status: DC | PRN

## 2018-02-27 MED ORDER — LACTATED RINGERS IV BOLUS
500.0000 mL | Freq: Once | INTRAVENOUS | Status: AC
  Administered 2018-02-27: 500 mL via INTRAVENOUS

## 2018-02-27 MED ORDER — IOHEXOL 300 MG/ML  SOLN: 100.0000 mL | Freq: Once | INTRAMUSCULAR | Status: DC | PRN

## 2018-02-27 NOTE — Progress Notes (Signed)
PROGRESS NOTE  Regina Hall WVP:[id number] DOB: Dec 09, 1934 DOA: 02/20/2018 PCP: [name], DO  Brief History:  84 year old female with hypertension, history of left leg DVT status post IVC filter presented to the hospital with abdominal pain. CT of the abdomen pelvis shows diverticulitis with large amount of stool throughout the colon and concern for underlying colonic obstruction. Underwent sigmoidoscopy showing obstruction in the sigmoid colon with marked edema. GI and surgery following.  The patient subsequently developed a new fever.  Blood and urine cultures were ordered.  Repeat CT abd/pelvis ordered 2/13  Assessment/Plan: Large bowel (sigmoid) obstruction Underwent sigmoidoscopy showing obstruction in the sigmoid colon with marked edema. Surgery following and recommend observation for now with antibiotics, no need for surgery. Having some bowel movement, passing gas.  -Advance diet to soft--pt is tolerating GI recommends outpatient colonoscopy in 6 weeks for full evaluation of the colon. Sigmoidoscopy biopsy --tubular adenoma -had 3 documented BMs 02/26/18  Active problems ? Acute diverticulitis Seen on CT scan. Continue Cipro and Flagylfor now. (Day 7/7)  Fever FUO -blood cultures x 2 -UA--no pyruia -influenza PCR-neg -CXR--personally reviewed; poor inspiration; increased interstitial markings -repeat CT abd/pelvis -case discussed with Dr. Arnoldo Morale  Chronic bilateral lower extremity edema Suspect secondary to venous insufficiency. Uses TED hose at home.  -lasix on hold earlier in admission  Essential hypertension Holding losartan and lasixPRN IV hydralazine.  Physical deconditioning PT eval-->SNF -Reports ambulating with a walker due to scoliosis.    Disposition Plan:  SNF when afebrile and stable Family Communication:  No Family at bedside  Consultants:  General surgery, GI  Code Status:  FULL   DVT Prophylaxis:  [place]  Lovenox   Procedures: As Listed in Progress Note Above  Antibiotics: cipro 2/7>>> Flagyl2/7>>>     Subjective: Pt continues to c/o abdominal pain, mostly in right flank area.  3 documented BMs in past 24 hours.  She denies HA, neck pain, cp, sob, n/v.  No dysuria  Objective: Vitals:   02/26/18 1949 02/26/18 2154 02/27/18 0020 02/27/18 1332  BP:  (!) 145/83  114/69  Pulse:  (!) 103  (!) 113  Resp:  18  19  Temp:  (!) 102.8 F (39.3 C) (!) 101 F (38.3 C) 98.9 F (37.2 C)  TempSrc:  Oral Oral Oral  SpO2: 98% 94%  95%  Weight:      Height:        Intake/Output Summary (Last 24 hours) at 02/27/2018 1409 Last data filed at 02/27/2018 1006 Gross per 24 hour  Intake 360 ml  Output 450 ml  Net -90 ml   Weight change:  Exam:   General:  Pt is alert, follows commands appropriately, not in acute distress  HEENT: No icterus, No thrush, No neck mass, [place]/AT  Cardiovascular: RRR, S1/S2, no rubs, no gallops  Respiratory: bilateral exp wheeze.  Bilateral rales.    Abdomen: Soft/+BS, diffuse tender, non distended, no guarding  Extremities:  1+LE edema, No lymphangitis, No petechiae, No rashes, no synovitis   Data Reviewed: I have personally reviewed following labs and imaging studies Basic Metabolic Panel: Recent Labs  Lab 02/22/18 0626 02/23/18 0547 02/24/18 0600 02/26/18 0506 02/27/18 0552  NA 137 135 136 134* 135  K 4.0 3.8 3.9 4.0 3.9  CL 101 102 103 101 102  CO2 26 27 26 25 23   GLUCOSE 100* 122* 118* 118* 158*  BUN 20 16 12 14 17   CREATININE 1.10* 1.02*  0.92 0.90 1.21*  CALCIUM 8.2* 8.1* 8.2* 8.3* 8.5*   Liver Function Tests: Recent Labs  Lab 02/20/18 1905 02/21/18 0458 02/22/18 0626 02/27/18 0552  AST 15 12* 13* 17  ALT 12 9 9 11   ALKPHOS 77 62 52 55  BILITOT 0.8 0.9 0.8 0.8  PROT 6.3* 5.3* 5.0* 5.1*  ALBUMIN 3.5 2.9* 2.6* 2.3*   No results for input(s): LIPASE, AMYLASE in the last 168 hours. No results for input(s): AMMONIA in the last  168 hours. Coagulation Profile: No results for input(s): INR, PROTIME in the last 168 hours. CBC: Recent Labs  Lab 02/22/18 0626 02/23/18 0547 02/24/18 0600 02/26/18 0506 02/27/18 0552  WBC 14.2* 13.3* 13.5* 16.1* 10.1  HGB 10.6* 10.4* 10.4* 11.2* 13.1  HCT 33.8* 32.8* 32.1* 34.3* 41.1  MCV 98.5 97.3 96.7 94.2 93.8  PLT 158 154 177 221 332   Cardiac Enzymes: No results for input(s): CKTOTAL, CKMB, CKMBINDEX, TROPONINI in the last 168 hours. BNP: Invalid input(s): POCBNP CBG: No results for input(s): GLUCAP in the last 168 hours. HbA1C: No results for input(s): HGBA1C in the last 72 hours. Urine analysis:    Component Value Date/Time   COLORURINE YELLOW 02/26/2018 [place] 02/26/2018 1712   LABSPEC 1.014 02/26/2018 1712   PHURINE 8.0 02/26/2018 1712   GLUCOSEU NEGATIVE 02/26/2018 1712   HGBUR SMALL (A) 02/26/2018 1712   BILIRUBINUR NEGATIVE 02/26/2018 1712   KETONESUR NEGATIVE 02/26/2018 1712   PROTEINUR NEGATIVE 02/26/2018 1712   UROBILINOGEN 0.2 04/21/2014 1200   NITRITE NEGATIVE 02/26/2018 1712   LEUKOCYTESUR NEGATIVE 02/26/2018 1712   Sepsis Labs: @LABRCNTIP (procalcitonin:4,lacticidven:4) ) Recent Results (from the past 240 hour(s))  Culture, blood (Routine X 2) w Reflex to ID Panel     Status: None (Preliminary result)   Collection Time: 02/26/18  9:25 AM  Result Value Ref Range Status   Specimen Description LEFT ANTECUBITAL  Final   Special Requests   Final    BOTTLES DRAWN AEROBIC AND ANAEROBIC Blood Culture adequate volume   Culture   Final    NO GROWTH < 24 HOURS Performed at [facility], [address], [place], [place] [address]    Report Status PENDING  Incomplete  Culture, blood (Routine X 2) w Reflex to ID Panel     Status: None (Preliminary result)   Collection Time: 02/26/18  9:33 AM  Result Value Ref Range Status   Specimen Description BLOOD LEFT HAND  Final   Special Requests   Final    BOTTLES DRAWN AEROBIC AND ANAEROBIC  Blood Culture adequate volume   Culture   Final    NO GROWTH < 24 HOURS Performed at [facility], [address] [place], [place] [address]    Report Status PENDING  Incomplete     Scheduled Meds: . enoxaparin (LOVENOX) injection  40 mg Subcutaneous Q24H  . magnesium hydroxide  30 mL Oral BID  . polyethylene glycol  17 g Oral BID   Continuous Infusions: . ciprofloxacin 400 mg (02/27/18 1006)  . metronidazole 500 mg (02/27/18 1235)    Procedures/Studies: Ct Abdomen Pelvis W Contrast  Result Date: 02/20/2018 CLINICAL DATA:  Lower abdominal pain and constipation for the past 2 days. Clinical suspicion for diverticulitis. EXAM: CT ABDOMEN AND PELVIS WITH CONTRAST TECHNIQUE: Multidetector CT imaging of the abdomen and pelvis was performed using the standard protocol following bolus administration of intravenous contrast. CONTRAST:  129mL OMNIPAQUE IOHEXOL 300 MG/ML  SOLN COMPARISON:  Abdomen radiographs dated 02/06/2018. Abdomen and  pelvis CT dated 04/08/2015. FINDINGS: Lower chest: Unremarkable. Hepatobiliary: No significant change in previously demonstrated liver cysts. Small number of tiny gallstones in the gallbladder measuring up to 4 mm in maximum diameter each. No gallbladder wall thickening or pericholecystic fluid. Pancreas: Unremarkable. No pancreatic ductal dilatation or surrounding inflammatory changes. Spleen: 5 mm oval area of low density in the spleen on image number 29 series 2, difficult to detect with certainty on the previous examination, partly due to differences in technique and timing. Adrenals/Urinary Tract: Normal appearing adrenal glands. Stable tiny exophytic upper pole left renal cyst. Normal appearing right kidney, ureters and urinary bladder. Stomach/Bowel: Large amount of stool throughout the colon to the level of the distal sigmoid colon where there is a short segment of concentric, medium density wall thickening, best seen on image number 72 series 2. The wall  thickening is concentric and lower in density on coronal image number 90. Lesser amount of stool and gas in the normal caliber rectum. Minimal sigmoid colon diverticulosis. Mild pericolonic soft tissue stranding involving the distal sigmoid colon, most pronounced at the level of maximal distension of the colon by the stool. Normal appearing stomach, small bowel and appendix. Vascular/Lymphatic: Atheromatous arterial calcifications without aneurysm. No enlarged lymph nodes. Inferior vena cava filter with its tip just above the level of the renal veins. Reproductive: Status post hysterectomy. No adnexal masses. Other: Small amount of free peritoneal fluid in the pelvis. Musculoskeletal: Left hip prosthesis. Severe right hip degenerative changes with severe joint space narrowing, extensive subarticular cyst formation, spur formation and bony remodeling. Moderate levoconvex thoracolumbar scoliosis. Interbody and pedicle screw and rod fusion at the L4-5 level. Fusion of portions of the T12 and L1 vertebral bodies. Lumbar and lower thoracic spine degenerative changes. IMPRESSION: 1. Large amount of stool throughout the colon to the level of the distal sigmoid colon where there is a short segment of concentric, medium density wall thickening. This is concerning for an obstructing short segment mass. It would be unusual for edema due to diverticulitis to involve that short of a segment of bowel. Correlation with sigmoidoscopy or colonoscopy is recommended. 2. Mild pericolonic soft tissue stranding involving the distal sigmoid colon, most pronounced at the level of maximal distension of the colon by the stool. This is suspicious for mild diverticulitis without abscess. There is minimal diverticulosis in that region of the colon. 3. Cholelithiasis. 4. 5 mm probable hemangioma in the spleen. Electronically Signed   By: Claudie Revering M.D.   On: 02/20/2018 20:59   Dg Chest Port 1 View  Result Date: 02/26/2018 CLINICAL DATA:   84 year old female with intermittent productive cough EXAM: PORTABLE CHEST 1 VIEW COMPARISON:  02/28/2016 FINDINGS: Cardiomediastinal silhouette unchanged. Interval development of asymmetric elevation the right hemidiaphragm. No pneumothorax. Low lung volumes with coarsened interstitial markings and interlobular septal thickening. No large pleural effusion. No confluent airspace disease. Interval placement of right upper extremity PICC with the tip appearing to terminate superior vena cava. Degenerative changes of the left glenohumeral joint. IMPRESSION: Low lung volumes with questionable edema. New asymmetric elevation the right hemidiaphragm, uncertain significance. Right upper extremity PICC. Electronically Signed   By: Corrie Mckusick D.O.   On: 02/26/2018 19:28   Dg Abd 2 Views  Result Date: 02/06/2018 CLINICAL DATA:  IVC filter placement evaluation. EXAM: ABDOMEN - 2 VIEW COMPARISON:  03/29/2017. FINDINGS: Lumbar spine numbered as per prior exam. IVC filter noted with tip at approximately the L1 level. Degenerative changes and scoliosis thoracic spine. L4-L5 fusion.  Rounded calcific densities noted over the right upper quadrant, these could represent gallstones, kidney stones, undigested pill fragments. No bowel distention or free air. Stool noted throughout the colon and rectum. IMPRESSION: IVC filter noted with proximal tip at the L1 level. Electronically Signed   By: Marcello Moores  Register   On: 02/06/2018 14:21   [place] Ekg Site Rite  Result Date: 02/22/2018 If Site Rite image not attached, placement could not be confirmed due to current cardiac rhythm.   Orson Eva, DO  [facility] Pager [phone]  If 7PM-7AM, please contact night-coverage [email or web address] Password [email or web address] 02/27/2018, 2:09 PM   LOS: 7 days

## 2018-02-27 NOTE — Progress Notes (Signed)
Physical Therapy Treatment Patient Details Name: Regina Hall MRN: [id number] DOB: [birth year] Today's Date: 02/27/2018    History of Present Illness Regina Hall  is a 84 y.o. female, With history of hypertension, scoliosis, recent DVT of left leg, not on anticoagulation due to development of hematoma in contralateral leg with subsequent IVC filter placement, basal cell cancer on the nose, bladder incontinence came to hospital after patient developed constipation and abdominal pain for past 3 days.  Patient took laxative at home including MiraLAX and milk of magnesia which made her pain worse.  Her PCP recommendation was that if laxative did not help then go to the ED for further evaluation.  Patient has been using occasional hydrocodone for her chronic back pain issues.  But has not used it recently.    PT Comments    Pt presents supine in bed with 10/10 abdominal pain complaints and agreeable to therapy with encouragement. Pt continues to be limited by high pain/discomfort in abdominal region and generalized weakness. Pt requires mod assist for bed mobility secondary to pain complaints and verbal cues for sequencing. Pt requires elevated bed and encouragement for transfer training with verbal cues for hand placement with RW. Pt limited to unsteady steps at bedside equalling 5 ft with RW using shuffling/sliding step pattern and assistance with RW for safety. No loss of balance with gait training, but pt unable to continue due to abdominal pain. Pt on room air and O2 sat 97-94% with mobility. Pt displays moderate shortness of breath and anxiety with mobility, but calms with verbal encouragement. Pt tolerates sitting up in chair with bil LE elevated, pillows under LE for pt comfort, call bell in lap and phone in reach. Pt will benefit from continued physical therapy in hospital and recommended venue below to increase strength, balance, endurance for safe ADLs and gait.    Follow Up Recommendations  SNF     Equipment Recommendations  None recommended by PT    Recommendations for Other Services       Precautions / Restrictions Precautions Precautions: Fall Restrictions Weight Bearing Restrictions: No    Mobility  Bed Mobility Overal bed mobility: Needs Assistance Bed Mobility: Supine to Sit     Supine to sit: Mod assist     General bed mobility comments: Pt with slow labored movement requiring assistance for bil LE management and trunk management to come to edge of bed, mod assist to scoot to edge of bed  Transfers Overall transfer level: Needs assistance Equipment used: Rolling walker (2 wheeled) Transfers: Sit to/from [facility] Sit to Stand: Mod assist Stand pivot transfers: Mod assist       General transfer comment: slow labored movement, elevated bed surface, assistance with RW management  Ambulation/Gait Ambulation/Gait assistance: Mod assist Gait Distance (Feet): 5 Feet Assistive device: Rolling walker (2 wheeled) Gait Pattern/deviations: Decreased step length - right;Decreased step length - left;Decreased stride length Gait velocity: decreased   General Gait Details: slow, labored unsteady steps at bedside, fair/poor standing balance, shuffling/sliding step progression, no loss of balance, assistance for RW management   Stairs             Wheelchair Mobility    Modified Rankin (Stroke Patients Only)       Balance Overall balance assessment: Needs assistance Sitting-balance support: Feet supported;No upper extremity supported Sitting balance-Leahy Scale: Fair Sitting balance - Comments: seated edge of bed; pt with weight shifting in all directions secondary to abdominal pain complaints, no loss of balance or  physical assistance required   Standing balance support: Bilateral upper extremity supported;During functional activity Standing balance-Leahy Scale: Poor Standing balance comment: fair/poor with RW                             Cognition Arousal/Alertness: Awake/alert Behavior During Therapy: WFL for tasks assessed/performed Overall Cognitive Status: Within Functional Limits for tasks assessed                                        Exercises      General Comments        Pertinent Vitals/Pain Pain Assessment: 0-10 Pain Score: 10-Worst pain ever Pain Location: stomach/abdomen Pain Descriptors / Indicators: Aching;Constant Pain Intervention(s): Limited activity within patient's tolerance;Monitored during session;Premedicated before session;Repositioned    Home Living                      Prior Function            PT Goals (current goals can now be found in the care plan section) Acute Rehab PT Goals Patient Stated Goal: return home after rehab PT Goal Formulation: With patient Time For Goal Achievement: 03/11/18 Potential to Achieve Goals: Good Progress towards PT goals: Progressing toward goals    Frequency    Min 3X/week      PT Plan Current plan remains appropriate    Co-evaluation              AM-PAC PT "6 Clicks" Mobility   Outcome Measure  Help needed turning from your back to your side while in a flat bed without using bedrails?: A Little Help needed moving from lying on your back to sitting on the side of a flat bed without using bedrails?: A Lot Help needed moving to and from a bed to a chair (including a wheelchair)?: A Lot Help needed standing up from a chair using your arms (e.g., wheelchair or bedside chair)?: A Lot Help needed to walk in hospital room?: A Lot Help needed climbing 3-5 steps with a railing? : Total 6 Click Score: 12    End of Session Equipment Utilized During Treatment: Gait belt Activity Tolerance: Patient tolerated treatment well;Patient limited by pain Patient left: in chair;with call bell/phone within reach;with chair alarm set Nurse Communication: Mobility status PT Visit Diagnosis:  Unsteadiness on feet (R26.81);Other abnormalities of gait and mobility (R26.89);Muscle weakness (generalized) (M62.81)     Time: 1026-1100 PT Time Calculation (min) (ACUTE ONLY): 34 min  Charges:  $Gait Training: 8-22 mins $Therapeutic Activity: 8-22 mins                     12:09 PM, 02/27/18 Talbot Grumbling, DPT Physical Therapist with [facility] [phone] office

## 2018-02-27 NOTE — Progress Notes (Signed)
Discussed CT abd with radiology, Dr. Kris Hartmann  --new small amount of free intraperitoneal air concerning for bowel perforation --mild diverticulitis proximal sigmoid without significant change --mild ascites --case then discussed with Dr. Barnetta Chapel on bowel rest, npo except ice chips -d/c cipro/flagyl -start ertapenem -pt updated on findings and plan and expressed understanding  Total time 30 min in addition to time spent earlier today  DTat

## 2018-02-27 NOTE — Care Management Note (Signed)
Case Management Note  Patient Details  Name: AIMAR SHREWSBURY MRN: [id number] Date of Birth: 1934-04-08   If discussed at Long Length of Stay Meetings, dates discussed:  02/27/18  Additional Comments:  Dekker Verga, Chauncey Reading, RN 02/27/2018, 12:07 PM

## 2018-02-27 NOTE — Progress Notes (Addendum)
Patient has not had any urine output since 0700 this morning, bladder scanner shows 27ml,  MD aware. IV fluids running KVO.

## 2018-02-27 NOTE — Progress Notes (Signed)
6 Days Post-Op  Subjective: Patient states that she has been having right lower quadrant abdominal pain.  Still passing flatus and having bowel movements.  Objective: Vital signs in last 24 hours: Temp:  [99.7 F (37.6 C)-102.8 F (39.3 C)] 101 F (38.3 C) (02/13 0020) Pulse Rate:  [95-103] 103 (02/12 2154) Resp:  [18] 18 (02/12 2154) BP: (134-145)/(64-83) 145/83 (02/12 2154) SpO2:  [94 %-98 %] 94 % (02/12 2154) Last BM Date: 02/27/18  Intake/Output from previous day: 02/12 0701 - 02/13 0700 In: 720 [P.O.:720] Out: 750 [Urine:750] Intake/Output this shift: No intake/output data recorded.  General appearance: alert, cooperative and mild distress GI: Soft with nonspecific tenderness in the abdomen.  Difficult to assess whether she is distended.  Bowel sounds present.  No rigidity noted.  Lab Results:  Recent Labs    02/26/18 0506 02/27/18 0552  WBC 16.1* 10.1  HGB 11.2* 13.1  HCT 34.3* 41.1  PLT 221 332   BMET Recent Labs    02/26/18 0506 02/27/18 0552  NA 134* 135  K 4.0 3.9  CL 101 102  CO2 25 23  GLUCOSE 118* 158*  BUN 14 17  CREATININE 0.90 1.21*  CALCIUM 8.3* 8.5*   PT/INR No results for input(s): LABPROT, INR in the last 72 hours.  Studies/Results: Dg Chest Port 1 View  Result Date: 02/26/2018 CLINICAL DATA:  84 year old female with intermittent productive cough EXAM: PORTABLE CHEST 1 VIEW COMPARISON:  02/28/2016 FINDINGS: Cardiomediastinal silhouette unchanged. Interval development of asymmetric elevation the right hemidiaphragm. No pneumothorax. Low lung volumes with coarsened interstitial markings and interlobular septal thickening. No large pleural effusion. No confluent airspace disease. Interval placement of right upper extremity PICC with the tip appearing to terminate superior vena cava. Degenerative changes of the left glenohumeral joint. IMPRESSION: Low lung volumes with questionable edema. New asymmetric elevation the right hemidiaphragm,  uncertain significance. Right upper extremity PICC. Electronically Signed   By: Corrie Mckusick D.O.   On: 02/26/2018 19:28    Anti-infectives: Anti-infectives (From admission, onward)   Start     Dose/Rate Route Frequency Ordered Stop   02/21/18 0800  ciprofloxacin (CIPRO) IVPB 400 mg     400 mg 200 mL/hr over 60 Minutes Intravenous Every 12 hours 02/20/18 2204     02/21/18 0500  metroNIDAZOLE (FLAGYL) IVPB 500 mg     500 mg 100 mL/hr over 60 Minutes Intravenous Every 8 hours 02/20/18 2204     02/20/18 2115  ciprofloxacin (CIPRO) IVPB 400 mg     400 mg 200 mL/hr over 60 Minutes Intravenous  Once 02/20/18 2113 02/20/18 2335   02/20/18 2115  metroNIDAZOLE (FLAGYL) tablet 500 mg     500 mg Oral  Once 02/20/18 2113 02/20/18 2147      Assessment/Plan: s/p Procedure(s): FLEXIBLE SIGMOIDOSCOPY POLYPECTOMY Impression: New onset right-sided abdominal pain.  Patient did have a fever yesterday evening, although her leukocytosis has resolved.  Will get follow-up CT scan of the abdomen.  Further management is pending those results.  LOS: 7 days    [name] 02/27/2018

## 2018-02-28 ENCOUNTER — Encounter (HOSPITAL_COMMUNITY): Payer: Self-pay | Admitting: Anesthesiology

## 2018-02-28 ENCOUNTER — Inpatient Hospital Stay (HOSPITAL_COMMUNITY): Payer: Medicare Other

## 2018-02-28 ENCOUNTER — Encounter (HOSPITAL_COMMUNITY)
Admission: EM | Disposition: A | Discharge status: Skilled Nursing Facility | Payer: Self-pay | Source: Home / Self Care | Attending: Internal Medicine

## 2018-02-28 ENCOUNTER — Inpatient Hospital Stay (HOSPITAL_COMMUNITY): Payer: Medicare Other | Admitting: Anesthesiology

## 2018-02-28 DIAGNOSIS — K5720 Diverticulitis of large intestine with perforation and abscess without bleeding: Principal | ICD-10-CM

## 2018-02-28 DIAGNOSIS — K631 Perforation of intestine (nontraumatic): Secondary | ICD-10-CM

## 2018-02-28 DIAGNOSIS — N179 Acute kidney failure, unspecified: Secondary | ICD-10-CM

## 2018-02-28 DIAGNOSIS — K658 Other peritonitis: Secondary | ICD-10-CM

## 2018-02-28 HISTORY — PX: COLECTOMY WITH COLOSTOMY CREATION/HARTMANN PROCEDURE: SHX6598

## 2018-02-28 LAB — BASIC METABOLIC PANEL
Anion gap: 13 (ref 5–15)
BUN: 31 mg/dL — ABNORMAL HIGH (ref 8–23)
CO2: 24 mmol/L (ref 22–32)
Calcium: 8.8 mg/dL — ABNORMAL LOW (ref 8.9–10.3)
Chloride: 96 mmol/L — ABNORMAL LOW (ref 98–111)
Creatinine, Ser: 2.23 mg/dL — ABNORMAL HIGH (ref 0.44–1.00)
GFR calc Af Amer: 23 mL/min — ABNORMAL LOW (ref >60)
GFR calc non Af Amer: 20 mL/min — ABNORMAL LOW (ref >60)
Glucose, Bld: 100 mg/dL — ABNORMAL HIGH (ref 70–99)
Potassium: 4.5 mmol/L (ref 3.5–5.1)
Sodium: 133 mmol/L — ABNORMAL LOW (ref 135–145)

## 2018-02-28 LAB — BLOOD GAS, ARTERIAL
Acid-base deficit: 3.2 mmol/L — ABNORMAL HIGH (ref 0.0–2.0)
Bicarbonate: 21.6 mmol/L (ref 20.0–28.0)
FIO2: 60
O2 Saturation: 96.4 %
PCO2 ART: 40.3 mmHg (ref 32.0–48.0)
Patient temperature: 37
pH, Arterial: 7.347 — ABNORMAL LOW (ref 7.350–7.450)
pO2, Arterial: 92.6 mmHg (ref 83.0–108.0)

## 2018-02-28 LAB — GLUCOSE, CAPILLARY
Glucose-Capillary: 144 mg/dL — ABNORMAL HIGH (ref 70–99)
Glucose-Capillary: 145 mg/dL — ABNORMAL HIGH (ref 70–99)

## 2018-02-28 LAB — CBC
HCT: 40.1 % (ref 36.0–46.0)
Hemoglobin: 13.1 g/dL (ref 12.0–15.0)
MCH: 30.7 pg (ref 26.0–34.0)
MCHC: 32.7 g/dL (ref 30.0–36.0)
MCV: 93.9 fL (ref 80.0–100.0)
Platelets: 328 K/uL (ref 150–400)
RBC: 4.27 MIL/uL (ref 3.87–5.11)
RDW: 13.2 % (ref 11.5–15.5)
WBC: 26.2 K/uL — ABNORMAL HIGH (ref 4.0–10.5)
nRBC: 0 % (ref 0.0–0.2)

## 2018-02-28 LAB — URINE CULTURE: Culture: NO GROWTH

## 2018-02-28 LAB — MAGNESIUM: Magnesium: 2.5 mg/dL — ABNORMAL HIGH (ref 1.7–2.4)

## 2018-02-28 LAB — SURGICAL PCR SCREEN
MRSA, PCR: NEGATIVE
Staphylococcus aureus: NEGATIVE

## 2018-02-28 SURGERY — COLECTOMY, WITH COLOSTOMY CREATION
Anesthesia: General

## 2018-02-28 MED ORDER — FENTANYL CITRATE (PF) 100 MCG/2ML IJ SOLN: 50.0000 ug | INTRAMUSCULAR | Status: DC | PRN

## 2018-02-28 MED ORDER — POVIDONE-IODINE 10 % EX OINT
TOPICAL_OINTMENT | CUTANEOUS | Status: AC
  Filled 2018-02-28: qty 1

## 2018-02-28 MED ORDER — CHLORHEXIDINE GLUCONATE CLOTH 2 % EX PADS: 6.0000 | MEDICATED_PAD | Freq: Once | CUTANEOUS | Status: DC

## 2018-02-28 MED ORDER — FENTANYL CITRATE (PF) 250 MCG/5ML IJ SOLN
INTRAMUSCULAR | Status: AC
  Filled 2018-02-28: qty 5

## 2018-02-28 MED ORDER — IPRATROPIUM-ALBUTEROL 0.5-2.5 (3) MG/3ML IN SOLN: 3.0000 mL | Freq: Four times a day (QID) | RESPIRATORY_TRACT | Status: DC

## 2018-02-28 MED ORDER — AMIODARONE HCL IN DEXTROSE 360-4.14 MG/200ML-% IV SOLN
INTRAVENOUS | Status: AC
  Administered 2018-02-28: 60 mg/h via INTRAVENOUS
  Filled 2018-02-28: qty 200

## 2018-02-28 MED ORDER — PANTOPRAZOLE SODIUM 40 MG IV SOLR
40.0000 mg | Freq: Two times a day (BID) | INTRAVENOUS | Status: DC
  Administered 2018-02-28 – 2018-03-05 (×12): 40 mg via INTRAVENOUS
  Filled 2018-02-28 (×11): qty 40

## 2018-02-28 MED ORDER — AMIODARONE LOAD VIA INFUSION
150.0000 mg | Freq: Once | INTRAVENOUS | Status: AC
  Administered 2018-02-28: 150 mg via INTRAVENOUS
  Filled 2018-02-28: qty 83.34

## 2018-02-28 MED ORDER — PHENYLEPHRINE 40 MCG/ML (10ML) SYRINGE FOR IV PUSH (FOR BLOOD PRESSURE SUPPORT)
PREFILLED_SYRINGE | INTRAVENOUS | Status: AC
  Filled 2018-02-28: qty 10

## 2018-02-28 MED ORDER — MUPIROCIN 2 % EX OINT
1.0000 "application " | TOPICAL_OINTMENT | Freq: Two times a day (BID) | CUTANEOUS | Status: DC
  Administered 2018-03-01 – 2018-03-03 (×6): 1 via NASAL
  Filled 2018-02-28 (×3): qty 22

## 2018-02-28 MED ORDER — AMIODARONE IV BOLUS ONLY 150 MG/100ML
INTRAVENOUS | Status: AC
  Administered 2018-02-28: 150 mg
  Filled 2018-02-28: qty 100

## 2018-02-28 MED ORDER — SODIUM CHLORIDE 0.9 % IR SOLN
Status: DC | PRN
  Administered 2018-02-28 (×5): 1000 mL

## 2018-02-28 MED ORDER — SODIUM CHLORIDE 0.9 % IV SOLN
INTRAVENOUS | Status: DC
  Administered 2018-02-28: 09:00:00 via INTRAVENOUS

## 2018-02-28 MED ORDER — SUCCINYLCHOLINE CHLORIDE 20 MG/ML IJ SOLN
INTRAMUSCULAR | Status: DC | PRN
  Administered 2018-02-28: 120 mg via INTRAVENOUS

## 2018-02-28 MED ORDER — GLYCOPYRROLATE PF 0.2 MG/ML IJ SOSY
PREFILLED_SYRINGE | INTRAMUSCULAR | Status: AC
  Filled 2018-02-28: qty 1

## 2018-02-28 MED ORDER — LEVALBUTEROL HCL 1.25 MG/0.5ML IN NEBU
1.2500 mg | INHALATION_SOLUTION | Freq: Four times a day (QID) | RESPIRATORY_TRACT | Status: DC
  Administered 2018-02-28 – 2018-03-06 (×25): 1.25 mg via RESPIRATORY_TRACT
  Filled 2018-02-28 (×26): qty 0.5

## 2018-02-28 MED ORDER — AMIODARONE HCL IN DEXTROSE 360-4.14 MG/200ML-% IV SOLN
30.0000 mg/h | INTRAVENOUS | Status: DC
  Administered 2018-03-01 – 2018-03-05 (×8): 30 mg/h via INTRAVENOUS
  Filled 2018-02-28 (×9): qty 200

## 2018-02-28 MED ORDER — PHENYLEPHRINE HCL 10 MG/ML IJ SOLN
INTRAMUSCULAR | Status: DC | PRN
  Administered 2018-02-28 (×6): 80 ug via INTRAVENOUS

## 2018-02-28 MED ORDER — PROPOFOL 10 MG/ML IV BOLUS
INTRAVENOUS | Status: AC
  Filled 2018-02-28: qty 20

## 2018-02-28 MED ORDER — ALBUMIN HUMAN 25 % IV SOLN
25.0000 g | Freq: Once | INTRAVENOUS | Status: AC
  Administered 2018-02-28: 25 g via INTRAVENOUS
  Filled 2018-02-28: qty 50

## 2018-02-28 MED ORDER — MIDAZOLAM HCL 2 MG/2ML IJ SOLN
INTRAMUSCULAR | Status: AC
  Filled 2018-02-28: qty 2

## 2018-02-28 MED ORDER — SODIUM CHLORIDE 0.9 % IV BOLUS
1000.0000 mL | Freq: Once | INTRAVENOUS | Status: AC
  Administered 2018-02-28: 1000 mL via INTRAVENOUS

## 2018-02-28 MED ORDER — SODIUM CHLORIDE 0.9 % IV SOLN
500.0000 mg | INTRAVENOUS | Status: DC
  Administered 2018-02-28 – 2018-03-02 (×2): 500 mg via INTRAVENOUS
  Filled 2018-02-28 (×3): qty 0.5

## 2018-02-28 MED ORDER — MIDAZOLAM HCL 2 MG/2ML IJ SOLN
2.0000 mg | Freq: Once | INTRAMUSCULAR | Status: AC
  Administered 2018-02-28: 2 mg via INTRAVENOUS

## 2018-02-28 MED ORDER — ENOXAPARIN SODIUM 40 MG/0.4ML ~~LOC~~ SOLN: 40.0000 mg | SUBCUTANEOUS | Status: DC

## 2018-02-28 MED ORDER — MIDAZOLAM HCL 5 MG/5ML IJ SOLN
INTRAMUSCULAR | Status: DC | PRN
  Administered 2018-02-28: 2 mg via INTRAVENOUS

## 2018-02-28 MED ORDER — ONDANSETRON HCL 4 MG/2ML IJ SOLN: 4.0000 mg | Freq: Four times a day (QID) | INTRAMUSCULAR | Status: DC | PRN

## 2018-02-28 MED ORDER — MIDAZOLAM HCL 2 MG/2ML IJ SOLN
1.0000 mg | INTRAMUSCULAR | Status: DC | PRN
  Administered 2018-03-02: 1 mg via INTRAVENOUS
  Filled 2018-02-28: qty 2

## 2018-02-28 MED ORDER — FENTANYL CITRATE (PF) 100 MCG/2ML IJ SOLN
INTRAMUSCULAR | Status: DC | PRN
  Administered 2018-02-28 (×2): 25 ug via INTRAVENOUS
  Administered 2018-02-28: 100 ug via INTRAVENOUS
  Administered 2018-02-28 (×4): 50 ug via INTRAVENOUS

## 2018-02-28 MED ORDER — LEVALBUTEROL HCL 1.25 MG/0.5ML IN NEBU
1.2500 mg | INHALATION_SOLUTION | Freq: Four times a day (QID) | RESPIRATORY_TRACT | Status: DC
  Administered 2018-02-28: 1.25 mg via RESPIRATORY_TRACT
  Filled 2018-02-28: qty 0.5

## 2018-02-28 MED ORDER — MIDAZOLAM HCL 2 MG/2ML IJ SOLN
INTRAMUSCULAR | Status: AC
  Administered 2018-02-28: 1 mg via INTRAVENOUS
  Filled 2018-02-28: qty 2

## 2018-02-28 MED ORDER — BUPIVACAINE LIPOSOME 1.3 % IJ SUSP
INTRAMUSCULAR | Status: AC
  Filled 2018-02-28: qty 20

## 2018-02-28 MED ORDER — MIDAZOLAM HCL 2 MG/2ML IJ SOLN
1.0000 mg | INTRAMUSCULAR | Status: DC | PRN
  Administered 2018-02-28: 1 mg via INTRAVENOUS

## 2018-02-28 MED ORDER — BUPIVACAINE LIPOSOME 1.3 % IJ SUSP
INTRAMUSCULAR | Status: DC | PRN
  Administered 2018-02-28: 20 mL

## 2018-02-28 MED ORDER — ENOXAPARIN SODIUM 30 MG/0.3ML ~~LOC~~ SOLN
30.0000 mg | SUBCUTANEOUS | Status: DC
  Administered 2018-03-01 – 2018-03-02 (×2): 30 mg via SUBCUTANEOUS
  Filled 2018-02-28 (×2): qty 0.3

## 2018-02-28 MED ORDER — PHENYLEPHRINE HCL-NACL 10-0.9 MG/250ML-% IV SOLN
0.0000 ug/min | INTRAVENOUS | Status: DC
  Administered 2018-02-28: 130 ug/min via INTRAVENOUS
  Administered 2018-02-28: 140 ug/min via INTRAVENOUS
  Administered 2018-02-28: 130 ug/min via INTRAVENOUS
  Administered 2018-02-28: 100 ug/min via INTRAVENOUS
  Administered 2018-02-28: 120 ug/min via INTRAVENOUS
  Administered 2018-03-01: 130 ug/min via INTRAVENOUS
  Administered 2018-03-01: 70 ug/min via INTRAVENOUS
  Administered 2018-03-01: 100 ug/min via INTRAVENOUS
  Administered 2018-03-01: 90 ug/min via INTRAVENOUS
  Administered 2018-03-01: 130 ug/min via INTRAVENOUS
  Filled 2018-02-28: qty 1000
  Filled 2018-02-28 (×2): qty 250

## 2018-02-28 MED ORDER — METHYLPREDNISOLONE SODIUM SUCC 125 MG IJ SOLR
60.0000 mg | Freq: Three times a day (TID) | INTRAMUSCULAR | Status: DC
  Administered 2018-02-28 – 2018-03-02 (×6): 60 mg via INTRAVENOUS
  Filled 2018-02-28 (×6): qty 2

## 2018-02-28 MED ORDER — ONDANSETRON 4 MG PO TBDP: 4.0000 mg | ORAL_TABLET | Freq: Four times a day (QID) | ORAL | Status: DC | PRN

## 2018-02-28 MED ORDER — LACTATED RINGERS IV SOLN
INTRAVENOUS | Status: DC
  Administered 2018-02-28 (×3): via INTRAVENOUS

## 2018-02-28 MED ORDER — AMIODARONE HCL IN DEXTROSE 360-4.14 MG/200ML-% IV SOLN
60.0000 mg/h | INTRAVENOUS | Status: DC
  Administered 2018-02-28 (×2): 60 mg/h via INTRAVENOUS
  Filled 2018-02-28: qty 200

## 2018-02-28 MED ORDER — SUCCINYLCHOLINE CHLORIDE 200 MG/10ML IV SOSY
PREFILLED_SYRINGE | INTRAVENOUS | Status: AC
  Filled 2018-02-28: qty 10

## 2018-02-28 MED ORDER — SODIUM CHLORIDE 0.9 % IV SOLN: INTRAVENOUS | Status: DC | PRN

## 2018-02-28 MED ORDER — BUDESONIDE 0.5 MG/2ML IN SUSP
0.5000 mg | Freq: Two times a day (BID) | RESPIRATORY_TRACT | Status: DC
  Administered 2018-02-28 – 2018-03-12 (×25): 0.5 mg via RESPIRATORY_TRACT
  Filled 2018-02-28 (×25): qty 2

## 2018-02-28 MED ORDER — IPRATROPIUM BROMIDE 0.02 % IN SOLN
0.5000 mg | Freq: Four times a day (QID) | RESPIRATORY_TRACT | Status: DC
  Administered 2018-02-28 – 2018-03-06 (×26): 0.5 mg via RESPIRATORY_TRACT
  Filled 2018-02-28 (×26): qty 2.5

## 2018-02-28 MED ORDER — EPHEDRINE 5 MG/ML INJ
INTRAVENOUS | Status: AC
  Filled 2018-02-28: qty 10

## 2018-02-28 MED ORDER — PROPOFOL 10 MG/ML IV BOLUS
INTRAVENOUS | Status: DC | PRN
  Administered 2018-02-28: 70 mg via INTRAVENOUS

## 2018-02-28 MED ORDER — ROCURONIUM BROMIDE 100 MG/10ML IV SOLN
INTRAVENOUS | Status: DC | PRN
  Administered 2018-02-28 (×2): 30 mg via INTRAVENOUS

## 2018-02-28 MED ORDER — FENTANYL CITRATE (PF) 100 MCG/2ML IJ SOLN
50.0000 ug | INTRAMUSCULAR | Status: DC | PRN
  Administered 2018-02-28 – 2018-03-01 (×4): 50 ug via INTRAVENOUS
  Filled 2018-02-28 (×4): qty 2

## 2018-02-28 MED ORDER — SODIUM CHLORIDE 0.9 % IV SOLN
INTRAVENOUS | Status: DC
  Administered 2018-02-28 – 2018-03-10 (×5): via INTRAVENOUS

## 2018-02-28 MED ORDER — LORAZEPAM 2 MG/ML IJ SOLN
1.0000 mg | INTRAMUSCULAR | Status: DC | PRN
  Administered 2018-03-03 – 2018-03-05 (×3): 1 mg via INTRAVENOUS
  Filled 2018-02-28 (×3): qty 1

## 2018-02-28 SURGICAL SUPPLY — 46 items
BARRIER SKIN 2 3/4 (OSTOMY) ×2 IMPLANT
BARRIER SKIN 2 3/4 INCH (OSTOMY) ×1
BARRIER SKIN OD2.25 2 3/4 FLNG (OSTOMY) IMPLANT
CHLORAPREP W/TINT 26ML (MISCELLANEOUS) ×3 IMPLANT
CLAMP POUCH DRAINAGE QUIET (OSTOMY) IMPLANT
CLOTH BEACON ORANGE TIMEOUT ST (SAFETY) ×3 IMPLANT
COVER LIGHT HANDLE STERIS (MISCELLANEOUS) ×6 IMPLANT
COVER WAND RF STERILE (DRAPES) ×2 IMPLANT
DRAPE WARM FLUID 44X44 (DRAPE) ×3 IMPLANT
ELECT REM PT RETURN 9FT ADLT (ELECTROSURGICAL) ×3
ELECTRODE REM PT RTRN 9FT ADLT (ELECTROSURGICAL) ×1 IMPLANT
GAUZE 4X4 16PLY RFD (DISPOSABLE) ×2 IMPLANT
GAUZE PACKING IODOFORM 1X5 (MISCELLANEOUS) ×2 IMPLANT
GAUZE SPONGE 4X4 12PLY STRL (GAUZE/BANDAGES/DRESSINGS) ×3 IMPLANT
GLOVE BIO SURGEON STRL SZ 6.5 (GLOVE) ×1 IMPLANT
GLOVE BIO SURGEONS STRL SZ 6.5 (GLOVE) ×1
GLOVE BIOGEL PI IND STRL 7.0 (GLOVE) ×1 IMPLANT
GLOVE BIOGEL PI INDICATOR 7.0 (GLOVE) ×2
GLOVE SURG SS PI 7.0 STRL IVOR (GLOVE) ×2 IMPLANT
GLOVE SURG SS PI 7.5 STRL IVOR (GLOVE) ×8 IMPLANT
GOWN STRL REUS W/TWL LRG LVL3 (GOWN DISPOSABLE) ×9 IMPLANT
HANDLE SUCTION POOLE (INSTRUMENTS) ×1 IMPLANT
INST SET MAJOR GENERAL (KITS) ×3 IMPLANT
KIT TURNOVER KIT A (KITS) ×3 IMPLANT
LIGASURE IMPACT 36 18CM CVD LR (INSTRUMENTS) ×3 IMPLANT
MANIFOLD NEPTUNE II (INSTRUMENTS) ×3 IMPLANT
NDL HYPO 21X1.5 SAFETY (NEEDLE) ×1 IMPLANT
NEEDLE HYPO 21X1.5 SAFETY (NEEDLE) ×3 IMPLANT
NS IRRIG 1000ML POUR BTL (IV SOLUTION) ×12 IMPLANT
PACK ABDOMINAL MAJOR (CUSTOM PROCEDURE TRAY) ×3 IMPLANT
PAD ABD 5X9 TENDERSORB (GAUZE/BANDAGES/DRESSINGS) ×2 IMPLANT
PAD ARMBOARD 7.5X6 YLW CONV (MISCELLANEOUS) ×3 IMPLANT
SET BASIN LINEN APH (SET/KITS/TRAYS/PACK) ×3 IMPLANT
SPONGE LAP 18X18 RF (DISPOSABLE) ×9 IMPLANT
STAPLER CUT CVD 40MM GREEN (STAPLE) ×2 IMPLANT
STAPLER PROXIMATE 75MM BLUE (STAPLE) ×2 IMPLANT
STAPLER VISISTAT (STAPLE) ×3 IMPLANT
STAPLER VISISTAT 35W (STAPLE) ×2 IMPLANT
SUCTION POOLE HANDLE (INSTRUMENTS) ×3
SUT CHROMIC 0 SH (SUTURE) ×3 IMPLANT
SUT CHROMIC 3 0 SH 27 (SUTURE) ×5 IMPLANT
SUT NOVA NAB GS-26 0 60 (SUTURE) ×4 IMPLANT
SUT SILK 3 0 SH CR/8 (SUTURE) ×2 IMPLANT
SWAB COLLECTION DEVICE MRSA (MISCELLANEOUS) ×2 IMPLANT
SYRINGE 20CC LL (MISCELLANEOUS) ×3 IMPLANT
TRAY FOLEY CATH SILVER 16FR (SET/KITS/TRAYS/PACK) ×3 IMPLANT

## 2018-02-28 NOTE — Interval H&P Note (Signed)
History and Physical Interval Note:  02/28/2018 1:17 PM  Regina Hall  has presented today for surgery, with the diagnosis of perforated bowel  The various methods of treatment have been discussed with the patient and family. After consideration of risks, benefits and other options for treatment, the patient has consented to  Procedure(s): COLECTOMY WITH COLOSTOMY CREATION/HARTMANN PROCEDURE (N/A) as a surgical intervention .  The patient's history has been reviewed, patient examined, no change in status, stable for surgery.  I have reviewed the patient's chart and labs.  Questions were answered to the patient's satisfaction.     [name]

## 2018-02-28 NOTE — H&P (View-Only) (Signed)
7 Days Post-Op  Subjective: Patient has developed more abdominal pain.  Objective: Vital signs in last 24 hours: Temp:  [98.5 F (36.9 C)-98.9 F (37.2 C)] 98.5 F (36.9 C) (02/14 0438) Pulse Rate:  [111-122] 111 (02/14 0438) Resp:  [19-24] 20 (02/14 0438) BP: (108-129)/(47-69) 129/47 (02/14 0438) SpO2:  [92 %-97 %] 97 % (02/14 0438) Last BM Date: 02/27/18  Intake/Output from previous day: 02/13 0701 - 02/14 0700 In: 240 [P.O.:240] Out: -  Intake/Output this shift: No intake/output data recorded.  General appearance: alert, cooperative and fatigued GI: Tender throughout abdomen.  No bowel sounds appreciated.  Distended.  Lab Results:  Recent Labs    02/27/18 0552 02/28/18 0601  WBC 10.1 26.2*  HGB 13.1 13.1  HCT 41.1 40.1  PLT 332 328   BMET Recent Labs    02/27/18 0552 02/28/18 0601  NA 135 133*  K 3.9 4.5  CL 102 96*  CO2 23 24  GLUCOSE 158* 100*  BUN 17 31*  CREATININE 1.21* 2.23*  CALCIUM 8.5* 8.8*   PT/INR No results for input(s): LABPROT, INR in the last 72 hours.  Studies/Results: Ct Abdomen Pelvis Wo Contrast  Result Date: 02/27/2018 CLINICAL DATA:  Follow-up diverticulitis. Chronic abdominal pain. EXAM: CT ABDOMEN AND PELVIS WITHOUT CONTRAST TECHNIQUE: Multidetector CT imaging of the abdomen and pelvis was performed following the standard protocol without IV contrast. COMPARISON:  02/20/2018 FINDINGS: Lower chest: New small bilateral pleural effusions and bibasilar atelectasis. Hepatobiliary: Stable small cyst in left hepatic lobe. No mass visualized on this unenhanced exam. Tiny gallstones again noted, however there is no evidence of cholecystitis or biliary ductal dilatation. Pancreas: No mass or inflammatory process visualized on this unenhanced exam. Spleen:  Within normal limits in size. Adrenals/Urinary tract: No evidence of urolithiasis or hydronephrosis. Unremarkable unopacified urinary bladder. Stomach/Bowel: New small amount of free  intraperitoneal air is seen, consistent with bowel perforation. Mild ascites is new since previous study, as well as diffuse mesenteric and body wall edema. Large amount of stool is again seen throughout the colon. A transition point is again seen near the rectosigmoid junction, consistent with stricture. No definite soft tissue mass appreciated by CT. Mild diverticulosis and wall thickening is seen involving the proximal sigmoid, which is similar to previous study and suspicious for mild diverticulitis. No evidence of small bowel wall thickening or obstruction. No abscess identified. Vascular/Lymphatic: No pathologically enlarged lymph nodes identified. No evidence of abdominal aortic aneurysm. Reproductive: Prior hysterectomy. Limited visualization due to artifact from left hip prosthesis. Other:  None. Musculoskeletal:  No suspicious bone lesions identified. IMPRESSION: 1. New small amount of free intraperitoneal air, consistent with bowel perforation. 2. Large colonic stool burden with transition point near the rectosigmoid junction, consistent with stricture. No definite soft tissue mass appreciated by CT. This is unchanged in appearance compared to previous study. Recommend correlation with colonoscopy to exclude obstructing colon carcinoma. 3. Mild diverticulitis of proximal sigmoid colon, without significant change. 4. New mild ascites and diffuse body wall edema. New small bilateral pleural effusions and bibasilar atelectasis. 5. Cholelithiasis. No radiographic evidence of cholecystitis. Critical Value/emergent results were called by telephone at the time of interpretation on 02/27/2018 at 4:35 pm to Dr. Shanon Brow TAT , who verbally acknowledged these results. Electronically Signed   By: Earle Gell M.D.   On: 02/27/2018 16:38   Dg Chest Port 1 View  Result Date: 02/26/2018 CLINICAL DATA:  84 year old female with intermittent productive cough EXAM: PORTABLE CHEST 1 VIEW COMPARISON:  02/28/2016 FINDINGS:  Cardiomediastinal silhouette unchanged. Interval development of asymmetric elevation the right hemidiaphragm. No pneumothorax. Low lung volumes with coarsened interstitial markings and interlobular septal thickening. No large pleural effusion. No confluent airspace disease. Interval placement of right upper extremity PICC with the tip appearing to terminate superior vena cava. Degenerative changes of the left glenohumeral joint. IMPRESSION: Low lung volumes with questionable edema. New asymmetric elevation the right hemidiaphragm, uncertain significance. Right upper extremity PICC. Electronically Signed   By: Corrie Mckusick D.O.   On: 02/26/2018 19:28    Anti-infectives: Anti-infectives (From admission, onward)   Start     Dose/Rate Route Frequency Ordered Stop   02/27/18 1800  ertapenem (INVANZ) 1,000 mg in sodium chloride 0.9 % 100 mL IVPB     1 g 200 mL/hr over 30 Minutes Intravenous Every 24 hours 02/27/18 1705     02/21/18 0800  ciprofloxacin (CIPRO) IVPB 400 mg  Status:  Discontinued     400 mg 200 mL/hr over 60 Minutes Intravenous Every 12 hours 02/20/18 2204 02/27/18 1908   02/21/18 0500  metroNIDAZOLE (FLAGYL) IVPB 500 mg  Status:  Discontinued     500 mg 100 mL/hr over 60 Minutes Intravenous Every 8 hours 02/20/18 2204 02/27/18 1908   02/20/18 2115  ciprofloxacin (CIPRO) IVPB 400 mg     400 mg 200 mL/hr over 60 Minutes Intravenous  Once 02/20/18 2113 02/20/18 2335   02/20/18 2115  metroNIDAZOLE (FLAGYL) tablet 500 mg     500 mg Oral  Once 02/20/18 2113 02/20/18 2147      Assessment/Plan: Impression: Perforated bowel with worsening renal failure and leukocytosis. Plan: I had extensive discussion with the patient concerning the need to go to the operating room for a partial colectomy with colostomy.  I did tell her that I could possibly take down the colostomy in the future.  She was told that if she did not have surgery, she could die.  She is agreed to a partial colectomy with  colostomy.  The risks and benefits of the procedure including bleeding, infection, cardiopulmonary difficulties, and death were fully explained to the patient, who gave informed consent.  She is a high risk surgical candidate.  She may remain intubated after the procedure.  She will be transferred to the ICU after surgery.  LOS: 8 days    [name] 02/28/2018

## 2018-02-28 NOTE — Progress Notes (Signed)
Notified Dr. Carles Collet of low urinary output. Patient has put out 35 mls over 4 hours. Order for albumin given. Will continue to monitor.

## 2018-02-28 NOTE — Procedures (Signed)
**Note De-Identified Quillan Whitter Obfuscation** Arterial Catheter Insertion Procedure Note AYAUNA MCNAY [id number] January 05, 1935  Procedure: Insertion of Arterial Catheter  Indications: Blood pressure monitoring and Frequent blood sampling  Procedure Details Consent: Risks of procedure as well as the alternatives and risks of each were explained to the (patient/caregiver).  Consent for procedure obtained. Time Out: Verified patient identification, verified procedure, site/side was marked, verified correct patient position, special equipment/implants available, medications/allergies/relevent history reviewed, required imaging and test results available.  Performed  Maximum sterile technique was used including antiseptics, cap, gloves, gown, hand hygiene, mask and sheet. Skin prep: Chlorhexidine; local anesthetic administered 20 gauge catheter was inserted into left radial artery using the Seldinger technique. ULTRASOUND GUIDANCE USED: NO Evaluation Blood flow good; BP tracing good. Complications: No apparent complications.  BP 165/57  Damian Buckles, Penni Bombard 02/28/2018

## 2018-02-28 NOTE — Addendum Note (Signed)
Addendum  created 02/28/18 1619 by Ollen Bowl, CRNA   Charge Capture section accepted, Intraprocedure Meds edited

## 2018-02-28 NOTE — Anesthesia Postprocedure Evaluation (Signed)
Anesthesia Post Note  Patient: Regina Hall  Procedure(s) Performed: PARTIAL COLECTOMY WITH COLOSTOMY (N/A )  Patient location during evaluation: A-ICU Anesthesia Type: General Level of consciousness: sedated Pain management: pain level controlled Vital Signs Assessment: post-procedure vital signs reviewed and stable Respiratory status: patient on ventilator - see flowsheet for VS Cardiovascular status: tachycardic Postop Assessment: no apparent nausea or vomiting Anesthetic complications: no     Last Vitals:  Vitals:   02/28/18 1308 02/28/18 1535  BP: (!) 136/55   Pulse: (!) 103   Resp: (!) 24   Temp: 36.9 C   SpO2:  98%    Last Pain:  Vitals:   02/28/18 1308  TempSrc: Oral  PainSc: 0-No pain                 Trenee Igoe

## 2018-02-28 NOTE — Op Note (Signed)
Patient:  Regina Hall  DOB:  07/21/1934  MRN:  [id number]   Preop Diagnosis: Bowel perforation, pneumoperitoneum  Postop Diagnosis: Same, fecal peritonitis  Procedure: Hartman's procedure  Surgeon: [name], MD  Assistant: Blake Divine, MD  Anes: General endotracheal  Indications: Patient is an 84 year old white female with a colonic obstruction secondary to a distal colorectal stricture presumably secondary to diverticulitis who has developed pneumoperitoneum.  Initially, we tried to handle this with bowel rest, but she has worsened overnight and now presents to the operating room for a partial colectomy with colostomy.  The risks and benefits of the procedure including bleeding, infection, cardiopulmonary difficulties, colostomy formation, the need to remain intubated postoperatively, and the possibility of death were fully explained to the patient, who gave informed consent.  Procedure note: The patient was placed in the supine position.  After induction of general endotracheal anesthesia, the abdomen was prepped and draped using the usual sterile technique with ChloraPrep.  Surgical site confirmation was performed.  Midline incision was made from the umbilicus inferiorly to the suprapubic region.  The peritoneal cavity was entered into without difficulty.  Edematous soft tissue was noted.  Immediately we found cloudy peritoneal fluid.  Anaerobic and aerobic cultures were taken and sent to microbiology.  On further exploration, the pelvis had stool present outside the lumen.  It appeared that the distal sigmoid colon had necrosis and there was a large perforation along the antimesenteric border.  The distal sigmoid colon was noted to be diffusely thickened.  It was mobilized along its peritoneal reflection.  Care was taken to avoid the left ureter.  A GIA 75 stapler was placed across the mid sigmoid colon and fired.  The mesentery was then divided down to the colorectal juncture.  A  contour 60 TA stapler was then placed at a point distal to the stricture and fired.  The colon was then removed and sent to the pathologist for further examination and treatment.  Multiple liters of fluid were used to clean out the stool and purulent fluid.  The descending colon was then mobilized along its peritoneal reflection.  Interestingly, there was a hard plaque-like lesion in the omentum and this was biopsied and sent to pathology for further examination.  A colostomy was then formed just to the left of the umbilicus.  The distal sigmoid colon was then brought out through the cruciate incision.  The bowel was inspected and noted to be within normal limits.  No other areas of stool collection were seen.  All operating personnel then changed their gloves.  The fascia was reapproximated using a looped 0 Novafil running suture.  Subcutaneous layer was irrigated with normal saline.  Exparel was instilled into the midline incision.  The incision was then packed with iodoform Nu Gauze and a wick-like manner.  3 staples were then used to reapproximate the skin, though the skin was primarily left open.  A dry sterile dressing was applied.  The colostomy was then matured using 3-0 Chromic Gut interrupted sutures.  Slight duskiness was noted of the mucosa.  Her blood pressure was low and unstable.  She had received several injections of Neo-Synephrine.  All tape and needle counts were correct at the end of the procedure.  The patient was left intubated and transferred to the ICU in critical condition.  A son who was present was notified of the critical nature of her illness.  Complications: None  EBL: 100 cc  Specimen: Distal sigmoid colon, omentum

## 2018-02-28 NOTE — Progress Notes (Signed)
Dr. Constance Haw notified of urine output of 57ml since surgery. New orders received for ns bolus.

## 2018-02-28 NOTE — Progress Notes (Signed)
Responded to nursing call:  Tachycardia and hypotension   Subjective: Pt sedated on vent  Vitals:   02/28/18 1700 02/28/18 1715 02/28/18 1720 02/28/18 1800  BP: (!) 80/31 (!) 81/24    Pulse: (!) 126 (!) 126 (!) 123 (!) 112  Resp: 20 (!) 24 20 17   Temp:   99 F (37.2 C)   TempSrc:   Axillary   SpO2: 99% 97% 99% 100%  Weight:      Height:       CV--irregular Lung--bilateral scattered rales Abd--soft, hypoactive bowel sounds   Assessment/Plan: New onset Afib -start amiodarone given hypotension -case discussed with Dr. Arnoldo Morale -neosynephrine started -if no improvement or stablization with amio, may need DC cardioversion -hold heparin IV as pt is immediately post op and having identifiable cause for afib -order echo       Orson Eva, DO [facility]

## 2018-02-28 NOTE — Anesthesia Procedure Notes (Signed)
Procedure Name: Intubation Date/Time: 02/28/2018 1:32 PM Performed by: Andree Elk, Angelika Jerrett A, CRNA Pre-anesthesia Checklist: Patient identified, Patient being monitored, Timeout performed, Emergency Drugs available and Suction available Patient Re-evaluated:Patient Re-evaluated prior to induction Oxygen Delivery Method: Circle system utilized Preoxygenation: Pre-oxygenation with 100% oxygen Induction Type: IV induction Ventilation: Mask ventilation without difficulty Laryngoscope Size: Glidescope and 3 Grade View: Grade I Tube type: Oral Tube size: 7.0 mm Number of attempts: 1 Airway Equipment and Method: Stylet Placement Confirmation: ETT inserted through vocal cords under direct vision,  positive ETCO2 and breath sounds checked- equal and bilateral Secured at: 21 cm Tube secured with: Tape Dental Injury: Teeth and Oropharynx as per pre-operative assessment

## 2018-02-28 NOTE — Consult Note (Addendum)
Referring Provider: No ref. provider found Primary Care Physician:  [name], DO Primary Nephrologist:    Reason for Consultation: Acute kidney injury, nonoliguric, hyponatremia, anemia,  HPI: This is a very pleasant 84 year old lady history of hypertension, she has a history of a DVT in the left leg and status post IVC filter presented to the hospital 02/20/2018 with abdominal pain.  CT scan of the abdomen pelvis was positive for diverticulitis and large amount of stool throughout the colon and perforation.  Sigmoidoscopy was positive for obstruction that was positive for tubular adenoma in the sigmoid colon   She is scheduled for surgery with Dr. Arnoldo Morale today.  02/28/2018  Her diverticulitis was treated with Flagyl that was discontinued on 02/27/2018 and Cipro that was discontinued on 02/27/2018.  She is now receiving IV Invanz 1 g every 24 hours since 02/27/2018.  Reviewing her vital signs it appears that there has been some hemodynamic instability with some relatively lower pressures.  But no frank shock.  She had developed fever and blood cultures were sent 02/27/2018.  Chest x-ray was positive for increased interstitial markings with low lung volumes 02/26/2018.  She does have COPD and has been treated with nebulizers and has been started on Solu-Medrol 60 mg every 8 hours 02/28/2018  IV Lasix administered 02/27/2018 40 mg.  Her creatinine was 0.9 02/26/2018                                 1.2 02/27/2018                                 2.23 02/28/2018.  Blood pressure 129/47 pulse 111 temperature 98.5 O2 sats 97% 2 L nasal cannula   Urine output 1.5 L 02/26/1998 2450 cc 02/27/2018.  No recorded weight last weight was 110.2 kg measured on admission  Sodium 133 potassium 4.5 chloride 96 CO2 24 glucose 100 BUN 31 creatinine 2.23 calcium 8.8 magnesium 2.5. WBC 26.2 hemoglobin 13.1 platelets 328 Urinalysis negative for protein 0-5 WBCs 6-10 RBCs.  She has an external urinary catheter  CT  scan 12/28/2018 small amount of free intraperitoneal air, large colonic stool burden mild diverticulitis mild ascites cholelithiasis                                     Past Medical History:  Diagnosis Date  . Anemia   . Anxiety    pt denies  . Arthritis    KNEES AND BACK  . Bladder incontinence   . Cancer ([place])    basal cell cancer on nose  . Edema of both feet   . Hypertension   . Pneumonia   . PONV (postoperative nausea and vomiting)    PONV X 1 EPISODE, COMES OUT OF ANESTHESIA FAST, SOME AWARENESS DURING END OF COLONSCOPY  . Scoliosis     Past Surgical History:  Procedure Laterality Date  . ABDOMINAL HYSTERECTOMY     2006 VAG HYST  . BACK SURGERY     LOWER, X2  . BIOPSY Right 05/02/2017   Procedure: BIOPSY OF RIGHT UPPER EYELID LESION;  Surgeon: [name], MD;  Location: [place];  Service: Ophthalmology;  Laterality: Right;  . EYE SURGERY Bilateral    cataract surgery with lens implant  . FLEXIBLE SIGMOIDOSCOPY N/A  02/21/2018   Procedure: FLEXIBLE SIGMOIDOSCOPY;  Surgeon: [name], MD;  [facility];  Service: Endoscopy;  Laterality: N/A;  . JOINT REPLACEMENT     2011 LT HIP  . POLYPECTOMY  02/21/2018   Procedure: POLYPECTOMY;  Surgeon: [name], MD;  [facility];  Service: Endoscopy;;  rectum  . RECONSTRUCTION OF EYELID Right 05/02/2017   Procedure: TOTAL RECONSTRUCTION OF UPPER EYELID RIGHT EYE WITH FULL THICKNESS SKIN GRAFT FROM RIGHT POSTERIOR EAR;  Surgeon: [name], MD;  Location: [place];  Service: Ophthalmology;  Laterality: Right;  . TONSILLECTOMY    . TOTAL KNEE ARTHROPLASTY Right 04/27/2014   Procedure: [facility] TOTAL KNEE ARTHROPLASTY;  Surgeon: Rod Can, MD;  Location: [facility];  Service: Orthopedics;  Laterality: Right;  . TUBAL LIGATION     1974  . VENA CAVA FILTER PLACEMENT N/A 06/04/2017   Procedure: INSERTION VENA-CAVA FILTER;  Surgeon: Angelia Mould, MD;  Location: [facility];  Service: Vascular;   Laterality: N/A;    Prior to Admission medications   Medication Sig Start Date End Date Taking? Authorizing Provider  aspirin EC 81 MG tablet Take 81 mg by mouth daily.   Yes [provider]  Biotin 1 MG CAPS Take by mouth.   Yes [provider]  Cholecalciferol (VITAMIN D3) 5000 units TABS Take 5,000 Units by mouth daily.   Yes [provider]  CRANBERRY PO Take by mouth.   Yes [provider]  Docusate Sodium (STOOL SOFTENER LAXATIVE PO) Take 2 tablets by mouth every 6 (six) hours as needed.   Yes [provider]  furosemide (LASIX) 20 MG tablet Take 40mg  twice daily, at 6am and 2pm 01/28/18  Yes Eustaquio Maize, MD  GLUCOSAMINE SULFATE PO Take 2 tablets by mouth daily.    Yes [provider]  Hypromellose (ARTIFICIAL TEARS OP) Place 2 drops into both eyes daily as needed (for dry eyes).   Yes [provider]  losartan (COZAAR) 25 MG tablet TAKE 1 TABLET(25 MG) BY MOUTH EVERY MORNING 12/13/17  Yes Eustaquio Maize, MD  Magnesium 250 MG TABS Take by mouth.   Yes [provider]  magnesium hydroxide (MILK OF MAGNESIA) 400 MG/5ML suspension Take 15 mLs by mouth daily as needed for mild constipation. 02/20/18  Yes Dettinger, Fransisca Kaufmann, MD  montelukast (SINGULAIR) 10 MG tablet Take 10 mg by mouth every morning. 07/06/17  Yes [provider]  polyethylene glycol powder (GLYCOLAX/MIRALAX) powder Take 17 g by mouth 2 (two) times daily as needed. 02/20/18  Yes Dettinger, Fransisca Kaufmann, MD  vitamin B-12 (CYANOCOBALAMIN) 500 MCG tablet Take 500 mcg by mouth daily.   Yes [provider]  HYDROcodone-acetaminophen (NORCO) 10-325 MG tablet Take 1 tablet by mouth every 6 (six) hours as needed. 01/28/18   Eustaquio Maize, MD    Current Facility-Administered Medications  Medication Dose Route Frequency Provider Last Rate Last Dose  . 0.9 %  sodium chloride infusion   Intravenous Continuous [name], MD 125 mL/hr at 02/28/18  0831    . acetaminophen (TYLENOL) tablet 650 mg  650 mg Oral Q6H PRN Oswald Hillock, MD       Or  . acetaminophen (TYLENOL) suppository 650 mg  650 mg Rectal Q6H PRN Oswald Hillock, MD      . budesonide (PULMICORT) nebulizer solution 0.5 mg  0.5 mg Nebulization BID Tat, Shanon Brow, MD   0.5 mg at 02/28/18 0859  . Chlorhexidine Gluconate  Cloth 2 % PADS 6 each  6 each Topical Once [name], MD       And  . Chlorhexidine Gluconate Cloth 2 % PADS 6 each  6 each Topical Once [name], MD      . ertapenem [facility]) 1,000 mg in sodium chloride 0.9 % 100 mL IVPB  1 g Intravenous Q24H [name], MD 200 mL/hr at 02/27/18 1826 1,000 mg at 02/27/18 1826  . hydrALAZINE (APRESOLINE) injection 5 mg  5 mg Intravenous Q6H PRN Tat, David, MD      . HYDROmorphone (DILAUDID) injection 1 mg  1 mg Intravenous Q4H PRN Oswald Hillock, MD   1 mg at 02/27/18 1130  . iohexol (OMNIPAQUE) 300 MG/ML solution 100 mL  100 mL Intravenous Once PRN Tat, David, MD      . iopamidol (ISOVUE-300) 61 % injection 30 mL  30 mL Oral Once PRN Tat, Shanon Brow, MD      . ipratropium (ATROVENT) nebulizer solution 0.5 mg  0.5 mg Nebulization Q6H Orson Eva, MD   0.5 mg at 02/28/18 0859  . levalbuterol (XOPENEX) nebulizer solution 0.63 mg  0.63 mg Nebulization Q8H PRN Opyd, Ilene Qua, MD   0.63 mg at 02/28/18 0352  . levalbuterol (XOPENEX) nebulizer solution 1.25 mg  1.25 mg Nebulization Q6H Tat, David, MD      . magnesium hydroxide (MILK OF MAGNESIA) suspension 30 mL  30 mL Oral BID [name], MD   30 mL at 02/27/18 1007  . methylPREDNISolone sodium succinate (SOLU-MEDROL) 125 mg/2 mL injection 60 mg  60 mg Intravenous Franco Collet, MD   60 mg at 02/28/18 0831  . mupirocin ointment (BACTROBAN) 2 % 1 application  1 application Nasal BID [name], MD      . ondansetron [facility]) tablet 4 mg  4 mg Oral Q6H PRN Oswald Hillock, MD       Or  . ondansetron (ZOFRAN) injection 4 mg  4 mg Intravenous Q6H PRN Oswald Hillock, MD      .  oxyCODONE-acetaminophen (PERCOCET/ROXICET) 5-325 MG per tablet 1 tablet  1 tablet Oral Q6H PRN Kathie Dike, MD   1 tablet at 02/27/18 1030  . pantoprazole (PROTONIX) injection 40 mg  40 mg Intravenous Q12H Tat, David, MD      . phenol (CHLORASEPTIC) mouth spray 1 spray  1 spray Mouth/Throat PRN Oswald Hillock, MD   1 spray at 02/23/18 0325  . polyethylene glycol (MIRALAX / GLYCOLAX) packet 17 g  17 g Oral BID [name], MD   17 g at 02/27/18 1007  . sodium chloride flush (NS) 0.9 % injection 10-40 mL  10-40 mL Intracatheter PRN Kathie Dike, MD       Facility-Administered Medications Ordered in Other Encounters  Medication Dose Route Frequency Provider Last Rate Last Dose  . ondansetron (ZOFRAN) 4 mg in sodium chloride 0.9 % 50 mL IVPB  4 mg Intravenous Once Rod Can, MD        Allergies as of 02/20/2018 - Review Complete 02/20/2018  Allergen Reaction Noted  . Penicillins Rash and Other (See Comments) 07/17/2011  . Vancomycin  01/28/2018    Family History  Problem Relation Age of Onset  . Congestive Heart Failure Mother   . Colon cancer Father   . Cancer Brother   . Arthritis Sister   . Asthma Sister   . Diabetes Son   . Diabetes Son     Social History   Socioeconomic History  .  Marital status: Married    Spouse name: Not on file  . Number of children: 3  . Years of education: 98  . Highest education level: Associate degree: occupational, Hotel manager, or vocational program  Occupational History  . Occupation: Retired    Comment: Corporate treasurer  Social Needs  . Financial resource strain: Somewhat hard  . Food insecurity:    Worry: Never true    Inability: Never true  . Transportation needs:    Medical: No    Non-medical: No  Tobacco Use  . Smoking status: Never Smoker  . Smokeless tobacco: Never Used  Substance and Sexual Activity  . Alcohol use: Yes    Comment: OCC WINE  . Drug use: No  . Sexual activity: Not on file  Lifestyle  . Physical  activity:    Days per week: 0 days    Minutes per session: 0 min  . Stress: Only a little  Relationships  . Social connections:    Talks on phone: More than three times a week    Gets together: Once a week    Attends religious service: Never    Active member of club or organization: No    Attends meetings of clubs or organizations: Never    Relationship status: Married  . Intimate partner violence:    Fear of current or ex partner: No    Emotionally abused: No    Physically abused: No    Forced sexual activity: No  Other Topics Concern  . Not on file  Social History Narrative  . Not on file    Review of Systems: Gen: Some fevers chills  HEENT: No visual complaints, No history of Retinopathy. Normal external appearance No Epistaxis or Sore throat. No sinusitis.   CV: Denies chest pain, angina, palpitations, syncope, orthopnea, PND, peripheral edema, and claudication. Resp: Denies dyspnea at rest, dyspnea with exercise, cough, sputum, wheezing, coughing up blood, and pleurisy. GI: Denies vomiting blood, jaundice, and fecal incontinence.   Denies dysphagia or odynophagia. GU : Denies urinary burning, blood in urine, urinary frequency, urinary hesitancy, nocturnal urination, and urinary incontinence.  No renal calculi. MS: Denies joint pain, limitation of movement, and swelling, stiffness, low back pain, extremity pain. Denies muscle weakness, cramps, atrophy.  No use of non steroidal antiinflammatory drugs. Derm: Denies rash, itching, dry skin, hives, moles, warts, or unhealing ulcers.  Psych: Denies depression, anxiety, memory loss, suicidal ideation, hallucinations, paranoia, and confusion. Heme: Denies bruising, bleeding, and enlarged lymph nodes. Neuro: No headache.  No diplopia. No dysarthria.  No dysphasia.  No history of CVA.  No Seizures. No paresthesias.  No weakness. Endocrine No DM.  No Thyroid disease.  No Adrenal disease.  Physical Exam: Vital signs in last 24  hours: Temp:  [98.5 F (36.9 C)-98.9 F (37.2 C)] 98.5 F (36.9 C) (02/14 0438) Pulse Rate:  [111-122] 111 (02/14 0438) Resp:  [19-24] 20 (02/14 0438) BP: (108-129)/(47-69) 129/47 (02/14 0438) SpO2:  [92 %-97 %] 96 % (02/14 0859) Last BM Date: 02/27/18 General:   appears to be in some mild respiratory difficulty to conversation wearing oxygen.  Elderly appears older than stated age Head:  Normocephalic and atraumatic. Eyes:  Sclera clear, no icterus.  Conjunctiva pale Ears:  Normal auditory acuity. Nose:  No deformity, discharge,  or lesions. Mouth:  No deformity or lesions, dentition normal. Neck:  Supple; no masses or thyromegaly. JVP not elevated Lungs: Rhonchorous breath sounds heard throughout lung field Heart:  Regular rate and rhythm; faint  systolic murmur left sternal edge Abdomen: Distended hypoactive bowel sounds  Msk:  Symmetrical without gross deformities. Normal posture. Pulses:  No carotid, renal, femoral bruits. DP and PT symmetrical and equal Extremities:  Without clubbing or edema. Neurologic:  Alert and  oriented x4;  grossly normal neurologically. Skin:  Intact without significant lesions or rashes. Cervical Nodes:  No significant cervical adenopathy. Psych:  Alert and cooperative. Normal mood and affect.  Intake/Output from previous day: 02/13 0701 - 02/14 0700 In: 240 [P.O.:240] Out: -  Intake/Output this shift: No intake/output data recorded.  Lab Results: Recent Labs    02/26/18 0506 02/27/18 0552 02/28/18 0601  WBC 16.1* 10.1 26.2*  HGB 11.2* 13.1 13.1  HCT 34.3* 41.1 40.1  PLT 221 332 328   BMET Recent Labs    02/26/18 0506 02/27/18 0552 02/28/18 0601  NA 134* 135 133*  K 4.0 3.9 4.5  CL 101 102 96*  CO2 25 23 24   GLUCOSE 118* 158* 100*  BUN 14 17 31*  CREATININE 0.90 1.21* 2.23*  CALCIUM 8.3* 8.5* 8.8*   LFT Recent Labs    02/27/18 0552  PROT 5.1*  ALBUMIN 2.3*  AST 17  ALT 11  ALKPHOS 55  BILITOT 0.8   PT/INR No  results for input(s): LABPROT, INR in the last 72 hours. Hepatitis Panel No results for input(s): HEPBSAG, HCVAB, HEPAIGM, HEPBIGM in the last 72 hours.  Studies/Results: Ct Abdomen Pelvis Wo Contrast  Result Date: 02/27/2018 CLINICAL DATA:  Follow-up diverticulitis. Chronic abdominal pain. EXAM: CT ABDOMEN AND PELVIS WITHOUT CONTRAST TECHNIQUE: Multidetector CT imaging of the abdomen and pelvis was performed following the standard protocol without IV contrast. COMPARISON:  02/20/2018 FINDINGS: Lower chest: New small bilateral pleural effusions and bibasilar atelectasis. Hepatobiliary: Stable small cyst in left hepatic lobe. No mass visualized on this unenhanced exam. Tiny gallstones again noted, however there is no evidence of cholecystitis or biliary ductal dilatation. Pancreas: No mass or inflammatory process visualized on this unenhanced exam. Spleen:  Within normal limits in size. Adrenals/Urinary tract: No evidence of urolithiasis or hydronephrosis. Unremarkable unopacified urinary bladder. Stomach/Bowel: New small amount of free intraperitoneal air is seen, consistent with bowel perforation. Mild ascites is new since previous study, as well as diffuse mesenteric and body wall edema. Large amount of stool is again seen throughout the colon. A transition point is again seen near the rectosigmoid junction, consistent with stricture. No definite soft tissue mass appreciated by CT. Mild diverticulosis and wall thickening is seen involving the proximal sigmoid, which is similar to previous study and suspicious for mild diverticulitis. No evidence of small bowel wall thickening or obstruction. No abscess identified. Vascular/Lymphatic: No pathologically enlarged lymph nodes identified. No evidence of abdominal aortic aneurysm. Reproductive: Prior hysterectomy. Limited visualization due to artifact from left hip prosthesis. Other:  None. Musculoskeletal:  No suspicious bone lesions identified. IMPRESSION: 1.  New small amount of free intraperitoneal air, consistent with bowel perforation. 2. Large colonic stool burden with transition point near the rectosigmoid junction, consistent with stricture. No definite soft tissue mass appreciated by CT. This is unchanged in appearance compared to previous study. Recommend correlation with colonoscopy to exclude obstructing colon carcinoma. 3. Mild diverticulitis of proximal sigmoid colon, without significant change. 4. New mild ascites and diffuse body wall edema. New small bilateral pleural effusions and bibasilar atelectasis. 5. Cholelithiasis. No radiographic evidence of cholecystitis. Critical Value/emergent results were called by telephone at the time of interpretation on 02/27/2018 at 4:35 pm to Dr. Shanon Brow TAT ,  who verbally acknowledged these results. Electronically Signed   By: Earle Gell M.D.   On: 02/27/2018 16:38   Dg Chest Port 1 View  Result Date: 02/26/2018 CLINICAL DATA:  84 year old female with intermittent productive cough EXAM: PORTABLE CHEST 1 VIEW COMPARISON:  02/28/2016 FINDINGS: Cardiomediastinal silhouette unchanged. Interval development of asymmetric elevation the right hemidiaphragm. No pneumothorax. Low lung volumes with coarsened interstitial markings and interlobular septal thickening. No large pleural effusion. No confluent airspace disease. Interval placement of right upper extremity PICC with the tip appearing to terminate superior vena cava. Degenerative changes of the left glenohumeral joint. IMPRESSION: Low lung volumes with questionable edema. New asymmetric elevation the right hemidiaphragm, uncertain significance. Right upper extremity PICC. Electronically Signed   By: Corrie Mckusick D.O.   On: 02/26/2018 19:28    Assessment/Plan:  Acute kidney injury.  Etiology appears to be hemodynamically mediated.  This could represent acute tubular necrosis.  Patient was on ARB losartan at home this was not given to her in the hospital.  She has  been hospitalized since 02/21/2018 there does not appear to be the use of nonsteroidal anti-inflammatory drugs.  Chest  and abdomen CT study was performed with 100 cc bolus on 02/20/2018.  She has been receiving Dilaudid for pain.  Urinalysis did show some microscopic hematuria.  This may be secondary to catheter.  We shall follow this.  There is no proteinuria or anything to suggest glomerular origin at this point.  This could represent acute interstitial nephritis from the administration of antibiotics.  Antibiotics were discontinued on 02/27/2018 and included Cipro and Flagyl.  Her antibiotic doses are renally adjusted.  There is no evidence of hydronephrosis on CT scan.  We will continue to follow as she is scheduled for surgery and would anticipate that her renal function may deteriorate will be need to be seen by the on-call [place] kidney nephrologist  Hypertension/volume.  This is medically difficult to assess in view of the fact the patient has COPD.  She does not appear volume overloaded and is non-oliguric at this time continuing to make urine.  We shall continue to follow her postoperative course  Anemia stable not an issue at this time  Acid-base stable with no electrolyte abnormalities that need to be corrected  Mild hyponatremia we will continue to follow probably related to her acute kidney injury and administration of fluids.  He is n.p.o. for surgery  Sigmoid adenoma appears to be scheduled for colectomy today.  COPD continues on Solu-Medrol and nebulizers  Pain control would avoid the use of morphine and long-acting agents fentanyl would be preferable in view of the renal failure    LOS: [place] @TODAY @10 :13 AM

## 2018-02-28 NOTE — Progress Notes (Signed)
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 CT of the abdomen pelvis shows diverticulitis with large amount of stool throughout the colon and concern for underlying colonic obstruction. Underwent sigmoidoscopy showing obstruction in the sigmoid colon with marked edema. GI and surgery following.  The patient subsequently developed a new fever.  Blood and urine cultures were ordered.  Repeat CT abd/pelvis ordered 2/13 showed free air.  Case was discussed with general surgery and plans to go to OR 2/14.  Assessment/Plan: Large bowel (sigmoid) obstruction Underwent sigmoidoscopy showing obstruction in the sigmoid colon with marked edema. Surgery following and recommend observation for now with antibiotics, no need for surgery. Having some bowel movement, passing gas.  -Advance diet to soft--pt is tolerating GI recommends outpatient colonoscopy in 6 weeks for full evaluation of the colon. Sigmoidoscopy biopsy--tubular adenoma -02/27/18 CT abd--New small amount of free intraperitoneal air, consistent with bowel perforation -case discussed with general surgery, Dr. Sharla Kidney go to OR today  Acute diverticulitis with perforation -cipro/flagyl d/ced -ertapenem started 2/13  Acute on chronic renal failure--CKD stage 3 -baseline creatinine 0.9-1.2 -consult nephrology -due to infectious process and hemodynamic changes -anticipate worsening -received lasix IV x 1 2/13  ??Hematemesis -RN reported multiple episodes of rust/red/brown emesis -consult GI -Hgb stable -start PPI bid  Bronchospasm/wheeze -start solumedrol -xopenex/atrovent\ -pulmicort -repeat CXR  Fever -blood cultures x 2--neg to date -due to perforated diverticulitis -UA--no pyruia -influenza  PCR-neg -CXR--personally reviewed; poor inspiration; increased interstitial markings -repeat CT abd/pelvis--as above -case discussed with Dr. Arnoldo Morale  Chronic bilateral lower extremity edema Suspect secondary to venous insufficiency. Uses TED hose at home.  -lasix on hold earlier in admission  Essential hypertension -Holding losartan and lasixPRN IV hydralazine.  Physical deconditioning PT eval-->SNF -Reports ambulating with a walker due to scoliosis.    Disposition Plan:not stable for d/c Family Communication:NoFamily at bedside  Consultants:General surgery, GI  Code Status: FULL   DVT Prophylaxis: [place] Lovenox   Procedures: As Listed in Progress Note Above  Antibiotics: cipro 2/7>>>2/13 Flagyl2/7>>>2/13    Subjective: Pt complains of some cough and sob.  Denies chest pain.  States abd pain about same as yesterday but more LLQ this am.  Had emesis numerous times overnight, some maroon this am.  No melena noted.  No dysuria  Objective: Vitals:   02/27/18 1956 02/27/18 2123 02/28/18 0352 02/28/18 0438  BP:  108/61  (!) 129/47  Pulse:  (!) 122  (!) 111  Resp:  (!) 24  20  Temp:  98.7 F (37.1 C)  98.5 F (36.9 C)  TempSrc:  Oral  Oral  SpO2: 92% 93% 96% 97%  Weight:      Height:        Intake/Output Summary (Last 24 hours) at 02/28/2018 4097 Last data filed at 02/27/2018 1235 Gross per 24 hour  Intake 240 ml  Output -  Net 240 ml   Weight change:  Exam:   General:  Pt is alert, follows commands appropriately, not in acute distress  HEENT: No icterus, No thrush, No neck mass, [place]/AT  Cardiovascular: RRR, S1/S2, no rubs, no gallops  Respiratory: bibasilar rales. Bilateral exp wheeze  Abdomen: Soft/+BS, LLQ and RLQ tender, non distended, no guarding  Extremities: 1+LE edema, No lymphangitis, No petechiae, No rashes, no  synovitis   Data Reviewed: I have personally reviewed following labs and imaging studies Basic Metabolic  Panel: Recent Labs  Lab 02/23/18 0547 02/24/18 0600 02/26/18 0506 02/27/18 0552 02/28/18 0601  NA 135 136 134* 135 133*  K 3.8 3.9 4.0 3.9 4.5  CL 102 103 101 102 96*  CO2 27 26 25 23 24   GLUCOSE 122* 118* 118* 158* 100*  BUN 16 12 14 17  31*  CREATININE 1.02* 0.92 0.90 1.21* 2.23*  CALCIUM 8.1* 8.2* 8.3* 8.5* 8.8*  MG  --   --   --   --  2.5*   Liver Function Tests: Recent Labs  Lab 02/22/18 0626 02/27/18 0552  AST 13* 17  ALT 9 11  ALKPHOS 52 55  BILITOT 0.8 0.8  PROT 5.0* 5.1*  ALBUMIN 2.6* 2.3*   No results for input(s): LIPASE, AMYLASE in the last 168 hours. No results for input(s): AMMONIA in the last 168 hours. Coagulation Profile: No results for input(s): INR, PROTIME in the last 168 hours. CBC: Recent Labs  Lab 02/23/18 0547 02/24/18 0600 02/26/18 0506 02/27/18 0552 02/28/18 0601  WBC 13.3* 13.5* 16.1* 10.1 26.2*  HGB 10.4* 10.4* 11.2* 13.1 13.1  HCT 32.8* 32.1* 34.3* 41.1 40.1  MCV 97.3 96.7 94.2 93.8 93.9  PLT 154 177 221 332 328   Cardiac Enzymes: No results for input(s): CKTOTAL, CKMB, CKMBINDEX, TROPONINI in the last 168 hours. BNP: Invalid input(s): POCBNP CBG: No results for input(s): GLUCAP in the last 168 hours. HbA1C: No results for input(s): HGBA1C in the last 72 hours. Urine analysis:    Component Value Date/Time   COLORURINE YELLOW 02/26/2018 [place] 02/26/2018 1712   LABSPEC 1.014 02/26/2018 1712   PHURINE 8.0 02/26/2018 1712   GLUCOSEU NEGATIVE 02/26/2018 1712   HGBUR SMALL (A) 02/26/2018 1712   BILIRUBINUR NEGATIVE 02/26/2018 1712   KETONESUR NEGATIVE 02/26/2018 1712   PROTEINUR NEGATIVE 02/26/2018 1712   UROBILINOGEN 0.2 04/21/2014 1200   NITRITE NEGATIVE 02/26/2018 1712   LEUKOCYTESUR NEGATIVE 02/26/2018 1712   Sepsis Labs: @LABRCNTIP (procalcitonin:4,lacticidven:4) ) Recent Results (from the past 240 hour(s))  Culture, blood (Routine X 2) w Reflex to ID Panel     Status: None (Preliminary result)     Collection Time: 02/26/18  9:25 AM  Result Value Ref Range Status   Specimen Description LEFT ANTECUBITAL  Final   Special Requests   Final    BOTTLES DRAWN AEROBIC AND ANAEROBIC Blood Culture adequate volume   Culture   Final    NO GROWTH < 24 HOURS Performed at [facility], [address] [place], [place] [address]    Report Status PENDING  Incomplete  Culture, blood (Routine X 2) w Reflex to ID Panel     Status: None (Preliminary result)   Collection Time: 02/26/18  9:33 AM  Result Value Ref Range Status   Specimen Description BLOOD LEFT HAND  Final   Special Requests   Final    BOTTLES DRAWN AEROBIC AND ANAEROBIC Blood Culture adequate volume   Culture   Final    NO GROWTH < 24 HOURS Performed at [facility], [address], [place], [place] [address]    Report Status PENDING  Incomplete  Respiratory Panel by PCR     Status: None   Collection Time: 02/27/18  8:07 AM  Result Value Ref Range Status   Adenovirus NOT DETECTED NOT DETECTED Final   Coronavirus 229E NOT DETECTED NOT DETECTED Final    Comment: (NOTE) The  Coronavirus on the Respiratory Panel, DOES NOT test for the novel  Coronavirus (2019 nCoV)    Coronavirus HKU1 NOT DETECTED NOT DETECTED Final   Coronavirus NL63 NOT DETECTED NOT DETECTED Final   Coronavirus OC43 NOT DETECTED NOT DETECTED Final   Metapneumovirus NOT DETECTED NOT DETECTED Final   Rhinovirus / Enterovirus NOT DETECTED NOT DETECTED Final   Influenza A NOT DETECTED NOT DETECTED Final   Influenza B NOT DETECTED NOT DETECTED Final   Parainfluenza Virus 1 NOT DETECTED NOT DETECTED Final   Parainfluenza Virus 2 NOT DETECTED NOT DETECTED Final   Parainfluenza Virus 3 NOT DETECTED NOT DETECTED Final   Parainfluenza Virus 4 NOT DETECTED NOT DETECTED Final   Respiratory Syncytial Virus NOT DETECTED NOT DETECTED Final   Bordetella pertussis NOT DETECTED NOT DETECTED Final   Chlamydophila pneumoniae NOT DETECTED NOT DETECTED Final   Mycoplasma  pneumoniae NOT DETECTED NOT DETECTED Final    Comment: Performed at [facility], [place] [address] [place], [place] [address]     Scheduled Meds: . budesonide (PULMICORT) nebulizer solution  0.5 mg Nebulization BID  . Chlorhexidine Gluconate Cloth  6 each Topical Once   And  . Chlorhexidine Gluconate Cloth  6 each Topical Once  . ipratropium  0.5 mg Nebulization Q6H  . levalbuterol  1.25 mg Nebulization Q6H  . magnesium hydroxide  30 mL Oral BID  . methylPREDNISolone (SOLU-MEDROL) injection  60 mg Intravenous Q8H  . mupirocin ointment  1 application Nasal BID  . polyethylene glycol  17 g Oral BID   Continuous Infusions: . sodium chloride    . ertapenem 1,000 mg (02/27/18 1826)    Procedures/Studies: Ct Abdomen Pelvis Wo Contrast  Result Date: 02/27/2018 CLINICAL DATA:  Follow-up diverticulitis. Chronic abdominal pain. EXAM: CT ABDOMEN AND PELVIS WITHOUT CONTRAST TECHNIQUE: Multidetector CT imaging of the abdomen and pelvis was performed following the standard protocol without IV contrast. COMPARISON:  02/20/2018 FINDINGS: Lower chest: New small bilateral pleural effusions and bibasilar atelectasis. Hepatobiliary: Stable small cyst in left hepatic lobe. No mass visualized on this unenhanced exam. Tiny gallstones again noted, however there is no evidence of cholecystitis or biliary ductal dilatation. Pancreas: No mass or inflammatory process visualized on this unenhanced exam. Spleen:  Within normal limits in size. Adrenals/Urinary tract: No evidence of urolithiasis or hydronephrosis. Unremarkable unopacified urinary bladder. Stomach/Bowel: New small amount of free intraperitoneal air is seen, consistent with bowel perforation. Mild ascites is new since previous study, as well as diffuse mesenteric and body wall edema. Large amount of stool is again seen throughout the colon. A transition point is again seen near the rectosigmoid junction, consistent with stricture. No definite soft tissue  mass appreciated by CT. Mild diverticulosis and wall thickening is seen involving the proximal sigmoid, which is similar to previous study and suspicious for mild diverticulitis. No evidence of small bowel wall thickening or obstruction. No abscess identified. Vascular/Lymphatic: No pathologically enlarged lymph nodes identified. No evidence of abdominal aortic aneurysm. Reproductive: Prior hysterectomy. Limited visualization due to artifact from left hip prosthesis. Other:  None. Musculoskeletal:  No suspicious bone lesions identified. IMPRESSION: 1. New small amount of free intraperitoneal air, consistent with bowel perforation. 2. Large colonic stool burden with transition point near the rectosigmoid junction, consistent with stricture. No definite soft tissue mass appreciated by CT. This is unchanged in appearance compared to previous study. Recommend correlation with colonoscopy to exclude obstructing colon carcinoma. 3. Mild diverticulitis of proximal sigmoid colon, without significant change. 4. New mild  ascites and diffuse body wall edema. New small bilateral pleural effusions and bibasilar atelectasis. 5. Cholelithiasis. No radiographic evidence of cholecystitis. Critical Value/emergent results were called by telephone at the time of interpretation on 02/27/2018 at 4:35 pm to Dr. Shanon Brow Kiren Mcisaac , who verbally acknowledged these results. Electronically Signed   By: Earle Gell M.D.   On: 02/27/2018 16:38   Ct Abdomen Pelvis W Contrast  Result Date: 02/20/2018 CLINICAL DATA:  Lower abdominal pain and constipation for the past 2 days. Clinical suspicion for diverticulitis. EXAM: CT ABDOMEN AND PELVIS WITH CONTRAST TECHNIQUE: Multidetector CT imaging of the abdomen and pelvis was performed using the standard protocol following bolus administration of intravenous contrast. CONTRAST:  172mL OMNIPAQUE IOHEXOL 300 MG/ML  SOLN COMPARISON:  Abdomen radiographs dated 02/06/2018. Abdomen and pelvis CT dated 04/08/2015.  FINDINGS: Lower chest: Unremarkable. Hepatobiliary: No significant change in previously demonstrated liver cysts. Small number of tiny gallstones in the gallbladder measuring up to 4 mm in maximum diameter each. No gallbladder wall thickening or pericholecystic fluid. Pancreas: Unremarkable. No pancreatic ductal dilatation or surrounding inflammatory changes. Spleen: 5 mm oval area of low density in the spleen on image number 29 series 2, difficult to detect with certainty on the previous examination, partly due to differences in technique and timing. Adrenals/Urinary Tract: Normal appearing adrenal glands. Stable tiny exophytic upper pole left renal cyst. Normal appearing right kidney, ureters and urinary bladder. Stomach/Bowel: Large amount of stool throughout the colon to the level of the distal sigmoid colon where there is a short segment of concentric, medium density wall thickening, best seen on image number 72 series 2. The wall thickening is concentric and lower in density on coronal image number 90. Lesser amount of stool and gas in the normal caliber rectum. Minimal sigmoid colon diverticulosis. Mild pericolonic soft tissue stranding involving the distal sigmoid colon, most pronounced at the level of maximal distension of the colon by the stool. Normal appearing stomach, small bowel and appendix. Vascular/Lymphatic: Atheromatous arterial calcifications without aneurysm. No enlarged lymph nodes. Inferior vena cava filter with its tip just above the level of the renal veins. Reproductive: Status post hysterectomy. No adnexal masses. Other: Small amount of free peritoneal fluid in the pelvis. Musculoskeletal: Left hip prosthesis. Severe right hip degenerative changes with severe joint space narrowing, extensive subarticular cyst formation, spur formation and bony remodeling. Moderate levoconvex thoracolumbar scoliosis. Interbody and pedicle screw and rod fusion at the L4-5 level. Fusion of portions of the T12  and L1 vertebral bodies. Lumbar and lower thoracic spine degenerative changes. IMPRESSION: 1. Large amount of stool throughout the colon to the level of the distal sigmoid colon where there is a short segment of concentric, medium density wall thickening. This is concerning for an obstructing short segment mass. It would be unusual for edema due to diverticulitis to involve that short of a segment of bowel. Correlation with sigmoidoscopy or colonoscopy is recommended. 2. Mild pericolonic soft tissue stranding involving the distal sigmoid colon, most pronounced at the level of maximal distension of the colon by the stool. This is suspicious for mild diverticulitis without abscess. There is minimal diverticulosis in that region of the colon. 3. Cholelithiasis. 4. 5 mm probable hemangioma in the spleen. Electronically Signed   By: Claudie Revering M.D.   On: 02/20/2018 20:59   Dg Chest Port 1 View  Result Date: 02/26/2018 CLINICAL DATA:  84 year old female with intermittent productive cough EXAM: PORTABLE CHEST 1 VIEW COMPARISON:  02/28/2016 FINDINGS: Cardiomediastinal silhouette unchanged. Interval  development of asymmetric elevation the right hemidiaphragm. No pneumothorax. Low lung volumes with coarsened interstitial markings and interlobular septal thickening. No large pleural effusion. No confluent airspace disease. Interval placement of right upper extremity PICC with the tip appearing to terminate superior vena cava. Degenerative changes of the left glenohumeral joint. IMPRESSION: Low lung volumes with questionable edema. New asymmetric elevation the right hemidiaphragm, uncertain significance. Right upper extremity PICC. Electronically Signed   By: Corrie Mckusick D.O.   On: 02/26/2018 19:28   Dg Abd 2 Views  Result Date: 02/06/2018 CLINICAL DATA:  IVC filter placement evaluation. EXAM: ABDOMEN - 2 VIEW COMPARISON:  03/29/2017. FINDINGS: Lumbar spine numbered as per prior exam. IVC filter noted with tip at  approximately the L1 level. Degenerative changes and scoliosis thoracic spine. L4-L5 fusion. Rounded calcific densities noted over the right upper quadrant, these could represent gallstones, kidney stones, undigested pill fragments. No bowel distention or free air. Stool noted throughout the colon and rectum. IMPRESSION: IVC filter noted with proximal tip at the L1 level. Electronically Signed   By: Marcello Moores  Register   On: 02/06/2018 14:21   [place] Ekg Site Rite  Result Date: 02/22/2018 If Site Rite image not attached, placement could not be confirmed due to current cardiac rhythm.   Orson Eva, DO  [facility] Pager [phone]  If 7PM-7AM, please contact night-coverage [email or web address] Password [email or web address] 02/28/2018, 8:14 AM   LOS: 8 days

## 2018-02-28 NOTE — Progress Notes (Signed)
7 Days Post-Op  Subjective: Patient has developed more abdominal pain.  Objective: Vital signs in last 24 hours: Temp:  [98.5 F (36.9 C)-98.9 F (37.2 C)] 98.5 F (36.9 C) (02/14 0438) Pulse Rate:  [111-122] 111 (02/14 0438) Resp:  [19-24] 20 (02/14 0438) BP: (108-129)/(47-69) 129/47 (02/14 0438) SpO2:  [92 %-97 %] 97 % (02/14 0438) Last BM Date: 02/27/18  Intake/Output from previous day: 02/13 0701 - 02/14 0700 In: 240 [P.O.:240] Out: -  Intake/Output this shift: No intake/output data recorded.  General appearance: alert, cooperative and fatigued GI: Tender throughout abdomen.  No bowel sounds appreciated.  Distended.  Lab Results:  Recent Labs    02/27/18 0552 02/28/18 0601  WBC 10.1 26.2*  HGB 13.1 13.1  HCT 41.1 40.1  PLT 332 328   BMET Recent Labs    02/27/18 0552 02/28/18 0601  NA 135 133*  K 3.9 4.5  CL 102 96*  CO2 23 24  GLUCOSE 158* 100*  BUN 17 31*  CREATININE 1.21* 2.23*  CALCIUM 8.5* 8.8*   PT/INR No results for input(s): LABPROT, INR in the last 72 hours.  Studies/Results: Ct Abdomen Pelvis Wo Contrast  Result Date: 02/27/2018 CLINICAL DATA:  Follow-up diverticulitis. Chronic abdominal pain. EXAM: CT ABDOMEN AND PELVIS WITHOUT CONTRAST TECHNIQUE: Multidetector CT imaging of the abdomen and pelvis was performed following the standard protocol without IV contrast. COMPARISON:  02/20/2018 FINDINGS: Lower chest: New small bilateral pleural effusions and bibasilar atelectasis. Hepatobiliary: Stable small cyst in left hepatic lobe. No mass visualized on this unenhanced exam. Tiny gallstones again noted, however there is no evidence of cholecystitis or biliary ductal dilatation. Pancreas: No mass or inflammatory process visualized on this unenhanced exam. Spleen:  Within normal limits in size. Adrenals/Urinary tract: No evidence of urolithiasis or hydronephrosis. Unremarkable unopacified urinary bladder. Stomach/Bowel: New small amount of free  intraperitoneal air is seen, consistent with bowel perforation. Mild ascites is new since previous study, as well as diffuse mesenteric and body wall edema. Large amount of stool is again seen throughout the colon. A transition point is again seen near the rectosigmoid junction, consistent with stricture. No definite soft tissue mass appreciated by CT. Mild diverticulosis and wall thickening is seen involving the proximal sigmoid, which is similar to previous study and suspicious for mild diverticulitis. No evidence of small bowel wall thickening or obstruction. No abscess identified. Vascular/Lymphatic: No pathologically enlarged lymph nodes identified. No evidence of abdominal aortic aneurysm. Reproductive: Prior hysterectomy. Limited visualization due to artifact from left hip prosthesis. Other:  None. Musculoskeletal:  No suspicious bone lesions identified. IMPRESSION: 1. New small amount of free intraperitoneal air, consistent with bowel perforation. 2. Large colonic stool burden with transition point near the rectosigmoid junction, consistent with stricture. No definite soft tissue mass appreciated by CT. This is unchanged in appearance compared to previous study. Recommend correlation with colonoscopy to exclude obstructing colon carcinoma. 3. Mild diverticulitis of proximal sigmoid colon, without significant change. 4. New mild ascites and diffuse body wall edema. New small bilateral pleural effusions and bibasilar atelectasis. 5. Cholelithiasis. No radiographic evidence of cholecystitis. Critical Value/emergent results were called by telephone at the time of interpretation on 02/27/2018 at 4:35 pm to Dr. Shanon Brow TAT , who verbally acknowledged these results. Electronically Signed   By: Earle Gell M.D.   On: 02/27/2018 16:38   Dg Chest Port 1 View  Result Date: 02/26/2018 CLINICAL DATA:  84 year old female with intermittent productive cough EXAM: PORTABLE CHEST 1 VIEW COMPARISON:  02/28/2016 FINDINGS:  Cardiomediastinal silhouette unchanged. Interval development of asymmetric elevation the right hemidiaphragm. No pneumothorax. Low lung volumes with coarsened interstitial markings and interlobular septal thickening. No large pleural effusion. No confluent airspace disease. Interval placement of right upper extremity PICC with the tip appearing to terminate superior vena cava. Degenerative changes of the left glenohumeral joint. IMPRESSION: Low lung volumes with questionable edema. New asymmetric elevation the right hemidiaphragm, uncertain significance. Right upper extremity PICC. Electronically Signed   By: Corrie Mckusick D.O.   On: 02/26/2018 19:28    Anti-infectives: Anti-infectives (From admission, onward)   Start     Dose/Rate Route Frequency Ordered Stop   02/27/18 1800  ertapenem (INVANZ) 1,000 mg in sodium chloride 0.9 % 100 mL IVPB     1 g 200 mL/hr over 30 Minutes Intravenous Every 24 hours 02/27/18 1705     02/21/18 0800  ciprofloxacin (CIPRO) IVPB 400 mg  Status:  Discontinued     400 mg 200 mL/hr over 60 Minutes Intravenous Every 12 hours 02/20/18 2204 02/27/18 1908   02/21/18 0500  metroNIDAZOLE (FLAGYL) IVPB 500 mg  Status:  Discontinued     500 mg 100 mL/hr over 60 Minutes Intravenous Every 8 hours 02/20/18 2204 02/27/18 1908   02/20/18 2115  ciprofloxacin (CIPRO) IVPB 400 mg     400 mg 200 mL/hr over 60 Minutes Intravenous  Once 02/20/18 2113 02/20/18 2335   02/20/18 2115  metroNIDAZOLE (FLAGYL) tablet 500 mg     500 mg Oral  Once 02/20/18 2113 02/20/18 2147      Assessment/Plan: Impression: Perforated bowel with worsening renal failure and leukocytosis. Plan: I had extensive discussion with the patient concerning the need to go to the operating room for a partial colectomy with colostomy.  I did tell her that I could possibly take down the colostomy in the future.  She was told that if she did not have surgery, she could die.  She is agreed to a partial colectomy with  colostomy.  The risks and benefits of the procedure including bleeding, infection, cardiopulmonary difficulties, and death were fully explained to the patient, who gave informed consent.  She is a high risk surgical candidate.  She may remain intubated after the procedure.  She will be transferred to the ICU after surgery.  LOS: 8 days    [name] 02/28/2018

## 2018-02-28 NOTE — Transfer of Care (Signed)
Immediate Anesthesia Transfer of Care Note  Patient: Regina Hall  Procedure(s) Performed: PARTIAL COLECTOMY WITH COLOSTOMY (N/A )  Patient Location: PACU and ICU  Anesthesia Type:General  Level of Consciousness: sedated  Airway & Oxygen Therapy: Patient remains intubated per anesthesia plan and Patient placed on Ventilator (see vital sign flow sheet for setting)  Post-op Assessment: Report given to RN  Post vital signs: Reviewed and stable  Last Vitals:  Vitals Value Taken Time  BP    Temp    Pulse    Resp    SpO2      Last Pain:  Vitals:   02/28/18 1308  TempSrc: Oral  PainSc: 0-No pain      Patients Stated Pain Goal: 2 (63/81/77 1165)  Complications: No apparent anesthesia complications

## 2018-02-28 NOTE — Progress Notes (Signed)
Received call from floor secretary to reconsult patient due to "hematemesis." Patient apparently had some N/V this morning and noted "rust/red/brown emesis". Patient has had bowel obstruction and now with bowel perforation. She has plans to transfer to the OR immenently for partial colectomy with colostomy. Her hgb is stable and she has been placed on PPI.  Discussed with Dr. Arnoldo Morale in passing. Plans to place OG in the OR and if obvious blood is noted he is credentialed for endoscopy and can further evaluate. Per Dr. Arnoldo Morale, no need for [place] to urgently see patient given within-the-hour planned operative procedure.  Obviously if UGI bleed continues post-operative we can assist.  Thank you for allowing [place] to participate in the care of Regina A Enedina Finner, DNP, AGNP-C Adult & Gerontological Nurse Practitioner [facility]

## 2018-02-28 NOTE — Progress Notes (Signed)
Present with Regina Hall for support--life and faith discussion, coping with illness-where she finds hope.

## 2018-02-28 NOTE — Anesthesia Preprocedure Evaluation (Signed)
Anesthesia Evaluation  Patient identified by MRN, date of birth, ID band Patient awake    Reviewed: Allergy & Precautions, NPO status , Patient's Chart, lab work & pertinent test results  Airway Mallampati: II  TM Distance: >3 FB Neck ROM: Full    Dental no notable dental hx. (+) Teeth Intact, Poor Dentition Upper right loose :   Pulmonary pneumonia,    Pulmonary exam normal breath sounds clear to auscultation + decreased breath sounds      Cardiovascular Exercise Tolerance: Poor hypertension, Pt. on medications negative cardio ROS Normal cardiovascular examII Rhythm:Regular Rate:Normal     Neuro/Psych Anxiety negative neurological ROS  negative psych ROS   GI/Hepatic Neg liver ROS, GERD  Medicated and Poorly Controlled,Here in Hospital > 30 days  Appears weak -now with perforation -to OR D/W Surgeon probable post op ventilation   Endo/Other  negative endocrine ROS  Renal/GU Renal InsufficiencyRenal disease  negative genitourinary   Musculoskeletal  (+) Arthritis , Osteoarthritis,    Abdominal   Peds negative pediatric ROS (+)  Hematology negative hematology ROS (+) anemia ,   Anesthesia Other Findings   Reproductive/Obstetrics negative OB ROS                             Anesthesia Physical Anesthesia Plan  ASA: V  Anesthesia Plan: General   Post-op Pain Management:    Induction: Intravenous  PONV Risk Score and Plan:   Airway Management Planned: Oral ETT  Additional Equipment:   Intra-op Plan:   Post-operative Plan: Extubation in OR  Informed Consent: I have reviewed the patients History and Physical, chart, labs and discussed the procedure including the risks, benefits and alternatives for the proposed anesthesia with the patient or authorized representative who has indicated his/her understanding and acceptance.     Dental advisory given  Plan Discussed with:  CRNA  Anesthesia Plan Comments: (D/W pt and Surgeon probable post op ventilation planned )        Anesthesia Quick Evaluation

## 2018-03-01 ENCOUNTER — Inpatient Hospital Stay (HOSPITAL_COMMUNITY): Payer: Medicare Other

## 2018-03-01 DIAGNOSIS — J9801 Acute bronchospasm: Secondary | ICD-10-CM

## 2018-03-01 DIAGNOSIS — K658 Other peritonitis: Secondary | ICD-10-CM

## 2018-03-01 LAB — BLOOD GAS, ARTERIAL
Acid-base deficit: 2.6 mmol/L — ABNORMAL HIGH (ref 0.0–2.0)
Bicarbonate: 22.4 mmol/L (ref 20.0–28.0)
FIO2: 35
O2 Saturation: 95.4 %
Patient temperature: 37
pCO2 arterial: 35.2 mmHg (ref 32.0–48.0)
pH, Arterial: 7.401 (ref 7.350–7.450)
pO2, Arterial: 85.6 mmHg (ref 83.0–108.0)

## 2018-03-01 LAB — BASIC METABOLIC PANEL
Anion gap: 8 (ref 5–15)
BUN: 40 mg/dL — ABNORMAL HIGH (ref 8–23)
CO2: 21 mmol/L — ABNORMAL LOW (ref 22–32)
Calcium: 7.7 mg/dL — ABNORMAL LOW (ref 8.9–10.3)
Chloride: 105 mmol/L (ref 98–111)
Creatinine, Ser: 1.95 mg/dL — ABNORMAL HIGH (ref 0.44–1.00)
GFR calc Af Amer: 27 mL/min — ABNORMAL LOW (ref >60)
GFR, EST NON AFRICAN AMERICAN: 24 mL/min — AB (ref >60)
GLUCOSE: 162 mg/dL — AB (ref 70–99)
Potassium: 4.3 mmol/L (ref 3.5–5.1)
Sodium: 134 mmol/L — ABNORMAL LOW (ref 135–145)

## 2018-03-01 LAB — CBC
HCT: 31.7 % — ABNORMAL LOW (ref 36.0–46.0)
Hemoglobin: 10.4 g/dL — ABNORMAL LOW (ref 12.0–15.0)
MCH: 31 pg (ref 26.0–34.0)
MCHC: 32.8 g/dL (ref 30.0–36.0)
MCV: 94.3 fL (ref 80.0–100.0)
Platelets: 367 10*3/uL (ref 150–400)
RBC: 3.36 MIL/uL — ABNORMAL LOW (ref 3.87–5.11)
RDW: 13.5 % (ref 11.5–15.5)
WBC: 25 10*3/uL — ABNORMAL HIGH (ref 4.0–10.5)
nRBC: 0.1 % (ref 0.0–0.2)

## 2018-03-01 LAB — GLUCOSE, CAPILLARY
GLUCOSE-CAPILLARY: 157 mg/dL — AB (ref 70–99)
Glucose-Capillary: 146 mg/dL — ABNORMAL HIGH (ref 70–99)
Glucose-Capillary: 154 mg/dL — ABNORMAL HIGH (ref 70–99)
Glucose-Capillary: 146 mg/dL — ABNORMAL HIGH (ref 70–99)

## 2018-03-01 LAB — PHOSPHORUS: Phosphorus: 4 mg/dL (ref 2.5–4.6)

## 2018-03-01 LAB — MAGNESIUM: Magnesium: 2.4 mg/dL (ref 1.7–2.4)

## 2018-03-01 LAB — SODIUM, URINE, RANDOM: Sodium, Ur: <10 mmol/L

## 2018-03-01 LAB — CREATININE, URINE, RANDOM: Creatinine, Urine: 150.91 mg/dL

## 2018-03-01 MED ORDER — SODIUM CHLORIDE 0.9% FLUSH: 10.0000 mL | INTRAVENOUS | Status: DC | PRN

## 2018-03-01 MED ORDER — ALBUMIN HUMAN 25 % IV SOLN
25.0000 g | Freq: Once | INTRAVENOUS | Status: AC
  Administered 2018-03-01: 25 g via INTRAVENOUS
  Filled 2018-03-01: qty 100

## 2018-03-01 MED ORDER — SODIUM CHLORIDE 0.9% FLUSH
10.0000 mL | Freq: Two times a day (BID) | INTRAVENOUS | Status: DC
  Administered 2018-03-01 – 2018-03-13 (×23): 10 mL

## 2018-03-01 MED ORDER — CHLORHEXIDINE GLUCONATE CLOTH 2 % EX PADS
6.0000 | MEDICATED_PAD | Freq: Every day | CUTANEOUS | Status: DC
  Administered 2018-03-01 – 2018-03-13 (×13): 6 via TOPICAL

## 2018-03-01 MED ORDER — SODIUM CHLORIDE 0.9 % IV BOLUS
500.0000 mL | Freq: Once | INTRAVENOUS | Status: AC
  Administered 2018-03-01: 500 mL via INTRAVENOUS

## 2018-03-01 MED ORDER — ORAL CARE MOUTH RINSE
15.0000 mL | OROMUCOSAL | Status: DC
  Administered 2018-03-01 – 2018-03-04 (×34): 15 mL via OROMUCOSAL

## 2018-03-01 MED ORDER — SODIUM CHLORIDE 0.9 % IV BOLUS
1000.0000 mL | Freq: Once | INTRAVENOUS | Status: AC
  Administered 2018-03-01: 1000 mL via INTRAVENOUS

## 2018-03-01 MED ORDER — SODIUM CHLORIDE 0.9 % IV SOLN
0.0000 ug/min | INTRAVENOUS | Status: DC
  Administered 2018-03-01: 50 ug/min via INTRAVENOUS
  Administered 2018-03-02: 10 ug/min via INTRAVENOUS
  Filled 2018-03-01 (×2): qty 500

## 2018-03-01 MED ORDER — CHLORHEXIDINE GLUCONATE 0.12% ORAL RINSE (MEDLINE KIT)
15.0000 mL | Freq: Two times a day (BID) | OROMUCOSAL | Status: DC
  Administered 2018-03-01 – 2018-03-04 (×7): 15 mL via OROMUCOSAL

## 2018-03-01 NOTE — Progress Notes (Signed)
PROGRESS NOTE  Regina Hall NXG:[id number] DOB: Nov 06, 1934 DOA: 02/20/2018 PCP: [name], DO   Brief History: 84 year old female with hypertension, history of left leg DVT status post IVC filter presented to the hospital with abdominal pain. CT of the abdomen pelvis shows diverticulitis with large amount of stool throughout the colon and concern for underlying colonic obstruction. Underwent sigmoidoscopy showing obstruction in the sigmoid colon with marked edema. GI and surgery following. The patient subsequently developed a new fever. Blood and urine cultures were ordered. Repeat CT abd/pelvis ordered 2/13 showed free air.  Case was discussed with general surgery and plans to go to OR 2/14. Pt underwent a Hartman's procedure on 02/28/18.  She was transferred to ICU on ventilator post-op.   Post-op, she was hypotensive and started on neosynephrine.  She also developed new onset atrial fibrillation.  She was started on amiodarone IV.  Assessment/Plan: Large bowel (sigmoid) obstruction Underwent sigmoidoscopy showing obstruction in the sigmoid colon with marked edema. Surgery following and recommend observation for now with antibiotics, no need for surgery. Having some bowel movement, passing gas.  -Advance diet to soft--pt is tolerating GI recommends outpatient colonoscopy in 6 weeks for full evaluation of the colon. Sigmoidoscopy biopsy--tubular adenoma -02/27/18 CT abd--New small amount of free intraperitoneal air, consistent with bowel perforation -case discussed with general surgery, Dr.Bridges -2/14--Hartmann procedure  Acute diverticulitis with perforation/peritonitis -cipro/flagyl d/ced -ertapenem started 2/13 -02/27/18 CT abd--New small amount of free intraperitoneal air, consistent with bowel perforation -case discussed with general surgery, Dr.Bridges -2/14--Hartmann procedure  New Onset Atrial Fibrillation -started amiodarone drip due to  hypotension -converted back to sinus after 4-5 hours on amio -continue amiodarone drip for now as pt continues to hypotensive on neo due to high risk to reverting back to afib  Acute on chronic renal failure--CKD stage 3 -baseline creatinine 0.9-1.2 -consult nephrology--appreciated -due to infectious process and hemodynamic changes -anticipate worsening -received lasix IV x  2/13  Acute respiratory failure with hypoxia -due to hypoventilation, fluid, bronchospasm -appreciate pulmonary -wean to extubation  ??Hematemesis -RN reported multiple episodes of rust/red/brown emesis -consult GI--noted input -continue PPI bid -no blood through OG  Bronchospasm/wheeze -continue solumedrol -xopenex/atrovent\ -pulmicort -repeat CXR--personally reviewed--mild improvement in interstitial markings, poor inspiration  Chronic bilateral lower extremity edema Suspect secondary to venous insufficiency. Uses TED hose at home.  -lasix on hold earlier in admission  Essential hypertension -Holding losartan due to hypotension  Physical deconditioning PT eval-->SNF -Reports ambulating with a walker due to scoliosis.    Disposition Plan:remain in ICU Family Communication:NoFamily at bedside  Consultants:General surgery, GI  Code Status: FULL   DVT Prophylaxis: [place] Lovenox   Procedures: As Listed in Progress Note Above  Antibiotics: cipro2/7>>>2/13 Flagyl2/7>>>2/13     Subjective: Pt sedated on ventilator.  She wakes up to voice.  C/o abd pain.  Denies cp, sob, headache.  No reports of vomiting.  Liquid stool out of ostomy  Objective: Vitals:   03/01/18 0915 03/01/18 0930 03/01/18 0945 03/01/18 1000  BP:    (!) 93/40  Pulse: 77 76 76 76  Resp: (!) 24 (!) 21 (!) 21 (!) 22  Temp:      TempSrc:      SpO2: 96% 96% 96% 96%  Weight:      Height:        Intake/Output Summary (Last 24 hours) at 03/01/2018 1043 Last data filed at 03/01/2018 1000 Gross  per 24 hour  Intake 7904.94  ml  Output 535 ml  Net 7369.94 ml   Weight change:  Exam:   General:  Pt is alert, follows commands appropriately, not in acute distress  HEENT: No icterus, No thrush, No neck mass, [place]/AT  Cardiovascular: RRR, S1/S2, no rubs, no gallops  Respiratory: bibasilar crackles, no wheeze  Abdomen: Soft/+BS, non tender, non distended, no guarding  Extremities: 1 +LE edema, No lymphangitis, No petechiae, No rashes, no synovitis   Data Reviewed: I have personally reviewed following labs and imaging studies Basic Metabolic Panel: Recent Labs  Lab 02/24/18 0600 02/26/18 0506 02/27/18 0552 02/28/18 0601 03/01/18 0419  NA 136 134* 135 133* 134*  K 3.9 4.0 3.9 4.5 4.3  CL 103 101 102 96* 105  CO2 '26 25 23 24 ' 21*  GLUCOSE 118* 118* 158* 100* 162*  BUN '12 14 17 ' 31* 40*  CREATININE 0.92 0.90 1.21* 2.23* 1.95*  CALCIUM 8.2* 8.3* 8.5* 8.8* 7.7*  MG  --   --   --  2.5* 2.4  PHOS  --   --   --   --  4.0   Liver Function Tests: Recent Labs  Lab 02/27/18 0552  AST 17  ALT 11  ALKPHOS 55  BILITOT 0.8  PROT 5.1*  ALBUMIN 2.3*   No results for input(s): LIPASE, AMYLASE in the last 168 hours. No results for input(s): AMMONIA in the last 168 hours. Coagulation Profile: No results for input(s): INR, PROTIME in the last 168 hours. CBC: Recent Labs  Lab 02/24/18 0600 02/26/18 0506 02/27/18 0552 02/28/18 0601 03/01/18 0419  WBC 13.5* 16.1* 10.1 26.2* 25.0*  HGB 10.4* 11.2* 13.1 13.1 10.4*  HCT 32.1* 34.3* 41.1 40.1 31.7*  MCV 96.7 94.2 93.8 93.9 94.3  PLT 177 221 332 328 367   Cardiac Enzymes: No results for input(s): CKTOTAL, CKMB, CKMBINDEX, TROPONINI in the last 168 hours. BNP: Invalid input(s): POCBNP CBG: Recent Labs  Lab 02/28/18 1659 02/28/18 2057 03/01/18 0812  GLUCAP 144* 145* 157*   HbA1C: No results for input(s): HGBA1C in the last 72 hours. Urine analysis:    Component Value Date/Time   COLORURINE YELLOW 02/26/2018 1712    APPEARANCEUR CLEAR 02/26/2018 1712   LABSPEC 1.014 02/26/2018 1712   PHURINE 8.0 02/26/2018 1712   GLUCOSEU NEGATIVE 02/26/2018 1712   HGBUR SMALL (A) 02/26/2018 1712   BILIRUBINUR NEGATIVE 02/26/2018 1712   KETONESUR NEGATIVE 02/26/2018 1712   PROTEINUR NEGATIVE 02/26/2018 1712   UROBILINOGEN 0.2 04/21/2014 1200   NITRITE NEGATIVE 02/26/2018 1712   LEUKOCYTESUR NEGATIVE 02/26/2018 1712   Sepsis Labs: '@LABRCNTIP' (procalcitonin:4,lacticidven:4) ) Recent Results (from the past 240 hour(s))  Culture, blood (Routine X 2) w Reflex to ID Panel     Status: None (Preliminary result)   Collection Time: 02/26/18  9:25 AM  Result Value Ref Range Status   Specimen Description LEFT ANTECUBITAL  Final   Special Requests   Final    BOTTLES DRAWN AEROBIC AND ANAEROBIC Blood Culture adequate volume   Culture   Final    NO GROWTH 3 DAYS Performed at [facility], [address] [place], [place] [address]    Report Status PENDING  Incomplete  Culture, blood (Routine X 2) w Reflex to ID Panel     Status: None (Preliminary result)   Collection Time: 02/26/18  9:33 AM  Result Value Ref Range Status   Specimen Description BLOOD LEFT HAND  Final   Special Requests   Final    BOTTLES DRAWN AEROBIC AND ANAEROBIC Blood Culture  adequate volume   Culture   Final    NO GROWTH 3 DAYS Performed at [facility], [address], [place], [place] [address]    Report Status PENDING  Incomplete  Culture, Urine     Status: None   Collection Time: 02/26/18  5:12 PM  Result Value Ref Range Status   Specimen Description   Final    URINE, RANDOM Performed at [facility], [address], [place], [place] [address]    Special Requests   Final    NONE Performed at [facility], [address] [place], [place] [address]    Culture   Final    NO GROWTH Performed at [facility], [place] [address], [place], [place] [address]    Report Status 02/28/2018 FINAL  Final  Respiratory Panel by PCR     Status: None     Collection Time: 02/27/18  8:07 AM  Result Value Ref Range Status   Adenovirus NOT DETECTED NOT DETECTED Final   Coronavirus 229E NOT DETECTED NOT DETECTED Final    Comment: (NOTE) The Coronavirus on the Respiratory Panel, DOES NOT test for the novel  Coronavirus (2019 nCoV)    Coronavirus HKU1 NOT DETECTED NOT DETECTED Final   Coronavirus NL63 NOT DETECTED NOT DETECTED Final   Coronavirus OC43 NOT DETECTED NOT DETECTED Final   Metapneumovirus NOT DETECTED NOT DETECTED Final   Rhinovirus / Enterovirus NOT DETECTED NOT DETECTED Final   Influenza A NOT DETECTED NOT DETECTED Final   Influenza B NOT DETECTED NOT DETECTED Final   Parainfluenza Virus 1 NOT DETECTED NOT DETECTED Final   Parainfluenza Virus 2 NOT DETECTED NOT DETECTED Final   Parainfluenza Virus 3 NOT DETECTED NOT DETECTED Final   Parainfluenza Virus 4 NOT DETECTED NOT DETECTED Final   Respiratory Syncytial Virus NOT DETECTED NOT DETECTED Final   Bordetella pertussis NOT DETECTED NOT DETECTED Final   Chlamydophila pneumoniae NOT DETECTED NOT DETECTED Final   Mycoplasma pneumoniae NOT DETECTED NOT DETECTED Final    Comment: Performed at [facility], [place] [address] [place], [place] [address]  Surgical PCR screen     Status: None   Collection Time: 02/28/18  7:52 AM  Result Value Ref Range Status   MRSA, PCR NEGATIVE NEGATIVE Final   Staphylococcus aureus NEGATIVE NEGATIVE Final    Comment: (NOTE) The Xpert SA Assay (FDA approved for NASAL specimens in patients 29 years of age and older), is one component of a comprehensive surveillance program. It is not intended to diagnose infection nor to guide or monitor treatment. Performed at [facility], [address], [place], [place] [address]   Aerobic/Anaerobic Culture (surgical/deep wound)     Status: None (Preliminary result)   Collection Time: 02/28/18  2:27 PM  Result Value Ref Range Status   Specimen Description   Final    WOUND Performed at [facility], [address] [place], [place] [address]    Special Requests   Final    ABSCESS Performed at [facility], [address] [place], [place] [address]    Gram Stain   Final    RARE WBC PRESENT, PREDOMINANTLY PMN RARE GRAM POSITIVE COCCI RARE GRAM VARIABLE ROD RARE GRAM POSITIVE RODS    Culture   Final    NO GROWTH < 12 HOURS Performed at [facility], [place] [address] [place],  [address]    Report Status PENDING  Incomplete     Scheduled Meds: . budesonide (PULMICORT) nebulizer  solution  0.5 mg Nebulization BID  . chlorhexidine gluconate (MEDLINE KIT)  15 mL Mouth Rinse BID  . Chlorhexidine Gluconate Cloth  6 each Topical Daily  . enoxaparin (LOVENOX) injection  30 mg Subcutaneous Q24H  . ipratropium  0.5 mg Nebulization Q6H  . levalbuterol  1.25 mg Nebulization Q6H  . mouth rinse  15 mL Mouth Rinse 10 times per day  . methylPREDNISolone (SOLU-MEDROL) injection  60 mg Intravenous Q8H  . mupirocin ointment  1 application Nasal BID  . pantoprazole (PROTONIX) IV  40 mg Intravenous Q12H  . polyethylene glycol  17 g Oral BID  . sodium chloride flush  10-40 mL Intracatheter Q12H   Continuous Infusions: . sodium chloride Stopped (02/28/18 1229)  . sodium chloride 125 mL/hr at 03/01/18 0645  . sodium chloride    . amiodarone 30 mg/hr (03/01/18 0753)  . ertapenem Stopped (03/01/18 0013)  . phenylephrine (NEO-SYNEPHRINE) Adult infusion      Procedures/Studies: Ct Abdomen Pelvis Wo Contrast  Result Date: 02/27/2018 CLINICAL DATA:  Follow-up diverticulitis. Chronic abdominal pain. EXAM: CT ABDOMEN AND PELVIS WITHOUT CONTRAST TECHNIQUE: Multidetector CT imaging of the abdomen and pelvis was performed following the standard protocol without IV contrast. COMPARISON:  02/20/2018 FINDINGS: Lower chest: New small bilateral pleural effusions and bibasilar atelectasis. Hepatobiliary: Stable small cyst in left hepatic lobe. No mass visualized on this unenhanced exam. Tiny  gallstones again noted, however there is no evidence of cholecystitis or biliary ductal dilatation. Pancreas: No mass or inflammatory process visualized on this unenhanced exam. Spleen:  Within normal limits in size. Adrenals/Urinary tract: No evidence of urolithiasis or hydronephrosis. Unremarkable unopacified urinary bladder. Stomach/Bowel: New small amount of free intraperitoneal air is seen, consistent with bowel perforation. Mild ascites is new since previous study, as well as diffuse mesenteric and body wall edema. Large amount of stool is again seen throughout the colon. A transition point is again seen near the rectosigmoid junction, consistent with stricture. No definite soft tissue mass appreciated by CT. Mild diverticulosis and wall thickening is seen involving the proximal sigmoid, which is similar to previous study and suspicious for mild diverticulitis. No evidence of small bowel wall thickening or obstruction. No abscess identified. Vascular/Lymphatic: No pathologically enlarged lymph nodes identified. No evidence of abdominal aortic aneurysm. Reproductive: Prior hysterectomy. Limited visualization due to artifact from left hip prosthesis. Other:  None. Musculoskeletal:  No suspicious bone lesions identified. IMPRESSION: 1. New small amount of free intraperitoneal air, consistent with bowel perforation. 2. Large colonic stool burden with transition point near the rectosigmoid junction, consistent with stricture. No definite soft tissue mass appreciated by CT. This is unchanged in appearance compared to previous study. Recommend correlation with colonoscopy to exclude obstructing colon carcinoma. 3. Mild diverticulitis of proximal sigmoid colon, without significant change. 4. New mild ascites and diffuse body wall edema. New small bilateral pleural effusions and bibasilar atelectasis. 5. Cholelithiasis. No radiographic evidence of cholecystitis. Critical Value/emergent results were called by telephone  at the time of interpretation on 02/27/2018 at 4:35 pm to Dr. Shanon Brow Lennis Korb , who verbally acknowledged these results. Electronically Signed   By: Earle Gell M.D.   On: 02/27/2018 16:38   Ct Abdomen Pelvis W Contrast  Result Date: 02/20/2018 CLINICAL DATA:  Lower abdominal pain and constipation for the past 2 days. Clinical suspicion for diverticulitis. EXAM: CT ABDOMEN AND PELVIS WITH CONTRAST TECHNIQUE: Multidetector CT imaging of the abdomen and pelvis was performed using the standard protocol following bolus administration of intravenous contrast. CONTRAST:  15m OMNIPAQUE IOHEXOL 300 MG/ML  SOLN COMPARISON:  Abdomen radiographs dated 02/06/2018. Abdomen and pelvis CT dated 04/08/2015. FINDINGS: Lower chest: Unremarkable. Hepatobiliary: No significant change in previously demonstrated liver cysts. Small number of tiny gallstones in the gallbladder measuring up to 4 mm in maximum diameter each. No gallbladder wall thickening or pericholecystic fluid. Pancreas: Unremarkable. No pancreatic ductal dilatation or surrounding inflammatory changes. Spleen: 5 mm oval area of low density in the spleen on image number 29 series 2, difficult to detect with certainty on the previous examination, partly due to differences in technique and timing. Adrenals/Urinary Tract: Normal appearing adrenal glands. Stable tiny exophytic upper pole left renal cyst. Normal appearing right kidney, ureters and urinary bladder. Stomach/Bowel: Large amount of stool throughout the colon to the level of the distal sigmoid colon where there is a short segment of concentric, medium density wall thickening, best seen on image number 72 series 2. The wall thickening is concentric and lower in density on coronal image number 90. Lesser amount of stool and gas in the normal caliber rectum. Minimal sigmoid colon diverticulosis. Mild pericolonic soft tissue stranding involving the distal sigmoid colon, most pronounced at the level of maximal distension  of the colon by the stool. Normal appearing stomach, small bowel and appendix. Vascular/Lymphatic: Atheromatous arterial calcifications without aneurysm. No enlarged lymph nodes. Inferior vena cava filter with its tip just above the level of the renal veins. Reproductive: Status post hysterectomy. No adnexal masses. Other: Small amount of free peritoneal fluid in the pelvis. Musculoskeletal: Left hip prosthesis. Severe right hip degenerative changes with severe joint space narrowing, extensive subarticular cyst formation, spur formation and bony remodeling. Moderate levoconvex thoracolumbar scoliosis. Interbody and pedicle screw and rod fusion at the L4-5 level. Fusion of portions of the T12 and L1 vertebral bodies. Lumbar and lower thoracic spine degenerative changes. IMPRESSION: 1. Large amount of stool throughout the colon to the level of the distal sigmoid colon where there is a short segment of concentric, medium density wall thickening. This is concerning for an obstructing short segment mass. It would be unusual for edema due to diverticulitis to involve that short of a segment of bowel. Correlation with sigmoidoscopy or colonoscopy is recommended. 2. Mild pericolonic soft tissue stranding involving the distal sigmoid colon, most pronounced at the level of maximal distension of the colon by the stool. This is suspicious for mild diverticulitis without abscess. There is minimal diverticulosis in that region of the colon. 3. Cholelithiasis. 4. 5 mm probable hemangioma in the spleen. Electronically Signed   By: SClaudie ReveringM.D.   On: 02/20/2018 20:59   Dg Chest Port 1 View  Result Date: 03/01/2018 CLINICAL DATA:  Ventilator dependent respiratory failure. Follow-up pulmonary edema. EXAM: PORTABLE CHEST 1 VIEW COMPARISON:  02/28/2018, 02/28/2016 and earlier. FINDINGS: Endotracheal tube tip in satisfactory position projecting approximately 3-4 cm above the carina. RIGHT arm PICC tip projects at or near the  cavoatrial junction, unchanged. Nasogastric tube looped in the stomach with its tip in the fundus. Markedly suboptimal inspiration with worsening atelectasis in the lung bases, LEFT greater than RIGHT. Resolution of interstitial pulmonary edema. Persistent pulmonary venous hypertension. IMPRESSION: 1.  Support apparatus satisfactory. 2. Resolution of interstitial pulmonary edema since yesterday, though pulmonary venous hypertension persists. 3. Worsening bibasilar atelectasis, LEFT greater than RIGHT. Electronically Signed   By: TEvangeline DakinM.D.   On: 03/01/2018 09:01   Dg Chest Port 1 View  Result Date: 02/28/2018 CLINICAL DATA:  Intubation. EXAM: PORTABLE CHEST 1  VIEW COMPARISON:  02/26/2018 FINDINGS: The endotracheal tube is 3.3 cm above the carina. There is an NG tube coursing down the esophagus and into the stomach. The right-sided PICC line is stable. Significant worsening lung aeration with very low lung volumes, vascular crowding, pulmonary edema and basilar atelectasis. The heart remains enlarged and there is tortuosity and calcification of the thoracic aorta. IMPRESSION: 1. The endotracheal tube and NG tubes are in good position. 2. Very low lung volumes with vascular crowding and atelectasis 3. Vascular congestion and pulmonary edema. Electronically Signed   By: Marijo Sanes M.D.   On: 02/28/2018 16:37   Dg Chest Port 1 View  Result Date: 02/26/2018 CLINICAL DATA:  84 year old female with intermittent productive cough EXAM: PORTABLE CHEST 1 VIEW COMPARISON:  02/28/2016 FINDINGS: Cardiomediastinal silhouette unchanged. Interval development of asymmetric elevation the right hemidiaphragm. No pneumothorax. Low lung volumes with coarsened interstitial markings and interlobular septal thickening. No large pleural effusion. No confluent airspace disease. Interval placement of right upper extremity PICC with the tip appearing to terminate superior vena cava. Degenerative changes of the left  glenohumeral joint. IMPRESSION: Low lung volumes with questionable edema. New asymmetric elevation the right hemidiaphragm, uncertain significance. Right upper extremity PICC. Electronically Signed   By: Corrie Mckusick D.O.   On: 02/26/2018 19:28   Dg Abd 2 Views  Result Date: 02/06/2018 CLINICAL DATA:  IVC filter placement evaluation. EXAM: ABDOMEN - 2 VIEW COMPARISON:  03/29/2017. FINDINGS: Lumbar spine numbered as per prior exam. IVC filter noted with tip at approximately the L1 level. Degenerative changes and scoliosis thoracic spine. L4-L5 fusion. Rounded calcific densities noted over the right upper quadrant, these could represent gallstones, kidney stones, undigested pill fragments. No bowel distention or free air. Stool noted throughout the colon and rectum. IMPRESSION: IVC filter noted with proximal tip at the L1 level. Electronically Signed   By: Marcello Moores  Register   On: 02/06/2018 14:21   [place] Ekg Site Rite  Result Date: 02/22/2018 If Site Rite image not attached, placement could not be confirmed due to current cardiac rhythm.   Orson Eva, DO  [facility] Pager [phone]  If 7PM-7AM, please contact night-coverage [email or web address] Password [email or web address] 03/01/2018, 10:43 AM   LOS: 9 days

## 2018-03-01 NOTE — Progress Notes (Signed)
[facility] Progress Note  1 Day Post-Op  Subjective: Doing fair. Weaning on pressors. Responsive. UOP picked up overnight with 2L bolus. CXR this AM improved. Dr. Luan Pulling following.   Objective: Vital signs in last 24 hours: Temp:  [98.4 F (36.9 C)-99.4 F (37.4 C)] 98.6 F (37 C) (02/15 1126) Pulse Rate:  [72-145] 72 (02/15 1200) Resp:  [17-28] 21 (02/15 1200) BP: (72-136)/(24-65) 114/59 (02/15 1200) SpO2:  [93 %-100 %] 97 % (02/15 1200) Arterial Line BP: (59-158)/(48-149) 92/65 (02/15 1200) FiO2 (%):  [35 %-60 %] 35 % (02/15 0840) Weight:  [546 kg] 112 kg (02/14 1308) Last BM Date: 02/23/18  Intake/Output from previous day: 02/14 0701 - 02/15 0700 In: 7904.9 [I.V.:6854.8; IV Piggyback:1050.1] Out: 435 [Urine:385; Blood:50] Intake/Output this shift: Total I/O In: 1323.7 [I.V.:1323.7] Out: 145 [Urine:145]  General appearance: no distress and intubated but responds to commands Resp: intubated, appears comfortable GI: soft, tender, distended, midline with packing and intermittent staples between, minor drainage noted, ostomy slightly retracted edematous, but looks pink Extremities: moves extremities  Lab Results:  Recent Labs    02/28/18 0601 03/01/18 0419  WBC 26.2* 25.0*  HGB 13.1 10.4*  HCT 40.1 31.7*  PLT 328 367   BMET Recent Labs    02/28/18 0601 03/01/18 0419  NA 133* 134*  K 4.5 4.3  CL 96* 105  CO2 24 21*  GLUCOSE 100* 162*  BUN 31* 40*  CREATININE 2.23* 1.95*  CALCIUM 8.8* 7.7*    Studies/Results: Ct Abdomen Pelvis Wo Contrast  Result Date: 02/27/2018 CLINICAL DATA:  Follow-up diverticulitis. Chronic abdominal pain. EXAM: CT ABDOMEN AND PELVIS WITHOUT CONTRAST TECHNIQUE: Multidetector CT imaging of the abdomen and pelvis was performed following the standard protocol without IV contrast. COMPARISON:  02/20/2018 FINDINGS: Lower chest: New small bilateral pleural effusions and bibasilar atelectasis. Hepatobiliary: Stable small  cyst in left hepatic lobe. No mass visualized on this unenhanced exam. Tiny gallstones again noted, however there is no evidence of cholecystitis or biliary ductal dilatation. Pancreas: No mass or inflammatory process visualized on this unenhanced exam. Spleen:  Within normal limits in size. Adrenals/Urinary tract: No evidence of urolithiasis or hydronephrosis. Unremarkable unopacified urinary bladder. Stomach/Bowel: New small amount of free intraperitoneal air is seen, consistent with bowel perforation. Mild ascites is new since previous study, as well as diffuse mesenteric and body wall edema. Large amount of stool is again seen throughout the colon. A transition point is again seen near the rectosigmoid junction, consistent with stricture. No definite soft tissue mass appreciated by CT. Mild diverticulosis and wall thickening is seen involving the proximal sigmoid, which is similar to previous study and suspicious for mild diverticulitis. No evidence of small bowel wall thickening or obstruction. No abscess identified. Vascular/Lymphatic: No pathologically enlarged lymph nodes identified. No evidence of abdominal aortic aneurysm. Reproductive: Prior hysterectomy. Limited visualization due to artifact from left hip prosthesis. Other:  None. Musculoskeletal:  No suspicious bone lesions identified. IMPRESSION: 1. New small amount of free intraperitoneal air, consistent with bowel perforation. 2. Large colonic stool burden with transition point near the rectosigmoid junction, consistent with stricture. No definite soft tissue mass appreciated by CT. This is unchanged in appearance compared to previous study. Recommend correlation with colonoscopy to exclude obstructing colon carcinoma. 3. Mild diverticulitis of proximal sigmoid colon, without significant change. 4. New mild ascites and diffuse body wall edema. New small bilateral pleural effusions and bibasilar atelectasis. 5. Cholelithiasis. No radiographic evidence  of cholecystitis. Critical Value/emergent results were called by telephone  at the time of interpretation on 02/27/2018 at 4:35 pm to Dr. Shanon Brow TAT , who verbally acknowledged these results. Electronically Signed   By: Earle Gell M.D.   On: 02/27/2018 16:38   Dg Chest Port 1 View  Result Date: 03/01/2018 CLINICAL DATA:  Ventilator dependent respiratory failure. Follow-up pulmonary edema. EXAM: PORTABLE CHEST 1 VIEW COMPARISON:  02/28/2018, 02/28/2016 and earlier. FINDINGS: Endotracheal tube tip in satisfactory position projecting approximately 3-4 cm above the carina. RIGHT arm PICC tip projects at or near the cavoatrial junction, unchanged. Nasogastric tube looped in the stomach with its tip in the fundus. Markedly suboptimal inspiration with worsening atelectasis in the lung bases, LEFT greater than RIGHT. Resolution of interstitial pulmonary edema. Persistent pulmonary venous hypertension. IMPRESSION: 1.  Support apparatus satisfactory. 2. Resolution of interstitial pulmonary edema since yesterday, though pulmonary venous hypertension persists. 3. Worsening bibasilar atelectasis, LEFT greater than RIGHT. Electronically Signed   By: Evangeline Dakin M.D.   On: 03/01/2018 09:01   Dg Chest Port 1 View  Result Date: 02/28/2018 CLINICAL DATA:  Intubation. EXAM: PORTABLE CHEST 1 VIEW COMPARISON:  02/26/2018 FINDINGS: The endotracheal tube is 3.3 cm above the carina. There is an NG tube coursing down the esophagus and into the stomach. The right-sided PICC line is stable. Significant worsening lung aeration with very low lung volumes, vascular crowding, pulmonary edema and basilar atelectasis. The heart remains enlarged and there is tortuosity and calcification of the thoracic aorta. IMPRESSION: 1. The endotracheal tube and NG tubes are in good position. 2. Very low lung volumes with vascular crowding and atelectasis 3. Vascular congestion and pulmonary edema. Electronically Signed   By: Marijo Sanes M.D.   On:  02/28/2018 16:37    Anti-infectives: Anti-infectives (From admission, onward)   Start     Dose/Rate Route Frequency Ordered Stop   02/28/18 1800  ertapenem (INVANZ) 500 mg in sodium chloride 0.9 % 50 mL IVPB     500 mg 100 mL/hr over 30 Minutes Intravenous Every 24 hours 02/28/18 1035     02/27/18 1800  ertapenem (INVANZ) 1,000 mg in sodium chloride 0.9 % 100 mL IVPB  Status:  Discontinued     1 g 200 mL/hr over 30 Minutes Intravenous Every 24 hours 02/27/18 1705 02/28/18 1035   02/21/18 0800  ciprofloxacin (CIPRO) IVPB 400 mg  Status:  Discontinued     400 mg 200 mL/hr over 60 Minutes Intravenous Every 12 hours 02/20/18 2204 02/27/18 1908   02/21/18 0500  metroNIDAZOLE (FLAGYL) IVPB 500 mg  Status:  Discontinued     500 mg 100 mL/hr over 60 Minutes Intravenous Every 8 hours 02/20/18 2204 02/27/18 1908   02/20/18 2115  ciprofloxacin (CIPRO) IVPB 400 mg     400 mg 200 mL/hr over 60 Minutes Intravenous  Once 02/20/18 2113 02/20/18 2335   02/20/18 2115  metroNIDAZOLE (FLAGYL) tablet 500 mg     500 mg Oral  Once 02/20/18 2113 02/20/18 2147      Assessment/Plan: Ms. Humann is a 84 yo POD 1 s/p Ex lap with partial colectomy end colostomy for perforation and fecal peritonitis related to what appears to be a benign stricture on colonoscopy. Doing fair and weaning from pressors. Remains on ventilator.  PRN fentanyl for pain, waking up Weaning Pressors, MAP Goal 65, arterial line in place  On ventilator, appreciate Dr. Luan Pulling support, CXR with improved edema and low volumes  NPO, NG in place, d/c the miralax  Dressing orders ordered Ostomy RN and supplies ordered,  ostomy looking pink which is good given the hypotension and pressor needs  UOP picked up, did get some boluses overnight, may need more pending UOP, Cr down  Invanz for fecal peritonitis for now, WBC elevated as expected, H&H drifted down, no signs of bleeding  SCDs, lovenox    LOS: 9 days    Virl Cagey 03/01/2018

## 2018-03-01 NOTE — Progress Notes (Signed)
Patient intubated, watching TV no problems with vent. Will try her on pressure support if she keeps doing well so hopefull she can be extubated in am. Lung sounds basically clear. Rate set at 14 which she is doing about 6 over, for total of 20.

## 2018-03-01 NOTE — Consult Note (Signed)
Consult requested by: Dr. Arnoldo Morale Consult requested for: Respiratory failure  HPI: I discussed her situation by telephone with Dr. Arnoldo Morale before I realized that he was not on-call this weekend.  He did asked that I go ahead and get involved with this patient's care since she is ventilator dependent.  Her history is that she came to the hospital with abdominal pain.  She has history of hypertension scoliosis DVT IVC filter placement incontinence of bladder and who came to the hospital after she developed abdominal pain and constipation.  She was found to have a large amount of stool in the colon felt that there might be a obstruction and possible diverticulitis.  She was started on treatment had various procedures done and eventually came to surgery for colectomy with colostomy after she was found to have perforated bowel.  She had significant fecal peritonitis.  She was left intubated after surgery came to the intensive care unit.  She developed atrial fib with rapid ventricular response and hypotension and she is now on amiodarone drip and Neo-Synephrine drip.  She is unresponsive.  History is from the medical record as she is intubated on the ventilator and there is no family available  Past Medical History:  Diagnosis Date  . Anemia   . Anxiety    pt denies  . Arthritis    KNEES AND BACK  . Bladder incontinence   . Cancer ([place])    basal cell cancer on nose  . Edema of both feet   . Hypertension   . Pneumonia   . PONV (postoperative nausea and vomiting)    PONV X 1 EPISODE, COMES OUT OF ANESTHESIA FAST, SOME AWARENESS DURING END OF COLONSCOPY  . Scoliosis      Family History  Problem Relation Age of Onset  . Congestive Heart Failure Mother   . Colon cancer Father   . Cancer Brother   . Arthritis Sister   . Asthma Sister   . Diabetes Son   . Diabetes Son      Social History   Socioeconomic History  . Marital status: Married    Spouse name: Not on file  . Number of children:  3  . Years of education: 87  . Highest education level: Associate degree: occupational, Hotel manager, or vocational program  Occupational History  . Occupation: Retired    Comment: Corporate treasurer  Social Needs  . Financial resource strain: Somewhat hard  . Food insecurity:    Worry: Never true    Inability: Never true  . Transportation needs:    Medical: No    Non-medical: No  Tobacco Use  . Smoking status: Never Smoker  . Smokeless tobacco: Never Used  Substance and Sexual Activity  . Alcohol use: Yes    Comment: OCC WINE  . Drug use: No  . Sexual activity: Not on file  Lifestyle  . Physical activity:    Days per week: 0 days    Minutes per session: 0 min  . Stress: Only a little  Relationships  . Social connections:    Talks on phone: More than three times a week    Gets together: Once a week    Attends religious service: Never    Active member of club or organization: No    Attends meetings of clubs or organizations: Never    Relationship status: Married  Other Topics Concern  . Not on file  Social History Narrative  . Not on file     ROS: Not  obtainable    Objective: Vital signs in last 24 hours: Temp:  [98.4 F (36.9 C)-99.4 F (37.4 C)] 99.4 F (37.4 C) (02/15 0749) Pulse Rate:  [72-145] 77 (02/15 0749) Resp:  [17-28] 24 (02/15 0749) BP: (72-136)/(24-57) 110/44 (02/15 0700) SpO2:  [93 %-100 %] 97 % (02/15 0749) Arterial Line BP: (59-158)/(48-149) 141/55 (02/15 0700) FiO2 (%):  [35 %-60 %] 35 % (02/15 0500) Weight:  [937 kg] 112 kg (02/14 1308) Weight change:  Last BM Date: 02/23/18  Intake/Output from previous day: 02/14 0701 - 02/15 0700 In: 7904.9 [I.V.:6854.8; IV Piggyback:1050.1] Out: 435 [Urine:385; Blood:50]  PHYSICAL EXAM Constitutional: She is intubated and on the ventilator.  Eyes: Pupils react.  Ears nose mouth and throat: She has endotracheal and gastric tubes in place.  Mucous membranes are slightly dry.  Cardiovascular: Looks like  she is back in sinus rhythm with PACs now.  Respiratory: She has bilateral rhonchi.  Gastrointestinal: Absent bowel sounds musculoskeletal: Unable to assess neurological: Unable to assess psychiatric: Unable to assess skin: She looks like she has some third spacing of fluid  Lab Results: Basic Metabolic Panel: Recent Labs    02/28/18 0601 03/01/18 0419  NA 133* 134*  K 4.5 4.3  CL 96* 105  CO2 24 21*  GLUCOSE 100* 162*  BUN 31* 40*  CREATININE 2.23* 1.95*  CALCIUM 8.8* 7.7*  MG 2.5* 2.4  PHOS  --  4.0   Liver Function Tests: Recent Labs    02/27/18 0552  AST 17  ALT 11  ALKPHOS 55  BILITOT 0.8  PROT 5.1*  ALBUMIN 2.3*   No results for input(s): LIPASE, AMYLASE in the last 72 hours. No results for input(s): AMMONIA in the last 72 hours. CBC: Recent Labs    02/28/18 0601 03/01/18 0419  WBC 26.2* 25.0*  HGB 13.1 10.4*  HCT 40.1 31.7*  MCV 93.9 94.3  PLT 328 367   Cardiac Enzymes: No results for input(s): CKTOTAL, CKMB, CKMBINDEX, TROPONINI in the last 72 hours. BNP: No results for input(s): PROBNP in the last 72 hours. D-Dimer: No results for input(s): DDIMER in the last 72 hours. CBG: Recent Labs    02/28/18 1659 02/28/18 2057 03/01/18 0812  GLUCAP 144* 145* 157*   Hemoglobin A1C: No results for input(s): HGBA1C in the last 72 hours. Fasting Lipid Panel: No results for input(s): CHOL, HDL, LDLCALC, TRIG, CHOLHDL, LDLDIRECT in the last 72 hours. Thyroid Function Tests: No results for input(s): TSH, T4TOTAL, FREET4, T3FREE, THYROIDAB in the last 72 hours. Anemia Panel: No results for input(s): VITAMINB12, FOLATE, FERRITIN, TIBC, IRON, RETICCTPCT in the last 72 hours. Coagulation: No results for input(s): LABPROT, INR in the last 72 hours. Urine Drug Screen: Drugs of Abuse  No results found for: LABOPIA, COCAINSCRNUR, LABBENZ, AMPHETMU, THCU, LABBARB  Alcohol Level: No results for input(s): ETH in the last 72 hours. Urinalysis: Recent Labs     02/26/18 1712  COLORURINE YELLOW  LABSPEC 1.014  PHURINE 8.0  GLUCOSEU NEGATIVE  HGBUR SMALL*  BILIRUBINUR NEGATIVE  KETONESUR NEGATIVE  PROTEINUR NEGATIVE  NITRITE NEGATIVE  LEUKOCYTESUR NEGATIVE   Misc. Labs:   ABGS: Recent Labs    03/01/18 0520  PHART 7.401  PO2ART 85.6  HCO3 22.4     MICROBIOLOGY: Recent Results (from the past 240 hour(s))  Culture, blood (Routine X 2) w Reflex to ID Panel     Status: None (Preliminary result)   Collection Time: 02/26/18  9:25 AM  Result Value Ref Range Status  Specimen Description LEFT ANTECUBITAL  Final   Special Requests   Final    BOTTLES DRAWN AEROBIC AND ANAEROBIC Blood Culture adequate volume   Culture   Final    NO GROWTH 3 DAYS Performed at [facility], [address], [place], [place] [address]    Report Status PENDING  Incomplete  Culture, blood (Routine X 2) w Reflex to ID Panel     Status: None (Preliminary result)   Collection Time: 02/26/18  9:33 AM  Result Value Ref Range Status   Specimen Description BLOOD LEFT HAND  Final   Special Requests   Final    BOTTLES DRAWN AEROBIC AND ANAEROBIC Blood Culture adequate volume   Culture   Final    NO GROWTH 3 DAYS Performed at [facility], [address], [place], [place] [address]    Report Status PENDING  Incomplete  Culture, Urine     Status: None   Collection Time: 02/26/18  5:12 PM  Result Value Ref Range Status   Specimen Description   Final    URINE, RANDOM Performed at [facility], [address], [place], [place] [address]    Special Requests   Final    NONE Performed at [facility], [address], [place], [place] [address]    Culture   Final    NO GROWTH Performed at [facility], [place] [address], [place], [place] [address]    Report Status 02/28/2018 FINAL  Final  Respiratory Panel by PCR     Status: None   Collection Time: 02/27/18  8:07 AM  Result Value Ref Range Status   Adenovirus NOT DETECTED NOT DETECTED Final   Coronavirus 229E  NOT DETECTED NOT DETECTED Final    Comment: (NOTE) The Coronavirus on the Respiratory Panel, DOES NOT test for the novel  Coronavirus (2019 nCoV)    Coronavirus HKU1 NOT DETECTED NOT DETECTED Final   Coronavirus NL63 NOT DETECTED NOT DETECTED Final   Coronavirus OC43 NOT DETECTED NOT DETECTED Final   Metapneumovirus NOT DETECTED NOT DETECTED Final   Rhinovirus / Enterovirus NOT DETECTED NOT DETECTED Final   Influenza A NOT DETECTED NOT DETECTED Final   Influenza B NOT DETECTED NOT DETECTED Final   Parainfluenza Virus 1 NOT DETECTED NOT DETECTED Final   Parainfluenza Virus 2 NOT DETECTED NOT DETECTED Final   Parainfluenza Virus 3 NOT DETECTED NOT DETECTED Final   Parainfluenza Virus 4 NOT DETECTED NOT DETECTED Final   Respiratory Syncytial Virus NOT DETECTED NOT DETECTED Final   Bordetella pertussis NOT DETECTED NOT DETECTED Final   Chlamydophila pneumoniae NOT DETECTED NOT DETECTED Final   Mycoplasma pneumoniae NOT DETECTED NOT DETECTED Final    Comment: Performed at [facility], [place] [address] [place], [place] [address]  Surgical PCR screen     Status: None   Collection Time: 02/28/18  7:52 AM  Result Value Ref Range Status   MRSA, PCR NEGATIVE NEGATIVE Final   Staphylococcus aureus NEGATIVE NEGATIVE Final    Comment: (NOTE) The Xpert SA Assay (FDA approved for NASAL specimens in patients 18 years of age and older), is one component of a comprehensive surveillance program. It is not intended to diagnose infection nor to guide or monitor treatment. Performed at [facility], [address], [place], [place] [address]   Aerobic/Anaerobic Culture (surgical/deep wound)     Status: None (Preliminary result)   Collection Time: 02/28/18  2:27 PM  Result Value Ref Range Status   Specimen Description  Final    WOUND Performed at [facility], [address] [place], [place] [address]    Special Requests   Final    ABSCESS Performed at [facility], [address]  [place], [place] [address]    Gram Stain   Final    RARE WBC PRESENT, PREDOMINANTLY PMN RARE GRAM POSITIVE COCCI RARE GRAM VARIABLE ROD RARE GRAM POSITIVE RODS    Culture   Final    NO GROWTH < 12 HOURS Performed at [facility], [place] [address] [place],  [address]    Report Status PENDING  Incomplete    Studies/Results: Ct Abdomen Pelvis Wo Contrast  Result Date: 02/27/2018 CLINICAL DATA:  Follow-up diverticulitis. Chronic abdominal pain. EXAM: CT ABDOMEN AND PELVIS WITHOUT CONTRAST TECHNIQUE: Multidetector CT imaging of the abdomen and pelvis was performed following the standard protocol without IV contrast. COMPARISON:  02/20/2018 FINDINGS: Lower chest: New small bilateral pleural effusions and bibasilar atelectasis. Hepatobiliary: Stable small cyst in left hepatic lobe. No mass visualized on this unenhanced exam. Tiny gallstones again noted, however there is no evidence of cholecystitis or biliary ductal dilatation. Pancreas: No mass or inflammatory process visualized on this unenhanced exam. Spleen:  Within normal limits in size. Adrenals/Urinary tract: No evidence of urolithiasis or hydronephrosis. Unremarkable unopacified urinary bladder. Stomach/Bowel: New small amount of free intraperitoneal air is seen, consistent with bowel perforation. Mild ascites is new since previous study, as well as diffuse mesenteric and body wall edema. Large amount of stool is again seen throughout the colon. A transition point is again seen near the rectosigmoid junction, consistent with stricture. No definite soft tissue mass appreciated by CT. Mild diverticulosis and wall thickening is seen involving the proximal sigmoid, which is similar to previous study and suspicious for mild diverticulitis. No evidence of small bowel wall thickening or obstruction. No abscess identified. Vascular/Lymphatic: No pathologically enlarged lymph nodes identified. No evidence of abdominal aortic aneurysm. Reproductive:  Prior hysterectomy. Limited visualization due to artifact from left hip prosthesis. Other:  None. Musculoskeletal:  No suspicious bone lesions identified. IMPRESSION: 1. New small amount of free intraperitoneal air, consistent with bowel perforation. 2. Large colonic stool burden with transition point near the rectosigmoid junction, consistent with stricture. No definite soft tissue mass appreciated by CT. This is unchanged in appearance compared to previous study. Recommend correlation with colonoscopy to exclude obstructing colon carcinoma. 3. Mild diverticulitis of proximal sigmoid colon, without significant change. 4. New mild ascites and diffuse body wall edema. New small bilateral pleural effusions and bibasilar atelectasis. 5. Cholelithiasis. No radiographic evidence of cholecystitis. Critical Value/emergent results were called by telephone at the time of interpretation on 02/27/2018 at 4:35 pm to Dr. Shanon Brow TAT , who verbally acknowledged these results. Electronically Signed   By: Earle Gell M.D.   On: 02/27/2018 16:38   Dg Chest Port 1 View  Result Date: 03/01/2018 CLINICAL DATA:  Ventilator dependent respiratory failure. Follow-up pulmonary edema. EXAM: PORTABLE CHEST 1 VIEW COMPARISON:  02/28/2018, 02/28/2016 and earlier. FINDINGS: Endotracheal tube tip in satisfactory position projecting approximately 3-4 cm above the carina. RIGHT arm PICC tip projects at or near the cavoatrial junction, unchanged. Nasogastric tube looped in the stomach with its tip in the fundus. Markedly suboptimal inspiration with worsening atelectasis in the lung bases, LEFT greater than RIGHT. Resolution of interstitial pulmonary edema. Persistent pulmonary venous hypertension. IMPRESSION: 1.  Support apparatus satisfactory. 2. Resolution of interstitial pulmonary edema since yesterday, though pulmonary venous hypertension persists. 3. Worsening bibasilar atelectasis,  LEFT greater than RIGHT. Electronically Signed   By: Evangeline Dakin M.D.   On: 03/01/2018 09:01   Dg Chest Port 1 View  Result Date: 02/28/2018 CLINICAL DATA:  Intubation. EXAM: PORTABLE CHEST 1 VIEW COMPARISON:  02/26/2018 FINDINGS: The endotracheal tube is 3.3 cm above the carina. There is an NG tube coursing down the esophagus and into the stomach. The right-sided PICC line is stable. Significant worsening lung aeration with very low lung volumes, vascular crowding, pulmonary edema and basilar atelectasis. The heart remains enlarged and there is tortuosity and calcification of the thoracic aorta. IMPRESSION: 1. The endotracheal tube and NG tubes are in good position. 2. Very low lung volumes with vascular crowding and atelectasis 3. Vascular congestion and pulmonary edema. Electronically Signed   By: Marijo Sanes M.D.   On: 02/28/2018 16:37    Medications:  Prior to Admission:  Medications Prior to Admission  Medication Sig Dispense Refill Last Dose  . aspirin EC 81 MG tablet Take 81 mg by mouth daily.   02/20/2018 at Unknown time  . Biotin 1 MG CAPS Take by mouth.   02/19/2018 at Unknown time  . Cholecalciferol (VITAMIN D3) 5000 units TABS Take 5,000 Units by mouth daily.   02/19/2018 at Unknown time  . CRANBERRY PO Take by mouth.   02/20/2018 at Unknown time  . Docusate Sodium (STOOL SOFTENER LAXATIVE PO) Take 2 tablets by mouth every 6 (six) hours as needed.   02/20/2018 at Unknown time  . furosemide (LASIX) 20 MG tablet Take 61m twice daily, at 6am and 2pm 120 tablet 5 02/19/2018 at Unknown time  . GLUCOSAMINE SULFATE PO Take 2 tablets by mouth daily.    02/19/2018 at Unknown time  . Hypromellose (ARTIFICIAL TEARS OP) Place 2 drops into both eyes daily as needed (for dry eyes).   02/19/2018 at Unknown time  . losartan (COZAAR) 25 MG tablet TAKE 1 TABLET(25 MG) BY MOUTH EVERY MORNING 90 tablet 1 02/20/2018 at Unknown time  . Magnesium 250 MG TABS Take by mouth.   02/19/2018 at Unknown time  . magnesium hydroxide (MILK OF MAGNESIA) 400 MG/5ML suspension Take 15 mLs  by mouth daily as needed for mild constipation. 355 mL 0 02/20/2018 at Unknown time  . montelukast (SINGULAIR) 10 MG tablet Take 10 mg by mouth every morning.  0 02/19/2018 at Unknown time  . polyethylene glycol powder (GLYCOLAX/MIRALAX) powder Take 17 g by mouth 2 (two) times daily as needed. 3350 g 1 02/20/2018 at Unknown time  . vitamin B-12 (CYANOCOBALAMIN) 500 MCG tablet Take 500 mcg by mouth daily.   02/19/2018 at Unknown time  . HYDROcodone-acetaminophen (NORCO) 10-325 MG tablet Take 1 tablet by mouth every 6 (six) hours as needed. 20 tablet 0 unknown   Scheduled: . budesonide (PULMICORT) nebulizer solution  0.5 mg Nebulization BID  . chlorhexidine gluconate (MEDLINE KIT)  15 mL Mouth Rinse BID  . Chlorhexidine Gluconate Cloth  6 each Topical Daily  . enoxaparin (LOVENOX) injection  30 mg Subcutaneous Q24H  . ipratropium  0.5 mg Nebulization Q6H  . levalbuterol  1.25 mg Nebulization Q6H  . magnesium hydroxide  30 mL Oral BID  . mouth rinse  15 mL Mouth Rinse 10 times per day  . methylPREDNISolone (SOLU-MEDROL) injection  60 mg Intravenous Q8H  . mupirocin ointment  1 application Nasal BID  . pantoprazole (PROTONIX) IV  40 mg Intravenous Q12H  . polyethylene glycol  17 g Oral BID  . sodium chloride flush  10-40  mL Intracatheter Q12H   Continuous: . sodium chloride Stopped (02/28/18 1229)  . sodium chloride 125 mL/hr at 03/01/18 0645  . sodium chloride    . amiodarone 30 mg/hr (03/01/18 0753)  . ertapenem Stopped (03/01/18 0013)  . phenylephrine (NEO-SYNEPHRINE) Adult infusion 70 mcg/min (03/01/18 0851)   NID:POEUM/PNTIRWER arterial line **AND** sodium chloride, acetaminophen **OR** acetaminophen, fentaNYL (SUBLIMAZE) injection, fentaNYL (SUBLIMAZE) injection, fentaNYL (SUBLIMAZE) injection, hydrALAZINE, HYDROmorphone (DILAUDID) injection, iohexol, iopamidol, levalbuterol, LORazepam, midazolam, midazolam, ondansetron **OR** ondansetron (ZOFRAN) IV, oxyCODONE-acetaminophen, phenol, sodium  chloride flush, sodium chloride flush  Assesment: She was admitted with abdominal pain and ended up having a bowel perforation and fecal peritonitis.  She had surgery yesterday with colectomy and colostomy creation.  This is been complicated by pulmonary edema per chest x-ray that I have personally reviewed.  Chest x-ray this morning is better.  She also had hypotension and remains on Neo-Synephrine and atrial fib with rapid ventricular response and she remains on amiodarone but looks like she may have converted back to sinus rhythm with PACs now.    She has respiratory failure and remains on the ventilator and there is no opportunity to try to wean today .   She had acute kidney injury and her renal function is slightly better Active Problems:   Mass of colon   Abnormal CT scan, sigmoid colon   Diverticulitis of large intestine without perforation or abscess without bleeding   Large bowel obstruction (HCC)   Constipation   Fever   AKI (acute kidney injury) ([place])   Diverticulitis of large intestine with perforation   Bowel perforation (HCC)   Fecal peritonitis ([place])    Plan: Continue current treatments.  Remain on the ventilator.  Chest x-ray in the morning.    LOS: 9 days   Regina Hall 03/01/2018, 9:24 AM

## 2018-03-01 NOTE — Progress Notes (Signed)
Initial Nutrition Assessment  DOCUMENTATION CODES:  Obesity unspecified  INTERVENTION:  Given pts apparent suboptimal nutrition pre-op, recommend initiation of enteral support as soon as hemodynamically stable w/ MAP >/= 60 and cleared by surgery. Recs are as follow:  Initiate Vital High Protein at 20 cc/hr and titrate up 10cc q4hrs to goal rate of 60 ml/h (1440 ml per day). Goal rate provides 1440 kcals, 126 gm protein, 1204 ml free water daily.  NUTRITION DIAGNOSIS:  Inadequate oral intake related to inability to eat as evidenced by NPO status.  GOAL:  Provide needs based on ASPEN/SCCM guidelines  MONITOR:  PO intake, Diet advancement, Vent status, Skin, Labs, I & O's, Weight trends  REASON FOR ASSESSMENT:  Ventilator    ASSESSMENT:  84 y/o female PMHx HTN, anxiety, CKD. Originally presented to [facility] on 2/6 w/ constipation and abdominal pain x3 days. CT showed mass/obstruction of sigmoid colon. Admitted for management.   Since admit, pts intake records have been as follows: 2/6-2/7: NPO 2/8-2/9: CL diet: 50-100% of meals 2/9: FL diet: 50% of meals 2/10-2/13: Soft diet: Mostly ate 25-50% of meals  2/14- XX: NPO  Wt hx: Admitted at 243 lbs. Last wt taken 2/14: 247 lbs. She appears to have gained weight over the several preceeding months. Wt was in the 230s this past fall and it appears her wt has fluctuated between  220-235 for the past year.   On 2/13, pt noted acute onset R abd pain. She was made NPO. CT evaluation showed new intraperitoneal air. She developed AKI and leukocytosis and ultimately underwent exlap w/ partial colectomy and colostomy 2/14. In OR, found to have fecal peritonitis s/p washout. Postop, pt left intubated and transferred to ICU. She developed tachycardia and hypotension, requiring initiation of vasopressors.   Per pulmonary note, no weaning to be attempted  today. Pt is noted as currently being unresponsive.  [name] intake appears to have been poor-fair  since admission, only eating </50% of non-CL meals. Given suboptimal nutrition status, prompt initiation of enteral support would be recommended after cleared by surgery and pressures stabilize.   Patient is currently intubated on ventilator support MV: 6.6 L/min Temp (24hrs), Avg:98.8 F (37.1 C), Min:98.4 F (36.9 C), Max:99.4 F (37.4 C) Propofol: None  Labs:  BG x24 hrs: 140-165, BUN/Creat:40/1.95 (17/1.21 pre-op) Meds: Methylprednisolone, ppi, miralax,  Infusions: Amiodarone, IVF, IV abx,  Pressor support: Neo-synephrine  Recent Labs  Lab 02/27/18 0552 02/28/18 0601 03/01/18 0419  NA 135 133* 134*  K 3.9 4.5 4.3  CL 102 96* 105  CO2 23 24 21*  BUN 17 31* 40*  CREATININE 1.21* 2.23* 1.95*  CALCIUM 8.5* 8.8* 7.7*  MG  --  2.5* 2.4  PHOS  --   --  4.0  GLUCOSE 158* 100* 162*   NUTRITION - FOCUSED PHYSICAL EXAM: Unable to perform  Diet Order:   Diet Order            Diet NPO time specified  Diet effective now             EDUCATION NEEDS:  No education needs have been identified at this time  Skin:  Fresh surgical incision to abdomen New colostomy MASD to abdomen   Last BM:  2/9   Height:  Ht Readings from Last 1 Encounters:  03/01/18 5\' 6"  (1.676 m)   Weight:  Wt Readings from Last 1 Encounters:  02/28/18 112 kg   Wt Readings from Last 10 Encounters:  02/28/18 112 kg  01/29/18  97.1 kg  12/19/17 103.4 kg  12/09/17 100.5 kg  07/24/17 95.3 kg  07/21/17 95.3 kg  06/06/17 105.6 kg  05/29/17 106.1 kg  05/02/17 106.1 kg  04/03/17 107.5 kg   Ideal Body Weight:  59.1 kg  BMI:  Body mass index is 39.85 kg/m.  Dosing wt: 243 lbs (110.5 kg)  Estimated Nutritional Needs:  Kcal:  1220-1550 kcals (11-14 kcal/kg bw) Protein:  >118g (2g/kg ibw) Fluid:  Per MDs assessment  Burtis Junes RD, LDN, CNSC Clinical Nutrition Available Tues-Sat via Pager: [phone] 03/01/2018 11:03 AM

## 2018-03-01 NOTE — Progress Notes (Signed)
[facility] Progress Note   Subjective:   In ICU on POD 1 partial colectomy end colostomy for perforation and fecal peritonitis (benign stricture).   She remains intubated in ICU.  She is on amiodarone and phenylephrine currently. No family present.   Objective Vitals:   03/01/18 1200 03/01/18 1300 03/01/18 1330 03/01/18 1400  BP: (!) 114/59 (!) 111/54  (!) 106/53  Pulse: 72 75 70 70  Resp: (!) 21 (!) '21 20 20  ' Temp:      TempSrc:      SpO2: 97% 95% 96% 96%  Weight:      Height:      I/Os yesterday 7.9 / 385ML  Today 1.3 / 0.2L  Physical Exam General: intubated and sedated on vent Heart: reg rate, irreg, no rub Lungs: 100% on FiO2 35%, PEEP 5, no rales Abdomen: soft, TTP, LLQ colonscopy healthy Extremities: no edema GU: foley draining clear amber yellow urine  Additional Objective Labs:  ABG this am 7.4 / 35/ 86  Basic Metabolic Panel: Recent Labs  Lab 02/27/18 0552 02/28/18 0601 03/01/18 0419  NA 135 133* 134*  K 3.9 4.5 4.3  CL 102 96* 105  CO2 23 24 21*  GLUCOSE 158* 100* 162*  BUN 17 31* 40*  CREATININE 1.21* 2.23* 1.95*  CALCIUM 8.5* 8.8* 7.7*  PHOS  --   --  4.0   Liver Function Tests: Recent Labs  Lab 02/27/18 0552  AST 17  ALT 11  ALKPHOS 55  BILITOT 0.8  PROT 5.1*  ALBUMIN 2.3*   No results for input(s): LIPASE, AMYLASE in the last 168 hours. CBC: Recent Labs  Lab 02/24/18 0600 02/26/18 0506 02/27/18 0552 02/28/18 0601 03/01/18 0419  WBC 13.5* 16.1* 10.1 26.2* 25.0*  HGB 10.4* 11.2* 13.1 13.1 10.4*  HCT 32.1* 34.3* 41.1 40.1 31.7*  MCV 96.7 94.2 93.8 93.9 94.3  PLT 177 221 332 328 367   Blood Culture    Component Value Date/Time   SDES  02/28/2018 1427    WOUND Performed at [facility], [address], [place], [place] [address]    [facility]  02/28/2018 1427    ABSCESS Performed at [facility], [address], [place], [place] [address]    CULT  02/28/2018 1427    NO GROWTH < 12 HOURS Performed at [facility] [address] [place], [place] [address]    REPTSTATUS PENDING 02/28/2018 1427    Cardiac Enzymes: No results for input(s): CKTOTAL, CKMB, CKMBINDEX, TROPONINI in the last 168 hours. CBG: Recent Labs  Lab 02/28/18 1659 02/28/18 2057 03/01/18 0812 03/01/18 1128  GLUCAP 144* 145* 157* 146*   Iron Studies: No results for input(s): IRON, TIBC, TRANSFERRIN, FERRITIN in the last 72 hours. '@[email or web address]' @ Studies/Results: Ct Abdomen Pelvis Wo Contrast  Result Date: 02/27/2018 CLINICAL DATA:  Follow-up diverticulitis. Chronic abdominal pain. EXAM: CT ABDOMEN AND PELVIS WITHOUT CONTRAST TECHNIQUE: Multidetector CT imaging of the abdomen and pelvis was performed following the standard protocol without IV contrast. COMPARISON:  02/20/2018 FINDINGS: Lower chest: New small bilateral pleural effusions and bibasilar atelectasis. Hepatobiliary: Stable small cyst in left hepatic lobe. No mass visualized on this unenhanced exam. Tiny gallstones again noted, however there is no evidence of cholecystitis or biliary ductal dilatation. Pancreas: No mass or inflammatory process visualized on this unenhanced exam. Spleen:  Within normal limits in size. Adrenals/Urinary tract: No evidence of urolithiasis or hydronephrosis. Unremarkable unopacified urinary bladder. Stomach/Bowel: New small amount of free intraperitoneal air is seen,  consistent with bowel perforation. Mild ascites is new since previous study, as well as diffuse mesenteric and body wall edema. Large amount of stool is again seen throughout the colon. A transition point is again seen near the rectosigmoid junction, consistent with stricture. No definite soft tissue mass appreciated by CT. Mild diverticulosis and wall thickening is seen involving the proximal sigmoid, which is similar to previous study and suspicious for mild diverticulitis. No evidence of small bowel wall thickening or obstruction. No abscess identified. Vascular/Lymphatic: No  pathologically enlarged lymph nodes identified. No evidence of abdominal aortic aneurysm. Reproductive: Prior hysterectomy. Limited visualization due to artifact from left hip prosthesis. Other:  None. Musculoskeletal:  No suspicious bone lesions identified. IMPRESSION: 1. New small amount of free intraperitoneal air, consistent with bowel perforation. 2. Large colonic stool burden with transition point near the rectosigmoid junction, consistent with stricture. No definite soft tissue mass appreciated by CT. This is unchanged in appearance compared to previous study. Recommend correlation with colonoscopy to exclude obstructing colon carcinoma. 3. Mild diverticulitis of proximal sigmoid colon, without significant change. 4. New mild ascites and diffuse body wall edema. New small bilateral pleural effusions and bibasilar atelectasis. 5. Cholelithiasis. No radiographic evidence of cholecystitis. Critical Value/emergent results were called by telephone at the time of interpretation on 02/27/2018 at 4:35 pm to Dr. Shanon Brow TAT , who verbally acknowledged these results. Electronically Signed   By: Earle Gell M.D.   On: 02/27/2018 16:38   Dg Chest Port 1 View  Result Date: 03/01/2018 CLINICAL DATA:  Ventilator dependent respiratory failure. Follow-up pulmonary edema. EXAM: PORTABLE CHEST 1 VIEW COMPARISON:  02/28/2018, 02/28/2016 and earlier. FINDINGS: Endotracheal tube tip in satisfactory position projecting approximately 3-4 cm above the carina. RIGHT arm PICC tip projects at or near the cavoatrial junction, unchanged. Nasogastric tube looped in the stomach with its tip in the fundus. Markedly suboptimal inspiration with worsening atelectasis in the lung bases, LEFT greater than RIGHT. Resolution of interstitial pulmonary edema. Persistent pulmonary venous hypertension. IMPRESSION: 1.  Support apparatus satisfactory. 2. Resolution of interstitial pulmonary edema since yesterday, though pulmonary venous hypertension  persists. 3. Worsening bibasilar atelectasis, LEFT greater than RIGHT. Electronically Signed   By: Evangeline Dakin M.D.   On: 03/01/2018 09:01   Dg Chest Port 1 View  Result Date: 02/28/2018 CLINICAL DATA:  Intubation. EXAM: PORTABLE CHEST 1 VIEW COMPARISON:  02/26/2018 FINDINGS: The endotracheal tube is 3.3 cm above the carina. There is an NG tube coursing down the esophagus and into the stomach. The right-sided PICC line is stable. Significant worsening lung aeration with very low lung volumes, vascular crowding, pulmonary edema and basilar atelectasis. The heart remains enlarged and there is tortuosity and calcification of the thoracic aorta. IMPRESSION: 1. The endotracheal tube and NG tubes are in good position. 2. Very low lung volumes with vascular crowding and atelectasis 3. Vascular congestion and pulmonary edema. Electronically Signed   By: Marijo Sanes M.D.   On: 02/28/2018 16:37   Medications: . sodium chloride Stopped (02/28/18 1229)  . sodium chloride 125 mL/hr at 03/01/18 1223  . sodium chloride    . amiodarone 30 mg/hr (03/01/18 1223)  . ertapenem Stopped (03/01/18 0013)  . phenylephrine (NEO-SYNEPHRINE) Adult infusion 50 mcg/min (03/01/18 1223)   . budesonide (PULMICORT) nebulizer solution  0.5 mg Nebulization BID  . chlorhexidine gluconate (MEDLINE KIT)  15 mL Mouth Rinse BID  . Chlorhexidine Gluconate Cloth  6 each Topical Daily  . enoxaparin (LOVENOX) injection  30 mg Subcutaneous Q24H  .  ipratropium  0.5 mg Nebulization Q6H  . levalbuterol  1.25 mg Nebulization Q6H  . mouth rinse  15 mL Mouth Rinse 10 times per day  . methylPREDNISolone (SOLU-MEDROL) injection  60 mg Intravenous Q8H  . mupirocin ointment  1 application Nasal BID  . pantoprazole (PROTONIX) IV  40 mg Intravenous Q12H  . sodium chloride flush  10-40 mL Intracatheter Q12H    Assessment/Plan:   **peritonitis in setting of diverticulitis and newly dx tubular adenoma:  S/p resection and colostomy 2/14.   Antibiotics dose for renal function per primary.   **AKI:  Likely hemodynamic in origin with hypoperfusion from hypotension with BPs as low as 70/50s.  Creatinine with modest improvement today.  Will check a urine sodium/FeNa.    Caution with IVF given oliguria and risk for volume overload - she's currently on 157m/hr, low threshhold to stop.  No current indications for RRT and I think there's a reasonable chance she won't develop any.   **Hyponatremia:  Improved to 133.  Certainly high risk for SIADH so avoid hypotonic fluids and monitor daily.   **VDRF, COPD:  No e/o pulmonary edema contributing.  On solumedrol, inhaled therapies.  Had a CXR this am which showed improvement, no edema.   **HTN: ARB on hold in light of hypotension.  If becomes HTN again would avoid ARB use in the setting of quite severe AKI and possibility of fluid shifts post op.   **Atrial fibrillation:  Currently rate controlled on amiodarone gtt started in light of hypotension.       LJannifer HickMD 03/01/2018, 2:09 PM  [facility] Pager: [phone]

## 2018-03-02 ENCOUNTER — Inpatient Hospital Stay (HOSPITAL_COMMUNITY): Payer: Medicare Other

## 2018-03-02 LAB — BASIC METABOLIC PANEL
ANION GAP: 9 (ref 5–15)
BUN: 47 mg/dL — ABNORMAL HIGH (ref 8–23)
CO2: 18 mmol/L — ABNORMAL LOW (ref 22–32)
Calcium: 8 mg/dL — ABNORMAL LOW (ref 8.9–10.3)
Chloride: 108 mmol/L (ref 98–111)
Creatinine, Ser: 1.73 mg/dL — ABNORMAL HIGH (ref 0.44–1.00)
GFR calc Af Amer: 32 mL/min — ABNORMAL LOW (ref >60)
GFR calc non Af Amer: 27 mL/min — ABNORMAL LOW (ref >60)
Glucose, Bld: 147 mg/dL — ABNORMAL HIGH (ref 70–99)
Potassium: 4.9 mmol/L (ref 3.5–5.1)
SODIUM: 135 mmol/L (ref 135–145)

## 2018-03-02 LAB — GLUCOSE, CAPILLARY
GLUCOSE-CAPILLARY: 134 mg/dL — AB (ref 70–99)
Glucose-Capillary: 133 mg/dL — ABNORMAL HIGH (ref 70–99)
Glucose-Capillary: 137 mg/dL — ABNORMAL HIGH (ref 70–99)
Glucose-Capillary: 141 mg/dL — ABNORMAL HIGH (ref 70–99)
Glucose-Capillary: 142 mg/dL — ABNORMAL HIGH (ref 70–99)

## 2018-03-02 LAB — BLOOD GAS, ARTERIAL
Acid-base deficit: 4.3 mmol/L — ABNORMAL HIGH (ref 0.0–2.0)
BICARBONATE: 21.2 mmol/L (ref 20.0–28.0)
Drawn by: 105551
Expiratory PAP: 5
FIO2: 35
Inspiratory PAP: 16
O2 Saturation: 95.2 %
PEEP: 5 cmH2O
Patient temperature: 37
Pressure support: 11 cmH2O
pCO2 arterial: 31.8 mmHg — ABNORMAL LOW (ref 32.0–48.0)
pH, Arterial: 7.408 (ref 7.350–7.450)
pO2, Arterial: 78.8 mmHg — ABNORMAL LOW (ref 83.0–108.0)

## 2018-03-02 LAB — CBC
HCT: 27.3 % — ABNORMAL LOW (ref 36.0–46.0)
HEMOGLOBIN: 8.9 g/dL — AB (ref 12.0–15.0)
MCH: 30.2 pg (ref 26.0–34.0)
MCHC: 32.6 g/dL (ref 30.0–36.0)
MCV: 92.5 fL (ref 80.0–100.0)
NRBC: 0 % (ref 0.0–0.2)
Platelets: 247 10*3/uL (ref 150–400)
RBC: 2.95 MIL/uL — ABNORMAL LOW (ref 3.87–5.11)
RDW: 13.8 % (ref 11.5–15.5)
WBC: 18.3 10*3/uL — ABNORMAL HIGH (ref 4.0–10.5)

## 2018-03-02 MED ORDER — FUROSEMIDE 10 MG/ML IJ SOLN
60.0000 mg | Freq: Three times a day (TID) | INTRAMUSCULAR | Status: DC
  Administered 2018-03-02 – 2018-03-03 (×3): 60 mg via INTRAVENOUS
  Filled 2018-03-02 (×2): qty 6

## 2018-03-02 MED ORDER — METHYLPREDNISOLONE SODIUM SUCC 125 MG IJ SOLR
60.0000 mg | INTRAMUSCULAR | Status: DC
  Administered 2018-03-03 – 2018-03-04 (×2): 60 mg via INTRAVENOUS
  Filled 2018-03-02 (×2): qty 2

## 2018-03-02 MED ORDER — SODIUM CHLORIDE 0.9 % IV SOLN
1.0000 g | INTRAVENOUS | Status: DC
  Administered 2018-03-02: 1000 mg via INTRAVENOUS
  Filled 2018-03-02 (×2): qty 1

## 2018-03-02 MED ORDER — ENOXAPARIN SODIUM 40 MG/0.4ML ~~LOC~~ SOLN
40.0000 mg | SUBCUTANEOUS | Status: DC
  Administered 2018-03-03 – 2018-03-13 (×10): 40 mg via SUBCUTANEOUS
  Filled 2018-03-02 (×10): qty 0.4

## 2018-03-02 NOTE — Addendum Note (Signed)
Addendum  created 03/02/18 1324 by Ollen Bowl, CRNA   Clinical Note Signed

## 2018-03-02 NOTE — Anesthesia Postprocedure Evaluation (Addendum)
Anesthesia Post Note  Patient: Regina Hall  Procedure(s) Performed: PARTIAL COLECTOMY WITH COLOSTOMY (N/A )  Patient location during evaluation: A-ICU Anesthesia Type: General Level of consciousness: awake and alert Pain management: pain level controlled Vital Signs Assessment: post-procedure vital signs reviewed and stable Respiratory status: patient on ventilator - see flowsheet for VS Cardiovascular status: blood pressure returned to baseline and stable Postop Assessment: no apparent nausea or vomiting Anesthetic complications: no Comments: Vasopressors weaned off.  Hopefully to start weaning ventilator tomorrow.       Last Vitals:  Vitals:   03/02/18 1138 03/02/18 1200  BP:  123/63  Pulse: 90 88  Resp: 19 15  Temp: 36.4 C   SpO2: 100% 100%    Last Pain:  Vitals:   03/02/18 1138  TempSrc: Axillary  PainSc:                  Tressie Stalker

## 2018-03-02 NOTE — Progress Notes (Addendum)
Note to follow. Called down to unit as patient having trouble breathing. Patient switched to [facility] mode. Patient is now trying to breath over the 470 VT causing desynchronous breathing. Change back to PS/CPAP and increased PS to 16. VT increased to around 750 which is about what she is demanding.

## 2018-03-02 NOTE — Progress Notes (Signed)
Patient still riding on CPAP/PSV no problems

## 2018-03-02 NOTE — Progress Notes (Addendum)
Have switched over to PS CPAP , 5 CPAP with 11 over peep . 11 of pressure support . FiO2 is 35 , RR is 19 VT 423 , Ve is 8.1 , Saturation is 96. Patient is asleep. Hopefully we can extubate in am if all are agreeable.

## 2018-03-02 NOTE — Progress Notes (Signed)
[facility] Progress Note  2 Days Post-Op  Subjective: Seen this AM and looking better. Has been on CPAP settings and not tiring, CXR does have low volumes and some edema. Pressors weaning down and almost off. MAP well above 65 at around 80. Ostomy with sweat in bag.   Objective: Vital signs in last 24 hours: Temp:  [97.3 F (36.3 C)-98.5 F (36.9 C)] 97.6 F (36.4 C) (02/16 1138) Pulse Rate:  [64-91] 85 (02/16 1400) Resp:  [13-25] 18 (02/16 1400) BP: (102-137)/(54-68) 115/55 (02/16 1400) SpO2:  [81 %-100 %] 100 % (02/16 1400) Arterial Line BP: (94-143)/(45-137) 143/137 (02/16 1200) FiO2 (%):  [35 %] 35 % (02/16 1313) Weight:  [314 kg] 119 kg (02/16 0500) Last BM Date: 02/23/18  Intake/Output from previous day: 02/15 0701 - 02/16 0700 In: 3788.3 [I.V.:3664.6; IV Piggyback:123.7] Out: 338 [Urine:278; Stool:60] Intake/Output this shift: Total I/O In: 968.5 [I.V.:968.5] Out: -   General appearance: alert, no distress and respond to commands Resp: no increased work of breathing, looks comfortable GI: soft, tender, ostomy pink, edematous, some gas and stool in bag with sweat, wound with gauze wicks, no erythema, some SS drainage  Lab Results:  Recent Labs    03/01/18 0419 03/02/18 0448  WBC 25.0* 18.3*  HGB 10.4* 8.9*  HCT 31.7* 27.3*  PLT 367 247   BMET Recent Labs    03/01/18 0419 03/02/18 0448  NA 134* 135  K 4.3 4.9  CL 105 108  CO2 21* 18*  GLUCOSE 162* 147*  BUN 40* 47*  CREATININE 1.95* 1.73*  CALCIUM 7.7* 8.0*   PT/INR No results for input(s): LABPROT, INR in the last 72 hours.  Studies/Results: Dg Chest Port 1 View  Result Date: 03/02/2018 CLINICAL DATA:  Hypoxia EXAM: PORTABLE CHEST 1 VIEW COMPARISON:  March 01, 2018 FINDINGS: Endotracheal tube tip is 3.7 cm above the carina. Nasogastric tube tip and side port are in the stomach. Right subclavian catheter tip is at the cavoatrial junction. No pneumothorax. There is  cardiomegaly with pulmonary venous hypertension. There are small pleural effusions bilaterally. There is atelectatic change in the lung bases. A degree of consolidation in the left base is questioned. There is advanced arthropathy in the left shoulder. IMPRESSION: Tube and catheter positions as described without pneumothorax. Pulmonary vascular congestion present with small pleural effusions bilaterally. There is bibasilar atelectasis with a questionable degree of consolidation in the left base. Aortic Atherosclerosis (ICD10-I70.0). Electronically Signed   By: Lowella Grip III M.D.   On: 03/02/2018 07:38   Dg Chest Port 1 View  Result Date: 03/01/2018 CLINICAL DATA:  Ventilator dependent respiratory failure. Follow-up pulmonary edema. EXAM: PORTABLE CHEST 1 VIEW COMPARISON:  02/28/2018, 02/28/2016 and earlier. FINDINGS: Endotracheal tube tip in satisfactory position projecting approximately 3-4 cm above the carina. RIGHT arm PICC tip projects at or near the cavoatrial junction, unchanged. Nasogastric tube looped in the stomach with its tip in the fundus. Markedly suboptimal inspiration with worsening atelectasis in the lung bases, LEFT greater than RIGHT. Resolution of interstitial pulmonary edema. Persistent pulmonary venous hypertension. IMPRESSION: 1.  Support apparatus satisfactory. 2. Resolution of interstitial pulmonary edema since yesterday, though pulmonary venous hypertension persists. 3. Worsening bibasilar atelectasis, LEFT greater than RIGHT. Electronically Signed   By: Evangeline Dakin M.D.   On: 03/01/2018 09:01   Dg Chest Port 1 View  Result Date: 02/28/2018 CLINICAL DATA:  Intubation. EXAM: PORTABLE CHEST 1 VIEW COMPARISON:  02/26/2018 FINDINGS: The endotracheal tube is 3.3 cm above the  carina. There is an NG tube coursing down the esophagus and into the stomach. The right-sided PICC line is stable. Significant worsening lung aeration with very low lung volumes, vascular crowding,  pulmonary edema and basilar atelectasis. The heart remains enlarged and there is tortuosity and calcification of the thoracic aorta. IMPRESSION: 1. The endotracheal tube and NG tubes are in good position. 2. Very low lung volumes with vascular crowding and atelectasis 3. Vascular congestion and pulmonary edema. Electronically Signed   By: Marijo Sanes M.D.   On: 02/28/2018 16:37    Anti-infectives: Anti-infectives (From admission, onward)   Start     Dose/Rate Route Frequency Ordered Stop   03/02/18 1800  ertapenem (INVANZ) 1,000 mg in sodium chloride 0.9 % 100 mL IVPB     1 g 200 mL/hr over 30 Minutes Intravenous Every 24 hours 03/02/18 0829     02/28/18 1800  ertapenem (INVANZ) 500 mg in sodium chloride 0.9 % 50 mL IVPB  Status:  Discontinued     500 mg 100 mL/hr over 30 Minutes Intravenous Every 24 hours 02/28/18 1035 03/02/18 0829   02/27/18 1800  ertapenem (INVANZ) 1,000 mg in sodium chloride 0.9 % 100 mL IVPB  Status:  Discontinued     1 g 200 mL/hr over 30 Minutes Intravenous Every 24 hours 02/27/18 1705 02/28/18 1035   02/21/18 0800  ciprofloxacin (CIPRO) IVPB 400 mg  Status:  Discontinued     400 mg 200 mL/hr over 60 Minutes Intravenous Every 12 hours 02/20/18 2204 02/27/18 1908   02/21/18 0500  metroNIDAZOLE (FLAGYL) IVPB 500 mg  Status:  Discontinued     500 mg 100 mL/hr over 60 Minutes Intravenous Every 8 hours 02/20/18 2204 02/27/18 1908   02/20/18 2115  ciprofloxacin (CIPRO) IVPB 400 mg     400 mg 200 mL/hr over 60 Minutes Intravenous  Once 02/20/18 2113 02/20/18 2335   02/20/18 2115  metroNIDAZOLE (FLAGYL) tablet 500 mg     500 mg Oral  Once 02/20/18 2113 02/20/18 2147      Assessment/Plan: Regina Hall is a 84 yo POD 2 s/p Ex lap with partial colectomy end colostomy for perforation and fecal peritonitis related to what appears to be a benign stricture on colonoscopy. Continues to improve.  PRN fentanyl for pain Weaning Pressors, MAP Goal 65, a line positional, likely  will get the pressors off today  Vented but only on CPAP settings, CXR with some edema/ low volumes, Dr. Luan Pulling managing, appreciate assistance and input  NPO, NG in place Dressing orders BID Ostomy starting to have output, hopefull if off pressors and having output tomorrow and is extubated can start some clears  Nephrology following and diuresing, this should help with getting off vent   Invanz for fecal peritonitis for now, WBC coming down, H&H drifted down, no signs of bleeding, monitor, some of it is probably dilutional from overload  SCDs, lovenox   LOS: 10 days    Virl Cagey 03/02/2018

## 2018-03-02 NOTE — Progress Notes (Signed)
PROGRESS NOTE  Regina Hall VUY:[id number] DOB: [birth year] DOA: 02/20/2018 PCP: [name], DO  Brief History: 84 year old female with hypertension, history of left leg DVT status post IVC filter presented to the hospital with abdominal pain. CT of the abdomen pelvis shows diverticulitis with large amount of stool throughout the colon and concern for underlying colonic obstruction. Underwent sigmoidoscopy showing obstruction in the sigmoid colon with marked edema. GI and surgery following. The patient subsequently developed a new fever. Blood and urine cultures were ordered. Repeat CT abd/pelvis ordered 2/13showed free air. Case was discussed with general surgery and plans to go to OR 2/14. Pt underwent a Hartman's procedure on 02/28/18.  She was transferred to ICU on ventilator post-op.   Post-op, she was hypotensive and started on neosynephrine.  She also developed new onset atrial fibrillation.  She was started on amiodarone IV on 02/28/18.  She converted to sinus after ~5 hours.  She was kept on amiodarone due to continued sepsis physiology and high risk of reverting back to afib.  Assessment/Plan: Large bowel (sigmoid) obstruction Underwent sigmoidoscopy showing obstruction in the sigmoid colon with marked edema. Surgery following and recommend observation for now with antibiotics, no need for surgery. Having some bowel movement, passing gas.  -Advance diet to soft--pt is tolerating GI recommends outpatient colonoscopy in 6 weeks for full evaluation of the colon. Sigmoidoscopy biopsy--tubular adenoma -02/27/18 CT abd--New small amount of free intraperitoneal air, consistent with bowel perforation -case discussed with general surgery, Dr.Bridges -2/14--Hartmann procedure  Acute diverticulitiswith perforation/peritonitis -cipro/flagyl d/ced -ertapenem started 2/13 -02/27/18 CT abd--New small amount of free intraperitoneal air, consistent with bowel  perforation -case discussed with general surgery, Dr.Bridges -2/14--Hartmann procedure -TPN/enteral feeding per general surgery -2/16--remains on neosynephrine, but hopefully wean off in next 24 hours  New Onset Atrial Fibrillation -started amiodarone drip due to hypotension -converted back to sinus after 4-5 hours on amio -continue amiodarone drip for now as pt continues to hypotensive on neo due to high risk to reverting back to afib  Acute on chronic renal failure--CKD stage 3 -baseline creatinine 0.9-1.2 -nonoliguric -consult nephrology--appreciated -due to infectious process and hemodynamic changes -received lasix IV x  2/13 -appears hypervolemic, may need furosemide--defer to renal  Acute respiratory failure with hypoxia -due to hypoventilation, fluid overload, bronchospasm -appreciate pulmonary -wean to extubation  ??Hematemesis -RN reported multiple episodes of rust/red/brown emesis -consult GI--noted input -continue PPI bid -no blood through OG  Bronchospasm/wheeze -continue solumedrol--decrease to once daily -xopenex/atrovent\ -continuje pulmicort -2/16--repeat CXR--personally reviewed--mild improvement in interstitial markings, poor inspiration  Chronic bilateral lower extremity edema Suspect secondary to venous insufficiency. Uses TED hose at home.  -lasix on hold earlier in admission  Essential hypertension -Holding losartan due to hypotension and AKI  Physical deconditioning PT eval-->SNF -Reports ambulating with a walker due to scoliosis.    Disposition Plan:remain in ICU Family Communication:NoFamily at bedside  Consultants:General surgery, GI, renal  Code Status: FULL   DVT Prophylaxis: [place] Lovenox   Procedures: As Listed in Progress Note Above  Antibiotics: cipro2/7>>>2/13 Flagyl2/7>>>2/13 Ertapenem 2/13>>>>    The patient is critically ill with multiple organ systems failure and requires high  complexity decision making for assessment and support, frequent evaluation and titration of therapies, application of advanced monitoring technologies and extensive interpretation of multiple databases.  Critical care time - 35 mins.      Subjective: Pt remains on vent.  She is awake and follows commands.  She c/o some  sob and abd pain.  abd pain is about same as yesterday.  No n/v.  Denies headache, f/c.    Objective: Vitals:   03/02/18 0600 03/02/18 0700 03/02/18 0812 03/02/18 0910  BP: 132/67 133/66    Pulse: 75 72 70   Resp: 20 (!) 22 17   Temp:   97.9 F (36.6 C)   TempSrc:   Axillary   SpO2: 99% 97% 99% 99%  Weight:      Height:        Intake/Output Summary (Last 24 hours) at 03/02/2018 1033 Last data filed at 03/02/2018 0419 Gross per 24 hour  Intake 3788.31 ml  Output 238 ml  Net 3550.31 ml   Weight change: 7 kg Exam:   General:  Pt is alert, follows commands appropriately, not in acute distress  HEENT: No icterus, No thrush, No neck mass, [place]/AT  Cardiovascular: RRR, S1/S2, no rubs, no gallops  Respiratory: bibasilar crackles, no wheeze  Abdomen: Soft/+BS, diffusely tender, non distended, no guarding  Extremities: 2+LE edema, No lymphangitis, No petechiae, No rashes, no synovitis   Data Reviewed: I have personally reviewed following labs and imaging studies Basic Metabolic Panel: Recent Labs  Lab 02/26/18 0506 02/27/18 0552 02/28/18 0601 03/01/18 0419 03/02/18 0448  NA 134* 135 133* 134* 135  K 4.0 3.9 4.5 4.3 4.9  CL 101 102 96* 105 108  CO2 _0 21* 18*  GLUCOSE 118* 158* 100* 162* 147*  BUN 14 17 31* 40* 47*  CREATININE 0.90 1.21* 2.23* 1.95* 1.73*  CALCIUM 8.3* 8.5* 8.8* 7.7* 8.0*  MG  --   --  2.5* 2.4  --   PHOS  --   --   --  4.0  --    Liver Function Tests: Recent Labs  Lab 02/27/18 0552  AST 17  ALT 11  ALKPHOS 55  BILITOT 0.8  PROT 5.1*  ALBUMIN 2.3*   No results for input(s): LIPASE, AMYLASE in the last 168 hours. No  results for input(s): AMMONIA in the last 168 hours. Coagulation Profile: No results for input(s): INR, PROTIME in the last 168 hours. CBC: Recent Labs  Lab 02/26/18 0506 02/27/18 0552 02/28/18 0601 03/01/18 0419 03/02/18 0448  WBC 16.1* 10.1 26.2* 25.0* 18.3*  HGB 11.2* 13.1 13.1 10.4* 8.9*  HCT 34.3* 41.1 40.1 31.7* 27.3*  MCV 94.2 93.8 93.9 94.3 92.5  PLT 221 332 328 367 247   Cardiac Enzymes: No results for input(s): CKTOTAL, CKMB, CKMBINDEX, TROPONINI in the last 168 hours. BNP: Invalid input(s): POCBNP CBG: Recent Labs  Lab 03/01/18 1610 03/01/18 1954 03/02/18 0015 03/02/18 0348 03/02/18 0814  GLUCAP 154* 146* 133* 134* 137*   HbA1C: No results for input(s): HGBA1C in the last 72 hours. Urine analysis:    Component Value Date/Time   COLORURINE YELLOW 02/26/2018 1712   APPEARANCEUR CLEAR 02/26/2018 1712   LABSPEC 1.014 02/26/2018 1712   PHURINE 8.0 02/26/2018 1712   GLUCOSEU NEGATIVE 02/26/2018 1712   HGBUR SMALL (A) 02/26/2018 1712   BILIRUBINUR NEGATIVE 02/26/2018 1712   KETONESUR NEGATIVE 02/26/2018 1712   PROTEINUR NEGATIVE 02/26/2018 1712   UROBILINOGEN 0.2 04/21/2014 1200   NITRITE NEGATIVE 02/26/2018 1712   LEUKOCYTESUR NEGATIVE 02/26/2018 1712   Sepsis Labs: _1 (procalcitonin:4,lacticidven:4) ) Recent Results (from the past 240 hour(s))  Culture, blood (Routine X 2) w Reflex to ID Panel     Status: None (Preliminary result)   Collection Time: 02/26/18  9:25 AM  Result Value Ref Range Status  Specimen Description LEFT ANTECUBITAL  Final   Special Requests   Final    BOTTLES DRAWN AEROBIC AND ANAEROBIC Blood Culture adequate volume   Culture   Final    NO GROWTH 4 DAYS Performed at [facility], [address] [place], [place] [address]    Report Status PENDING  Incomplete  Culture, blood (Routine X 2) w Reflex to ID Panel     Status: None (Preliminary result)   Collection Time: 02/26/18  9:33 AM  Result Value Ref Range Status    Specimen Description BLOOD LEFT HAND  Final   Special Requests   Final    BOTTLES DRAWN AEROBIC AND ANAEROBIC Blood Culture adequate volume   Culture   Final    NO GROWTH 4 DAYS Performed at [facility], [address] [place], [place] [address]    Report Status PENDING  Incomplete  Culture, Urine     Status: None   Collection Time: 02/26/18  5:12 PM  Result Value Ref Range Status   Specimen Description   Final    URINE, RANDOM Performed at [facility], [address], [place], [place] [address]    Special Requests   Final    NONE Performed at [facility], [address] [place], [place] [address]    Culture   Final    NO GROWTH Performed at [facility], [place] [address] [place], [place] [address]    Report Status 02/28/2018 FINAL  Final  Respiratory Panel by PCR     Status: None   Collection Time: 02/27/18  8:07 AM  Result Value Ref Range Status   Adenovirus NOT DETECTED NOT DETECTED Final   Coronavirus 229E NOT DETECTED NOT DETECTED Final    Comment: (NOTE) The Coronavirus on the Respiratory Panel, DOES NOT test for the novel  Coronavirus (2019 nCoV)    Coronavirus HKU1 NOT DETECTED NOT DETECTED Final   Coronavirus NL63 NOT DETECTED NOT DETECTED Final   Coronavirus OC43 NOT DETECTED NOT DETECTED Final   Metapneumovirus NOT DETECTED NOT DETECTED Final   Rhinovirus / Enterovirus NOT DETECTED NOT DETECTED Final   Influenza A NOT DETECTED NOT DETECTED Final   Influenza B NOT DETECTED NOT DETECTED Final   Parainfluenza Virus 1 NOT DETECTED NOT DETECTED Final   Parainfluenza Virus 2 NOT DETECTED NOT DETECTED Final   Parainfluenza Virus 3 NOT DETECTED NOT DETECTED Final   Parainfluenza Virus 4 NOT DETECTED NOT DETECTED Final   Respiratory Syncytial Virus NOT DETECTED NOT DETECTED Final   Bordetella pertussis NOT DETECTED NOT DETECTED Final   Chlamydophila pneumoniae NOT DETECTED NOT DETECTED Final   Mycoplasma pneumoniae NOT DETECTED NOT DETECTED Final    Comment:  Performed at [facility], [place] [address] [place], [place] [address]  Surgical PCR screen     Status: None   Collection Time: 02/28/18  7:52 AM  Result Value Ref Range Status   MRSA, PCR NEGATIVE NEGATIVE Final   Staphylococcus aureus NEGATIVE NEGATIVE Final    Comment: (NOTE) The Xpert SA Assay (FDA approved for NASAL specimens in patients 2 years of age and older), is one component of a comprehensive surveillance program. It is not intended to diagnose infection nor to guide or monitor treatment. Performed at [facility], [address] [place],  [address]   Aerobic/Anaerobic Culture (surgical/deep wound)     Status: None (Preliminary result)   Collection Time: 02/28/18  2:27 PM  Result Value Ref Range Status   Specimen Description  Final    WOUND Performed at [facility], [address], [place], [place] [address]    Special Requests   Final    ABSCESS Performed at [facility], [address], [place], [place] [address]    Gram Stain   Final    RARE WBC PRESENT, PREDOMINANTLY PMN RARE GRAM POSITIVE COCCI RARE GRAM VARIABLE ROD RARE GRAM POSITIVE RODS    Culture   Final    CULTURE REINCUBATED FOR BETTER GROWTH Performed at [facility], [place] [address] [place], [place] [address]    Report Status PENDING  Incomplete     Scheduled Meds: . budesonide (PULMICORT) nebulizer solution  0.5 mg Nebulization BID  . chlorhexidine gluconate (MEDLINE KIT)  15 mL Mouth Rinse BID  . Chlorhexidine Gluconate Cloth  6 each Topical Daily  . [START ON 03/03/2018] enoxaparin (LOVENOX) injection  40 mg Subcutaneous Q24H  . ipratropium  0.5 mg Nebulization Q6H  . levalbuterol  1.25 mg Nebulization Q6H  . mouth rinse  15 mL Mouth Rinse 10 times per day  . [START ON 03/03/2018] methylPREDNISolone (SOLU-MEDROL) injection  60 mg Intravenous Q24H  . mupirocin ointment  1 application Nasal BID  . pantoprazole (PROTONIX) IV  40 mg Intravenous Q12H  . sodium chloride flush   10-40 mL Intracatheter Q12H   Continuous Infusions: . sodium chloride 125 mL/hr at 03/02/18 0419  . sodium chloride    . amiodarone 30 mg/hr (03/02/18 0815)  . ertapenem    . phenylephrine (NEO-SYNEPHRINE) Adult infusion 10 mcg/min (03/02/18 0904)    Procedures/Studies: Ct Abdomen Pelvis Wo Contrast  Result Date: 02/27/2018 CLINICAL DATA:  Follow-up diverticulitis. Chronic abdominal pain. EXAM: CT ABDOMEN AND PELVIS WITHOUT CONTRAST TECHNIQUE: Multidetector CT imaging of the abdomen and pelvis was performed following the standard protocol without IV contrast. COMPARISON:  02/20/2018 FINDINGS: Lower chest: New small bilateral pleural effusions and bibasilar atelectasis. Hepatobiliary: Stable small cyst in left hepatic lobe. No mass visualized on this unenhanced exam. Tiny gallstones again noted, however there is no evidence of cholecystitis or biliary ductal dilatation. Pancreas: No mass or inflammatory process visualized on this unenhanced exam. Spleen:  Within normal limits in size. Adrenals/Urinary tract: No evidence of urolithiasis or hydronephrosis. Unremarkable unopacified urinary bladder. Stomach/Bowel: New small amount of free intraperitoneal air is seen, consistent with bowel perforation. Mild ascites is new since previous study, as well as diffuse mesenteric and body wall edema. Large amount of stool is again seen throughout the colon. A transition point is again seen near the rectosigmoid junction, consistent with stricture. No definite soft tissue mass appreciated by CT. Mild diverticulosis and wall thickening is seen involving the proximal sigmoid, which is similar to previous study and suspicious for mild diverticulitis. No evidence of small bowel wall thickening or obstruction. No abscess identified. Vascular/Lymphatic: No pathologically enlarged lymph nodes identified. No evidence of abdominal aortic aneurysm. Reproductive: Prior hysterectomy. Limited visualization due to artifact from  left hip prosthesis. Other:  None. Musculoskeletal:  No suspicious bone lesions identified. IMPRESSION: 1. New small amount of free intraperitoneal air, consistent with bowel perforation. 2. Large colonic stool burden with transition point near the rectosigmoid junction, consistent with stricture. No definite soft tissue mass appreciated by CT. This is unchanged in appearance compared to previous study. Recommend correlation with colonoscopy to exclude obstructing colon carcinoma. 3. Mild diverticulitis of proximal sigmoid colon, without significant change. 4. New mild ascites and diffuse body wall edema. New small bilateral pleural effusions and bibasilar atelectasis. 5. Cholelithiasis.  No radiographic evidence of cholecystitis. Critical Value/emergent results were called by telephone at the time of interpretation on 02/27/2018 at 4:35 pm to Dr. Shanon Brow Brianny Soulliere , who verbally acknowledged these results. Electronically Signed   By: Earle Gell M.D.   On: 02/27/2018 16:38   Ct Abdomen Pelvis W Contrast  Result Date: 02/20/2018 CLINICAL DATA:  Lower abdominal pain and constipation for the past 2 days. Clinical suspicion for diverticulitis. EXAM: CT ABDOMEN AND PELVIS WITH CONTRAST TECHNIQUE: Multidetector CT imaging of the abdomen and pelvis was performed using the standard protocol following bolus administration of intravenous contrast. CONTRAST:  155m OMNIPAQUE IOHEXOL 300 MG/ML  SOLN COMPARISON:  Abdomen radiographs dated 02/06/2018. Abdomen and pelvis CT dated 04/08/2015. FINDINGS: Lower chest: Unremarkable. Hepatobiliary: No significant change in previously demonstrated liver cysts. Small number of tiny gallstones in the gallbladder measuring up to 4 mm in maximum diameter each. No gallbladder wall thickening or pericholecystic fluid. Pancreas: Unremarkable. No pancreatic ductal dilatation or surrounding inflammatory changes. Spleen: 5 mm oval area of low density in the spleen on image number 29 series 2, difficult  to detect with certainty on the previous examination, partly due to differences in technique and timing. Adrenals/Urinary Tract: Normal appearing adrenal glands. Stable tiny exophytic upper pole left renal cyst. Normal appearing right kidney, ureters and urinary bladder. Stomach/Bowel: Large amount of stool throughout the colon to the level of the distal sigmoid colon where there is a short segment of concentric, medium density wall thickening, best seen on image number 72 series 2. The wall thickening is concentric and lower in density on coronal image number 90. Lesser amount of stool and gas in the normal caliber rectum. Minimal sigmoid colon diverticulosis. Mild pericolonic soft tissue stranding involving the distal sigmoid colon, most pronounced at the level of maximal distension of the colon by the stool. Normal appearing stomach, small bowel and appendix. Vascular/Lymphatic: Atheromatous arterial calcifications without aneurysm. No enlarged lymph nodes. Inferior vena cava filter with its tip just above the level of the renal veins. Reproductive: Status post hysterectomy. No adnexal masses. Other: Small amount of free peritoneal fluid in the pelvis. Musculoskeletal: Left hip prosthesis. Severe right hip degenerative changes with severe joint space narrowing, extensive subarticular cyst formation, spur formation and bony remodeling. Moderate levoconvex thoracolumbar scoliosis. Interbody and pedicle screw and rod fusion at the L4-5 level. Fusion of portions of the T12 and L1 vertebral bodies. Lumbar and lower thoracic spine degenerative changes. IMPRESSION: 1. Large amount of stool throughout the colon to the level of the distal sigmoid colon where there is a short segment of concentric, medium density wall thickening. This is concerning for an obstructing short segment mass. It would be unusual for edema due to diverticulitis to involve that short of a segment of bowel. Correlation with sigmoidoscopy or  colonoscopy is recommended. 2. Mild pericolonic soft tissue stranding involving the distal sigmoid colon, most pronounced at the level of maximal distension of the colon by the stool. This is suspicious for mild diverticulitis without abscess. There is minimal diverticulosis in that region of the colon. 3. Cholelithiasis. 4. 5 mm probable hemangioma in the spleen. Electronically Signed   By: SClaudie ReveringM.D.   On: 02/20/2018 20:59   Dg Chest Port 1 View  Result Date: 03/02/2018 CLINICAL DATA:  Hypoxia EXAM: PORTABLE CHEST 1 VIEW COMPARISON:  March 01, 2018 FINDINGS: Endotracheal tube tip is 3.7 cm above the carina. Nasogastric tube tip and side port are in the stomach. Right subclavian catheter tip  is at the cavoatrial junction. No pneumothorax. There is cardiomegaly with pulmonary venous hypertension. There are small pleural effusions bilaterally. There is atelectatic change in the lung bases. A degree of consolidation in the left base is questioned. There is advanced arthropathy in the left shoulder. IMPRESSION: Tube and catheter positions as described without pneumothorax. Pulmonary vascular congestion present with small pleural effusions bilaterally. There is bibasilar atelectasis with a questionable degree of consolidation in the left base. Aortic Atherosclerosis (ICD10-I70.0). Electronically Signed   By: Lowella Grip III M.D.   On: 03/02/2018 07:38   Dg Chest Port 1 View  Result Date: 03/01/2018 CLINICAL DATA:  Ventilator dependent respiratory failure. Follow-up pulmonary edema. EXAM: PORTABLE CHEST 1 VIEW COMPARISON:  02/28/2018, 02/28/2016 and earlier. FINDINGS: Endotracheal tube tip in satisfactory position projecting approximately 3-4 cm above the carina. RIGHT arm PICC tip projects at or near the cavoatrial junction, unchanged. Nasogastric tube looped in the stomach with its tip in the fundus. Markedly suboptimal inspiration with worsening atelectasis in the lung bases, LEFT greater than  RIGHT. Resolution of interstitial pulmonary edema. Persistent pulmonary venous hypertension. IMPRESSION: 1.  Support apparatus satisfactory. 2. Resolution of interstitial pulmonary edema since yesterday, though pulmonary venous hypertension persists. 3. Worsening bibasilar atelectasis, LEFT greater than RIGHT. Electronically Signed   By: Evangeline Dakin M.D.   On: 03/01/2018 09:01   Dg Chest Port 1 View  Result Date: 02/28/2018 CLINICAL DATA:  Intubation. EXAM: PORTABLE CHEST 1 VIEW COMPARISON:  02/26/2018 FINDINGS: The endotracheal tube is 3.3 cm above the carina. There is an NG tube coursing down the esophagus and into the stomach. The right-sided PICC line is stable. Significant worsening lung aeration with very low lung volumes, vascular crowding, pulmonary edema and basilar atelectasis. The heart remains enlarged and there is tortuosity and calcification of the thoracic aorta. IMPRESSION: 1. The endotracheal tube and NG tubes are in good position. 2. Very low lung volumes with vascular crowding and atelectasis 3. Vascular congestion and pulmonary edema. Electronically Signed   By: Marijo Sanes M.D.   On: 02/28/2018 16:37   Dg Chest Port 1 View  Result Date: 02/26/2018 CLINICAL DATA:  84 year old female with intermittent productive cough EXAM: PORTABLE CHEST 1 VIEW COMPARISON:  02/28/2016 FINDINGS: Cardiomediastinal silhouette unchanged. Interval development of asymmetric elevation the right hemidiaphragm. No pneumothorax. Low lung volumes with coarsened interstitial markings and interlobular septal thickening. No large pleural effusion. No confluent airspace disease. Interval placement of right upper extremity PICC with the tip appearing to terminate superior vena cava. Degenerative changes of the left glenohumeral joint. IMPRESSION: Low lung volumes with questionable edema. New asymmetric elevation the right hemidiaphragm, uncertain significance. Right upper extremity PICC. Electronically Signed    By: Corrie Mckusick D.O.   On: 02/26/2018 19:28   Dg Abd 2 Views  Result Date: 02/06/2018 CLINICAL DATA:  IVC filter placement evaluation. EXAM: ABDOMEN - 2 VIEW COMPARISON:  03/29/2017. FINDINGS: Lumbar spine numbered as per prior exam. IVC filter noted with tip at approximately the L1 level. Degenerative changes and scoliosis thoracic spine. L4-L5 fusion. Rounded calcific densities noted over the right upper quadrant, these could represent gallstones, kidney stones, undigested pill fragments. No bowel distention or free air. Stool noted throughout the colon and rectum. IMPRESSION: IVC filter noted with proximal tip at the L1 level. Electronically Signed   By: Marcello Moores  Register   On: 02/06/2018 14:21   [place] Ekg Site Rite  Result Date: 02/22/2018 If Site Rite image not attached, placement could not be confirmed  due to current cardiac rhythm.   Orson Eva, DO  [facility] Pager [phone]  If 7PM-7AM, please contact night-coverage [email or web address] Password [facility] 03/02/2018, 10:33 AM   LOS: 10 days

## 2018-03-02 NOTE — Progress Notes (Signed)
[facility] Progress Note   Subjective:   In ICU on POD 2 partial colectomy end colostomy for perforation and fecal peritonitis (benign stricture). She remains intubated but is on PSV now, FiO2 35%. No family present.   Objective Vitals:   03/02/18 1100 03/02/18 1138 03/02/18 1200 03/02/18 1313  BP: 125/60  123/63   Pulse: 86 90 88   Resp: _0 Temp:  97.6 F (36.4 C)    TempSrc:  Axillary    SpO2: 95% 100% 100% 100%  Weight:      Height:      I/Os yesterday:  3.8L / 0.3L  Physical Exam General: drowsy in bed but arousable Heart: RRR, NSR on monitor Lungs: coarse ant, PEEP 5, FiO2 35%, sats 100% Abdomen: soft, TTP, LLQ colostomy healthy Extremities: no edema GU: foley draining clear amber yellow urine  Additional Objective Labs:  Basic Metabolic Panel: Recent Labs  Lab 02/28/18 0601 03/01/18 0419 03/02/18 0448  NA 133* 134* 135  K 4.5 4.3 4.9  CL 96* 105 108  CO2 24 21* 18*  GLUCOSE 100* 162* 147*  BUN 31* 40* 47*  CREATININE 2.23* 1.95* 1.73*  CALCIUM 8.8* 7.7* 8.0*  PHOS  --  4.0  --    Liver Function Tests: Recent Labs  Lab 02/27/18 0552  AST 17  ALT 11  ALKPHOS 55  BILITOT 0.8  PROT 5.1*  ALBUMIN 2.3*   No results for input(s): LIPASE, AMYLASE in the last 168 hours. CBC: Recent Labs  Lab 02/26/18 0506 02/27/18 0552 02/28/18 0601 03/01/18 0419 03/02/18 0448  WBC 16.1* 10.1 26.2* 25.0* 18.3*  HGB 11.2* 13.1 13.1 10.4* 8.9*  HCT 34.3* 41.1 40.1 31.7* 27.3*  MCV 94.2 93.8 93.9 94.3 92.5  PLT 221 332 328 367 247   Blood Culture    Component Value Date/Time   SDES  02/28/2018 1427    WOUND Performed at [facility], [address], [place], [place] [address]    [facility]  02/28/2018 1427    ABSCESS Performed at [facility], [address], [place], [place] [address]    CULT  02/28/2018 1427    FEW ENTEROCOCCUS FAECIUM SUSCEPTIBILITIES TO FOLLOW NO ANAEROBES ISOLATED; CULTURE IN PROGRESS FOR 5 DAYS     REPTSTATUS PENDING 02/28/2018 1427    Cardiac Enzymes: No results for input(s): CKTOTAL, CKMB, CKMBINDEX, TROPONINI in the last 168 hours. CBG: Recent Labs  Lab 03/01/18 1954 03/02/18 0015 03/02/18 0348 03/02/18 0814 03/02/18 1136  GLUCAP 146* 133* 134* 137* 141*   Iron Studies: No results for input(s): IRON, TIBC, TRANSFERRIN, FERRITIN in the last 72 hours. _1 @ Studies/Results: Dg Chest Port 1 View  Result Date: 03/02/2018 CLINICAL DATA:  Hypoxia EXAM: PORTABLE CHEST 1 VIEW COMPARISON:  March 01, 2018 FINDINGS: Endotracheal tube tip is 3.7 cm above the carina. Nasogastric tube tip and side port are in the stomach. Right subclavian catheter tip is at the cavoatrial junction. No pneumothorax. There is cardiomegaly with pulmonary venous hypertension. There are small pleural effusions bilaterally. There is atelectatic change in the lung bases. A degree of consolidation in the left base is questioned. There is advanced arthropathy in the left shoulder. IMPRESSION: Tube and catheter positions as described without pneumothorax. Pulmonary vascular congestion present with small pleural effusions bilaterally. There is bibasilar atelectasis with a questionable degree of consolidation in the left base. Aortic Atherosclerosis (ICD10-I70.0). Electronically Signed   By: Lowella Grip III M.D.   On: 03/02/2018 07:38   Dg  Chest Port 1 View  Result Date: 03/01/2018 CLINICAL DATA:  Ventilator dependent respiratory failure. Follow-up pulmonary edema. EXAM: PORTABLE CHEST 1 VIEW COMPARISON:  02/28/2018, 02/28/2016 and earlier. FINDINGS: Endotracheal tube tip in satisfactory position projecting approximately 3-4 cm above the carina. RIGHT arm PICC tip projects at or near the cavoatrial junction, unchanged. Nasogastric tube looped in the stomach with its tip in the fundus. Markedly suboptimal inspiration with worsening atelectasis in the lung bases, LEFT greater than RIGHT. Resolution of  interstitial pulmonary edema. Persistent pulmonary venous hypertension. IMPRESSION: 1.  Support apparatus satisfactory. 2. Resolution of interstitial pulmonary edema since yesterday, though pulmonary venous hypertension persists. 3. Worsening bibasilar atelectasis, LEFT greater than RIGHT. Electronically Signed   By: Evangeline Dakin M.D.   On: 03/01/2018 09:01   Dg Chest Port 1 View  Result Date: 02/28/2018 CLINICAL DATA:  Intubation. EXAM: PORTABLE CHEST 1 VIEW COMPARISON:  02/26/2018 FINDINGS: The endotracheal tube is 3.3 cm above the carina. There is an NG tube coursing down the esophagus and into the stomach. The right-sided PICC line is stable. Significant worsening lung aeration with very low lung volumes, vascular crowding, pulmonary edema and basilar atelectasis. The heart remains enlarged and there is tortuosity and calcification of the thoracic aorta. IMPRESSION: 1. The endotracheal tube and NG tubes are in good position. 2. Very low lung volumes with vascular crowding and atelectasis 3. Vascular congestion and pulmonary edema. Electronically Signed   By: Marijo Sanes M.D.   On: 02/28/2018 16:37   Medications: . sodium chloride 75 mL/hr at 03/02/18 1030  . sodium chloride    . amiodarone 30 mg/hr (03/02/18 0815)  . ertapenem    . phenylephrine (NEO-SYNEPHRINE) Adult infusion Stopped (03/02/18 1225)   . budesonide (PULMICORT) nebulizer solution  0.5 mg Nebulization BID  . chlorhexidine gluconate (MEDLINE KIT)  15 mL Mouth Rinse BID  . Chlorhexidine Gluconate Cloth  6 each Topical Daily  . [START ON 03/03/2018] enoxaparin (LOVENOX) injection  40 mg Subcutaneous Q24H  . ipratropium  0.5 mg Nebulization Q6H  . levalbuterol  1.25 mg Nebulization Q6H  . mouth rinse  15 mL Mouth Rinse 10 times per day  . [START ON 03/03/2018] methylPREDNISolone (SOLU-MEDROL) injection  60 mg Intravenous Q24H  . mupirocin ointment  1 application Nasal BID  . pantoprazole (PROTONIX) IV  40 mg Intravenous Q12H   . sodium chloride flush  10-40 mL Intracatheter Q12H    Assessment/Plan:   **peritonitis in setting of diverticulitis and newly dx tubular adenoma:  S/p resection and colostomy 2/14.  Antibiotics dose for renal function per primary.  **AKI:  Likely hemodynamic in origin with hypoperfusion from hypotension with BPs as low as 70/50s.  Creatinine with modest improvement today.  Due to resuscitation she is quite volume up.  D/C MIVF. Will diurese lasix 60 IV TID, monitor response.   **Hyponatremia:  Resolved.  Certainly high risk for SIADH so avoid hypotonic fluids and monitor daily.   **VDRF, COPD:  Oxygenation normal on PEEP 5, FiO2 35%.  CXR this AM with congestion, diuresis per above.   **HTN: ARB on hold in light of hypotension.  Currently normotensive. If becomes HTN again would avoid ARB use in the setting of quite severe AKI and possibility of fluid shifts post op.   **Atrial fibrillation:  Currently rate controlled on amiodarone gtt started in light of hypotension.     Jannifer Hick MD 03/02/2018, 1:53 PM  [facility] Pager: [phone]

## 2018-03-02 NOTE — Progress Notes (Signed)
Art line is positional which it has been since day 1. Though it has good wave form at times and good blood return. Blood pressure reading can be deceiving low or high. . Positive with fluids by 8324 as of this note. Albumin 2.3 as of 2/13

## 2018-03-02 NOTE — Progress Notes (Signed)
Subjective: She is much more alert and awake.  She is on CPAP and doing well.  Only a small amount of secretions.  She is however still on pressors.  Objective: Vital signs in last 24 hours: Temp:  [97.3 F (36.3 C)-98.6 F (37 C)] 97.9 F (36.6 C) (02/16 0812) Pulse Rate:  [65-87] 70 (02/16 0812) Resp:  [15-25] 17 (02/16 0812) BP: (93-137)/(40-67) 133/66 (02/16 0700) SpO2:  [93 %-99 %] 99 % (02/16 0812) Arterial Line BP: (92-130)/(43-112) 105/68 (02/16 0100) FiO2 (%):  [35 %] 35 % (02/16 0419) Weight:  [119 kg] 119 kg (02/16 0500) Weight change: 7 kg Last BM Date: 02/23/18  Intake/Output from previous day: 02/15 0701 - 02/16 0700 In: 3788.3 [I.V.:3664.6; IV Piggyback:123.7] Out: 338 [Urine:278; Stool:60]  PHYSICAL EXAM General appearance: alert, cooperative, moderate distress and Intubated Resp: rhonchi bilaterally Cardio: regular rate and rhythm, S1, S2 normal, no murmur, click, rub or gallop GI: soft, non-tender; bowel sounds normal; no masses,  no organomegaly Extremities: extremities normal, atraumatic, no cyanosis or edema  Lab Results:  Results for orders placed or performed during the hospital encounter of 02/20/18 (from the past 48 hour(s))  Aerobic/Anaerobic Culture (surgical/deep wound)     Status: None (Preliminary result)   Collection Time: 02/28/18  2:27 PM  Result Value Ref Range   Specimen Description      WOUND Performed at [facility], [address] [place], [place] [address]    Special Requests      ABSCESS Performed at [facility], [address], [place], [place] [address]    Gram Stain      RARE WBC PRESENT, PREDOMINANTLY PMN RARE GRAM POSITIVE COCCI RARE GRAM VARIABLE ROD RARE GRAM POSITIVE RODS    Culture      NO GROWTH < 12 HOURS Performed at [facility], [place] [address] [place], [place] [address]    Report Status PENDING   Blood gas, arterial     Status: Abnormal   Collection Time: 02/28/18  4:41 PM  Result Value Ref Range    FIO2 60.00    pH, Arterial 7.347 (L) 7.350 - 7.450   pCO2 arterial 40.3 32.0 - 48.0 mmHg   pO2, Arterial 92.6 83.0 - 108.0 mmHg   Bicarbonate 21.6 20.0 - 28.0 mmol/L   Acid-base deficit 3.2 (H) 0.0 - 2.0 mmol/L   O2 Saturation 96.4 %   Patient temperature 37.0    Allens test (pass/fail) PASS PASS    Comment: Performed at [facility], [address] [place], [place] [address]  Glucose, capillary     Status: Abnormal   Collection Time: 02/28/18  4:59 PM  Result Value Ref Range   Glucose-Capillary 144 (H) 70 - 99 mg/dL   Comment 1 Notify RN    Comment 2 Document in Chart   Glucose, capillary     Status: Abnormal   Collection Time: 02/28/18  8:57 PM  Result Value Ref Range   Glucose-Capillary 145 (H) 70 - 99 mg/dL   Comment 1 Notify RN    Comment 2 Document in Chart   CBC     Status: Abnormal   Collection Time: 03/01/18  4:19 AM  Result Value Ref Range   WBC 25.0 (H) 4.0 - 10.5 K/uL   RBC 3.36 (L) 3.87 - 5.11 MIL/uL   Hemoglobin 10.4 (L) 12.0 - 15.0 g/dL   HCT 31.7 (L) 36.0 - 46.0 %   MCV 94.3 80.0 - 100.0 fL   MCH 31.0 26.0 -  34.0 pg   MCHC 32.8 30.0 - 36.0 g/dL   RDW 13.5 11.5 - 15.5 %   Platelets 367 150 - 400 K/uL   nRBC 0.1 0.0 - 0.2 %    Comment: Performed at [facility], [address] [place], [place] [address]  Basic metabolic panel     Status: Abnormal   Collection Time: 03/01/18  4:19 AM  Result Value Ref Range   Sodium 134 (L) 135 - 145 mmol/L   Potassium 4.3 3.5 - 5.1 mmol/L   Chloride 105 98 - 111 mmol/L   CO2 21 (L) 22 - 32 mmol/L   Glucose, Bld 162 (H) 70 - 99 mg/dL   BUN 40 (H) 8 - 23 mg/dL   Creatinine, Ser 1.95 (H) 0.44 - 1.00 mg/dL   Calcium 7.7 (L) 8.9 - 10.3 mg/dL   GFR calc non Af Amer 24 (L) >60 mL/min   GFR calc Af Amer 27 (L) >60 mL/min   Anion gap 8 5 - 15    Comment: Performed at [facility], [address] [place], [place] [address]  Phosphorus     Status: None   Collection Time: 03/01/18  4:19 AM  Result Value Ref Range    Phosphorus 4.0 2.5 - 4.6 mg/dL    Comment: Performed at [facility], [address] [place], [place] [address]  Magnesium     Status: None   Collection Time: 03/01/18  4:19 AM  Result Value Ref Range   Magnesium 2.4 1.7 - 2.4 mg/dL    Comment: Performed at [facility], [address] [place], [place] [address]  Blood gas, arterial     Status: Abnormal   Collection Time: 03/01/18  5:20 AM  Result Value Ref Range   FIO2 35.00    pH, Arterial 7.401 7.350 - 7.450   pCO2 arterial 35.2 32.0 - 48.0 mmHg   pO2, Arterial 85.6 83.0 - 108.0 mmHg   Bicarbonate 22.4 20.0 - 28.0 mmol/L   Acid-base deficit 2.6 (H) 0.0 - 2.0 mmol/L   O2 Saturation 95.4 %   Patient temperature 37.0    Allens test (pass/fail) PASS PASS    Comment: Performed at [facility], [address], [place], [place] [address]  Glucose, capillary     Status: Abnormal   Collection Time: 03/01/18  8:12 AM  Result Value Ref Range   Glucose-Capillary 157 (H) 70 - 99 mg/dL  Glucose, capillary     Status: Abnormal   Collection Time: 03/01/18 11:28 AM  Result Value Ref Range   Glucose-Capillary 146 (H) 70 - 99 mg/dL  Glucose, capillary     Status: Abnormal   Collection Time: 03/01/18  4:10 PM  Result Value Ref Range   Glucose-Capillary 154 (H) 70 - 99 mg/dL  Sodium, urine, random     Status: None   Collection Time: 03/01/18  7:50 PM  Result Value Ref Range   Sodium, Ur <10 mmol/L    Comment: Performed at [facility], [address] [place], [place] [address]  Creatinine, urine, random     Status: None   Collection Time: 03/01/18  7:50 PM  Result Value Ref Range   Creatinine, Urine 150.91 mg/dL    Comment: Performed at [facility], [address], [place], [place] [address]  Glucose, capillary     Status: Abnormal   Collection Time: 03/01/18  7:54 PM  Result Value Ref Range   Glucose-Capillary 146 (H) 70 - 99 mg/dL  Glucose, capillary  Status: Abnormal   Collection Time: 03/02/18 12:15 AM  Result Value Ref Range    Glucose-Capillary 133 (H) 70 - 99 mg/dL   Comment 1 Notify RN    Comment 2 Document in Chart   Glucose, capillary     Status: Abnormal   Collection Time: 03/02/18  3:48 AM  Result Value Ref Range   Glucose-Capillary 134 (H) 70 - 99 mg/dL   Comment 1 Notify RN    Comment 2 Document in Chart   Blood gas, arterial     Status: Abnormal   Collection Time: 03/02/18  4:00 AM  Result Value Ref Range   FIO2 35.00    Delivery systems VENTILATOR    Mode PRESSURE SUPPORT    Peep/cpap 5.0 cm H20   Inspiratory PAP 16    Expiratory PAP 5.0    Pressure support 11.0 cm H20   pH, Arterial 7.408 7.350 - 7.450   pCO2 arterial 31.8 (L) 32.0 - 48.0 mmHg   pO2, Arterial 78.8 (L) 83.0 - 108.0 mmHg   Bicarbonate 21.2 20.0 - 28.0 mmol/L   Acid-base deficit 4.3 (H) 0.0 - 2.0 mmol/L   O2 Saturation 95.2 %   Patient temperature 37.0    Collection site ARTERIAL LINE    Drawn by [id number]    Sample type ARTERIAL    Allens test (pass/fail) NOT INDICATED (A) PASS    Comment: Performed at [facility], [address] [place], [place] [address]  CBC     Status: Abnormal   Collection Time: 03/02/18  4:48 AM  Result Value Ref Range   WBC 18.3 (H) 4.0 - 10.5 K/uL   RBC 2.95 (L) 3.87 - 5.11 MIL/uL   Hemoglobin 8.9 (L) 12.0 - 15.0 g/dL   HCT 27.3 (L) 36.0 - 46.0 %   MCV 92.5 80.0 - 100.0 fL   MCH 30.2 26.0 - 34.0 pg   MCHC 32.6 30.0 - 36.0 g/dL   RDW 13.8 11.5 - 15.5 %   Platelets 247 150 - 400 K/uL   nRBC 0.0 0.0 - 0.2 %    Comment: Performed at [facility], [address] [place], [place] [address]  Basic metabolic panel     Status: Abnormal   Collection Time: 03/02/18  4:48 AM  Result Value Ref Range   Sodium 135 135 - 145 mmol/L   Potassium 4.9 3.5 - 5.1 mmol/L    Comment: MODERATE HEMOLYSIS   Chloride 108 98 - 111 mmol/L   CO2 18 (L) 22 - 32 mmol/L   Glucose, Bld 147 (H) 70 - 99 mg/dL   BUN 47 (H) 8 - 23 mg/dL   Creatinine, Ser 1.73 (H) 0.44 - 1.00 mg/dL   Calcium 8.0 (L) 8.9 - 10.3 mg/dL   GFR  calc non Af Amer 27 (L) >60 mL/min   GFR calc Af Amer 32 (L) >60 mL/min   Anion gap 9 5 - 15    Comment: Performed at [facility], [address], [place], [place] [address]  Glucose, capillary     Status: Abnormal   Collection Time: 03/02/18  8:14 AM  Result Value Ref Range   Glucose-Capillary 137 (H) 70 - 99 mg/dL    ABGS Recent Labs    03/02/18 0400  PHART 7.408  PO2ART 78.8*  HCO3 21.2   CULTURES Recent Results (from the past 240 hour(s))  Culture, blood (Routine X 2) w Reflex to ID Panel     Status: None (Preliminary result)   Collection Time:  02/26/18  9:25 AM  Result Value Ref Range Status   Specimen Description LEFT ANTECUBITAL  Final   Special Requests   Final    BOTTLES DRAWN AEROBIC AND ANAEROBIC Blood Culture adequate volume   Culture   Final    NO GROWTH 4 DAYS Performed at [facility], [address], [place], [place] [address]    Report Status PENDING  Incomplete  Culture, blood (Routine X 2) w Reflex to ID Panel     Status: None (Preliminary result)   Collection Time: 02/26/18  9:33 AM  Result Value Ref Range Status   Specimen Description BLOOD LEFT HAND  Final   Special Requests   Final    BOTTLES DRAWN AEROBIC AND ANAEROBIC Blood Culture adequate volume   Culture   Final    NO GROWTH 4 DAYS Performed at [facility], [address] [place], [place] [address]    Report Status PENDING  Incomplete  Culture, Urine     Status: None   Collection Time: 02/26/18  5:12 PM  Result Value Ref Range Status   Specimen Description   Final    URINE, RANDOM Performed at [facility], [address], [place], [place] [address]    Special Requests   Final    NONE Performed at [facility], [address], [place], [place] [address]    Culture   Final    NO GROWTH Performed at [facility], [place] [address] [place], [place] [address]    Report Status 02/28/2018 FINAL  Final  Respiratory Panel by PCR     Status: None   Collection Time: 02/27/18  8:07 AM   Result Value Ref Range Status   Adenovirus NOT DETECTED NOT DETECTED Final   Coronavirus 229E NOT DETECTED NOT DETECTED Final    Comment: (NOTE) The Coronavirus on the Respiratory Panel, DOES NOT test for the novel  Coronavirus (2019 nCoV)    Coronavirus HKU1 NOT DETECTED NOT DETECTED Final   Coronavirus NL63 NOT DETECTED NOT DETECTED Final   Coronavirus OC43 NOT DETECTED NOT DETECTED Final   Metapneumovirus NOT DETECTED NOT DETECTED Final   Rhinovirus / Enterovirus NOT DETECTED NOT DETECTED Final   Influenza A NOT DETECTED NOT DETECTED Final   Influenza B NOT DETECTED NOT DETECTED Final   Parainfluenza Virus 1 NOT DETECTED NOT DETECTED Final   Parainfluenza Virus 2 NOT DETECTED NOT DETECTED Final   Parainfluenza Virus 3 NOT DETECTED NOT DETECTED Final   Parainfluenza Virus 4 NOT DETECTED NOT DETECTED Final   Respiratory Syncytial Virus NOT DETECTED NOT DETECTED Final   Bordetella pertussis NOT DETECTED NOT DETECTED Final   Chlamydophila pneumoniae NOT DETECTED NOT DETECTED Final   Mycoplasma pneumoniae NOT DETECTED NOT DETECTED Final    Comment: Performed at [facility], [place] [address] [place], [place] [address]  Surgical PCR screen     Status: None   Collection Time: 02/28/18  7:52 AM  Result Value Ref Range Status   MRSA, PCR NEGATIVE NEGATIVE Final   Staphylococcus aureus NEGATIVE NEGATIVE Final    Comment: (NOTE) The Xpert SA Assay (FDA approved for NASAL specimens in patients 68 years of age and older), is one component of a comprehensive surveillance program. It is not intended to diagnose infection nor to guide or monitor treatment. Performed at [facility], [address] [place], [place] [address]   Aerobic/Anaerobic Culture (surgical/deep wound)     Status: None (Preliminary result)   Collection Time: 02/28/18  2:27  PM  Result Value Ref Range Status   Specimen Description   Final    WOUND Performed at [facility], [address], [place], [place]  [address]    Special Requests   Final    ABSCESS Performed at [facility], [address] [place], [place] [address]    Gram Stain   Final    RARE WBC PRESENT, PREDOMINANTLY PMN RARE GRAM POSITIVE COCCI RARE GRAM VARIABLE ROD RARE GRAM POSITIVE RODS    Culture   Final    NO GROWTH < 12 HOURS Performed at [facility], [place] [address], [place], [place] [address]    Report Status PENDING  Incomplete   Studies/Results: Dg Chest Port 1 View  Result Date: 03/02/2018 CLINICAL DATA:  Hypoxia EXAM: PORTABLE CHEST 1 VIEW COMPARISON:  March 01, 2018 FINDINGS: Endotracheal tube tip is 3.7 cm above the carina. Nasogastric tube tip and side port are in the stomach. Right subclavian catheter tip is at the cavoatrial junction. No pneumothorax. There is cardiomegaly with pulmonary venous hypertension. There are small pleural effusions bilaterally. There is atelectatic change in the lung bases. A degree of consolidation in the left base is questioned. There is advanced arthropathy in the left shoulder. IMPRESSION: Tube and catheter positions as described without pneumothorax. Pulmonary vascular congestion present with small pleural effusions bilaterally. There is bibasilar atelectasis with a questionable degree of consolidation in the left base. Aortic Atherosclerosis (ICD10-I70.0). Electronically Signed   By: Lowella Grip III M.D.   On: 03/02/2018 07:38   Dg Chest Port 1 View  Result Date: 03/01/2018 CLINICAL DATA:  Ventilator dependent respiratory failure. Follow-up pulmonary edema. EXAM: PORTABLE CHEST 1 VIEW COMPARISON:  02/28/2018, 02/28/2016 and earlier. FINDINGS: Endotracheal tube tip in satisfactory position projecting approximately 3-4 cm above the carina. RIGHT arm PICC tip projects at or near the cavoatrial junction, unchanged. Nasogastric tube looped in the stomach with its tip in the fundus. Markedly suboptimal inspiration with worsening atelectasis in the lung bases, LEFT greater than  RIGHT. Resolution of interstitial pulmonary edema. Persistent pulmonary venous hypertension. IMPRESSION: 1.  Support apparatus satisfactory. 2. Resolution of interstitial pulmonary edema since yesterday, though pulmonary venous hypertension persists. 3. Worsening bibasilar atelectasis, LEFT greater than RIGHT. Electronically Signed   By: Evangeline Dakin M.D.   On: 03/01/2018 09:01   Dg Chest Port 1 View  Result Date: 02/28/2018 CLINICAL DATA:  Intubation. EXAM: PORTABLE CHEST 1 VIEW COMPARISON:  02/26/2018 FINDINGS: The endotracheal tube is 3.3 cm above the carina. There is an NG tube coursing down the esophagus and into the stomach. The right-sided PICC line is stable. Significant worsening lung aeration with very low lung volumes, vascular crowding, pulmonary edema and basilar atelectasis. The heart remains enlarged and there is tortuosity and calcification of the thoracic aorta. IMPRESSION: 1. The endotracheal tube and NG tubes are in good position. 2. Very low lung volumes with vascular crowding and atelectasis 3. Vascular congestion and pulmonary edema. Electronically Signed   By: Marijo Sanes M.D.   On: 02/28/2018 16:37    Medications:  Prior to Admission:  Medications Prior to Admission  Medication Sig Dispense Refill Last Dose  . aspirin EC 81 MG tablet Take 81 mg by mouth daily.   02/20/2018 at Unknown time  . Biotin 1 MG CAPS Take by mouth.   02/19/2018 at Unknown time  . Cholecalciferol (VITAMIN D3) 5000 units TABS Take 5,000 Units by mouth daily.   02/19/2018 at Unknown time  . CRANBERRY PO  Take by mouth.   02/20/2018 at Unknown time  . Docusate Sodium (STOOL SOFTENER LAXATIVE PO) Take 2 tablets by mouth every 6 (six) hours as needed.   02/20/2018 at Unknown time  . furosemide (LASIX) 20 MG tablet Take 83m twice daily, at 6am and 2pm 120 tablet 5 02/19/2018 at Unknown time  . GLUCOSAMINE SULFATE PO Take 2 tablets by mouth daily.    02/19/2018 at Unknown time  . Hypromellose (ARTIFICIAL TEARS OP)  Place 2 drops into both eyes daily as needed (for dry eyes).   02/19/2018 at Unknown time  . losartan (COZAAR) 25 MG tablet TAKE 1 TABLET(25 MG) BY MOUTH EVERY MORNING 90 tablet 1 02/20/2018 at Unknown time  . Magnesium 250 MG TABS Take by mouth.   02/19/2018 at Unknown time  . magnesium hydroxide (MILK OF MAGNESIA) 400 MG/5ML suspension Take 15 mLs by mouth daily as needed for mild constipation. 355 mL 0 02/20/2018 at Unknown time  . montelukast (SINGULAIR) 10 MG tablet Take 10 mg by mouth every morning.  0 02/19/2018 at Unknown time  . polyethylene glycol powder (GLYCOLAX/MIRALAX) powder Take 17 g by mouth 2 (two) times daily as needed. 3350 g 1 02/20/2018 at Unknown time  . vitamin B-12 (CYANOCOBALAMIN) 500 MCG tablet Take 500 mcg by mouth daily.   02/19/2018 at Unknown time  . HYDROcodone-acetaminophen (NORCO) 10-325 MG tablet Take 1 tablet by mouth every 6 (six) hours as needed. 20 tablet 0 unknown   Scheduled: . budesonide (PULMICORT) nebulizer solution  0.5 mg Nebulization BID  . chlorhexidine gluconate (MEDLINE KIT)  15 mL Mouth Rinse BID  . Chlorhexidine Gluconate Cloth  6 each Topical Daily  . [START ON 03/03/2018] enoxaparin (LOVENOX) injection  40 mg Subcutaneous Q24H  . ipratropium  0.5 mg Nebulization Q6H  . levalbuterol  1.25 mg Nebulization Q6H  . mouth rinse  15 mL Mouth Rinse 10 times per day  . methylPREDNISolone (SOLU-MEDROL) injection  60 mg Intravenous Q8H  . mupirocin ointment  1 application Nasal BID  . pantoprazole (PROTONIX) IV  40 mg Intravenous Q12H  . sodium chloride flush  10-40 mL Intracatheter Q12H   Continuous: . sodium chloride Stopped (02/28/18 1229)  . sodium chloride 125 mL/hr at 03/02/18 0419  . sodium chloride    . amiodarone 30 mg/hr (03/02/18 0815)  . ertapenem    . phenylephrine (NEO-SYNEPHRINE) Adult infusion 10 mcg/min (03/02/18 0904)   PIDP:OEUMP/NTIRWERXarterial line **AND** sodium chloride, acetaminophen **OR** acetaminophen, fentaNYL (SUBLIMAZE)  injection, fentaNYL (SUBLIMAZE) injection, fentaNYL (SUBLIMAZE) injection, hydrALAZINE, HYDROmorphone (DILAUDID) injection, iohexol, iopamidol, levalbuterol, LORazepam, midazolam, midazolam, ondansetron **OR** ondansetron (ZOFRAN) IV, oxyCODONE-acetaminophen, phenol, sodium chloride flush, sodium chloride flush  Assesment: She was admitted with bowel obstruction and ended up with bowel perforation and fecal peritonitis.  She had surgery with ostomy creation done on the 14th.  She has been on the ventilator since.  Her situation has been complicated by atrial fib with rapid ventricular response for which she is on amiodarone and she is in sinus rhythm now.  Also complicated by hypotension requiring pressor support.  She is still on pressors but at a lower dose  She has acute kidney injury and her renal function looks a little bit better.  She may have pneumonia on chest x-ray and current antibiotics will cover that.  Her urine output it has been okay but it looks like she may have some volume overload on chest x-ray.  She is about 10 L positive. Active Problems:   Mass  of colon   Abnormal CT scan, sigmoid colon   Diverticulitis of large intestine without perforation or abscess without bleeding   Large bowel obstruction (HCC)   Constipation   Fever   AKI (acute kidney injury) ([place])   Diverticulitis of large intestine with perforation   Bowel perforation (HCC)   Fecal peritonitis ([place])   Bronchospasm    Plan: She is doing okay on CPAP/pressure support.  She has now been on for about 10 hours and does not show any signs of tiring.  I like to be able to get her down to minimal dose of pressor before we extubate but her blood pressures in the 130s so I think we can do that    LOS: 10 days   Regina Hall 03/02/2018, 9:29 AM

## 2018-03-02 NOTE — Progress Notes (Signed)
Patient has been changed over to [facility]  Rate of 14 as before. She is still demanding over her title volume. She is beginning to have pain. But appears to be ventilating well. No Problem with saturations still 100 percent.

## 2018-03-03 ENCOUNTER — Inpatient Hospital Stay (HOSPITAL_COMMUNITY): Payer: Medicare Other

## 2018-03-03 ENCOUNTER — Encounter (HOSPITAL_COMMUNITY): Payer: Self-pay | Admitting: General Surgery

## 2018-03-03 DIAGNOSIS — J811 Chronic pulmonary edema: Secondary | ICD-10-CM

## 2018-03-03 DIAGNOSIS — J810 Acute pulmonary edema: Secondary | ICD-10-CM

## 2018-03-03 DIAGNOSIS — I4891 Unspecified atrial fibrillation: Secondary | ICD-10-CM

## 2018-03-03 LAB — CULTURE, BLOOD (ROUTINE X 2)
Culture: NO GROWTH
Culture: NO GROWTH
SPECIAL REQUESTS: ADEQUATE
Special Requests: ADEQUATE

## 2018-03-03 LAB — COMPREHENSIVE METABOLIC PANEL
ALT: 9 U/L (ref 0–44)
ANION GAP: 7 (ref 5–15)
AST: 11 U/L — ABNORMAL LOW (ref 15–41)
Albumin: 1.8 g/dL — ABNORMAL LOW (ref 3.5–5.0)
Alkaline Phosphatase: 33 U/L — ABNORMAL LOW (ref 38–126)
BUN: 53 mg/dL — ABNORMAL HIGH (ref 8–23)
CO2: 22 mmol/L (ref 22–32)
Calcium: 8.3 mg/dL — ABNORMAL LOW (ref 8.9–10.3)
Chloride: 110 mmol/L (ref 98–111)
Creatinine, Ser: 1.53 mg/dL — ABNORMAL HIGH (ref 0.44–1.00)
GFR calc Af Amer: 37 mL/min — ABNORMAL LOW (ref >60)
GFR calc non Af Amer: 32 mL/min — ABNORMAL LOW (ref >60)
Glucose, Bld: 148 mg/dL — ABNORMAL HIGH (ref 70–99)
Potassium: 4 mmol/L (ref 3.5–5.1)
Sodium: 139 mmol/L (ref 135–145)
Total Bilirubin: 0.3 mg/dL (ref 0.3–1.2)
Total Protein: 4.3 g/dL — ABNORMAL LOW (ref 6.5–8.1)

## 2018-03-03 LAB — BLOOD GAS, ARTERIAL
ACID-BASE DEFICIT: 3.1 mmol/L — AB (ref 0.0–2.0)
Bicarbonate: 21.9 mmol/L (ref 20.0–28.0)
FIO2: 40
O2 SAT: 96 %
Patient temperature: 37
pCO2 arterial: 35.9 mmHg (ref 32.0–48.0)
pH, Arterial: 7.386 (ref 7.350–7.450)
pO2, Arterial: 84.2 mmHg (ref 83.0–108.0)

## 2018-03-03 LAB — CBC
HCT: 26.3 % — ABNORMAL LOW (ref 36.0–46.0)
Hemoglobin: 8.4 g/dL — ABNORMAL LOW (ref 12.0–15.0)
MCH: 29.6 pg (ref 26.0–34.0)
MCHC: 31.9 g/dL (ref 30.0–36.0)
MCV: 92.6 fL (ref 80.0–100.0)
PLATELETS: 222 10*3/uL (ref 150–400)
RBC: 2.84 MIL/uL — ABNORMAL LOW (ref 3.87–5.11)
RDW: 14 % (ref 11.5–15.5)
WBC: 17.4 10*3/uL — ABNORMAL HIGH (ref 4.0–10.5)
nRBC: 0.1 % (ref 0.0–0.2)

## 2018-03-03 LAB — GLUCOSE, CAPILLARY
Glucose-Capillary: 117 mg/dL — ABNORMAL HIGH (ref 70–99)
Glucose-Capillary: 124 mg/dL — ABNORMAL HIGH (ref 70–99)
Glucose-Capillary: 133 mg/dL — ABNORMAL HIGH (ref 70–99)
Glucose-Capillary: 139 mg/dL — ABNORMAL HIGH (ref 70–99)
Glucose-Capillary: 147 mg/dL — ABNORMAL HIGH (ref 70–99)

## 2018-03-03 LAB — ECHOCARDIOGRAM COMPLETE
Height: 66 in
Weight: 4183.45 oz

## 2018-03-03 MED ORDER — ALBUMIN HUMAN 25 % IV SOLN
25.0000 g | Freq: Three times a day (TID) | INTRAVENOUS | Status: DC
  Administered 2018-03-03 – 2018-03-04 (×3): 25 g via INTRAVENOUS
  Filled 2018-03-03: qty 100
  Filled 2018-03-03 (×2): qty 500
  Filled 2018-03-03 (×2): qty 100
  Filled 2018-03-03 (×6): qty 500

## 2018-03-03 MED ORDER — LINEZOLID 600 MG/300ML IV SOLN
600.0000 mg | Freq: Two times a day (BID) | INTRAVENOUS | Status: DC
  Administered 2018-03-03 – 2018-03-13 (×20): 600 mg via INTRAVENOUS
  Filled 2018-03-03 (×24): qty 300

## 2018-03-03 MED ORDER — SODIUM CHLORIDE 0.9 % IV SOLN
1.0000 g | Freq: Two times a day (BID) | INTRAVENOUS | Status: DC
  Filled 2018-03-03 (×2): qty 1

## 2018-03-03 MED ORDER — FUROSEMIDE 10 MG/ML IJ SOLN
160.0000 mg | Freq: Three times a day (TID) | INTRAVENOUS | Status: DC
  Administered 2018-03-03 – 2018-03-04 (×3): 160 mg via INTRAVENOUS
  Filled 2018-03-03 (×7): qty 16

## 2018-03-03 MED ORDER — SODIUM CHLORIDE 0.9 % IV SOLN
1.0000 g | INTRAVENOUS | Status: DC
  Administered 2018-03-03 – 2018-03-12 (×10): 1000 mg via INTRAVENOUS
  Filled 2018-03-03 (×11): qty 1

## 2018-03-03 NOTE — Progress Notes (Signed)
  Echocardiogram 2D Echocardiogram has been performed.  Regina Hall 03/03/2018, 1:34 PM

## 2018-03-03 NOTE — Progress Notes (Addendum)
3 Days Post-Op  Subjective: On vent, easily arousable.  Objective: Vital signs in last 24 hours: Temp:  [97 F (36.1 C)-98.5 F (36.9 C)] 98.5 F (36.9 C) (02/17 0814) Pulse Rate:  [30-91] 30 (02/17 0800) Resp:  [10-19] 14 (02/17 1000) BP: (102-151)/(55-84) 138/64 (02/17 1000) SpO2:  [62 %-100 %] 100 % (02/17 0825) Arterial Line BP: (90-143)/(74-137) 93/79 (02/16 1700) FiO2 (%):  [35 %-40 %] 40 % (02/17 0825) Weight:  [118.6 kg] 118.6 kg (02/17 0500) Last BM Date: 02/23/18  Intake/Output from previous day: 02/16 0701 - 02/17 0700 In: 1662.8 [I.V.:1462.8; IV Piggyback:200] Out: 7342 [Urine:1650] Intake/Output this shift: No intake/output data recorded.  General appearance: cooperative and no distress Resp: Intermittent rales but no wheezing.  On vent. Cardio: regular rate and rhythm, S1, S2 normal, no murmur, click, rub or gallop GI: Soft, incision healing well.  No purulent drainage noted.  Lab Results:  Recent Labs    03/02/18 0448 03/03/18 0445  WBC 18.3* 17.4*  HGB 8.9* 8.4*  HCT 27.3* 26.3*  PLT 247 222   BMET Recent Labs    03/02/18 0448 03/03/18 0445  NA 135 139  K 4.9 4.0  CL 108 110  CO2 18* 22  GLUCOSE 147* 148*  BUN 47* 53*  CREATININE 1.73* 1.53*  CALCIUM 8.0* 8.3*   PT/INR No results for input(s): LABPROT, INR in the last 72 hours.  Studies/Results: Dg Chest Port 1 View  Result Date: 03/03/2018 CLINICAL DATA:  Pulmonary edema. EXAM: PORTABLE CHEST 1 VIEW COMPARISON:  Radiograph of March 02, 2018. FINDINGS: Stable cardiomediastinal silhouette. Atherosclerosis of thoracic aorta is noted. Endotracheal and nasogastric tubes are unchanged in position. Right-sided PICC line is unchanged in position. Stable hypoinflation of the lungs is noted with associated bibasilar atelectasis and small pleural effusions. No pneumothorax is noted. Severe degenerative changes seen involving the left glenohumeral joint. IMPRESSION: Stable support apparatus. Stable  hypoinflation of the lungs with mild bibasilar subsegmental atelectasis and small pleural effusions. Aortic Atherosclerosis (ICD10-I70.0). Electronically Signed   By: Marijo Conception, M.D.   On: 03/03/2018 08:27   Dg Chest Port 1 View  Result Date: 03/02/2018 CLINICAL DATA:  Hypoxia EXAM: PORTABLE CHEST 1 VIEW COMPARISON:  March 01, 2018 FINDINGS: Endotracheal tube tip is 3.7 cm above the carina. Nasogastric tube tip and side port are in the stomach. Right subclavian catheter tip is at the cavoatrial junction. No pneumothorax. There is cardiomegaly with pulmonary venous hypertension. There are small pleural effusions bilaterally. There is atelectatic change in the lung bases. A degree of consolidation in the left base is questioned. There is advanced arthropathy in the left shoulder. IMPRESSION: Tube and catheter positions as described without pneumothorax. Pulmonary vascular congestion present with small pleural effusions bilaterally. There is bibasilar atelectasis with a questionable degree of consolidation in the left base. Aortic Atherosclerosis (ICD10-I70.0). Electronically Signed   By: Lowella Grip III M.D.   On: 03/02/2018 07:38    Anti-infectives: Anti-infectives (From admission, onward)   Start     Dose/Rate Route Frequency Ordered Stop   03/03/18 1700  meropenem (MERREM) 1 g in sodium chloride 0.9 % 100 mL IVPB     1 g 200 mL/hr over 30 Minutes Intravenous Every 12 hours 03/03/18 1000     03/02/18 1800  ertapenem (INVANZ) 1,000 mg in sodium chloride 0.9 % 100 mL IVPB  Status:  Discontinued     1 g 200 mL/hr over 30 Minutes Intravenous Every 24 hours 03/02/18 0829 03/03/18 0959  02/28/18 1800  ertapenem (INVANZ) 500 mg in sodium chloride 0.9 % 50 mL IVPB  Status:  Discontinued     500 mg 100 mL/hr over 30 Minutes Intravenous Every 24 hours 02/28/18 1035 03/02/18 0829   02/27/18 1800  ertapenem (INVANZ) 1,000 mg in sodium chloride 0.9 % 100 mL IVPB  Status:  Discontinued     1  g 200 mL/hr over 30 Minutes Intravenous Every 24 hours 02/27/18 1705 02/28/18 1035   02/21/18 0800  ciprofloxacin (CIPRO) IVPB 400 mg  Status:  Discontinued     400 mg 200 mL/hr over 60 Minutes Intravenous Every 12 hours 02/20/18 2204 02/27/18 1908   02/21/18 0500  metroNIDAZOLE (FLAGYL) IVPB 500 mg  Status:  Discontinued     500 mg 100 mL/hr over 60 Minutes Intravenous Every 8 hours 02/20/18 2204 02/27/18 1908   02/20/18 2115  ciprofloxacin (CIPRO) IVPB 400 mg     400 mg 200 mL/hr over 60 Minutes Intravenous  Once 02/20/18 2113 02/20/18 2335   02/20/18 2115  metroNIDAZOLE (FLAGYL) tablet 500 mg     500 mg Oral  Once 02/20/18 2113 02/20/18 2147      Assessment/Plan: s/p Procedure(s): PARTIAL COLECTOMY WITH COLOSTOMY Impression: Postoperative day 3.  Hemodynamically improved, off Neo-Synephrine, positive fluid balance, with active diuresis ongoing.  Leukocytosis stable.  Cardiac management per hospitalist. Plan: Continue current management.  May need to start enteral feeds via NG tube should she remain on a ventilator.  LOS: 11 days    [name] 03/03/2018

## 2018-03-03 NOTE — Progress Notes (Signed)
Patient A-line is unable to draw blood. Wave form still works. Nurse notified

## 2018-03-03 NOTE — Consult Note (Signed)
[facility] Nurse ostomy follow up Patient receiving care in [facility] ICU 08.  I spoke with the patient's nurse via telephone.  Patient is currently ventilated and not able to participate in ostomy teaching.  Primary RN knowledgeable on how to order ostomy supplies.  No needs identified at this time.  I plan to check back with the primary RN later today and check on extubation status. Val Riles, RN, MSN, CWOCN, CNS-BC, pager [phone]

## 2018-03-03 NOTE — Progress Notes (Signed)
Increased VT to 540 last vent check as patient seemed to be pulling and struggling with her breathing. She also stated she was having trouble she did this by nodding yes to having trouble breathing. Her rate is down to set rate as earlier it was 27

## 2018-03-03 NOTE — Consult Note (Signed)
[facility] Nurse ostomy follow up Per the primary RN, Evelena Peat, the patient is not going to be extubated today; possibly tomorrow afternoon.  I provided the following Kellie Simmering #s for supplies:  #2 for the skin barrier, #649 for the pouch, and [phone] for the barrier ring.  Val Riles, RN, MSN, CWOCN, CNS-BC, pager [phone]

## 2018-03-03 NOTE — Progress Notes (Addendum)
Subjective: She had more trouble with the ventilator last night and she is back on full support.  She is on 40%.  She is awake.  She is requiring minimal medication for pain.  She is now positive about 9 L from resuscitation.  Objective: Vital signs in last 24 hours: Temp:  [97 F (36.1 C)-97.9 F (36.6 C)] 97 F (36.1 C) (02/17 0400) Pulse Rate:  [64-91] 66 (02/17 0100) Resp:  [10-19] 15 (02/17 0100) BP: (102-151)/(55-84) 120/60 (02/17 0100) SpO2:  [81 %-100 %] 99 % (02/17 0221) Arterial Line BP: (90-143)/(67-137) 93/79 (02/16 1700) FiO2 (%):  [35 %-40 %] 40 % (02/17 0349) Weight:  [118.6 kg] 118.6 kg (02/17 0500) Weight change: -0.4 kg Last BM Date: 02/23/18  Intake/Output from previous day: 02/16 0701 - 02/17 0700 In: 1662.8 [I.V.:1462.8; IV Piggyback:200] Out: 1650 [Urine:1650]  PHYSICAL EXAM General appearance: alert and Intubated on ventilator support Resp: She has some rales and decreased breath sounds Cardio: regular rate and rhythm, S1, S2 normal, no murmur, click, rub or gallop GI: Abdomen fairly soft.  Bowel sounds are better Extremities: She has diffuse third spacing of fluid.  Lab Results:  Results for orders placed or performed during the hospital encounter of 02/20/18 (from the past 48 hour(s))  Glucose, capillary     Status: Abnormal   Collection Time: 03/01/18  8:12 AM  Result Value Ref Range   Glucose-Capillary 157 (H) 70 - 99 mg/dL  Glucose, capillary     Status: Abnormal   Collection Time: 03/01/18 11:28 AM  Result Value Ref Range   Glucose-Capillary 146 (H) 70 - 99 mg/dL  Glucose, capillary     Status: Abnormal   Collection Time: 03/01/18  4:10 PM  Result Value Ref Range   Glucose-Capillary 154 (H) 70 - 99 mg/dL  Sodium, urine, random     Status: None   Collection Time: 03/01/18  7:50 PM  Result Value Ref Range   Sodium, Ur <10 mmol/L    Comment: Performed at [facility], [address] [place], [place] [address]  Creatinine, urine, random      Status: None   Collection Time: 03/01/18  7:50 PM  Result Value Ref Range   Creatinine, Urine 150.91 mg/dL    Comment: Performed at [facility], [address] [place],  [address]  Glucose, capillary     Status: Abnormal   Collection Time: 03/01/18  7:54 PM  Result Value Ref Range   Glucose-Capillary 146 (H) 70 - 99 mg/dL  Glucose, capillary     Status: Abnormal   Collection Time: 03/02/18 12:15 AM  Result Value Ref Range   Glucose-Capillary 133 (H) 70 - 99 mg/dL   Comment 1 Notify RN    Comment 2 Document in Chart   Glucose, capillary     Status: Abnormal   Collection Time: 03/02/18  3:48 AM  Result Value Ref Range   Glucose-Capillary 134 (H) 70 - 99 mg/dL   Comment 1 Notify RN    Comment 2 Document in Chart   Blood gas, arterial     Status: Abnormal   Collection Time: 03/02/18  4:00 AM  Result Value Ref Range   FIO2 35.00    Delivery systems VENTILATOR    Mode PRESSURE SUPPORT    Peep/cpap 5.0 cm H20   Inspiratory PAP 16    Expiratory PAP 5.0    Pressure support 11.0 cm H20   pH, Arterial 7.408 7.350 - 7.450   pCO2 arterial 31.8 (L) 32.0 -  48.0 mmHg   pO2, Arterial 78.8 (L) 83.0 - 108.0 mmHg   Bicarbonate 21.2 20.0 - 28.0 mmol/L   Acid-base deficit 4.3 (H) 0.0 - 2.0 mmol/L   O2 Saturation 95.2 %   Patient temperature 37.0    Collection site ARTERIAL LINE    Drawn by [id number]    Sample type ARTERIAL    Allens test (pass/fail) NOT INDICATED (A) PASS    Comment: Performed at [facility], [address] [place], [place] [address]  CBC     Status: Abnormal   Collection Time: 03/02/18  4:48 AM  Result Value Ref Range   WBC 18.3 (H) 4.0 - 10.5 K/uL   RBC 2.95 (L) 3.87 - 5.11 MIL/uL   Hemoglobin 8.9 (L) 12.0 - 15.0 g/dL   HCT 27.3 (L) 36.0 - 46.0 %   MCV 92.5 80.0 - 100.0 fL   MCH 30.2 26.0 - 34.0 pg   MCHC 32.6 30.0 - 36.0 g/dL   RDW 13.8 11.5 - 15.5 %   Platelets 247 150 - 400 K/uL   nRBC 0.0 0.0 - 0.2 %    Comment: Performed at [facility], [address], [place], [place] [address]  Basic metabolic panel     Status: Abnormal   Collection Time: 03/02/18  4:48 AM  Result Value Ref Range   Sodium 135 135 - 145 mmol/L   Potassium 4.9 3.5 - 5.1 mmol/L    Comment: MODERATE HEMOLYSIS   Chloride 108 98 - 111 mmol/L   CO2 18 (L) 22 - 32 mmol/L   Glucose, Bld 147 (H) 70 - 99 mg/dL   BUN 47 (H) 8 - 23 mg/dL   Creatinine, Ser 1.73 (H) 0.44 - 1.00 mg/dL   Calcium 8.0 (L) 8.9 - 10.3 mg/dL   GFR calc non Af Amer 27 (L) >60 mL/min   GFR calc Af Amer 32 (L) >60 mL/min   Anion gap 9 5 - 15    Comment: Performed at [facility], [address] [place], [place] [address]  Glucose, capillary     Status: Abnormal   Collection Time: 03/02/18  8:14 AM  Result Value Ref Range   Glucose-Capillary 137 (H) 70 - 99 mg/dL  Glucose, capillary     Status: Abnormal   Collection Time: 03/02/18 11:36 AM  Result Value Ref Range   Glucose-Capillary 141 (H) 70 - 99 mg/dL  Glucose, capillary     Status: Abnormal   Collection Time: 03/02/18  9:00 PM  Result Value Ref Range   Glucose-Capillary 142 (H) 70 - 99 mg/dL   Comment 1 Notify RN    Comment 2 Document in Chart   Glucose, capillary     Status: Abnormal   Collection Time: 03/03/18 12:26 AM  Result Value Ref Range   Glucose-Capillary 147 (H) 70 - 99 mg/dL   Comment 1 Notify RN    Comment 2 Document in Chart   Blood gas, arterial     Status: Abnormal   Collection Time: 03/03/18  4:15 AM  Result Value Ref Range   FIO2 40.00    pH, Arterial 7.386 7.350 - 7.450   pCO2 arterial 35.9 32.0 - 48.0 mmHg   pO2, Arterial 84.2 83.0 - 108.0 mmHg   Bicarbonate 21.9 20.0 - 28.0 mmol/L   Acid-base deficit 3.1 (H) 0.0 - 2.0 mmol/L   O2 Saturation 96.0 %   Patient temperature 37.0    Allens test (pass/fail) PASS PASS    Comment: Performed at [facility], [address], [place], [place] [address]  Comprehensive metabolic panel     Status: Abnormal   Collection Time: 03/03/18  4:45 AM  Result Value Ref Range   Sodium 139  135 - 145 mmol/L   Potassium 4.0 3.5 - 5.1 mmol/L    Comment: DELTA CHECK NOTED   Chloride 110 98 - 111 mmol/L   CO2 22 22 - 32 mmol/L   Glucose, Bld 148 (H) 70 - 99 mg/dL   BUN 53 (H) 8 - 23 mg/dL   Creatinine, Ser 1.53 (H) 0.44 - 1.00 mg/dL   Calcium 8.3 (L) 8.9 - 10.3 mg/dL   Total Protein 4.3 (L) 6.5 - 8.1 g/dL   Albumin 1.8 (L) 3.5 - 5.0 g/dL   AST 11 (L) 15 - 41 U/L   ALT 9 0 - 44 U/L   Alkaline Phosphatase 33 (L) 38 - 126 U/L   Total Bilirubin 0.3 0.3 - 1.2 mg/dL   GFR calc non Af Amer 32 (L) >60 mL/min   GFR calc Af Amer 37 (L) >60 mL/min   Anion gap 7 5 - 15    Comment: Performed at [facility], [address] [place], [place] [address]  CBC     Status: Abnormal   Collection Time: 03/03/18  4:45 AM  Result Value Ref Range   WBC 17.4 (H) 4.0 - 10.5 K/uL   RBC 2.84 (L) 3.87 - 5.11 MIL/uL   Hemoglobin 8.4 (L) 12.0 - 15.0 g/dL   HCT 26.3 (L) 36.0 - 46.0 %   MCV 92.6 80.0 - 100.0 fL   MCH 29.6 26.0 - 34.0 pg   MCHC 31.9 30.0 - 36.0 g/dL   RDW 14.0 11.5 - 15.5 %   Platelets 222 150 - 400 K/uL   nRBC 0.1 0.0 - 0.2 %    Comment: Performed at [facility], [address] [place], [place] [address]    ABGS Recent Labs    03/03/18 0415  PHART 7.386  PO2ART 84.2  HCO3 21.9   CULTURES Recent Results (from the past 240 hour(s))  Culture, blood (Routine X 2) w Reflex to ID Panel     Status: None (Preliminary result)   Collection Time: 02/26/18  9:25 AM  Result Value Ref Range Status   Specimen Description LEFT ANTECUBITAL  Final   Special Requests   Final    BOTTLES DRAWN AEROBIC AND ANAEROBIC Blood Culture adequate volume   Culture   Final    NO GROWTH 4 DAYS Performed at [facility], [address], [place], [place] [address]    Report Status PENDING  Incomplete  Culture, blood (Routine X 2) w Reflex to ID Panel     Status: None (Preliminary result)   Collection Time: 02/26/18  9:33 AM  Result Value Ref Range Status   Specimen Description BLOOD LEFT HAND  Final    Special Requests   Final    BOTTLES DRAWN AEROBIC AND ANAEROBIC Blood Culture adequate volume   Culture   Final    NO GROWTH 4 DAYS Performed at [facility], [address], [place], [place] [address]    Report Status PENDING  Incomplete  Culture, Urine     Status: None   Collection Time: 02/26/18  5:12 PM  Result Value Ref Range Status   Specimen Description   Final    URINE, RANDOM Performed at [facility], [address] [place],  [address]    Special Requests   Final  NONE Performed at [facility], [address], [place], [place] [address]    Culture   Final    NO GROWTH Performed at [facility], [place] [address], [place], [place] [address]    Report Status 02/28/2018 FINAL  Final  Respiratory Panel by PCR     Status: None   Collection Time: 02/27/18  8:07 AM  Result Value Ref Range Status   Adenovirus NOT DETECTED NOT DETECTED Final   Coronavirus 229E NOT DETECTED NOT DETECTED Final    Comment: (NOTE) The Coronavirus on the Respiratory Panel, DOES NOT test for the novel  Coronavirus (2019 nCoV)    Coronavirus HKU1 NOT DETECTED NOT DETECTED Final   Coronavirus NL63 NOT DETECTED NOT DETECTED Final   Coronavirus OC43 NOT DETECTED NOT DETECTED Final   Metapneumovirus NOT DETECTED NOT DETECTED Final   Rhinovirus / Enterovirus NOT DETECTED NOT DETECTED Final   Influenza A NOT DETECTED NOT DETECTED Final   Influenza B NOT DETECTED NOT DETECTED Final   Parainfluenza Virus 1 NOT DETECTED NOT DETECTED Final   Parainfluenza Virus 2 NOT DETECTED NOT DETECTED Final   Parainfluenza Virus 3 NOT DETECTED NOT DETECTED Final   Parainfluenza Virus 4 NOT DETECTED NOT DETECTED Final   Respiratory Syncytial Virus NOT DETECTED NOT DETECTED Final   Bordetella pertussis NOT DETECTED NOT DETECTED Final   Chlamydophila pneumoniae NOT DETECTED NOT DETECTED Final   Mycoplasma pneumoniae NOT DETECTED NOT DETECTED Final    Comment: Performed at [facility], [place]  [address], [place], [place] [address]  Surgical PCR screen     Status: None   Collection Time: 02/28/18  7:52 AM  Result Value Ref Range Status   MRSA, PCR NEGATIVE NEGATIVE Final   Staphylococcus aureus NEGATIVE NEGATIVE Final    Comment: (NOTE) The Xpert SA Assay (FDA approved for NASAL specimens in patients 73 years of age and older), is one component of a comprehensive surveillance program. It is not intended to diagnose infection nor to guide or monitor treatment. Performed at [facility], [address] [place], [place] [address]   Aerobic/Anaerobic Culture (surgical/deep wound)     Status: None (Preliminary result)   Collection Time: 02/28/18  2:27 PM  Result Value Ref Range Status   Specimen Description   Final    WOUND Performed at [facility], [address] [place], [place] [address]    Special Requests   Final    ABSCESS Performed at [facility], [address] [place], [place] [address]    Gram Stain   Final    RARE WBC PRESENT, PREDOMINANTLY PMN RARE GRAM POSITIVE COCCI RARE GRAM VARIABLE ROD RARE GRAM POSITIVE RODS Performed at [facility], [place] [address] [place], [place] [address]    Culture   Final    FEW ENTEROCOCCUS FAECIUM SUSCEPTIBILITIES TO FOLLOW NO ANAEROBES ISOLATED; CULTURE IN PROGRESS FOR 5 DAYS    Report Status PENDING  Incomplete   Studies/Results: Dg Chest Port 1 View  Result Date: 03/02/2018 CLINICAL DATA:  Hypoxia EXAM: PORTABLE CHEST 1 VIEW COMPARISON:  March 01, 2018 FINDINGS: Endotracheal tube tip is 3.7 cm above the carina. Nasogastric tube tip and side port are in the stomach. Right subclavian catheter tip is at the cavoatrial junction. No pneumothorax. There is cardiomegaly with pulmonary venous hypertension. There are small pleural effusions bilaterally. There is atelectatic change in the lung bases. A degree of consolidation in the left base is questioned. There is advanced arthropathy in  the left shoulder. IMPRESSION: Tube and  catheter positions as described without pneumothorax. Pulmonary vascular congestion present with small pleural effusions bilaterally. There is bibasilar atelectasis with a questionable degree of consolidation in the left base. Aortic Atherosclerosis (ICD10-I70.0). Electronically Signed   By: Lowella Grip III M.D.   On: 03/02/2018 07:38    Medications:  Prior to Admission:  Medications Prior to Admission  Medication Sig Dispense Refill Last Dose  . aspirin EC 81 MG tablet Take 81 mg by mouth daily.   02/20/2018 at Unknown time  . Biotin 1 MG CAPS Take by mouth.   02/19/2018 at Unknown time  . Cholecalciferol (VITAMIN D3) 5000 units TABS Take 5,000 Units by mouth daily.   02/19/2018 at Unknown time  . CRANBERRY PO Take by mouth.   02/20/2018 at Unknown time  . Docusate Sodium (STOOL SOFTENER LAXATIVE PO) Take 2 tablets by mouth every 6 (six) hours as needed.   02/20/2018 at Unknown time  . furosemide (LASIX) 20 MG tablet Take 69m twice daily, at 6am and 2pm 120 tablet 5 02/19/2018 at Unknown time  . GLUCOSAMINE SULFATE PO Take 2 tablets by mouth daily.    02/19/2018 at Unknown time  . Hypromellose (ARTIFICIAL TEARS OP) Place 2 drops into both eyes daily as needed (for dry eyes).   02/19/2018 at Unknown time  . losartan (COZAAR) 25 MG tablet TAKE 1 TABLET(25 MG) BY MOUTH EVERY MORNING 90 tablet 1 02/20/2018 at Unknown time  . Magnesium 250 MG TABS Take by mouth.   02/19/2018 at Unknown time  . magnesium hydroxide (MILK OF MAGNESIA) 400 MG/5ML suspension Take 15 mLs by mouth daily as needed for mild constipation. 355 mL 0 02/20/2018 at Unknown time  . montelukast (SINGULAIR) 10 MG tablet Take 10 mg by mouth every morning.  0 02/19/2018 at Unknown time  . polyethylene glycol powder (GLYCOLAX/MIRALAX) powder Take 17 g by mouth 2 (two) times daily as needed. 3350 g 1 02/20/2018 at Unknown time  . vitamin B-12 (CYANOCOBALAMIN) 500 MCG tablet Take 500 mcg by mouth daily.   02/19/2018 at Unknown time  .  HYDROcodone-acetaminophen (NORCO) 10-325 MG tablet Take 1 tablet by mouth every 6 (six) hours as needed. 20 tablet 0 unknown   Scheduled: . budesonide (PULMICORT) nebulizer solution  0.5 mg Nebulization BID  . chlorhexidine gluconate (MEDLINE KIT)  15 mL Mouth Rinse BID  . Chlorhexidine Gluconate Cloth  6 each Topical Daily  . enoxaparin (LOVENOX) injection  40 mg Subcutaneous Q24H  . furosemide  60 mg Intravenous Q8H  . ipratropium  0.5 mg Nebulization Q6H  . levalbuterol  1.25 mg Nebulization Q6H  . mouth rinse  15 mL Mouth Rinse 10 times per day  . methylPREDNISolone (SOLU-MEDROL) injection  60 mg Intravenous Q24H  . mupirocin ointment  1 application Nasal BID  . pantoprazole (PROTONIX) IV  40 mg Intravenous Q12H  . sodium chloride flush  10-40 mL Intracatheter Q12H   Continuous: . sodium chloride 10 mL/hr at 03/03/18 0758  . sodium chloride    . amiodarone 30 mg/hr (03/02/18 2110)  . ertapenem Stopped (03/02/18 1753)  . phenylephrine (NEO-SYNEPHRINE) Adult infusion Stopped (03/02/18 1225)   PJAS:NKNLZ/JQBHALPFarterial line **AND** sodium chloride, acetaminophen **OR** acetaminophen, fentaNYL (SUBLIMAZE) injection, fentaNYL (SUBLIMAZE) injection, fentaNYL (SUBLIMAZE) injection, hydrALAZINE, HYDROmorphone (DILAUDID) injection, iohexol, iopamidol, levalbuterol, LORazepam, midazolam, midazolam, ondansetron **OR** ondansetron (ZOFRAN) IV, oxyCODONE-acetaminophen, phenol, sodium chloride flush, sodium chloride flush   Assesment: She was admitted with bowel obstruction eventually having bowel perforation requiring  surgery with creation of colostomy.  She remained intubated after surgery.  Yesterday she was doing well and was able to switch to pressure support but then she developed more trouble during the night last night and she is back on full ventilator support.  She is approximately 10 L positive since admission because of fluid resuscitation but was approximately even yesterday.  She is  still receiving maintenance IV fluids and I have discontinued those down to 10.  I am not sure we can extubate her at this point safely until we diurese her some  She developed atrial fib with rapid ventricular response and she is on amiodarone and in sinus rhythm now  She had hypotension which required Neo-Synephrine and she is off the Neo-Synephrine at this point.  She had acute kidney injury likely from hypotension and that is improving  She has third spacing of fluid from a combination of nutritional problems plus her fluid resuscitation.  This may impact our ability to get her off of the ventilator.  She is on Lasix.  Maintenance IV fluids have been discontinued.  She is going to require nutritional support and considering her bowel surgery likely TPN.  Timing per surgery.  Her albumin this morning is 1.8 so albumin infusion may help.  I have started albumin to be given about 1 hour prior to Lasix and see if we get better diuresis plus increase her albumin level  She had arterial line but it does not appear to be functional now Active Problems:   Mass of colon   Abnormal CT scan, sigmoid colon   Diverticulitis of large intestine without perforation or abscess without bleeding   Large bowel obstruction (HCC)   Constipation   Fever   AKI (acute kidney injury) ([place])   Diverticulitis of large intestine with perforation   Bowel perforation (HCC)   Fecal peritonitis ([place])   Bronchospasm    Plan: Continue diuresis.  Discontinue maintenance IV fluids.  Add albumin.  We will see if we can get her back to pressure support and as she diuresis we should be able to get her off the ventilator but with all the interstitial fluid she has it may not be today    LOS: 11 days   Alonza Bogus 03/03/2018, 7:57 AM

## 2018-03-03 NOTE — Progress Notes (Signed)
Subjective: Interval History: Entub, awake.  Objective: Vital signs in last 24 hours: Temp:  [97 F (36.1 C)-97.9 F (36.6 C)] 97 F (36.1 C) (02/17 0400) Pulse Rate:  [66-91] 66 (02/17 0100) Resp:  [10-19] 15 (02/17 0100) BP: (102-151)/(55-84) 120/60 (02/17 0100) SpO2:  [95 %-100 %] 99 % (02/17 0221) Arterial Line BP: (90-143)/(74-137) 93/79 (02/16 1700) FiO2 (%):  [35 %-40 %] 40 % (02/17 0349) Weight:  [118.6 kg] 118.6 kg (02/17 0500) Weight change: -0.4 kg  Intake/Output from previous day: 02/16 0701 - 02/17 0700 In: 1662.8 [I.V.:1462.8; IV Piggyback:200] Out: 7829 [Urine:1650] Intake/Output this shift: No intake/output data recorded.  General appearance: moderately obese, pale and entub Resp: diminished breath sounds bilaterally and rales bibasilar Cardio: irregularly irregular rhythm and systolic murmur: systolic ejection 2/6, crescendo and decrescendo at 2nd left intercostal space GI: mod distension , few bs,  Extremities: edema 4+  Lab Results: Recent Labs    03/02/18 0448 03/03/18 0445  WBC 18.3* 17.4*  HGB 8.9* 8.4*  HCT 27.3* 26.3*  PLT 247 222   BMET:  Recent Labs    03/02/18 0448 03/03/18 0445  NA 135 139  K 4.9 4.0  CL 108 110  CO2 18* 22  GLUCOSE 147* 148*  BUN 47* 53*  CREATININE 1.73* 1.53*  CALCIUM 8.0* 8.3*   No results for input(s): PTH in the last 72 hours. Iron Studies: No results for input(s): IRON, TIBC, TRANSFERRIN, FERRITIN in the last 72 hours.  Studies/Results: Dg Chest Port 1 View  Result Date: 03/02/2018 CLINICAL DATA:  Hypoxia EXAM: PORTABLE CHEST 1 VIEW COMPARISON:  March 01, 2018 FINDINGS: Endotracheal tube tip is 3.7 cm above the carina. Nasogastric tube tip and side port are in the stomach. Right subclavian catheter tip is at the cavoatrial junction. No pneumothorax. There is cardiomegaly with pulmonary venous hypertension. There are small pleural effusions bilaterally. There is atelectatic change in the lung bases. A  degree of consolidation in the left base is questioned. There is advanced arthropathy in the left shoulder. IMPRESSION: Tube and catheter positions as described without pneumothorax. Pulmonary vascular congestion present with small pleural effusions bilaterally. There is bibasilar atelectasis with a questionable degree of consolidation in the left base. Aortic Atherosclerosis (ICD10-I70.0). Electronically Signed   By: Lowella Grip III M.D.   On: 03/02/2018 07:38    I have reviewed the patient's current medications.  Assessment/Plan: 1 AKI Cr better, less acidemic . Severe vol xs , prohibiting extub 2 Vol overload 3 VDRF per Dr. Luan Pulling,  4 Colonic perf with peritontis s/p op, On AB 5 Anemia 6 Schock resolved P ^ Lasix, AB, Vent, limit vol.    LOS: 11 days   Regina Hall 03/03/2018,8:08 AM

## 2018-03-03 NOTE — Progress Notes (Signed)
PROGRESS NOTE  Regina Hall:[id number] DOB: 11-Feb-1934 DOA: 02/20/2018 PCP: [name], DO  Brief History: 84 year old female with hypertension, history of left leg DVT status post IVC filter presented to the hospital with abdominal pain. CT of the abdomen pelvis shows diverticulitis with large amount of stool throughout the colon and concern for underlying colonic obstruction. Underwent sigmoidoscopy showing obstruction in the sigmoid colon with marked edema. GI and surgery following. The patient subsequently developed a new fever. Blood and urine cultures were ordered. Repeat CT abd/pelvis ordered 2/13showed free air. Case was discussed with general surgery and plans to go to OR 2/14. Pt underwent a Hartman's procedure on 02/28/18. She was transferred to ICU on ventilator post-op.  Post-op, she was hypotensive and started on neosynephrine. She also developed new onset atrial fibrillation. She was started on amiodarone IV on 02/28/18.  She converted to sinus after ~5 hours.  She was kept on amiodarone due to continued critical illness and high risk of reverting back to afib.  She developed fluid overload due to fluid resuscitation and was started on furosemide  Assessment/Plan: Large bowel (sigmoid) obstruction -Underwent sigmoidoscopy showing obstruction in the sigmoid colon with marked edema. Surgery following and recommend initially non-operative tx  -2/7--Underwent sigmoidoscopy showing obstruction in the sigmoid colon with marked edema. -GI recommends outpatient colonoscopy in 6 weeks for full evaluation of the colon. -Sigmoidoscopy biopsy--tubular adenoma -02/27/18 CT abd--New small amount of free intraperitoneal air, consistent with bowel perforation -case discussed with general surgery, Dr.Bridges -2/14--Hartmann procedure  Acute diverticulitiswith perforation/peritonitis -cipro/flagyl d/ced -ertapenem started 2/13 -02/27/18 CT abd--New small  amount of free intraperitoneal air, consistent with bowel perforation -case discussed with general surgery, Dr.Bridges -2/14--Hartmann procedure -TPN/enteral feeding per general surgery -2/16--weaned off neosynephrine  New Onset Atrial Fibrillation -started amiodarone drip due to hypotension 02/28/18 -converted back to sinus after 4-5 hours on amio -continue amiodarone drip for now as pt continues to be critically due to high risk to reverting back to afib  Acute on chronic renal failure--CKD stage 3 -baseline creatinine 0.9-1.2 -nonoliguric -consult nephrology--appreciated -due to infectious process and hemodynamic changes -remains fluid overloaded -continue lasix IV per renal  Acute respiratory failure with hypoxia -due to hypoventilation, fluid overload, bronchospasm -appreciate pulmonary -wean to extubation -echo  ??Hematemesis -RN reported multiple episodes of rust/red/brown emesis -consult GI--noted input -continuePPI bid -no blood through OG  Bronchospasm/wheeze -continuesolumedrol--decrease to once daily -xopenex/atrovent -continue pulmicort -2/16--repeat CXR--personally reviewed--mild improvement in interstitial markings, poor inspiration  Chronic bilateral lower extremity edema Suspect secondary to venous insufficiency/hypoalbuminemia, fluid overload -continue IV lasix  Essential hypertension -Holding losartandue to hypotension and AKI  Physical deconditioning PT eval-->SNF -Reports ambulating with a walker due to scoliosis prior to hospitalization.    Disposition Plan:remain in ICU Family Communication:NoFamily at bedside  Consultants:General surgery, GI, renal  Code Status: FULL   DVT Prophylaxis: [place] Lovenox   Procedures: As Listed in Progress Note Above  Antibiotics: cipro2/7>>>2/13 Flagyl2/7>>>2/13 Ertapenem 2/13>>>>    The patient is critically ill with multiple organ systems failure and requires high  complexity decision making for assessment and support, frequent evaluation and titration of therapies, application of advanced monitoring technologies and extensive interpretation of multiple databases.  Critical care time - 35 mins.        Subjective: Pt is breathing better than yesterday.  Remains on vent.  Denies cp, vomiting.  C/o abd pain controlled.  Denies headache f/c  Objective: Vitals:   03/03/18  0221 03/03/18 0349 03/03/18 0400 03/03/18 0500  BP:      Pulse:      Resp:      Temp:   (!) 97 F (36.1 C)   TempSrc:   Axillary   SpO2: 99%     Weight:    118.6 kg  Height:  _0  (1.676 m)      Intake/Output Summary (Last 24 hours) at 03/03/2018 0815 Last data filed at 03/03/2018 0500 Gross per 24 hour  Intake 1662.77 ml  Output 1650 ml  Net 12.77 ml   Weight change: -0.4 kg Exam:   General:  Pt is alert, follows commands appropriately, not in acute distress; remains intubated  HEENT: No icterus, No thrush, [place]/AT  Cardiovascular: RRR, S1/S2, no rubs, no gallops  Respiratory: bibasilar crackles, no wheeze  Abdomen: Soft/+BS, diffusely tender, non distended, no guarding  Extremities: 2 +LE edema, No lymphangitis, No petechiae, No rashes, no synovitis   Data Reviewed: I have personally reviewed following labs and imaging studies Basic Metabolic Panel: Recent Labs  Lab 02/27/18 0552 02/28/18 0601 03/01/18 0419 03/02/18 0448 03/03/18 0445  NA 135 133* 134* 135 139  K 3.9 4.5 4.3 4.9 4.0  CL 102 96* 105 108 110  CO2 23 24 21* 18* 22  GLUCOSE 158* 100* 162* 147* 148*  BUN 17 31* 40* 47* 53*  CREATININE 1.21* 2.23* 1.95* 1.73* 1.53*  CALCIUM 8.5* 8.8* 7.7* 8.0* 8.3*  MG  --  2.5* 2.4  --   --   PHOS  --   --  4.0  --   --    Liver Function Tests: Recent Labs  Lab 02/27/18 0552 03/03/18 0445  AST 17 11*  ALT 11 9  ALKPHOS 55 33*  BILITOT 0.8 0.3  PROT 5.1* 4.3*  ALBUMIN 2.3* 1.8*   No results for input(s): LIPASE, AMYLASE in the last 168  hours. No results for input(s): AMMONIA in the last 168 hours. Coagulation Profile: No results for input(s): INR, PROTIME in the last 168 hours. CBC: Recent Labs  Lab 02/27/18 0552 02/28/18 0601 03/01/18 0419 03/02/18 0448 03/03/18 0445  WBC 10.1 26.2* 25.0* 18.3* 17.4*  HGB 13.1 13.1 10.4* 8.9* 8.4*  HCT 41.1 40.1 31.7* 27.3* 26.3*  MCV 93.8 93.9 94.3 92.5 92.6  PLT 332 328 367 247 222   Cardiac Enzymes: No results for input(s): CKTOTAL, CKMB, CKMBINDEX, TROPONINI in the last 168 hours. BNP: Invalid input(s): POCBNP CBG: Recent Labs  Lab 03/02/18 0348 03/02/18 0814 03/02/18 1136 03/02/18 2100 03/03/18 0026  GLUCAP 134* 137* 141* 142* 147*   HbA1C: No results for input(s): HGBA1C in the last 72 hours. Urine analysis:    Component Value Date/Time   COLORURINE YELLOW 02/26/2018 1712   APPEARANCEUR CLEAR 02/26/2018 1712   LABSPEC 1.014 02/26/2018 1712   PHURINE 8.0 02/26/2018 1712   GLUCOSEU NEGATIVE 02/26/2018 1712   HGBUR SMALL (A) 02/26/2018 1712   BILIRUBINUR NEGATIVE 02/26/2018 1712   KETONESUR NEGATIVE 02/26/2018 1712   PROTEINUR NEGATIVE 02/26/2018 1712   UROBILINOGEN 0.2 04/21/2014 1200   NITRITE NEGATIVE 02/26/2018 1712   LEUKOCYTESUR NEGATIVE 02/26/2018 1712   Sepsis Labs: _1 (procalcitonin:4,lacticidven:4) ) Recent Results (from the past 240 hour(s))  Culture, blood (Routine X 2) w Reflex to ID Panel     Status: None   Collection Time: 02/26/18  9:25 AM  Result Value Ref Range Status   Specimen Description LEFT ANTECUBITAL  Final   Special Requests   Final    BOTTLES  DRAWN AEROBIC AND ANAEROBIC Blood Culture adequate volume   Culture   Final    NO GROWTH 5 DAYS Performed at [facility], [address], [place], [place] [address]    Report Status 03/03/2018 FINAL  Final  Culture, blood (Routine X 2) w Reflex to ID Panel     Status: None   Collection Time: 02/26/18  9:33 AM  Result Value Ref Range Status   Specimen Description BLOOD  LEFT HAND  Final   Special Requests   Final    BOTTLES DRAWN AEROBIC AND ANAEROBIC Blood Culture adequate volume   Culture   Final    NO GROWTH 5 DAYS Performed at [facility], [address] [place], [place] [address]    Report Status 03/03/2018 FINAL  Final  Culture, Urine     Status: None   Collection Time: 02/26/18  5:12 PM  Result Value Ref Range Status   Specimen Description   Final    URINE, RANDOM Performed at [facility], [address] [place], [place] [address]    Special Requests   Final    NONE Performed at [facility], [address], [place], [place] [address]    Culture   Final    NO GROWTH Performed at [facility], [place] [address], [place], [place] [address]    Report Status 02/28/2018 FINAL  Final  Respiratory Panel by PCR     Status: None   Collection Time: 02/27/18  8:07 AM  Result Value Ref Range Status   Adenovirus NOT DETECTED NOT DETECTED Final   Coronavirus 229E NOT DETECTED NOT DETECTED Final    Comment: (NOTE) The Coronavirus on the Respiratory Panel, DOES NOT test for the novel  Coronavirus (2019 nCoV)    Coronavirus HKU1 NOT DETECTED NOT DETECTED Final   Coronavirus NL63 NOT DETECTED NOT DETECTED Final   Coronavirus OC43 NOT DETECTED NOT DETECTED Final   Metapneumovirus NOT DETECTED NOT DETECTED Final   Rhinovirus / Enterovirus NOT DETECTED NOT DETECTED Final   Influenza A NOT DETECTED NOT DETECTED Final   Influenza B NOT DETECTED NOT DETECTED Final   Parainfluenza Virus 1 NOT DETECTED NOT DETECTED Final   Parainfluenza Virus 2 NOT DETECTED NOT DETECTED Final   Parainfluenza Virus 3 NOT DETECTED NOT DETECTED Final   Parainfluenza Virus 4 NOT DETECTED NOT DETECTED Final   Respiratory Syncytial Virus NOT DETECTED NOT DETECTED Final   Bordetella pertussis NOT DETECTED NOT DETECTED Final   Chlamydophila pneumoniae NOT DETECTED NOT DETECTED Final   Mycoplasma pneumoniae NOT DETECTED NOT DETECTED Final    Comment: Performed at [facility], [place] [address], [place], [place] [address]  Surgical PCR screen     Status: None   Collection Time: 02/28/18  7:52 AM  Result Value Ref Range Status   MRSA, PCR NEGATIVE NEGATIVE Final   Staphylococcus aureus NEGATIVE NEGATIVE Final    Comment: (NOTE) The Xpert SA Assay (FDA approved for NASAL specimens in patients 29 years of age and older), is one component of a comprehensive surveillance program. It is not intended to diagnose infection nor to guide or monitor treatment. Performed at [facility], [address] [place],  [address]   Aerobic/Anaerobic Culture (surgical/deep wound)     Status: None (Preliminary result)   Collection Time: 02/28/18  2:27 PM  Result Value Ref Range Status   Specimen Description   Final    WOUND Performed at [facility], [address] [place], [place]  [address]    Special Requests   Final    ABSCESS Performed at [facility], [address], [place], [place] [address]    Gram Stain   Final    RARE WBC PRESENT, PREDOMINANTLY PMN RARE GRAM POSITIVE COCCI RARE GRAM VARIABLE ROD RARE GRAM POSITIVE RODS Performed at [facility], [place] [address], [place], [place] [address]    Culture   Final    FEW ENTEROCOCCUS FAECIUM SUSCEPTIBILITIES TO FOLLOW NO ANAEROBES ISOLATED; CULTURE IN PROGRESS FOR 5 DAYS    Report Status PENDING  Incomplete     Scheduled Meds: . budesonide (PULMICORT) nebulizer solution  0.5 mg Nebulization BID  . chlorhexidine gluconate (MEDLINE KIT)  15 mL Mouth Rinse BID  . Chlorhexidine Gluconate Cloth  6 each Topical Daily  . enoxaparin (LOVENOX) injection  40 mg Subcutaneous Q24H  . ipratropium  0.5 mg Nebulization Q6H  . levalbuterol  1.25 mg Nebulization Q6H  . mouth rinse  15 mL Mouth Rinse 10 times per day  . methylPREDNISolone (SOLU-MEDROL) injection  60 mg Intravenous Q24H  . mupirocin ointment  1 application Nasal BID  . pantoprazole (PROTONIX) IV  40 mg Intravenous Q12H  . sodium chloride flush   10-40 mL Intracatheter Q12H   Continuous Infusions: . sodium chloride 10 mL/hr at 03/03/18 0758  . sodium chloride    . albumin human    . amiodarone 30 mg/hr (03/02/18 2110)  . ertapenem Stopped (03/02/18 1753)  . furosemide    . phenylephrine (NEO-SYNEPHRINE) Adult infusion Stopped (03/02/18 1225)    Procedures/Studies: Ct Abdomen Pelvis Wo Contrast  Result Date: 02/27/2018 CLINICAL DATA:  Follow-up diverticulitis. Chronic abdominal pain. EXAM: CT ABDOMEN AND PELVIS WITHOUT CONTRAST TECHNIQUE: Multidetector CT imaging of the abdomen and pelvis was performed following the standard protocol without IV contrast. COMPARISON:  02/20/2018 FINDINGS: Lower chest: New small bilateral pleural effusions and bibasilar atelectasis. Hepatobiliary: Stable small cyst in left hepatic lobe. No mass visualized on this unenhanced exam. Tiny gallstones again noted, however there is no evidence of cholecystitis or biliary ductal dilatation. Pancreas: No mass or inflammatory process visualized on this unenhanced exam. Spleen:  Within normal limits in size. Adrenals/Urinary tract: No evidence of urolithiasis or hydronephrosis. Unremarkable unopacified urinary bladder. Stomach/Bowel: New small amount of free intraperitoneal air is seen, consistent with bowel perforation. Mild ascites is new since previous study, as well as diffuse mesenteric and body wall edema. Large amount of stool is again seen throughout the colon. A transition point is again seen near the rectosigmoid junction, consistent with stricture. No definite soft tissue mass appreciated by CT. Mild diverticulosis and wall thickening is seen involving the proximal sigmoid, which is similar to previous study and suspicious for mild diverticulitis. No evidence of small bowel wall thickening or obstruction. No abscess identified. Vascular/Lymphatic: No pathologically enlarged lymph nodes identified. No evidence of abdominal aortic aneurysm. Reproductive: Prior  hysterectomy. Limited visualization due to artifact from left hip prosthesis. Other:  None. Musculoskeletal:  No suspicious bone lesions identified. IMPRESSION: 1. New small amount of free intraperitoneal air, consistent with bowel perforation. 2. Large colonic stool burden with transition point near the rectosigmoid junction, consistent with stricture. No definite soft tissue mass appreciated by CT. This is unchanged in appearance compared to previous study. Recommend correlation with colonoscopy to exclude obstructing colon carcinoma. 3. Mild diverticulitis of proximal sigmoid colon, without significant change. 4. New mild ascites and diffuse body wall edema. New small bilateral pleural effusions and bibasilar atelectasis. 5. Cholelithiasis.  No radiographic evidence of cholecystitis. Critical Value/emergent results were called by telephone at the time of interpretation on 02/27/2018 at 4:35 pm to Dr. Shanon Brow Natosha Bou , who verbally acknowledged these results. Electronically Signed   By: Earle Gell M.D.   On: 02/27/2018 16:38   Ct Abdomen Pelvis W Contrast  Result Date: 02/20/2018 CLINICAL DATA:  Lower abdominal pain and constipation for the past 2 days. Clinical suspicion for diverticulitis. EXAM: CT ABDOMEN AND PELVIS WITH CONTRAST TECHNIQUE: Multidetector CT imaging of the abdomen and pelvis was performed using the standard protocol following bolus administration of intravenous contrast. CONTRAST:  139m OMNIPAQUE IOHEXOL 300 MG/ML  SOLN COMPARISON:  Abdomen radiographs dated 02/06/2018. Abdomen and pelvis CT dated 04/08/2015. FINDINGS: Lower chest: Unremarkable. Hepatobiliary: No significant change in previously demonstrated liver cysts. Small number of tiny gallstones in the gallbladder measuring up to 4 mm in maximum diameter each. No gallbladder wall thickening or pericholecystic fluid. Pancreas: Unremarkable. No pancreatic ductal dilatation or surrounding inflammatory changes. Spleen: 5 mm oval area of low  density in the spleen on image number 29 series 2, difficult to detect with certainty on the previous examination, partly due to differences in technique and timing. Adrenals/Urinary Tract: Normal appearing adrenal glands. Stable tiny exophytic upper pole left renal cyst. Normal appearing right kidney, ureters and urinary bladder. Stomach/Bowel: Large amount of stool throughout the colon to the level of the distal sigmoid colon where there is a short segment of concentric, medium density wall thickening, best seen on image number 72 series 2. The wall thickening is concentric and lower in density on coronal image number 90. Lesser amount of stool and gas in the normal caliber rectum. Minimal sigmoid colon diverticulosis. Mild pericolonic soft tissue stranding involving the distal sigmoid colon, most pronounced at the level of maximal distension of the colon by the stool. Normal appearing stomach, small bowel and appendix. Vascular/Lymphatic: Atheromatous arterial calcifications without aneurysm. No enlarged lymph nodes. Inferior vena cava filter with its tip just above the level of the renal veins. Reproductive: Status post hysterectomy. No adnexal masses. Other: Small amount of free peritoneal fluid in the pelvis. Musculoskeletal: Left hip prosthesis. Severe right hip degenerative changes with severe joint space narrowing, extensive subarticular cyst formation, spur formation and bony remodeling. Moderate levoconvex thoracolumbar scoliosis. Interbody and pedicle screw and rod fusion at the L4-5 level. Fusion of portions of the T12 and L1 vertebral bodies. Lumbar and lower thoracic spine degenerative changes. IMPRESSION: 1. Large amount of stool throughout the colon to the level of the distal sigmoid colon where there is a short segment of concentric, medium density wall thickening. This is concerning for an obstructing short segment mass. It would be unusual for edema due to diverticulitis to involve that short of  a segment of bowel. Correlation with sigmoidoscopy or colonoscopy is recommended. 2. Mild pericolonic soft tissue stranding involving the distal sigmoid colon, most pronounced at the level of maximal distension of the colon by the stool. This is suspicious for mild diverticulitis without abscess. There is minimal diverticulosis in that region of the colon. 3. Cholelithiasis. 4. 5 mm probable hemangioma in the spleen. Electronically Signed   By: SClaudie ReveringM.D.   On: 02/20/2018 20:59   Dg Chest Port 1 View  Result Date: 03/02/2018 CLINICAL DATA:  Hypoxia EXAM: PORTABLE CHEST 1 VIEW COMPARISON:  March 01, 2018 FINDINGS: Endotracheal tube tip is 3.7 cm above the carina. Nasogastric tube tip and side port are in the stomach. Right subclavian catheter tip  is at the cavoatrial junction. No pneumothorax. There is cardiomegaly with pulmonary venous hypertension. There are small pleural effusions bilaterally. There is atelectatic change in the lung bases. A degree of consolidation in the left base is questioned. There is advanced arthropathy in the left shoulder. IMPRESSION: Tube and catheter positions as described without pneumothorax. Pulmonary vascular congestion present with small pleural effusions bilaterally. There is bibasilar atelectasis with a questionable degree of consolidation in the left base. Aortic Atherosclerosis (ICD10-I70.0). Electronically Signed   By: Lowella Grip III M.D.   On: 03/02/2018 07:38   Dg Chest Port 1 View  Result Date: 03/01/2018 CLINICAL DATA:  Ventilator dependent respiratory failure. Follow-up pulmonary edema. EXAM: PORTABLE CHEST 1 VIEW COMPARISON:  02/28/2018, 02/28/2016 and earlier. FINDINGS: Endotracheal tube tip in satisfactory position projecting approximately 3-4 cm above the carina. RIGHT arm PICC tip projects at or near the cavoatrial junction, unchanged. Nasogastric tube looped in the stomach with its tip in the fundus. Markedly suboptimal inspiration with  worsening atelectasis in the lung bases, LEFT greater than RIGHT. Resolution of interstitial pulmonary edema. Persistent pulmonary venous hypertension. IMPRESSION: 1.  Support apparatus satisfactory. 2. Resolution of interstitial pulmonary edema since yesterday, though pulmonary venous hypertension persists. 3. Worsening bibasilar atelectasis, LEFT greater than RIGHT. Electronically Signed   By: Evangeline Dakin M.D.   On: 03/01/2018 09:01   Dg Chest Port 1 View  Result Date: 02/28/2018 CLINICAL DATA:  Intubation. EXAM: PORTABLE CHEST 1 VIEW COMPARISON:  02/26/2018 FINDINGS: The endotracheal tube is 3.3 cm above the carina. There is an NG tube coursing down the esophagus and into the stomach. The right-sided PICC line is stable. Significant worsening lung aeration with very low lung volumes, vascular crowding, pulmonary edema and basilar atelectasis. The heart remains enlarged and there is tortuosity and calcification of the thoracic aorta. IMPRESSION: 1. The endotracheal tube and NG tubes are in good position. 2. Very low lung volumes with vascular crowding and atelectasis 3. Vascular congestion and pulmonary edema. Electronically Signed   By: Marijo Sanes M.D.   On: 02/28/2018 16:37   Dg Chest Port 1 View  Result Date: 02/26/2018 CLINICAL DATA:  84 year old female with intermittent productive cough EXAM: PORTABLE CHEST 1 VIEW COMPARISON:  02/28/2016 FINDINGS: Cardiomediastinal silhouette unchanged. Interval development of asymmetric elevation the right hemidiaphragm. No pneumothorax. Low lung volumes with coarsened interstitial markings and interlobular septal thickening. No large pleural effusion. No confluent airspace disease. Interval placement of right upper extremity PICC with the tip appearing to terminate superior vena cava. Degenerative changes of the left glenohumeral joint. IMPRESSION: Low lung volumes with questionable edema. New asymmetric elevation the right hemidiaphragm, uncertain  significance. Right upper extremity PICC. Electronically Signed   By: Corrie Mckusick D.O.   On: 02/26/2018 19:28   Dg Abd 2 Views  Result Date: 02/06/2018 CLINICAL DATA:  IVC filter placement evaluation. EXAM: ABDOMEN - 2 VIEW COMPARISON:  03/29/2017. FINDINGS: Lumbar spine numbered as per prior exam. IVC filter noted with tip at approximately the L1 level. Degenerative changes and scoliosis thoracic spine. L4-L5 fusion. Rounded calcific densities noted over the right upper quadrant, these could represent gallstones, kidney stones, undigested pill fragments. No bowel distention or free air. Stool noted throughout the colon and rectum. IMPRESSION: IVC filter noted with proximal tip at the L1 level. Electronically Signed   By: Marcello Moores  Register   On: 02/06/2018 14:21   [place] Ekg Site Rite  Result Date: 02/22/2018 If Site Rite image not attached, placement could not be confirmed  due to current cardiac rhythm.   Orson Eva, DO  [facility] Pager [phone]  If 7PM-7AM, please contact night-coverage [email or web address] Password [email or web address] 03/03/2018, 8:15 AM   LOS: 11 days

## 2018-03-04 ENCOUNTER — Inpatient Hospital Stay (HOSPITAL_COMMUNITY): Payer: Medicare Other

## 2018-03-04 LAB — CBC
HCT: 29.6 % — ABNORMAL LOW (ref 36.0–46.0)
Hemoglobin: 9.5 g/dL — ABNORMAL LOW (ref 12.0–15.0)
MCH: 29.4 pg (ref 26.0–34.0)
MCHC: 32.1 g/dL (ref 30.0–36.0)
MCV: 91.6 fL (ref 80.0–100.0)
Platelets: 279 10*3/uL (ref 150–400)
RBC: 3.23 MIL/uL — ABNORMAL LOW (ref 3.87–5.11)
RDW: 14.4 % (ref 11.5–15.5)
WBC: 20.3 10*3/uL — ABNORMAL HIGH (ref 4.0–10.5)
nRBC: 0.2 % (ref 0.0–0.2)

## 2018-03-04 LAB — COMPREHENSIVE METABOLIC PANEL
ALT: 8 U/L (ref 0–44)
AST: 11 U/L — ABNORMAL LOW (ref 15–41)
Albumin: 3.2 g/dL — ABNORMAL LOW (ref 3.5–5.0)
Alkaline Phosphatase: 33 U/L — ABNORMAL LOW (ref 38–126)
Anion gap: 11 (ref 5–15)
BUN: 55 mg/dL — ABNORMAL HIGH (ref 8–23)
CALCIUM: 9 mg/dL (ref 8.9–10.3)
CO2: 24 mmol/L (ref 22–32)
CREATININE: 1.37 mg/dL — AB (ref 0.44–1.00)
Chloride: 106 mmol/L (ref 98–111)
GFR calc non Af Amer: 36 mL/min — ABNORMAL LOW (ref >60)
GFR, EST AFRICAN AMERICAN: 42 mL/min — AB (ref >60)
Glucose, Bld: 127 mg/dL — ABNORMAL HIGH (ref 70–99)
Potassium: 3.4 mmol/L — ABNORMAL LOW (ref 3.5–5.1)
SODIUM: 141 mmol/L (ref 135–145)
Total Bilirubin: 0.6 mg/dL (ref 0.3–1.2)
Total Protein: 5.6 g/dL — ABNORMAL LOW (ref 6.5–8.1)

## 2018-03-04 LAB — BLOOD GAS, ARTERIAL
ACID-BASE EXCESS: 1 mmol/L (ref 0.0–2.0)
Bicarbonate: 25.3 mmol/L (ref 20.0–28.0)
FIO2: 40
MECHVT: 540 mL
O2 Saturation: 96.8 %
PEEP: 5 cmH2O
PO2 ART: 89.9 mmHg (ref 83.0–108.0)
Patient temperature: 37
RATE: 14 resp/min
pCO2 arterial: 40 mmHg (ref 32.0–48.0)
pH, Arterial: 7.413 (ref 7.350–7.450)

## 2018-03-04 LAB — RENAL FUNCTION PANEL
ANION GAP: 11 (ref 5–15)
Albumin: 3.3 g/dL — ABNORMAL LOW (ref 3.5–5.0)
BUN: 54 mg/dL — AB (ref 8–23)
CO2: 23 mmol/L (ref 22–32)
Calcium: 8.9 mg/dL (ref 8.9–10.3)
Chloride: 106 mmol/L (ref 98–111)
Creatinine, Ser: 1.35 mg/dL — ABNORMAL HIGH (ref 0.44–1.00)
GFR calc Af Amer: 43 mL/min — ABNORMAL LOW (ref >60)
GFR calc non Af Amer: 37 mL/min — ABNORMAL LOW (ref >60)
Glucose, Bld: 126 mg/dL — ABNORMAL HIGH (ref 70–99)
POTASSIUM: 3.3 mmol/L — AB (ref 3.5–5.1)
Phosphorus: 3.6 mg/dL (ref 2.5–4.6)
Sodium: 140 mmol/L (ref 135–145)

## 2018-03-04 LAB — GLUCOSE, CAPILLARY
GLUCOSE-CAPILLARY: 135 mg/dL — AB (ref 70–99)
Glucose-Capillary: 130 mg/dL — ABNORMAL HIGH (ref 70–99)
Glucose-Capillary: 139 mg/dL — ABNORMAL HIGH (ref 70–99)
Glucose-Capillary: 170 mg/dL — ABNORMAL HIGH (ref 70–99)
Glucose-Capillary: 161 mg/dL — ABNORMAL HIGH (ref 70–99)
Glucose-Capillary: 188 mg/dL — ABNORMAL HIGH (ref 70–99)

## 2018-03-04 MED ORDER — POTASSIUM CHLORIDE 10 MEQ/50ML IV SOLN
10.0000 meq | INTRAVENOUS | Status: DC
  Filled 2018-03-04 (×4): qty 50

## 2018-03-04 MED ORDER — POTASSIUM CHLORIDE 10 MEQ/100ML IV SOLN
10.0000 meq | INTRAVENOUS | Status: AC
  Administered 2018-03-04 (×4): 10 meq via INTRAVENOUS
  Filled 2018-03-04 (×4): qty 100

## 2018-03-04 MED ORDER — FUROSEMIDE 10 MG/ML IJ SOLN
160.0000 mg | Freq: Two times a day (BID) | INTRAVENOUS | Status: DC
  Administered 2018-03-04 – 2018-03-05 (×2): 160 mg via INTRAVENOUS
  Filled 2018-03-04 (×4): qty 16

## 2018-03-04 MED FILL — Phenylephrine HCl IV Soln 10 MG/ML: INTRAVENOUS | Qty: 2 | Status: AC

## 2018-03-04 MED FILL — Sodium Chloride IV Soln 0.9%: INTRAVENOUS | Qty: 250 | Status: AC

## 2018-03-04 NOTE — Progress Notes (Signed)
4 Days Post-Op  Subjective: Intubated, easily arousable.  Is on weaning protocol.  Objective: Vital signs in last 24 hours: Temp:  [97.3 F (36.3 C)-99.3 F (37.4 C)] 97.9 F (36.6 C) (02/18 0811) Pulse Rate:  [64-89] 89 (02/18 0600) Resp:  [14-19] 17 (02/18 0600) BP: (116-141)/(55-82) 141/82 (02/18 0600) SpO2:  [96 %-100 %] 96 % (02/18 1006) Arterial Line BP: (135-144)/(129-141) 135/129 (02/17 2100) FiO2 (%):  [40 %] 40 % (02/18 0731) Weight:  [113.5 kg] 113.5 kg (02/18 0500) Last BM Date: 02/23/18  Intake/Output from previous day: 02/17 0701 - 02/18 0700 In: 1584.1 [I.V.:762.7; IV Piggyback:821.4] Out: 2297 [Urine:5350] Intake/Output this shift: No intake/output data recorded.  General appearance: alert, cooperative and no distress GI: Soft, incision with some cloudy yellow drainage.  Packing in place.  Ostomy pink and patent.  Lab Results:  Recent Labs    03/03/18 0445 03/04/18 0618  WBC 17.4* 20.3*  HGB 8.4* 9.5*  HCT 26.3* 29.6*  PLT 222 279   BMET Recent Labs    03/03/18 0445 03/04/18 0618  NA 139 140  141  K 4.0 3.3*  3.4*  CL 110 106  106  CO2 22 23  24   GLUCOSE 148* 126*  127*  BUN 53* 54*  55*  CREATININE 1.53* 1.35*  1.37*  CALCIUM 8.3* 8.9  9.0   PT/INR No results for input(s): LABPROT, INR in the last 72 hours.  Studies/Results: Dg Chest Port 1 View  Result Date: 03/04/2018 CLINICAL DATA:  Pulmonary edema. EXAM: PORTABLE CHEST 1 VIEW COMPARISON:  03/03/2018 FINDINGS: 0538 hours. Endotracheal tube tip is 5.1 cm above the base of the carina. NG tube passes into the stomach. Right PICC line tip overlies the distal SVC level. Lung volumes are low. The cardio pericardial silhouette is enlarged. There is pulmonary vascular congestion without overt pulmonary edema. Basilar atelectasis noted bilaterally with tiny bilateral pleural effusions. IMPRESSION: Low lung volumes with cardiomegaly, vascular congestion and tiny effusions. Electronically  Signed   By: Misty Stanley M.D.   On: 03/04/2018 08:39   Dg Chest Port 1 View  Result Date: 03/03/2018 CLINICAL DATA:  Pulmonary edema. EXAM: PORTABLE CHEST 1 VIEW COMPARISON:  Radiograph of March 02, 2018. FINDINGS: Stable cardiomediastinal silhouette. Atherosclerosis of thoracic aorta is noted. Endotracheal and nasogastric tubes are unchanged in position. Right-sided PICC line is unchanged in position. Stable hypoinflation of the lungs is noted with associated bibasilar atelectasis and small pleural effusions. No pneumothorax is noted. Severe degenerative changes seen involving the left glenohumeral joint. IMPRESSION: Stable support apparatus. Stable hypoinflation of the lungs with mild bibasilar subsegmental atelectasis and small pleural effusions. Aortic Atherosclerosis (ICD10-I70.0). Electronically Signed   By: Marijo Conception, M.D.   On: 03/03/2018 08:27    Anti-infectives: Anti-infectives (From admission, onward)   Start     Dose/Rate Route Frequency Ordered Stop   03/03/18 1700  meropenem (MERREM) 1 g in sodium chloride 0.9 % 100 mL IVPB  Status:  Discontinued     1 g 200 mL/hr over 30 Minutes Intravenous Every 12 hours 03/03/18 1000 03/03/18 1507   03/03/18 1700  ertapenem (INVANZ) 1,000 mg in sodium chloride 0.9 % 100 mL IVPB     1 g 200 mL/hr over 30 Minutes Intravenous Every 24 hours 03/03/18 1507     03/03/18 1600  linezolid (ZYVOX) IVPB 600 mg     600 mg 300 mL/hr over 60 Minutes Intravenous Every 12 hours 03/03/18 1507     03/02/18 1800  ertapenem (  INVANZ) 1,000 mg in sodium chloride 0.9 % 100 mL IVPB  Status:  Discontinued     1 g 200 mL/hr over 30 Minutes Intravenous Every 24 hours 03/02/18 0829 03/03/18 0959   02/28/18 1800  ertapenem (INVANZ) 500 mg in sodium chloride 0.9 % 50 mL IVPB  Status:  Discontinued     500 mg 100 mL/hr over 30 Minutes Intravenous Every 24 hours 02/28/18 1035 03/02/18 0829   02/27/18 1800  ertapenem (INVANZ) 1,000 mg in sodium chloride 0.9 % 100  mL IVPB  Status:  Discontinued     1 g 200 mL/hr over 30 Minutes Intravenous Every 24 hours 02/27/18 1705 02/28/18 1035   02/21/18 0800  ciprofloxacin (CIPRO) IVPB 400 mg  Status:  Discontinued     400 mg 200 mL/hr over 60 Minutes Intravenous Every 12 hours 02/20/18 2204 02/27/18 1908   02/21/18 0500  metroNIDAZOLE (FLAGYL) IVPB 500 mg  Status:  Discontinued     500 mg 100 mL/hr over 60 Minutes Intravenous Every 8 hours 02/20/18 2204 02/27/18 1908   02/20/18 2115  ciprofloxacin (CIPRO) IVPB 400 mg     400 mg 200 mL/hr over 60 Minutes Intravenous  Once 02/20/18 2113 02/20/18 2335   02/20/18 2115  metroNIDAZOLE (FLAGYL) tablet 500 mg     500 mg Oral  Once 02/20/18 2113 02/20/18 2147      Assessment/Plan: s/p Procedure(s): PARTIAL COLECTOMY WITH COLOSTOMY Impression: Continues to slowly improve on postoperative day 4.  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  Antibiotics have been adjusted appropriately.  Anticipate extubation this morning.  May start clear liquid diet once extubated.  If white blood cell count continues to rise, may need to cover for intra-abdominal fungal infection.  LOS: 12 days    [name] 03/04/2018

## 2018-03-04 NOTE — Care Management Note (Signed)
Case Management Note  Patient Details  Name: Regina Hall MRN: [id number] Date of Birth: 05-30-1934   If discussed at Long Length of Stay Meetings, dates discussed:  03/04/18  Additional Comments:  Obie Silos, Chauncey Reading, RN 03/04/2018, 2:23 PM

## 2018-03-04 NOTE — Progress Notes (Signed)
Subjective: Interval History: still on vent , hemodynamics stable, diuresing with higher dose Lasix.  Objective: Vital signs in last 24 hours: Temp:  [97.3 F (36.3 C)-99.3 F (37.4 C)] 97.9 F (36.6 C) (02/18 0811) Pulse Rate:  [64-89] 89 (02/18 0600) Resp:  [14-19] 17 (02/18 0600) BP: (116-141)/(55-82) 141/82 (02/18 0600) SpO2:  [98 %-100 %] 98 % (02/18 0731) Arterial Line BP: (135-144)/(129-141) 135/129 (02/17 2100) FiO2 (%):  [40 %] 40 % (02/18 0731) Weight:  [113.5 kg] 113.5 kg (02/18 0500) Weight change: -5.1 kg  Intake/Output from previous day: 02/17 0701 - 02/18 0700 In: 1584.1 [I.V.:762.7; IV Piggyback:821.4] Out: 2751 [Urine:5350] Intake/Output this shift: No intake/output data recorded.  General appearance: cooperative, moderately obese, pale and entubated,  Resp: diminished breath sounds bilaterally and rales bibasilar Cardio: regular rate and rhythm and systolic murmur: holosystolic 2/6, blowing at apex GI: mod distension, NG, some bs, incision lower abdm, ostomy RLQ Extremities: edema 4+  Lab Results: Recent Labs    03/03/18 0445 03/04/18 0618  WBC 17.4* 20.3*  HGB 8.4* 9.5*  HCT 26.3* 29.6*  PLT 222 279   BMET:  Recent Labs    03/03/18 0445 03/04/18 0618  NA 139 140  141  K 4.0 3.3*  3.4*  CL 110 106  106  CO2 22 23  24   GLUCOSE 148* 126*  127*  BUN 53* 54*  55*  CREATININE 1.53* 1.35*  1.37*  CALCIUM 8.3* 8.9  9.0   No results for input(s): PTH in the last 72 hours. Iron Studies: No results for input(s): IRON, TIBC, TRANSFERRIN, FERRITIN in the last 72 hours.  Studies/Results: Dg Chest Port 1 View  Result Date: 03/04/2018 CLINICAL DATA:  Pulmonary edema. EXAM: PORTABLE CHEST 1 VIEW COMPARISON:  03/03/2018 FINDINGS: 0538 hours. Endotracheal tube tip is 5.1 cm above the base of the carina. NG tube passes into the stomach. Right PICC line tip overlies the distal SVC level. Lung volumes are low. The cardio pericardial silhouette is  enlarged. There is pulmonary vascular congestion without overt pulmonary edema. Basilar atelectasis noted bilaterally with tiny bilateral pleural effusions. IMPRESSION: Low lung volumes with cardiomegaly, vascular congestion and tiny effusions. Electronically Signed   By: Misty Stanley M.D.   On: 03/04/2018 08:39   Dg Chest Port 1 View  Result Date: 03/03/2018 CLINICAL DATA:  Pulmonary edema. EXAM: PORTABLE CHEST 1 VIEW COMPARISON:  Radiograph of March 02, 2018. FINDINGS: Stable cardiomediastinal silhouette. Atherosclerosis of thoracic aorta is noted. Endotracheal and nasogastric tubes are unchanged in position. Right-sided PICC line is unchanged in position. Stable hypoinflation of the lungs is noted with associated bibasilar atelectasis and small pleural effusions. No pneumothorax is noted. Severe degenerative changes seen involving the left glenohumeral joint. IMPRESSION: Stable support apparatus. Stable hypoinflation of the lungs with mild bibasilar subsegmental atelectasis and small pleural effusions. Aortic Atherosclerosis (ICD10-I70.0). Electronically Signed   By: Marijo Conception, M.D.   On: 03/03/2018 08:27    I have reviewed the patient's current medications.  Assessment/Plan: 1 AKI resolving, diuresing with recovery of function , lasix.  K low will replete. 2 VDRF per Dr.Hawkins 3 Postop colon resx, perf, with peritonitis per surgery 4 Anemia 5 SVT sinus now and rate controlled P lower Lasix, Give KCl, vent per Pulm, cont AB, Surgical management.    LOS: 12 days   Jeneen Rinks Dorell Gatlin 03/04/2018,8:58 AM

## 2018-03-04 NOTE — Care Management Important Message (Signed)
Important Message  Patient Details  Name: Regina Hall MRN: [id number] Date of Birth: 1934/09/08   Medicare Important Message Given:  Yes    Joelene Barriere, Chauncey Reading, RN 03/04/2018, 8:17 AM

## 2018-03-04 NOTE — Progress Notes (Signed)
Pt extubated to 4L [place]

## 2018-03-04 NOTE — Progress Notes (Signed)
Subjective: She remains intubated and on the ventilator.  She is about 4 L negative yesterday after receiving Lasix and albumin.  Her albumin is above 3 now.  She has much less third spacing of fluid.  She is awake and alert on the ventilator.  Objective: Vital signs in last 24 hours: Temp:  [97.3 F (36.3 C)-99.3 F (37.4 C)] 97.9 F (36.6 C) (02/18 0811) Pulse Rate:  [64-89] 89 (02/18 0600) Resp:  [14-19] 17 (02/18 0600) BP: (116-141)/(55-82) 141/82 (02/18 0600) SpO2:  [98 %-100 %] 98 % (02/18 0731) Arterial Line BP: (135-144)/(129-141) 135/129 (02/17 2100) FiO2 (%):  [40 %] 40 % (02/18 0731) Weight:  [113.5 kg] 113.5 kg (02/18 0500) Weight change: -5.1 kg Last BM Date: 02/23/18  Intake/Output from previous day: 02/17 0701 - 02/18 0700 In: 1584.1 [I.V.:762.7; IV Piggyback:821.4] Out: 5350 [Urine:5350]  PHYSICAL EXAM General appearance: alert, cooperative and mild distress Resp: rhonchi bilaterally Cardio: regular rate and rhythm, S1, S2 normal, no murmur, click, rub or gallop GI: soft, non-tender; bowel sounds normal; no masses,  no organomegaly Extremities: Significant third spacing of fluid  Lab Results:  Results for orders placed or performed during the hospital encounter of 02/20/18 (from the past 48 hour(s))  Glucose, capillary     Status: Abnormal   Collection Time: 03/02/18 11:36 AM  Result Value Ref Range   Glucose-Capillary 141 (H) 70 - 99 mg/dL  Glucose, capillary     Status: Abnormal   Collection Time: 03/02/18  9:00 PM  Result Value Ref Range   Glucose-Capillary 142 (H) 70 - 99 mg/dL   Comment 1 Notify RN    Comment 2 Document in Chart   Glucose, capillary     Status: Abnormal   Collection Time: 03/03/18 12:26 AM  Result Value Ref Range   Glucose-Capillary 147 (H) 70 - 99 mg/dL   Comment 1 Notify RN    Comment 2 Document in Chart   Blood gas, arterial     Status: Abnormal   Collection Time: 03/03/18  4:15 AM  Result Value Ref Range   FIO2 40.00     pH, Arterial 7.386 7.350 - 7.450   pCO2 arterial 35.9 32.0 - 48.0 mmHg   pO2, Arterial 84.2 83.0 - 108.0 mmHg   Bicarbonate 21.9 20.0 - 28.0 mmol/L   Acid-base deficit 3.1 (H) 0.0 - 2.0 mmol/L   O2 Saturation 96.0 %   Patient temperature 37.0    Allens test (pass/fail) PASS PASS    Comment: Performed at [facility], [address] [place], [place] [address]  Comprehensive metabolic panel     Status: Abnormal   Collection Time: 03/03/18  4:45 AM  Result Value Ref Range   Sodium 139 135 - 145 mmol/L   Potassium 4.0 3.5 - 5.1 mmol/L    Comment: DELTA CHECK NOTED   Chloride 110 98 - 111 mmol/L   CO2 22 22 - 32 mmol/L   Glucose, Bld 148 (H) 70 - 99 mg/dL   BUN 53 (H) 8 - 23 mg/dL   Creatinine, Ser 1.53 (H) 0.44 - 1.00 mg/dL   Calcium 8.3 (L) 8.9 - 10.3 mg/dL   Total Protein 4.3 (L) 6.5 - 8.1 g/dL   Albumin 1.8 (L) 3.5 - 5.0 g/dL   AST 11 (L) 15 - 41 U/L   ALT 9 0 - 44 U/L   Alkaline Phosphatase 33 (L) 38 - 126 U/L   Total Bilirubin 0.3 0.3 - 1.2 mg/dL   GFR calc non Af  Amer 32 (L) >60 mL/min   GFR calc Af Amer 37 (L) >60 mL/min   Anion gap 7 5 - 15    Comment: Performed at [facility], [address], [place], [place] [address]  CBC     Status: Abnormal   Collection Time: 03/03/18  4:45 AM  Result Value Ref Range   WBC 17.4 (H) 4.0 - 10.5 K/uL   RBC 2.84 (L) 3.87 - 5.11 MIL/uL   Hemoglobin 8.4 (L) 12.0 - 15.0 g/dL   HCT 26.3 (L) 36.0 - 46.0 %   MCV 92.6 80.0 - 100.0 fL   MCH 29.6 26.0 - 34.0 pg   MCHC 31.9 30.0 - 36.0 g/dL   RDW 14.0 11.5 - 15.5 %   Platelets 222 150 - 400 K/uL   nRBC 0.1 0.0 - 0.2 %    Comment: Performed at [facility], [address] [place], [place] [address]  Glucose, capillary     Status: Abnormal   Collection Time: 03/03/18  8:15 AM  Result Value Ref Range   Glucose-Capillary 124 (H) 70 - 99 mg/dL  Glucose, capillary     Status: Abnormal   Collection Time: 03/03/18 11:32 AM  Result Value Ref Range   Glucose-Capillary 133 (H) 70 - 99 mg/dL   Glucose, capillary     Status: Abnormal   Collection Time: 03/03/18  5:03 PM  Result Value Ref Range   Glucose-Capillary 117 (H) 70 - 99 mg/dL  Glucose, capillary     Status: Abnormal   Collection Time: 03/03/18  7:46 PM  Result Value Ref Range   Glucose-Capillary 139 (H) 70 - 99 mg/dL  Glucose, capillary     Status: Abnormal   Collection Time: 03/04/18 12:39 AM  Result Value Ref Range   Glucose-Capillary 135 (H) 70 - 99 mg/dL  Glucose, capillary     Status: Abnormal   Collection Time: 03/04/18  5:04 AM  Result Value Ref Range   Glucose-Capillary 130 (H) 70 - 99 mg/dL  Blood gas, arterial     Status: None   Collection Time: 03/04/18  6:10 AM  Result Value Ref Range   FIO2 40.00    Delivery systems VENTILATOR    Mode PRESSURE REGULATED VOLUME CONTROL    VT 540 mL   LHR 14.0 resp/min   Peep/cpap 5.0 cm H20   pH, Arterial 7.413 7.350 - 7.450   pCO2 arterial 40.0 32.0 - 48.0 mmHg   pO2, Arterial 89.9 83.0 - 108.0 mmHg   Bicarbonate 25.3 20.0 - 28.0 mmol/L   Acid-Base Excess 1.0 0.0 - 2.0 mmol/L   O2 Saturation 96.8 %   Patient temperature 37.0    Allens test (pass/fail) PASS PASS    Comment: Performed at [facility], [address] [place], [place] [address]  Comprehensive metabolic panel     Status: Abnormal   Collection Time: 03/04/18  6:18 AM  Result Value Ref Range   Sodium 141 135 - 145 mmol/L   Potassium 3.4 (L) 3.5 - 5.1 mmol/L   Chloride 106 98 - 111 mmol/L   CO2 24 22 - 32 mmol/L   Glucose, Bld 127 (H) 70 - 99 mg/dL   BUN 55 (H) 8 - 23 mg/dL   Creatinine, Ser 1.37 (H) 0.44 - 1.00 mg/dL   Calcium 9.0 8.9 - 10.3 mg/dL   Total Protein 5.6 (L) 6.5 - 8.1 g/dL   Albumin 3.2 (L) 3.5 - 5.0 g/dL   AST 11 (L) 15 - 41 U/L  ALT 8 0 - 44 U/L   Alkaline Phosphatase 33 (L) 38 - 126 U/L   Total Bilirubin 0.6 0.3 - 1.2 mg/dL   GFR calc non Af Amer 36 (L) >60 mL/min   GFR calc Af Amer 42 (L) >60 mL/min   Anion gap 11 5 - 15    Comment: Performed at [facility],  [address] [place], [place] [address]  Renal function panel     Status: Abnormal   Collection Time: 03/04/18  6:18 AM  Result Value Ref Range   Sodium 140 135 - 145 mmol/L   Potassium 3.3 (L) 3.5 - 5.1 mmol/L   Chloride 106 98 - 111 mmol/L   CO2 23 22 - 32 mmol/L   Glucose, Bld 126 (H) 70 - 99 mg/dL   BUN 54 (H) 8 - 23 mg/dL   Creatinine, Ser 1.35 (H) 0.44 - 1.00 mg/dL   Calcium 8.9 8.9 - 10.3 mg/dL   Phosphorus 3.6 2.5 - 4.6 mg/dL   Albumin 3.3 (L) 3.5 - 5.0 g/dL   GFR calc non Af Amer 37 (L) >60 mL/min   GFR calc Af Amer 43 (L) >60 mL/min   Anion gap 11 5 - 15    Comment: Performed at [facility], [address], [place], [place] [address]  CBC     Status: Abnormal   Collection Time: 03/04/18  6:18 AM  Result Value Ref Range   WBC 20.3 (H) 4.0 - 10.5 K/uL   RBC 3.23 (L) 3.87 - 5.11 MIL/uL   Hemoglobin 9.5 (L) 12.0 - 15.0 g/dL   HCT 29.6 (L) 36.0 - 46.0 %   MCV 91.6 80.0 - 100.0 fL   MCH 29.4 26.0 - 34.0 pg   MCHC 32.1 30.0 - 36.0 g/dL   RDW 14.4 11.5 - 15.5 %   Platelets 279 150 - 400 K/uL   nRBC 0.2 0.0 - 0.2 %    Comment: Performed at [facility], [address] [place], [place] [address]  Glucose, capillary     Status: Abnormal   Collection Time: 03/04/18  8:13 AM  Result Value Ref Range   Glucose-Capillary 139 (H) 70 - 99 mg/dL    ABGS Recent Labs    03/04/18 0610  PHART 7.413  PO2ART 89.9  HCO3 25.3   CULTURES Recent Results (from the past 240 hour(s))  Culture, blood (Routine X 2) w Reflex to ID Panel     Status: None   Collection Time: 02/26/18  9:25 AM  Result Value Ref Range Status   Specimen Description LEFT ANTECUBITAL  Final   Special Requests   Final    BOTTLES DRAWN AEROBIC AND ANAEROBIC Blood Culture adequate volume   Culture   Final    NO GROWTH 5 DAYS Performed at [facility], [address], [place], [place] [address]    Report Status 03/03/2018 FINAL  Final  Culture, blood (Routine X 2) w Reflex to ID Panel     Status: None   Collection  Time: 02/26/18  9:33 AM  Result Value Ref Range Status   Specimen Description BLOOD LEFT HAND  Final   Special Requests   Final    BOTTLES DRAWN AEROBIC AND ANAEROBIC Blood Culture adequate volume   Culture   Final    NO GROWTH 5 DAYS Performed at [facility], [address], [place],  [address]    Report Status 03/03/2018 FINAL  Final  Culture, Urine     Status: None  Collection Time: 02/26/18  5:12 PM  Result Value Ref Range Status   Specimen Description   Final    URINE, RANDOM Performed at [facility], [address] [place], [place] [address]    Special Requests   Final    NONE Performed at [facility], [address], [place], [place] [address]    Culture   Final    NO GROWTH Performed at [facility], [place] [address] [place], [place] [address]    Report Status 02/28/2018 FINAL  Final  Respiratory Panel by PCR     Status: None   Collection Time: 02/27/18  8:07 AM  Result Value Ref Range Status   Adenovirus NOT DETECTED NOT DETECTED Final   Coronavirus 229E NOT DETECTED NOT DETECTED Final    Comment: (NOTE) The Coronavirus on the Respiratory Panel, DOES NOT test for the novel  Coronavirus (2019 nCoV)    Coronavirus HKU1 NOT DETECTED NOT DETECTED Final   Coronavirus NL63 NOT DETECTED NOT DETECTED Final   Coronavirus OC43 NOT DETECTED NOT DETECTED Final   Metapneumovirus NOT DETECTED NOT DETECTED Final   Rhinovirus / Enterovirus NOT DETECTED NOT DETECTED Final   Influenza A NOT DETECTED NOT DETECTED Final   Influenza B NOT DETECTED NOT DETECTED Final   Parainfluenza Virus 1 NOT DETECTED NOT DETECTED Final   Parainfluenza Virus 2 NOT DETECTED NOT DETECTED Final   Parainfluenza Virus 3 NOT DETECTED NOT DETECTED Final   Parainfluenza Virus 4 NOT DETECTED NOT DETECTED Final   Respiratory Syncytial Virus NOT DETECTED NOT DETECTED Final   Bordetella pertussis NOT DETECTED NOT DETECTED Final   Chlamydophila pneumoniae NOT DETECTED NOT DETECTED Final    Mycoplasma pneumoniae NOT DETECTED NOT DETECTED Final    Comment: Performed at [facility], [place] [address], [place], [place] [address]  Surgical PCR screen     Status: None   Collection Time: 02/28/18  7:52 AM  Result Value Ref Range Status   MRSA, PCR NEGATIVE NEGATIVE Final   Staphylococcus aureus NEGATIVE NEGATIVE Final    Comment: (NOTE) The Xpert SA Assay (FDA approved for NASAL specimens in patients 69 years of age and older), is one component of a comprehensive surveillance program. It is not intended to diagnose infection nor to guide or monitor treatment. Performed at [facility], [address], [place], [place] [address]   Aerobic/Anaerobic Culture (surgical/deep wound)     Status: None (Preliminary result)   Collection Time: 02/28/18  2:27 PM  Result Value Ref Range Status   Specimen Description   Final    WOUND Performed at [facility] [address], [place], [place] [address]    Special Requests   Final    ABSCESS Performed at [facility], [address], [place], [place] [address]    Gram Stain   Final    RARE WBC PRESENT, PREDOMINANTLY PMN RARE GRAM POSITIVE COCCI RARE GRAM VARIABLE ROD RARE GRAM POSITIVE RODS Performed at [facility], [place] [address], [place], [place] [address]    Culture   Final    FEW VANCOMYCIN RESISTANT ENTEROCOCCUS NO ANAEROBES ISOLATED; CULTURE IN PROGRESS FOR 5 DAYS    Report Status PENDING  Incomplete   Organism ID, Bacteria VANCOMYCIN RESISTANT ENTEROCOCCUS  Final      Susceptibility   Vancomycin resistant enterococcus - MIC*    AMPICILLIN >=32 RESISTANT Resistant     VANCOMYCIN >=32 RESISTANT Resistant     GENTAMICIN SYNERGY SENSITIVE Sensitive  LINEZOLID 2 SENSITIVE Sensitive     * FEW VANCOMYCIN RESISTANT ENTEROCOCCUS   Studies/Results: Dg Chest Port 1 View  Result Date: 03/04/2018 CLINICAL DATA:  Pulmonary edema. EXAM: PORTABLE CHEST 1 VIEW COMPARISON:  03/03/2018 FINDINGS: 0538 hours. Endotracheal tube  tip is 5.1 cm above the base of the carina. NG tube passes into the stomach. Right PICC line tip overlies the distal SVC level. Lung volumes are low. The cardio pericardial silhouette is enlarged. There is pulmonary vascular congestion without overt pulmonary edema. Basilar atelectasis noted bilaterally with tiny bilateral pleural effusions. IMPRESSION: Low lung volumes with cardiomegaly, vascular congestion and tiny effusions. Electronically Signed   By: Misty Stanley M.D.   On: 03/04/2018 08:39   Dg Chest Port 1 View  Result Date: 03/03/2018 CLINICAL DATA:  Pulmonary edema. EXAM: PORTABLE CHEST 1 VIEW COMPARISON:  Radiograph of March 02, 2018. FINDINGS: Stable cardiomediastinal silhouette. Atherosclerosis of thoracic aorta is noted. Endotracheal and nasogastric tubes are unchanged in position. Right-sided PICC line is unchanged in position. Stable hypoinflation of the lungs is noted with associated bibasilar atelectasis and small pleural effusions. No pneumothorax is noted. Severe degenerative changes seen involving the left glenohumeral joint. IMPRESSION: Stable support apparatus. Stable hypoinflation of the lungs with mild bibasilar subsegmental atelectasis and small pleural effusions. Aortic Atherosclerosis (ICD10-I70.0). Electronically Signed   By: Marijo Conception, M.D.   On: 03/03/2018 08:27    Medications:  Prior to Admission:  Medications Prior to Admission  Medication Sig Dispense Refill Last Dose  . aspirin EC 81 MG tablet Take 81 mg by mouth daily.   02/20/2018 at Unknown time  . Biotin 1 MG CAPS Take by mouth.   02/19/2018 at Unknown time  . Cholecalciferol (VITAMIN D3) 5000 units TABS Take 5,000 Units by mouth daily.   02/19/2018 at Unknown time  . CRANBERRY PO Take by mouth.   02/20/2018 at Unknown time  . Docusate Sodium (STOOL SOFTENER LAXATIVE PO) Take 2 tablets by mouth every 6 (six) hours as needed.   02/20/2018 at Unknown time  . furosemide (LASIX) 20 MG tablet Take '40mg'$  twice daily, at  6am and 2pm 120 tablet 5 02/19/2018 at Unknown time  . GLUCOSAMINE SULFATE PO Take 2 tablets by mouth daily.    02/19/2018 at Unknown time  . Hypromellose (ARTIFICIAL TEARS OP) Place 2 drops into both eyes daily as needed (for dry eyes).   02/19/2018 at Unknown time  . losartan (COZAAR) 25 MG tablet TAKE 1 TABLET(25 MG) BY MOUTH EVERY MORNING 90 tablet 1 02/20/2018 at Unknown time  . Magnesium 250 MG TABS Take by mouth.   02/19/2018 at Unknown time  . magnesium hydroxide (MILK OF MAGNESIA) 400 MG/5ML suspension Take 15 mLs by mouth daily as needed for mild constipation. 355 mL 0 02/20/2018 at Unknown time  . montelukast (SINGULAIR) 10 MG tablet Take 10 mg by mouth every morning.  0 02/19/2018 at Unknown time  . polyethylene glycol powder (GLYCOLAX/MIRALAX) powder Take 17 g by mouth 2 (two) times daily as needed. 3350 g 1 02/20/2018 at Unknown time  . vitamin B-12 (CYANOCOBALAMIN) 500 MCG tablet Take 500 mcg by mouth daily.   02/19/2018 at Unknown time  . HYDROcodone-acetaminophen (NORCO) 10-325 MG tablet Take 1 tablet by mouth every 6 (six) hours as needed. 20 tablet 0 unknown   Scheduled: . budesonide (PULMICORT) nebulizer solution  0.5 mg Nebulization BID  . chlorhexidine gluconate (MEDLINE KIT)  15 mL Mouth Rinse BID  . Chlorhexidine Gluconate Cloth  6  each Topical Daily  . enoxaparin (LOVENOX) injection  40 mg Subcutaneous Q24H  . ipratropium  0.5 mg Nebulization Q6H  . levalbuterol  1.25 mg Nebulization Q6H  . mouth rinse  15 mL Mouth Rinse 10 times per day  . pantoprazole (PROTONIX) IV  40 mg Intravenous Q12H  . sodium chloride flush  10-40 mL Intracatheter Q12H   Continuous: . sodium chloride Stopped (03/03/18 1643)  . sodium chloride    . albumin human Stopped (03/04/18 0609)  . amiodarone 30 mg/hr (03/04/18 0835)  . ertapenem Stopped (03/03/18 1850)  . furosemide Stopped (03/04/18 9163)  . linezolid (ZYVOX) IV Stopped (03/03/18 1743)  . phenylephrine (NEO-SYNEPHRINE) Adult infusion Stopped  (03/02/18 1225)  . potassium chloride     WGY:KZLDJ/TTSVXBLT arterial line **AND** sodium chloride, acetaminophen **OR** acetaminophen, fentaNYL (SUBLIMAZE) injection, fentaNYL (SUBLIMAZE) injection, fentaNYL (SUBLIMAZE) injection, hydrALAZINE, HYDROmorphone (DILAUDID) injection, iohexol, iopamidol, levalbuterol, LORazepam, midazolam, midazolam, ondansetron **OR** ondansetron (ZOFRAN) IV, oxyCODONE-acetaminophen, phenol, sodium chloride flush, sodium chloride flush  Assesment: She was admitted with bowel obstruction and then ended up with perforation.  She remains on the ventilator.  She has been on pressor support but that is off now.  She had atrial fib with rapid ventricular response but is in sinus rhythm.  She was significantly volume overloaded but she is better with albumin and Lasix.  I think we can discontinue albumin now.  She is on CPAP now through the ventilator and I think she can probably be extubated safely later today. Active Problems:   Mass of colon   Abnormal CT scan, sigmoid colon   Diverticulitis of large intestine without perforation or abscess without bleeding   Large bowel obstruction (HCC)   Constipation   Fever   AKI (acute kidney injury) ([place])   Diverticulitis of large intestine with perforation   Bowel perforation (HCC)   Fecal peritonitis (HCC)   Bronchospasm   Pulmonary edema    Plan: Discontinue albumin.  Continue other treatments.  Potential extubation later today.    LOS: 12 days   Alonza Bogus 03/04/2018, 8:53 AM

## 2018-03-04 NOTE — Progress Notes (Addendum)
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 CT of the abdomen pelvis shows diverticulitis with large amount of stool throughout the colon and concern for underlying colonic obstruction. Underwent sigmoidoscopy showing obstruction in the sigmoid colon with marked edema. GI and surgery following. The patient subsequently developed a new fever. Blood and urine cultures were ordered. Repeat CT abd/pelvis ordered 2/13showed free air. Case was discussed with general surgery and plans to go to OR 2/14. Pt underwent a Hartman's procedure on 02/28/18. She was transferred to ICU on ventilator post-op.  Post-op, she was hypotensive and started on neosynephrine. She also developed new onset atrial fibrillation. She was started on amiodarone IVon 02/28/18. She converted to sinus after ~5 hours. She was kept on amiodarone due to continued critical illness and high risk of reverting back to afib.  She developed fluid overload due to fluid resuscitation and was started on furosemide  Assessment/Plan: Large bowel (sigmoid) obstruction -Underwent sigmoidoscopy showing obstruction in the sigmoid colon with marked edema. Surgery following and recommend initially non-operative tx  -2/7--Underwent sigmoidoscopy showing obstruction in the sigmoid colon with marked edema. -GI recommends outpatient colonoscopy in 6 weeks for full evaluation of the colon. -Sigmoidoscopy biopsy--tubular adenoma -02/27/18 CT abd--New small amount of free intraperitoneal air, consistent with bowel perforation -case discussed with general surgery, Dr.Bridges -2/14--Hartmann procedure  Acute diverticulitiswith perforation/peritonitis -cipro/flagyl d/ced -ertapenem started 2/13 -02/27/18 CT abd--New small  amount of free intraperitoneal air, consistent with bowel perforation -case discussed with general surgery, Dr.Bridges -2/14--Hartmann procedure -TPN/enteral feeding per general surgery -2/16--weaned off neosynephrine -continue ertapenem -add linezolid due VRE from intraop culture -2/18--WBC up today likely due to solumedrol--she remains off pressors and is afebrile and weaning nicely--d/c steroids -am CBC  New Onset Atrial Fibrillation -started amiodarone drip due to hypotension 02/28/18 -converted back to sinus after 4-5 hours on amio -continue amiodarone drip for now as pt continues to be critically due to high risk to reverting back to afib -switch to po amiodarone 200 mg bid once able to tolerate po -remain on amio x 1 month, follow up with cards after d/c -case discussed with Dr. Harl Bowie  Acute on chronic renal failure--CKD stage 3 -baseline creatinine 0.9-1.2 -nonoliguric -consult nephrology--appreciated -due to infectious process and hemodynamic changes -remains fluid overloaded -continue lasix IV per renal  Acute respiratory failure with hypoxia -due to hypoventilation, fluidoverload, bronchospasm -appreciate pulmonary -wean to extubation -echo--EF 60-65%, mild LAE  ??Hematemesis -RN reported multiple episodes of rust/red/brown emesis -consult GI--noted input -continuePPI bid -no blood through OG  Bronchospasm/wheeze -continuesolumedrol--decrease to once daily>>>discontinue -xopenex/atrovent -continuepulmicort -2/16--repeat CXR--personally reviewed--mild improvement in interstitial markings, poor inspiration  Chronic bilateral lower extremity edema Suspect secondary to venous insufficiency/hypoalbuminemia, fluid overload -continue IV lasix  Essential hypertension -Holding losartandue to hypotensionand AKI  Physical deconditioning PT eval-->SNF -Reports ambulating with a walker due to scoliosis prior to hospitalization.    Disposition  Plan:remain in ICU Family Communication:NoFamily at bedside  Consultants:General surgery, GI, renal  Code Status: FULL   DVT Prophylaxis: [place] Lovenox   Procedures: As Listed in Progress Note Above  Antibiotics: cipro2/7>>>2/13 Flagyl2/7>>>2/13 Ertapenem 2/13>>>> linezolid 2/17>>>    The patient is critically ill with multiple organ systems failure and requires high complexity decision making for assessment and support, frequent evaluation and titration  of therapies, application of advanced monitoring technologies and extensive interpretation of multiple databases.  Critical care time - 35 mins.      Subjective: Pt intubated on vent. Awaken and follows commands.  Denies cp, sob. abd pain is controlled  Objective: Vitals:   03/04/18 0721 03/04/18 0727 03/04/18 0731 03/04/18 0811  BP:      Pulse:      Resp:      Temp:    97.9 F (36.6 C)  TempSrc:    Oral  SpO2: 98% 99% 98%   Weight:      Height:        Intake/Output Summary (Last 24 hours) at 03/04/2018 0849 Last data filed at 03/04/2018 0700 Gross per 24 hour  Intake 1584.09 ml  Output 5350 ml  Net -3765.91 ml   Weight change: -5.1 kg Exam:   General:  Pt is alert, follows commands appropriately, not in acute distress  HEENT: No icterus, No thrush, No neck mass, [place]/AT  Cardiovascular: RRR, S1/S2, no rubs, no gallops  Respiratory: bilateral crackles  Abdomen: Soft/+BS, non tender, non distended, no guarding  Extremities: 2 + LE edema, No lymphangitis, No petechiae, No rashes, no synovitis   Data Reviewed: I have personally reviewed following labs and imaging studies Basic Metabolic Panel: Recent Labs  Lab 02/28/18 0601 03/01/18 0419 03/02/18 0448 03/03/18 0445 03/04/18 0618  NA 133* 134* 135 139 140  141  K 4.5 4.3 4.9 4.0 3.3*  3.4*  CL 96* 105 108 110 106  106  CO2 24 21* 18* '22 23  24  ' GLUCOSE 100* 162* 147* 148* 126*  127*  BUN 31* 40* 47* 53* 54*  55*    CREATININE 2.23* 1.95* 1.73* 1.53* 1.35*  1.37*  CALCIUM 8.8* 7.7* 8.0* 8.3* 8.9  9.0  MG 2.5* 2.4  --   --   --   PHOS  --  4.0  --   --  3.6   Liver Function Tests: Recent Labs  Lab 02/27/18 0552 03/03/18 0445 03/04/18 0618  AST 17 11* 11*  ALT '11 9 8  ' ALKPHOS 55 33* 33*  BILITOT 0.8 0.3 0.6  PROT 5.1* 4.3* 5.6*  ALBUMIN 2.3* 1.8* 3.3*  3.2*   No results for input(s): LIPASE, AMYLASE in the last 168 hours. No results for input(s): AMMONIA in the last 168 hours. Coagulation Profile: No results for input(s): INR, PROTIME in the last 168 hours. CBC: Recent Labs  Lab 02/28/18 0601 03/01/18 0419 03/02/18 0448 03/03/18 0445 03/04/18 0618  WBC 26.2* 25.0* 18.3* 17.4* 20.3*  HGB 13.1 10.4* 8.9* 8.4* 9.5*  HCT 40.1 31.7* 27.3* 26.3* 29.6*  MCV 93.9 94.3 92.5 92.6 91.6  PLT 328 367 247 222 279   Cardiac Enzymes: No results for input(s): CKTOTAL, CKMB, CKMBINDEX, TROPONINI in the last 168 hours. BNP: Invalid input(s): POCBNP CBG: Recent Labs  Lab 03/03/18 1703 03/03/18 1946 03/04/18 0039 03/04/18 0504 03/04/18 0813  GLUCAP 117* 139* 135* 130* 139*   HbA1C: No results for input(s): HGBA1C in the last 72 hours. Urine analysis:    Component Value Date/Time   COLORURINE YELLOW 02/26/2018 [place] 02/26/2018 1712   LABSPEC 1.014 02/26/2018 1712   PHURINE 8.0 02/26/2018 1712   GLUCOSEU NEGATIVE 02/26/2018 1712   HGBUR SMALL (A) 02/26/2018 1712   BILIRUBINUR NEGATIVE 02/26/2018 1712   KETONESUR NEGATIVE 02/26/2018 1712   PROTEINUR NEGATIVE 02/26/2018 1712   UROBILINOGEN 0.2 04/21/2014 1200   NITRITE NEGATIVE 02/26/2018 1712  LEUKOCYTESUR NEGATIVE 02/26/2018 1712   Sepsis Labs: '@LABRCNTIP' (procalcitonin:4,lacticidven:4) ) Recent Results (from the past 240 hour(s))  Culture, blood (Routine X 2) w Reflex to ID Panel     Status: None   Collection Time: 02/26/18  9:25 AM  Result Value Ref Range Status   Specimen Description LEFT ANTECUBITAL   Final   Special Requests   Final    BOTTLES DRAWN AEROBIC AND ANAEROBIC Blood Culture adequate volume   Culture   Final    NO GROWTH 5 DAYS Performed at [facility], [address], [place], [place] [address]    Report Status 03/03/2018 FINAL  Final  Culture, blood (Routine X 2) w Reflex to ID Panel     Status: None   Collection Time: 02/26/18  9:33 AM  Result Value Ref Range Status   Specimen Description BLOOD LEFT HAND  Final   Special Requests   Final    BOTTLES DRAWN AEROBIC AND ANAEROBIC Blood Culture adequate volume   Culture   Final    NO GROWTH 5 DAYS Performed at [facility], [address] [place], [place] [address]    Report Status 03/03/2018 FINAL  Final  Culture, Urine     Status: None   Collection Time: 02/26/18  5:12 PM  Result Value Ref Range Status   Specimen Description   Final    URINE, RANDOM Performed at [facility], [address], [place], [place] [address]    Special Requests   Final    NONE Performed at [facility] [address] [place], [place] [address]    Culture   Final    NO GROWTH Performed at [facility], [place] [address] [place], [place] [address]    Report Status 02/28/2018 FINAL  Final  Respiratory Panel by PCR     Status: None   Collection Time: 02/27/18  8:07 AM  Result Value Ref Range Status   Adenovirus NOT DETECTED NOT DETECTED Final   Coronavirus 229E NOT DETECTED NOT DETECTED Final    Comment: (NOTE) The Coronavirus on the Respiratory Panel, DOES NOT test for the novel  Coronavirus (2019 nCoV)    Coronavirus HKU1 NOT DETECTED NOT DETECTED Final   Coronavirus NL63 NOT DETECTED NOT DETECTED Final   Coronavirus OC43 NOT DETECTED NOT DETECTED Final   Metapneumovirus NOT DETECTED NOT DETECTED Final   Rhinovirus / Enterovirus NOT DETECTED NOT DETECTED Final   Influenza A NOT DETECTED NOT DETECTED Final   Influenza B NOT DETECTED NOT DETECTED Final   Parainfluenza Virus 1 NOT DETECTED NOT DETECTED Final   Parainfluenza  Virus 2 NOT DETECTED NOT DETECTED Final   Parainfluenza Virus 3 NOT DETECTED NOT DETECTED Final   Parainfluenza Virus 4 NOT DETECTED NOT DETECTED Final   Respiratory Syncytial Virus NOT DETECTED NOT DETECTED Final   Bordetella pertussis NOT DETECTED NOT DETECTED Final   Chlamydophila pneumoniae NOT DETECTED NOT DETECTED Final   Mycoplasma pneumoniae NOT DETECTED NOT DETECTED Final    Comment: Performed at [facility], [place] [address], [place], [place] [address]  Surgical PCR screen     Status: None   Collection Time: 02/28/18  7:52 AM  Result Value Ref Range Status   MRSA, PCR NEGATIVE NEGATIVE Final   Staphylococcus aureus NEGATIVE NEGATIVE Final    Comment: (NOTE) The Xpert SA Assay (FDA approved for NASAL specimens in patients 64 years of age and older), is one component of a comprehensive surveillance program. It is not intended to diagnose  infection nor to guide or monitor treatment. Performed at [facility], [address] [place], [place] [address]   Aerobic/Anaerobic Culture (surgical/deep wound)     Status: None (Preliminary result)   Collection Time: 02/28/18  2:27 PM  Result Value Ref Range Status   Specimen Description   Final    WOUND Performed at [facility], [address], [place], [place] [address]    Special Requests   Final    ABSCESS Performed at [facility], [address] [place], [place] [address]    Gram Stain   Final    RARE WBC PRESENT, PREDOMINANTLY PMN RARE GRAM POSITIVE COCCI RARE GRAM VARIABLE ROD RARE GRAM POSITIVE RODS Performed at [facility], [place] [address] [place], [place] [address]    Culture   Final    FEW VANCOMYCIN RESISTANT ENTEROCOCCUS NO ANAEROBES ISOLATED; CULTURE IN PROGRESS FOR 5 DAYS    Report Status PENDING  Incomplete   Organism ID, Bacteria VANCOMYCIN RESISTANT ENTEROCOCCUS  Final      Susceptibility   Vancomycin resistant enterococcus - MIC*    AMPICILLIN >=32 RESISTANT Resistant     VANCOMYCIN >=32  RESISTANT Resistant     GENTAMICIN SYNERGY SENSITIVE Sensitive     LINEZOLID 2 SENSITIVE Sensitive     * FEW VANCOMYCIN RESISTANT ENTEROCOCCUS     Scheduled Meds: . budesonide (PULMICORT) nebulizer solution  0.5 mg Nebulization BID  . chlorhexidine gluconate (MEDLINE KIT)  15 mL Mouth Rinse BID  . Chlorhexidine Gluconate Cloth  6 each Topical Daily  . enoxaparin (LOVENOX) injection  40 mg Subcutaneous Q24H  . ipratropium  0.5 mg Nebulization Q6H  . levalbuterol  1.25 mg Nebulization Q6H  . mouth rinse  15 mL Mouth Rinse 10 times per day  . pantoprazole (PROTONIX) IV  40 mg Intravenous Q12H  . sodium chloride flush  10-40 mL Intracatheter Q12H   Continuous Infusions: . sodium chloride Stopped (03/03/18 1643)  . sodium chloride    . albumin human Stopped (03/04/18 0609)  . amiodarone 30 mg/hr (03/04/18 0835)  . ertapenem Stopped (03/03/18 1850)  . furosemide Stopped (03/04/18 2956)  . linezolid (ZYVOX) IV Stopped (03/03/18 1743)  . phenylephrine (NEO-SYNEPHRINE) Adult infusion Stopped (03/02/18 1225)  . potassium chloride      Procedures/Studies: Ct Abdomen Pelvis Wo Contrast  Result Date: 02/27/2018 CLINICAL DATA:  Follow-up diverticulitis. Chronic abdominal pain. EXAM: CT ABDOMEN AND PELVIS WITHOUT CONTRAST TECHNIQUE: Multidetector CT imaging of the abdomen and pelvis was performed following the standard protocol without IV contrast. COMPARISON:  02/20/2018 FINDINGS: Lower chest: New small bilateral pleural effusions and bibasilar atelectasis. Hepatobiliary: Stable small cyst in left hepatic lobe. No mass visualized on this unenhanced exam. Tiny gallstones again noted, however there is no evidence of cholecystitis or biliary ductal dilatation. Pancreas: No mass or inflammatory process visualized on this unenhanced exam. Spleen:  Within normal limits in size. Adrenals/Urinary tract: No evidence of urolithiasis or hydronephrosis. Unremarkable unopacified urinary bladder. Stomach/Bowel:  New small amount of free intraperitoneal air is seen, consistent with bowel perforation. Mild ascites is new since previous study, as well as diffuse mesenteric and body wall edema. Large amount of stool is again seen throughout the colon. A transition point is again seen near the rectosigmoid junction, consistent with stricture. No definite soft tissue mass appreciated by CT. Mild diverticulosis and wall thickening is seen involving the proximal sigmoid, which is similar to previous study and suspicious for mild diverticulitis. No evidence of small bowel wall thickening  or obstruction. No abscess identified. Vascular/Lymphatic: No pathologically enlarged lymph nodes identified. No evidence of abdominal aortic aneurysm. Reproductive: Prior hysterectomy. Limited visualization due to artifact from left hip prosthesis. Other:  None. Musculoskeletal:  No suspicious bone lesions identified. IMPRESSION: 1. New small amount of free intraperitoneal air, consistent with bowel perforation. 2. Large colonic stool burden with transition point near the rectosigmoid junction, consistent with stricture. No definite soft tissue mass appreciated by CT. This is unchanged in appearance compared to previous study. Recommend correlation with colonoscopy to exclude obstructing colon carcinoma. 3. Mild diverticulitis of proximal sigmoid colon, without significant change. 4. New mild ascites and diffuse body wall edema. New small bilateral pleural effusions and bibasilar atelectasis. 5. Cholelithiasis. No radiographic evidence of cholecystitis. Critical Value/emergent results were called by telephone at the time of interpretation on 02/27/2018 at 4:35 pm to Dr. Shanon Brow Koraima Albertsen , who verbally acknowledged these results. Electronically Signed   By: Earle Gell M.D.   On: 02/27/2018 16:38   Ct Abdomen Pelvis W Contrast  Result Date: 02/20/2018 CLINICAL DATA:  Lower abdominal pain and constipation for the past 2 days. Clinical suspicion for  diverticulitis. EXAM: CT ABDOMEN AND PELVIS WITH CONTRAST TECHNIQUE: Multidetector CT imaging of the abdomen and pelvis was performed using the standard protocol following bolus administration of intravenous contrast. CONTRAST:  116m OMNIPAQUE IOHEXOL 300 MG/ML  SOLN COMPARISON:  Abdomen radiographs dated 02/06/2018. Abdomen and pelvis CT dated 04/08/2015. FINDINGS: Lower chest: Unremarkable. Hepatobiliary: No significant change in previously demonstrated liver cysts. Small number of tiny gallstones in the gallbladder measuring up to 4 mm in maximum diameter each. No gallbladder wall thickening or pericholecystic fluid. Pancreas: Unremarkable. No pancreatic ductal dilatation or surrounding inflammatory changes. Spleen: 5 mm oval area of low density in the spleen on image number 29 series 2, difficult to detect with certainty on the previous examination, partly due to differences in technique and timing. Adrenals/Urinary Tract: Normal appearing adrenal glands. Stable tiny exophytic upper pole left renal cyst. Normal appearing right kidney, ureters and urinary bladder. Stomach/Bowel: Large amount of stool throughout the colon to the level of the distal sigmoid colon where there is a short segment of concentric, medium density wall thickening, best seen on image number 72 series 2. The wall thickening is concentric and lower in density on coronal image number 90. Lesser amount of stool and gas in the normal caliber rectum. Minimal sigmoid colon diverticulosis. Mild pericolonic soft tissue stranding involving the distal sigmoid colon, most pronounced at the level of maximal distension of the colon by the stool. Normal appearing stomach, small bowel and appendix. Vascular/Lymphatic: Atheromatous arterial calcifications without aneurysm. No enlarged lymph nodes. Inferior vena cava filter with its tip just above the level of the renal veins. Reproductive: Status post hysterectomy. No adnexal masses. Other: Small amount of  free peritoneal fluid in the pelvis. Musculoskeletal: Left hip prosthesis. Severe right hip degenerative changes with severe joint space narrowing, extensive subarticular cyst formation, spur formation and bony remodeling. Moderate levoconvex thoracolumbar scoliosis. Interbody and pedicle screw and rod fusion at the L4-5 level. Fusion of portions of the T12 and L1 vertebral bodies. Lumbar and lower thoracic spine degenerative changes. IMPRESSION: 1. Large amount of stool throughout the colon to the level of the distal sigmoid colon where there is a short segment of concentric, medium density wall thickening. This is concerning for an obstructing short segment mass. It would be unusual for edema due to diverticulitis to involve that short of a segment of bowel.  Correlation with sigmoidoscopy or colonoscopy is recommended. 2. Mild pericolonic soft tissue stranding involving the distal sigmoid colon, most pronounced at the level of maximal distension of the colon by the stool. This is suspicious for mild diverticulitis without abscess. There is minimal diverticulosis in that region of the colon. 3. Cholelithiasis. 4. 5 mm probable hemangioma in the spleen. Electronically Signed   By: Claudie Revering M.D.   On: 02/20/2018 20:59   Dg Chest Port 1 View  Result Date: 03/04/2018 CLINICAL DATA:  Pulmonary edema. EXAM: PORTABLE CHEST 1 VIEW COMPARISON:  03/03/2018 FINDINGS: 0538 hours. Endotracheal tube tip is 5.1 cm above the base of the carina. NG tube passes into the stomach. Right PICC line tip overlies the distal SVC level. Lung volumes are low. The cardio pericardial silhouette is enlarged. There is pulmonary vascular congestion without overt pulmonary edema. Basilar atelectasis noted bilaterally with tiny bilateral pleural effusions. IMPRESSION: Low lung volumes with cardiomegaly, vascular congestion and tiny effusions. Electronically Signed   By: Misty Stanley M.D.   On: 03/04/2018 08:39   Dg Chest Port 1  View  Result Date: 03/03/2018 CLINICAL DATA:  Pulmonary edema. EXAM: PORTABLE CHEST 1 VIEW COMPARISON:  Radiograph of March 02, 2018. FINDINGS: Stable cardiomediastinal silhouette. Atherosclerosis of thoracic aorta is noted. Endotracheal and nasogastric tubes are unchanged in position. Right-sided PICC line is unchanged in position. Stable hypoinflation of the lungs is noted with associated bibasilar atelectasis and small pleural effusions. No pneumothorax is noted. Severe degenerative changes seen involving the left glenohumeral joint. IMPRESSION: Stable support apparatus. Stable hypoinflation of the lungs with mild bibasilar subsegmental atelectasis and small pleural effusions. Aortic Atherosclerosis (ICD10-I70.0). Electronically Signed   By: Marijo Conception, M.D.   On: 03/03/2018 08:27   Dg Chest Port 1 View  Result Date: 03/02/2018 CLINICAL DATA:  Hypoxia EXAM: PORTABLE CHEST 1 VIEW COMPARISON:  March 01, 2018 FINDINGS: Endotracheal tube tip is 3.7 cm above the carina. Nasogastric tube tip and side port are in the stomach. Right subclavian catheter tip is at the cavoatrial junction. No pneumothorax. There is cardiomegaly with pulmonary venous hypertension. There are small pleural effusions bilaterally. There is atelectatic change in the lung bases. A degree of consolidation in the left base is questioned. There is advanced arthropathy in the left shoulder. IMPRESSION: Tube and catheter positions as described without pneumothorax. Pulmonary vascular congestion present with small pleural effusions bilaterally. There is bibasilar atelectasis with a questionable degree of consolidation in the left base. Aortic Atherosclerosis (ICD10-I70.0). Electronically Signed   By: Lowella Grip III M.D.   On: 03/02/2018 07:38   Dg Chest Port 1 View  Result Date: 03/01/2018 CLINICAL DATA:  Ventilator dependent respiratory failure. Follow-up pulmonary edema. EXAM: PORTABLE CHEST 1 VIEW COMPARISON:  02/28/2018,  02/28/2016 and earlier. FINDINGS: Endotracheal tube tip in satisfactory position projecting approximately 3-4 cm above the carina. RIGHT arm PICC tip projects at or near the cavoatrial junction, unchanged. Nasogastric tube looped in the stomach with its tip in the fundus. Markedly suboptimal inspiration with worsening atelectasis in the lung bases, LEFT greater than RIGHT. Resolution of interstitial pulmonary edema. Persistent pulmonary venous hypertension. IMPRESSION: 1.  Support apparatus satisfactory. 2. Resolution of interstitial pulmonary edema since yesterday, though pulmonary venous hypertension persists. 3. Worsening bibasilar atelectasis, LEFT greater than RIGHT. Electronically Signed   By: Evangeline Dakin M.D.   On: 03/01/2018 09:01   Dg Chest Port 1 View  Result Date: 02/28/2018 CLINICAL DATA:  Intubation. EXAM: PORTABLE CHEST 1 VIEW COMPARISON:  02/26/2018 FINDINGS: The endotracheal tube is 3.3 cm above the carina. There is an NG tube coursing down the esophagus and into the stomach. The right-sided PICC line is stable. Significant worsening lung aeration with very low lung volumes, vascular crowding, pulmonary edema and basilar atelectasis. The heart remains enlarged and there is tortuosity and calcification of the thoracic aorta. IMPRESSION: 1. The endotracheal tube and NG tubes are in good position. 2. Very low lung volumes with vascular crowding and atelectasis 3. Vascular congestion and pulmonary edema. Electronically Signed   By: Marijo Sanes M.D.   On: 02/28/2018 16:37   Dg Chest Port 1 View  Result Date: 02/26/2018 CLINICAL DATA:  84 year old female with intermittent productive cough EXAM: PORTABLE CHEST 1 VIEW COMPARISON:  02/28/2016 FINDINGS: Cardiomediastinal silhouette unchanged. Interval development of asymmetric elevation the right hemidiaphragm. No pneumothorax. Low lung volumes with coarsened interstitial markings and interlobular septal thickening. No large pleural effusion. No  confluent airspace disease. Interval placement of right upper extremity PICC with the tip appearing to terminate superior vena cava. Degenerative changes of the left glenohumeral joint. IMPRESSION: Low lung volumes with questionable edema. New asymmetric elevation the right hemidiaphragm, uncertain significance. Right upper extremity PICC. Electronically Signed   By: Corrie Mckusick D.O.   On: 02/26/2018 19:28   Dg Abd 2 Views  Result Date: 02/06/2018 CLINICAL DATA:  IVC filter placement evaluation. EXAM: ABDOMEN - 2 VIEW COMPARISON:  03/29/2017. FINDINGS: Lumbar spine numbered as per prior exam. IVC filter noted with tip at approximately the L1 level. Degenerative changes and scoliosis thoracic spine. L4-L5 fusion. Rounded calcific densities noted over the right upper quadrant, these could represent gallstones, kidney stones, undigested pill fragments. No bowel distention or free air. Stool noted throughout the colon and rectum. IMPRESSION: IVC filter noted with proximal tip at the L1 level. Electronically Signed   By: Marcello Moores  Register   On: 02/06/2018 14:21   [place] Ekg Site Rite  Result Date: 02/22/2018 If Site Rite image not attached, placement could not be confirmed due to current cardiac rhythm.   Orson Eva, DO  [facility] Pager [phone]  If 7PM-7AM, please contact night-coverage [email or web address] Password [facility] 03/04/2018, 8:49 AM   LOS: 12 days

## 2018-03-05 DIAGNOSIS — I480 Paroxysmal atrial fibrillation: Secondary | ICD-10-CM

## 2018-03-05 DIAGNOSIS — J9601 Acute respiratory failure with hypoxia: Secondary | ICD-10-CM

## 2018-03-05 LAB — AEROBIC/ANAEROBIC CULTURE W GRAM STAIN (SURGICAL/DEEP WOUND)

## 2018-03-05 LAB — CBC
HCT: 27 % — ABNORMAL LOW (ref 36.0–46.0)
Hemoglobin: 8.6 g/dL — ABNORMAL LOW (ref 12.0–15.0)
MCH: 29.5 pg (ref 26.0–34.0)
MCHC: 31.9 g/dL (ref 30.0–36.0)
MCV: 92.5 fL (ref 80.0–100.0)
PLATELETS: 275 10*3/uL (ref 150–400)
RBC: 2.92 MIL/uL — ABNORMAL LOW (ref 3.87–5.11)
RDW: 14.5 % (ref 11.5–15.5)
WBC: 19.2 10*3/uL — ABNORMAL HIGH (ref 4.0–10.5)
nRBC: 0.1 % (ref 0.0–0.2)

## 2018-03-05 LAB — RENAL FUNCTION PANEL
ALBUMIN: 2.4 g/dL — AB (ref 3.5–5.0)
Anion gap: 10 (ref 5–15)
BUN: 56 mg/dL — ABNORMAL HIGH (ref 8–23)
CALCIUM: 8.4 mg/dL — AB (ref 8.9–10.3)
CO2: 28 mmol/L (ref 22–32)
CREATININE: 1.39 mg/dL — AB (ref 0.44–1.00)
Chloride: 101 mmol/L (ref 98–111)
GFR calc Af Amer: 41 mL/min — ABNORMAL LOW (ref >60)
GFR calc non Af Amer: 35 mL/min — ABNORMAL LOW (ref >60)
Glucose, Bld: 210 mg/dL — ABNORMAL HIGH (ref 70–99)
Phosphorus: 4.1 mg/dL (ref 2.5–4.6)
Potassium: 3.3 mmol/L — ABNORMAL LOW (ref 3.5–5.1)
SODIUM: 139 mmol/L (ref 135–145)

## 2018-03-05 LAB — GLUCOSE, CAPILLARY
Glucose-Capillary: 178 mg/dL — ABNORMAL HIGH (ref 70–99)
Glucose-Capillary: 159 mg/dL — ABNORMAL HIGH (ref 70–99)
Glucose-Capillary: 222 mg/dL — ABNORMAL HIGH (ref 70–99)
Glucose-Capillary: 207 mg/dL — ABNORMAL HIGH (ref 70–99)

## 2018-03-05 MED ORDER — ENSURE ENLIVE PO LIQD
237.0000 mL | Freq: Two times a day (BID) | ORAL | Status: DC
  Administered 2018-03-05 – 2018-03-13 (×13): 237 mL via ORAL

## 2018-03-05 MED ORDER — FLUCONAZOLE IN SODIUM CHLORIDE 200-0.9 MG/100ML-% IV SOLN
200.0000 mg | INTRAVENOUS | Status: DC
  Administered 2018-03-06 – 2018-03-09 (×4): 200 mg via INTRAVENOUS
  Filled 2018-03-05 (×7): qty 100

## 2018-03-05 MED ORDER — POTASSIUM CHLORIDE 10 MEQ/100ML IV SOLN
10.0000 meq | INTRAVENOUS | Status: AC
  Administered 2018-03-05 (×4): 10 meq via INTRAVENOUS
  Filled 2018-03-05 (×4): qty 100

## 2018-03-05 MED ORDER — AMIODARONE HCL 200 MG PO TABS
200.0000 mg | ORAL_TABLET | Freq: Two times a day (BID) | ORAL | Status: DC
  Administered 2018-03-05 – 2018-03-13 (×15): 200 mg via ORAL
  Filled 2018-03-05 (×17): qty 1

## 2018-03-05 MED ORDER — FUROSEMIDE 10 MG/ML IJ SOLN
120.0000 mg | Freq: Two times a day (BID) | INTRAVENOUS | Status: DC
  Filled 2018-03-05 (×2): qty 12

## 2018-03-05 MED ORDER — FUROSEMIDE 10 MG/ML IJ SOLN
20.0000 mg | Freq: Two times a day (BID) | INTRAMUSCULAR | Status: DC
  Administered 2018-03-05 – 2018-03-06 (×2): 20 mg via INTRAVENOUS
  Filled 2018-03-05 (×2): qty 2

## 2018-03-05 MED ORDER — FLUCONAZOLE IN SODIUM CHLORIDE 400-0.9 MG/200ML-% IV SOLN
400.0000 mg | Freq: Once | INTRAVENOUS | Status: AC
  Administered 2018-03-05: 400 mg via INTRAVENOUS
  Filled 2018-03-05: qty 200

## 2018-03-05 NOTE — Progress Notes (Addendum)
5 Days Post-Op  Subjective: Patient resting comfortably.  No significant pain.  Objective: Vital signs in last 24 hours: Temp:  [97.3 F (36.3 C)-99.4 F (37.4 C)] 99.4 F (37.4 C) (02/19 0400) Pulse Rate:  [65-87] 65 (02/19 0600) Resp:  [14-21] 18 (02/19 0600) BP: (100-151)/(47-74) 131/47 (02/19 0600) SpO2:  [92 %-99 %] 92 % (02/19 0600) Arterial Line BP: (128-189)/(123-185) 189/185 (02/18 1000) Weight:  [109.5 kg] 109.5 kg (02/19 0500) Last BM Date: 03/05/18  Intake/Output from previous day: 02/18 0701 - 02/19 0700 In: 1027.3 [P.O.:240; I.V.:187.3; IV Piggyback:600] Out: 5100 [Urine:2700; Emesis/NG GURKYH:0623] Intake/Output this shift: No intake/output data recorded.  General appearance: alert, cooperative and no distress GI: Soft, incision healing well with packing in place.  Ostomy pink and patent.  Bowel sounds active.  Lab Results:  Recent Labs    03/04/18 0618 03/05/18 0404  WBC 20.3* 19.2*  HGB 9.5* 8.6*  HCT 29.6* 27.0*  PLT 279 275   BMET Recent Labs    03/04/18 0618 03/05/18 0404  NA 140  141 139  K 3.3*  3.4* 3.3*  CL 106  106 101  CO2 23  24 28   GLUCOSE 126*  127* 210*  BUN 54*  55* 56*  CREATININE 1.35*  1.37* 1.39*  CALCIUM 8.9  9.0 8.4*   PT/INR No results for input(s): LABPROT, INR in the last 72 hours.  Studies/Results: Dg Chest Port 1 View  Result Date: 03/04/2018 CLINICAL DATA:  Pulmonary edema. EXAM: PORTABLE CHEST 1 VIEW COMPARISON:  03/03/2018 FINDINGS: 0538 hours. Endotracheal tube tip is 5.1 cm above the base of the carina. NG tube passes into the stomach. Right PICC line tip overlies the distal SVC level. Lung volumes are low. The cardio pericardial silhouette is enlarged. There is pulmonary vascular congestion without overt pulmonary edema. Basilar atelectasis noted bilaterally with tiny bilateral pleural effusions. IMPRESSION: Low lung volumes with cardiomegaly, vascular congestion and tiny effusions. Electronically Signed    By: Misty Stanley M.D.   On: 03/04/2018 08:39    Anti-infectives: Anti-infectives (From admission, onward)   Start     Dose/Rate Route Frequency Ordered Stop   03/03/18 1700  meropenem (MERREM) 1 g in sodium chloride 0.9 % 100 mL IVPB  Status:  Discontinued     1 g 200 mL/hr over 30 Minutes Intravenous Every 12 hours 03/03/18 1000 03/03/18 1507   03/03/18 1700  ertapenem (INVANZ) 1,000 mg in sodium chloride 0.9 % 100 mL IVPB     1 g 200 mL/hr over 30 Minutes Intravenous Every 24 hours 03/03/18 1507     03/03/18 1600  linezolid (ZYVOX) IVPB 600 mg     600 mg 300 mL/hr over 60 Minutes Intravenous Every 12 hours 03/03/18 1507     03/02/18 1800  ertapenem (INVANZ) 1,000 mg in sodium chloride 0.9 % 100 mL IVPB  Status:  Discontinued     1 g 200 mL/hr over 30 Minutes Intravenous Every 24 hours 03/02/18 0829 03/03/18 0959   02/28/18 1800  ertapenem (INVANZ) 500 mg in sodium chloride 0.9 % 50 mL IVPB  Status:  Discontinued     500 mg 100 mL/hr over 30 Minutes Intravenous Every 24 hours 02/28/18 1035 03/02/18 0829   02/27/18 1800  ertapenem (INVANZ) 1,000 mg in sodium chloride 0.9 % 100 mL IVPB  Status:  Discontinued     1 g 200 mL/hr over 30 Minutes Intravenous Every 24 hours 02/27/18 1705 02/28/18 1035   02/21/18 0800  ciprofloxacin (CIPRO) IVPB 400  mg  Status:  Discontinued     400 mg 200 mL/hr over 60 Minutes Intravenous Every 12 hours 02/20/18 2204 02/27/18 1908   02/21/18 0500  metroNIDAZOLE (FLAGYL) IVPB 500 mg  Status:  Discontinued     500 mg 100 mL/hr over 60 Minutes Intravenous Every 8 hours 02/20/18 2204 02/27/18 1908   02/20/18 2115  ciprofloxacin (CIPRO) IVPB 400 mg     400 mg 200 mL/hr over 60 Minutes Intravenous  Once 02/20/18 2113 02/20/18 2335   02/20/18 2115  metroNIDAZOLE (FLAGYL) tablet 500 mg     500 mg Oral  Once 02/20/18 2113 02/20/18 2147      Assessment/Plan: s/p Procedure(s): PARTIAL COLECTOMY WITH COLOSTOMY Impression: Patient continues to improve on  postoperative day 4.  She is extubated.  Her GI function has returned.  Advance to full liquid diet.  She still has a leukocytosis.  Should this continue, repeat CT scan of the abdomen may be indicated.  Will add Diflucan as patient had fecal peritonitis and was on Zosyn prior to surgery.  LOS: 13 days    [name] 03/05/2018

## 2018-03-05 NOTE — Progress Notes (Signed)
Nutrition Follow-up  DOCUMENTATION CODES:  Obesity unspecified  INTERVENTION:  Ensure Enlive po BID, each supplement provides 350 kcal and 20 grams of protein  NUTRITION DIAGNOSIS:  Increased nutrient needs related to post-op healing as evidenced by estimated nutritional requirements for this outcome  New dx  GOAL:  Patient will meet greater than or equal to 90% of their needs  Progressing  MONITOR:  PO intake, Supplement acceptance, Diet advancement, Skin, Weight trends, Labs, I & O's  REASON FOR ASSESSMENT:  Change in status (Extubated)  ASSESSMENT:  84 y/o female PMHx HTN, anxiety, CKD. Originally presented to [facility] on 2/6 w/ constipation and abdominal pain x3 days. CT showed mass/obstruction of sigmoid colon. Admitted for management.   Since admit, pts intake records have been as follows: 2/6-2/7: NPO 2/8-2/9: CL diet: 50-100% of meals 2/9: FL diet: 50% of meals 2/10-2/13: Soft diet: Mostly ate 25-50% of meals  2/14- 2/18: NPO 2/18-2/19: CL: 50-100% of meals 2/19- XX  Pt was successfully extubated yesterday afternoon. She had gone ~5 days w/o intake. She was started on a CL diet and has been doing well with this. Today, on RD arrival, she is seen being fed by family. Pt is unable to move her arms and requires feeding assistance.   Patients main complaint is congestion and pain, the latter of which is more a chronic issues she relates to scoliosis. Patient has no nutrition related complaints. She seems to have an appetite and asks about bringing in certain beverages from outside the hospital. She is agreeable to oral supplements.   Wt hx: Admitted at 243 lbs. Last wt taken 2/14: 241.3 lbs. She appears to have gained weight the several months preceding her admission.  Wt was in the 230s this past fall and it appears her wt has fluctuated between  220-235 for the past year.   [name] diet is slowly being advanced and she is tolerating intake well. Will continue to monitor  meal/supplement intake.   Labs:  BG x24 hrs: 160-220, BUN/Creat:56/1.39 (17/1.21 pre-op), albumin: 2.4, WBC:19.2 Oral Meds: ppi Infusions: Amiodarone, IVF, IV abx, IV diflucan  Recent Labs  Lab 02/28/18 0601 03/01/18 0419  03/03/18 0445 03/04/18 0618 03/05/18 0404  NA 133* 134*   < > 139 140  141 139  K 4.5 4.3   < > 4.0 3.3*  3.4* 3.3*  CL 96* 105   < > 110 106  106 101  CO2 24 21*   < > 22 23  24 28   BUN 31* 40*   < > 53* 54*  55* 56*  CREATININE 2.23* 1.95*   < > 1.53* 1.35*  1.37* 1.39*  CALCIUM 8.8* 7.7*   < > 8.3* 8.9  9.0 8.4*  MG 2.5* 2.4  --   --   --   --   PHOS  --  4.0  --   --  3.6 4.1  GLUCOSE 100* 162*   < > 148* 126*  127* 210*   < > = values in this interval not displayed.   NUTRITION - FOCUSED PHYSICAL EXAM: Mild muscle/fat wasting of head. Moderate edema masking majority of area. Will repeat as edema subsides  Diet Order:   Diet Order            Diet full liquid Room service appropriate? Yes; Fluid consistency: Thin  Diet effective now             EDUCATION NEEDS:  No education needs have been identified at this  time  Skin:  Surgical incision to abdomen New colostomy MASD to abdomen   Last BM:  2/19 -colostomy  Height:  Ht Readings from Last 1 Encounters:  03/03/18 5\' 6"  (1.676 m)   Weight:  Wt Readings from Last 1 Encounters:  03/05/18 109.5 kg   Wt Readings from Last 10 Encounters:  03/05/18 109.5 kg  01/29/18 97.1 kg  12/19/17 103.4 kg  12/09/17 100.5 kg  07/24/17 95.3 kg  07/21/17 95.3 kg  06/06/17 105.6 kg  05/29/17 106.1 kg  05/02/17 106.1 kg  04/03/17 107.5 kg   Ideal Body Weight:  59.1 kg  BMI:  Body mass index is 38.96 kg/m.  Estimated Nutritional Needs:  Kcal:  1650-1850 kcals (15-17 kcal/kg bw) Protein:  83-95g Pro Fluid:  Per MDs assessment  Burtis Junes RD, LDN, CNSC Clinical Nutrition Available Tues-Sat via Pager: [phone] 03/05/2018 1:16 PM

## 2018-03-05 NOTE — Progress Notes (Signed)
Subjective: She is postopexploratory laparotomy and diverting colostomy.  She had perforation of her colon with widespread fecal peritonitis.  She was left intubated postop but was able to be extubated yesterday.  She had significant fluid shifts from volume resuscitation and from low albumin and that is better.  She was hypotensive requiring pressor support and had atrial fib with rapid ventricular response that required amiodarone.  Her blood pressure has been improved and she has remained mostly in sinus rhythm now.  Objective: Vital signs in last 24 hours: Temp:  [97.3 F (36.3 C)-99.4 F (37.4 C)] 99.4 F (37.4 C) (02/19 0400) Pulse Rate:  [65-87] 65 (02/19 0600) Resp:  [14-21] 18 (02/19 0600) BP: (100-151)/(47-74) 131/47 (02/19 0600) SpO2:  [92 %-99 %] 92 % (02/19 0600) Arterial Line BP: (128-189)/(123-185) 189/185 (02/18 1000) Weight:  [109.5 kg] 109.5 kg (02/19 0500) Weight change: -4 kg Last BM Date: 03/05/18  Intake/Output from previous day: 02/18 0701 - 02/19 0700 In: 1027.3 [P.O.:240; I.V.:187.3; IV Piggyback:600] Out: 5100 [Urine:2700; Emesis/NG output:2400]  PHYSICAL EXAM General appearance: Sleepy but arousable Resp: rhonchi bilaterally Cardio: regular rate and rhythm, S1, S2 normal, no murmur, click, rub or gallop GI: She is postop.  Nontender.  Bowel sounds present Extremities: She still has some edema but it is better  Lab Results:  Results for orders placed or performed during the hospital encounter of 02/20/18 (from the past 48 hour(s))  Glucose, capillary     Status: Abnormal   Collection Time: 03/03/18  8:15 AM  Result Value Ref Range   Glucose-Capillary 124 (H) 70 - 99 mg/dL  Glucose, capillary     Status: Abnormal   Collection Time: 03/03/18 11:32 AM  Result Value Ref Range   Glucose-Capillary 133 (H) 70 - 99 mg/dL  Glucose, capillary     Status: Abnormal   Collection Time: 03/03/18  5:03 PM  Result Value Ref Range   Glucose-Capillary 117 (H) 70 -  99 mg/dL  Glucose, capillary     Status: Abnormal   Collection Time: 03/03/18  7:46 PM  Result Value Ref Range   Glucose-Capillary 139 (H) 70 - 99 mg/dL  Glucose, capillary     Status: Abnormal   Collection Time: 03/04/18 12:39 AM  Result Value Ref Range   Glucose-Capillary 135 (H) 70 - 99 mg/dL  Glucose, capillary     Status: Abnormal   Collection Time: 03/04/18  5:04 AM  Result Value Ref Range   Glucose-Capillary 130 (H) 70 - 99 mg/dL  Blood gas, arterial     Status: None   Collection Time: 03/04/18  6:10 AM  Result Value Ref Range   FIO2 40.00    Delivery systems VENTILATOR    Mode PRESSURE REGULATED VOLUME CONTROL    VT 540 mL   LHR 14.0 resp/min   Peep/cpap 5.0 cm H20   pH, Arterial 7.413 7.350 - 7.450   pCO2 arterial 40.0 32.0 - 48.0 mmHg   pO2, Arterial 89.9 83.0 - 108.0 mmHg   Bicarbonate 25.3 20.0 - 28.0 mmol/L   Acid-Base Excess 1.0 0.0 - 2.0 mmol/L   O2 Saturation 96.8 %   Patient temperature 37.0    Allens test (pass/fail) PASS PASS    Comment: Performed at [facility], [address], [place], [place] [address]  Comprehensive metabolic panel     Status: Abnormal   Collection Time: 03/04/18  6:18 AM  Result Value Ref Range   Sodium 141 135 - 145 mmol/L   Potassium 3.4 (L) 3.5 -  5.1 mmol/L   Chloride 106 98 - 111 mmol/L   CO2 24 22 - 32 mmol/L   Glucose, Bld 127 (H) 70 - 99 mg/dL   BUN 55 (H) 8 - 23 mg/dL   Creatinine, Ser 1.37 (H) 0.44 - 1.00 mg/dL   Calcium 9.0 8.9 - 10.3 mg/dL   Total Protein 5.6 (L) 6.5 - 8.1 g/dL   Albumin 3.2 (L) 3.5 - 5.0 g/dL   AST 11 (L) 15 - 41 U/L   ALT 8 0 - 44 U/L   Alkaline Phosphatase 33 (L) 38 - 126 U/L   Total Bilirubin 0.6 0.3 - 1.2 mg/dL   GFR calc non Af Amer 36 (L) >60 mL/min   GFR calc Af Amer 42 (L) >60 mL/min   Anion gap 11 5 - 15    Comment: Performed at [facility], [address], [place], [place] [address]  Renal function panel     Status: Abnormal   Collection Time: 03/04/18  6:18 AM  Result Value Ref Range    Sodium 140 135 - 145 mmol/L   Potassium 3.3 (L) 3.5 - 5.1 mmol/L   Chloride 106 98 - 111 mmol/L   CO2 23 22 - 32 mmol/L   Glucose, Bld 126 (H) 70 - 99 mg/dL   BUN 54 (H) 8 - 23 mg/dL   Creatinine, Ser 1.35 (H) 0.44 - 1.00 mg/dL   Calcium 8.9 8.9 - 10.3 mg/dL   Phosphorus 3.6 2.5 - 4.6 mg/dL   Albumin 3.3 (L) 3.5 - 5.0 g/dL   GFR calc non Af Amer 37 (L) >60 mL/min   GFR calc Af Amer 43 (L) >60 mL/min   Anion gap 11 5 - 15    Comment: Performed at [facility], [address], [place], [place] [address]  CBC     Status: Abnormal   Collection Time: 03/04/18  6:18 AM  Result Value Ref Range   WBC 20.3 (H) 4.0 - 10.5 K/uL   RBC 3.23 (L) 3.87 - 5.11 MIL/uL   Hemoglobin 9.5 (L) 12.0 - 15.0 g/dL   HCT 29.6 (L) 36.0 - 46.0 %   MCV 91.6 80.0 - 100.0 fL   MCH 29.4 26.0 - 34.0 pg   MCHC 32.1 30.0 - 36.0 g/dL   RDW 14.4 11.5 - 15.5 %   Platelets 279 150 - 400 K/uL   nRBC 0.2 0.0 - 0.2 %    Comment: Performed at [facility], [address], [place], [place] [address]  Glucose, capillary     Status: Abnormal   Collection Time: 03/04/18  8:13 AM  Result Value Ref Range   Glucose-Capillary 139 (H) 70 - 99 mg/dL  Glucose, capillary     Status: Abnormal   Collection Time: 03/04/18 11:13 AM  Result Value Ref Range   Glucose-Capillary 170 (H) 70 - 99 mg/dL  Glucose, capillary     Status: Abnormal   Collection Time: 03/04/18  3:53 PM  Result Value Ref Range   Glucose-Capillary 161 (H) 70 - 99 mg/dL   Comment 1 Notify RN    Comment 2 Document in Chart   Glucose, capillary     Status: Abnormal   Collection Time: 03/04/18  7:46 PM  Result Value Ref Range   Glucose-Capillary 188 (H) 70 - 99 mg/dL   Comment 1 Notify RN    Comment 2 Document in Chart   Renal function panel     Status: Abnormal   Collection Time: 03/05/18  4:04 AM  Result Value Ref Range   Sodium 139 135 - 145 mmol/L   Potassium 3.3 (L) 3.5 - 5.1 mmol/L   Chloride 101 98 - 111 mmol/L   CO2 28 22 - 32 mmol/L   Glucose, Bld  210 (H) 70 - 99 mg/dL   BUN 56 (H) 8 - 23 mg/dL   Creatinine, Ser 1.39 (H) 0.44 - 1.00 mg/dL   Calcium 8.4 (L) 8.9 - 10.3 mg/dL   Phosphorus 4.1 2.5 - 4.6 mg/dL   Albumin 2.4 (L) 3.5 - 5.0 g/dL   GFR calc non Af Amer 35 (L) >60 mL/min   GFR calc Af Amer 41 (L) >60 mL/min   Anion gap 10 5 - 15    Comment: Performed at [facility], [address] [place], [place] [address]  CBC     Status: Abnormal   Collection Time: 03/05/18  4:04 AM  Result Value Ref Range   WBC 19.2 (H) 4.0 - 10.5 K/uL   RBC 2.92 (L) 3.87 - 5.11 MIL/uL   Hemoglobin 8.6 (L) 12.0 - 15.0 g/dL   HCT 27.0 (L) 36.0 - 46.0 %   MCV 92.5 80.0 - 100.0 fL   MCH 29.5 26.0 - 34.0 pg   MCHC 31.9 30.0 - 36.0 g/dL   RDW 14.5 11.5 - 15.5 %   Platelets 275 150 - 400 K/uL   nRBC 0.1 0.0 - 0.2 %    Comment: Performed at [facility], [address] [place], [place] [address]  Glucose, capillary     Status: Abnormal   Collection Time: 03/05/18  4:15 AM  Result Value Ref Range   Glucose-Capillary 178 (H) 70 - 99 mg/dL  Glucose, capillary     Status: Abnormal   Collection Time: 03/05/18  7:49 AM  Result Value Ref Range   Glucose-Capillary 159 (H) 70 - 99 mg/dL   Comment 1 Notify RN     ABGS Recent Labs    03/04/18 0610  PHART 7.413  PO2ART 89.9  HCO3 25.3   CULTURES Recent Results (from the past 240 hour(s))  Culture, blood (Routine X 2) w Reflex to ID Panel     Status: None   Collection Time: 02/26/18  9:25 AM  Result Value Ref Range Status   Specimen Description LEFT ANTECUBITAL  Final   Special Requests   Final    BOTTLES DRAWN AEROBIC AND ANAEROBIC Blood Culture adequate volume   Culture   Final    NO GROWTH 5 DAYS Performed at [facility], [address] [place], [place] [address]    Report Status 03/03/2018 FINAL  Final  Culture, blood (Routine X 2) w Reflex to ID Panel     Status: None   Collection Time: 02/26/18  9:33 AM  Result Value Ref Range Status   Specimen Description BLOOD LEFT HAND  Final    Special Requests   Final    BOTTLES DRAWN AEROBIC AND ANAEROBIC Blood Culture adequate volume   Culture   Final    NO GROWTH 5 DAYS Performed at [facility], [address], [place], [place] [address]    Report Status 03/03/2018 FINAL  Final  Culture, Urine     Status: None   Collection Time: 02/26/18  5:12 PM  Result Value Ref Range Status   Specimen Description   Final    URINE, RANDOM Performed at [facility], [address] [place], [place] [address]    Special Requests   Final    NONE Performed at  [address] [place], [place] [address]    Culture   Final    NO GROWTH Performed at [facility], [place] [address], [place], [place] [address]    Report Status 02/28/2018 FINAL  Final  Respiratory Panel by PCR     Status: None   Collection Time: 02/27/18  8:07 AM  Result Value Ref Range Status   Adenovirus NOT DETECTED NOT DETECTED Final   Coronavirus 229E NOT DETECTED NOT DETECTED Final    Comment: (NOTE) The Coronavirus on the Respiratory Panel, DOES NOT test for the novel  Coronavirus (2019 nCoV)    Coronavirus HKU1 NOT DETECTED NOT DETECTED Final   Coronavirus NL63 NOT DETECTED NOT DETECTED Final   Coronavirus OC43 NOT DETECTED NOT DETECTED Final   Metapneumovirus NOT DETECTED NOT DETECTED Final   Rhinovirus / Enterovirus NOT DETECTED NOT DETECTED Final   Influenza A NOT DETECTED NOT DETECTED Final   Influenza B NOT DETECTED NOT DETECTED Final   Parainfluenza Virus 1 NOT DETECTED NOT DETECTED Final   Parainfluenza Virus 2 NOT DETECTED NOT DETECTED Final   Parainfluenza Virus 3 NOT DETECTED NOT DETECTED Final   Parainfluenza Virus 4 NOT DETECTED NOT DETECTED Final   Respiratory Syncytial Virus NOT DETECTED NOT DETECTED Final   Bordetella pertussis NOT DETECTED NOT DETECTED Final   Chlamydophila pneumoniae NOT DETECTED NOT DETECTED Final   Mycoplasma pneumoniae NOT DETECTED NOT DETECTED Final    Comment: Performed at [facility], [place] [address], [place], [place] [address]  Surgical PCR screen     Status: None   Collection Time: 02/28/18  7:52 AM  Result Value Ref Range Status   MRSA, PCR NEGATIVE NEGATIVE Final   Staphylococcus aureus NEGATIVE NEGATIVE Final    Comment: (NOTE) The Xpert SA Assay (FDA approved for NASAL specimens in patients 22 years of age and older), is one component of a comprehensive surveillance program. It is not intended to diagnose infection nor to guide or monitor treatment. Performed at [facility], [address] [place], [place] [address]   Aerobic/Anaerobic Culture (surgical/deep wound)     Status: None (Preliminary result)   Collection Time: 02/28/18  2:27 PM  Result Value Ref Range Status   Specimen Description   Final    WOUND Performed at [facility], [address], [place], [place] [address]    Special Requests   Final    ABSCESS Performed at [facility], [address], [place], [place] [address]    Gram Stain   Final    RARE WBC PRESENT, PREDOMINANTLY PMN RARE GRAM POSITIVE COCCI RARE GRAM VARIABLE ROD RARE GRAM POSITIVE RODS Performed at [facility], [place] [address], [place], [place] [address]    Culture   Final    FEW VANCOMYCIN RESISTANT ENTEROCOCCUS NO ANAEROBES ISOLATED; CULTURE IN PROGRESS FOR 5 DAYS    Report Status PENDING  Incomplete   Organism ID, Bacteria VANCOMYCIN RESISTANT ENTEROCOCCUS  Final      Susceptibility   Vancomycin resistant enterococcus - MIC*    AMPICILLIN >=32 RESISTANT Resistant     VANCOMYCIN >=32 RESISTANT Resistant     GENTAMICIN SYNERGY SENSITIVE Sensitive     LINEZOLID 2 SENSITIVE Sensitive     * FEW VANCOMYCIN RESISTANT ENTEROCOCCUS   Studies/Results: Dg Chest Port 1 View  Result Date: 03/04/2018 CLINICAL DATA:  Pulmonary edema. EXAM: PORTABLE CHEST 1 VIEW COMPARISON:  03/03/2018 FINDINGS: 0538 hours. Endotracheal tube tip is 5.1 cm above  the base of the carina. NG tube passes into the stomach. Right PICC line tip overlies the  distal SVC level. Lung volumes are low. The cardio pericardial silhouette is enlarged. There is pulmonary vascular congestion without overt pulmonary edema. Basilar atelectasis noted bilaterally with tiny bilateral pleural effusions. IMPRESSION: Low lung volumes with cardiomegaly, vascular congestion and tiny effusions. Electronically Signed   By: Misty [place] M.D.   On: 03/04/2018 08:39    Medications:  Prior to Admission:  Medications Prior to Admission  Medication Sig Dispense Refill Last Dose  . aspirin EC 81 MG tablet Take 81 mg by mouth daily.   02/20/2018 at Unknown time  . Biotin 1 MG CAPS Take by mouth.   02/19/2018 at Unknown time  . Cholecalciferol (VITAMIN D3) 5000 units TABS Take 5,000 Units by mouth daily.   02/19/2018 at Unknown time  . CRANBERRY PO Take by mouth.   02/20/2018 at Unknown time  . Docusate Sodium (STOOL SOFTENER LAXATIVE PO) Take 2 tablets by mouth every 6 (six) hours as needed.   02/20/2018 at Unknown time  . furosemide (LASIX) 20 MG tablet Take 40mg  twice daily, at 6am and 2pm 120 tablet 5 02/19/2018 at Unknown time  . GLUCOSAMINE SULFATE PO Take 2 tablets by mouth daily.    02/19/2018 at Unknown time  . Hypromellose (ARTIFICIAL TEARS OP) Place 2 drops into both eyes daily as needed (for dry eyes).   02/19/2018 at Unknown time  . losartan (COZAAR) 25 MG tablet TAKE 1 TABLET(25 MG) BY MOUTH EVERY MORNING 90 tablet 1 02/20/2018 at Unknown time  . Magnesium 250 MG TABS Take by mouth.   02/19/2018 at Unknown time  . magnesium hydroxide (MILK OF MAGNESIA) 400 MG/5ML suspension Take 15 mLs by mouth daily as needed for mild constipation. 355 mL 0 02/20/2018 at Unknown time  . montelukast (SINGULAIR) 10 MG tablet Take 10 mg by mouth every morning.  0 02/19/2018 at Unknown time  . polyethylene glycol powder (GLYCOLAX/MIRALAX) powder Take 17 g by mouth 2 (two) times daily as needed. 3350 g 1 02/20/2018 at Unknown time  . vitamin B-12 (CYANOCOBALAMIN) 500 MCG tablet Take 500 mcg by mouth daily.    02/19/2018 at Unknown time  . HYDROcodone-acetaminophen (NORCO) 10-325 MG tablet Take 1 tablet by mouth every 6 (six) hours as needed. 20 tablet 0 unknown   Scheduled: . budesonide (PULMICORT) nebulizer solution  0.5 mg Nebulization BID  . Chlorhexidine Gluconate Cloth  6 each Topical Daily  . enoxaparin (LOVENOX) injection  40 mg Subcutaneous Q24H  . ipratropium  0.5 mg Nebulization Q6H  . levalbuterol  1.25 mg Nebulization Q6H  . pantoprazole (PROTONIX) IV  40 mg Intravenous Q12H  . sodium chloride flush  10-40 mL Intracatheter Q12H   Continuous: . sodium chloride Stopped (03/03/18 1643)  . sodium chloride    . amiodarone 30 mg/hr (03/04/18 2044)  . ertapenem 1,000 mg (03/04/18 1623)  . furosemide 160 mg (03/05/18 0732)  . linezolid (ZYVOX) IV 600 mg (03/05/18 0111)   XNA:TFTDD/UKGURKYH arterial line **AND** sodium chloride, acetaminophen **OR** acetaminophen, hydrALAZINE, HYDROmorphone (DILAUDID) injection, iohexol, iopamidol, levalbuterol, LORazepam, ondansetron **OR** ondansetron (ZOFRAN) IV, oxyCODONE-acetaminophen, phenol, sodium chloride flush, sodium chloride flush  Assesment: She was admitted with large bowel obstruction and eventually had perforation and fecal peritonitis.  She had surgery and had colostomy.  She was left on the ventilator postop but was able to be extubated yesterday.  She had acute kidney injury which is improved  She has fecal  peritonitis which is being treated with broad-spectrum antibiotics  She had significant volume overload from resuscitation and that has improved  She had atrial fib but now is in sinus rhythm  She had hypotension/sepsis requiring pressor support and she is off pressors now Active Problems:   Mass of colon   Abnormal CT scan, sigmoid colon   Diverticulitis of large intestine without perforation or abscess without bleeding   Large bowel obstruction (HCC)   Constipation   Fever   AKI (acute kidney injury) ([place])   Diverticulitis  of large intestine with perforation   Bowel perforation (HCC)   Fecal peritonitis ([place])   Bronchospasm   Pulmonary edema    Plan: Continue current treatments she is slowly improving    LOS: 13 days   Regina Hall 03/05/2018, 8:05 AM

## 2018-03-05 NOTE — Progress Notes (Signed)
Pharmacy Antibiotic Note  Regina Hall is a 84 y.o. female admitted on 02/20/2018 with intra-abdominal infection.  Pharmacy has been consulted for diflucan dosing. Empiric tx for tertiary peritonitis  Plan: Diflucan 400mg  IV x 1 today,then 200mg  IV daily F/U clinical progress Transition to po therapy as indicated  Height: 5\' 6"  (167.6 cm) Weight: 241 lb 6.5 oz (109.5 kg) IBW/kg (Calculated) : 59.3  Temp (24hrs), Avg:98.2 F (36.8 C), Min:97.6 F (36.4 C), Max:99.4 F (37.4 C)  Recent Labs  Lab 03/01/18 0419 03/02/18 0448 03/03/18 0445 03/04/18 0618 03/05/18 0404  WBC 25.0* 18.3* 17.4* 20.3* 19.2*  CREATININE 1.95* 1.73* 1.53* 1.35*  1.37* 1.39*    Estimated Creatinine Clearance: 39.8 mL/min (A) (by C-G formula based on SCr of 1.39 mg/dL (H)).    Allergies  Allergen Reactions  . Penicillins Rash and Other (See Comments)    Has patient had a PCN reaction causing immediate rash, facial/tongue/throat swelling, SOB or lightheadedness with hypotension: ###Yes## Has patient had a PCN reaction causing severe rash involving mucus membranes or skin necrosis: No Has patient had a PCN reaction that required hospitalization No Has patient had a PCN reaction occurring within the last 10 years: No If all of the above answers are "NO", then may proceed with Cephalosporin use.   . Vancomycin     Itching, SOB     Antimicrobials this admission: Ertapenem 2/13 >>  Linezolid 2/17 >> Diflucan 2/19>>   Dose adjustments this admission:  Microbiology results: 2/14 Wound cx:  E. Faecium s- linezolid R-vancomycin  Thank you for allowing pharmacy to be a part of this patient's care. Isac Sarna, BS [place], [place] Clinical Pharmacist Pager [phone]  03/05/2018 12:09 PM

## 2018-03-05 NOTE — Progress Notes (Signed)
PROGRESS NOTE  Regina Hall MOQ:[id number] DOB: 05-31-1934 DOA: 02/20/2018 PCP: [name], DO   Brief History: 84 year old female with hypertension, history of left leg DVT status post IVC filter presented to the hospital with abdominal pain. CT of the abdomen pelvis shows diverticulitis with large amount of stool throughout the colon and concern for underlying colonic obstruction. Underwent sigmoidoscopy showing obstruction in the sigmoid colon with marked edema. GI and surgery following. The patient subsequently developed a new fever. Blood and urine cultures were ordered. Repeat CT abd/pelvis ordered 2/13showed free air. Case was discussed with general surgery and plans to go to OR 2/14. Pt underwent a Hartman's procedure on 02/28/18. She was transferred to ICU on ventilator post-op.  Post-op, she was hypotensive and started on neosynephrine. She also developed new onset atrial fibrillation. She was started on amiodarone IVon 02/28/18. She converted to sinus after ~5 hours. She was kept on amiodarone due to continued critical illness and high risk of reverting back to afib.  She developed fluid overload due to fluid resuscitation and was started on furosemide  Assessment/Plan: Large bowel (sigmoid) obstruction -Underwent sigmoidoscopy showing obstruction in the sigmoid colon with marked edema. Surgery following and recommend initially non-operative tx  -2/7--Underwent sigmoidoscopy showing obstruction in the sigmoid colon with marked edema. -GI recommends outpatient colonoscopy in 6 weeks for full evaluation of the colon. -Sigmoidoscopy biopsy--tubular adenoma -02/27/18 CT abd--New small amount of free intraperitoneal air, consistent with bowel perforation -case discussed with general surgery, Dr.Bridges -2/14--Hartmann procedure  Acute diverticulitiswith perforation/peritonitis -cipro/flagyl d/ced -ertapenem started 2/13 -02/27/18 CT abd--New small  amount of free intraperitoneal air, consistent with bowel perforation -case discussed with general surgery, Dr.Bridges -2/14--Hartmann procedure -TPN/enteral feeding per general surgery; slowly advancing diet. -2/16--weaned off neosynephrine -continue ertapenem, linezolid and antifungal -2/18--WBC up today likely due to solumedrol--she remains off pressors and is afebrile and weaning nicely--steroids off since 2/18 -follow WBC's trend  New Onset Atrial Fibrillation -started amiodarone drip due to hypotension 02/28/18 -converted back to sinus after 4-5 hours on amio -continue amiodarone 200 mg bid by mouth, with intention to remain on amiodarone for 1 month and follow-up with cardiology service as an outpatient. -case discussed with Dr. Harl Bowie  Acute on chronic renal failure--CKD stage 3 -baseline creatinine 0.9-1.2 -nonoliguric -consult nephrology--appreciated recommendations and inputs. -due to infectious process and hemodynamic changes -remains fluid overloaded; continue lasix IV, start weaning  Acute respiratory failure with hypoxia -due to hypoventilation, fluidoverload, bronchospasm -appreciate pulmonary service inputs -echo--EF 60-65%, mild LAE -Continue weaning oxygen supplementation -Start decreasing IV diuretics.  ??Hematemesis -RN reported multiple episodes of rust/red/brown emesis -consult GI--noted input -continuePPI bid -no further bleeding appreciated.  Bronchospasm/wheezing -continueas needed Xopenex/atrovent -continuepulmicort -2/16--repeat CXR--personally reviewed--mild improvement in interstitial markings, poor inspiration. -Repeat x-ray in a.m. -Steroids has been discontinued.  Chronic bilateral lower extremity edema -Suspect secondary to venous insufficiency/hypoalbuminemia, fluid overload -Improved and stable. -Will start decreasing IV Lasix.  Essential hypertension -Continue holding losartan in the setting of hypotension and acute kidney  injury. -Blood pressure soft but stable  Physical deconditioning PT eval-->SNF -Reports ambulating with a walker due to scoliosis prior to hospitalization.    Disposition Plan:remain in ICU, overnight.  Repeat x-ray in a.m. transition patient off amiodarone drip.  Family Communication:NoFamily at bedside  Consultants:General surgery, GI, renal  Code Status: FULL   DVT Prophylaxis: [place] Lovenox   Procedures: As Listed in Progress Note Above  Antibiotics: cipro2/7>>>2/13 Flagyl2/7>>>2/13 Ertapenem 2/13>>>> linezolid 2/17>>>  Time: 35 minutes.  More than 50% of this time was explained with face-to-face examination and bedside updates to family members.  Patient is critically ill with multiple organ system failure requiring high complexity decision making for assessment and support, frequent evaluation and titration of therapies; application of advance monitoring technologies and extensive interpretation of multiple databases   Subjective: Afebrile, in no acute distress; with good oxygen saturation on 2 L nasal cannula.  Tolerating clear liquid diet.  Objective: Vitals:   03/05/18 0819 03/05/18 0828 03/05/18 1125 03/05/18 1439  BP:      Pulse:      Resp:      Temp:   97.6 F (36.4 C)   TempSrc:   Oral   SpO2: 95% 100%  97%  Weight:      Height:        Intake/Output Summary (Last 24 hours) at 03/05/2018 1447 Last data filed at 03/05/2018 1235 Gross per 24 hour  Intake 816.61 ml  Output 2700 ml  Net -1883.39 ml   Weight change: -4 kg   Exam: General exam: Afebrile, in no major distress.  Reports no chest pain, no nausea, no vomiting.  Tolerating clear liquid diets.  Wearing 2 L nasal cannula supplementation. Respiratory system: Good air movement bilaterally, no no wheezing, no crackles on exam.  Positive for scattered rhonchi. Cardiovascular system: Rate control, sinus rhythm, no rubs, no gallops.  No JVD on exam.  Gastrointestinal system:  Abdomen is nondistended, soft and nontender. Central nervous system: Alert and oriented. No focal neurological deficits. Extremities: No cyanosis or clubbing; +1-2+ edema bilaterally.    Data Reviewed: I have personally reviewed following labs and imaging studies  Basic Metabolic Panel: Recent Labs  Lab 02/28/18 0601 03/01/18 0419 03/02/18 0448 03/03/18 0445 03/04/18 0618 03/05/18 0404  NA 133* 134* 135 139 140  141 139  K 4.5 4.3 4.9 4.0 3.3*  3.4* 3.3*  CL 96* 105 108 110 106  106 101  CO2 24 21* 18* 22 23  24 28   GLUCOSE 100* 162* 147* 148* 126*  127* 210*  BUN 31* 40* 47* 53* 54*  55* 56*  CREATININE 2.23* 1.95* 1.73* 1.53* 1.35*  1.37* 1.39*  CALCIUM 8.8* 7.7* 8.0* 8.3* 8.9  9.0 8.4*  MG 2.5* 2.4  --   --   --   --   PHOS  --  4.0  --   --  3.6 4.1   Liver Function Tests: Recent Labs  Lab 02/27/18 0552 03/03/18 0445 03/04/18 0618 03/05/18 0404  AST 17 11* 11*  --   ALT 11 9 8   --   ALKPHOS 55 33* 33*  --   BILITOT 0.8 0.3 0.6  --   PROT 5.1* 4.3* 5.6*  --   ALBUMIN 2.3* 1.8* 3.3*  3.2* 2.4*   CBC: Recent Labs  Lab 03/01/18 0419 03/02/18 0448 03/03/18 0445 03/04/18 0618 03/05/18 0404  WBC 25.0* 18.3* 17.4* 20.3* 19.2*  HGB 10.4* 8.9* 8.4* 9.5* 8.6*  HCT 31.7* 27.3* 26.3* 29.6* 27.0*  MCV 94.3 92.5 92.6 91.6 92.5  PLT 367 247 222 279 275   CBG: Recent Labs  Lab 03/04/18 1553 03/04/18 1946 03/05/18 0415 03/05/18 0749 03/05/18 1117  GLUCAP 161* 188* 178* 159* 222*   Urine analysis:    Component Value Date/Time   COLORURINE YELLOW 02/26/2018 1712   APPEARANCEUR CLEAR 02/26/2018 1712   LABSPEC 1.014 02/26/2018 1712   PHURINE 8.0 02/26/2018 1712   GLUCOSEU NEGATIVE 02/26/2018 1712  HGBUR SMALL (A) 02/26/2018 1712   BILIRUBINUR NEGATIVE 02/26/2018 1712   KETONESUR NEGATIVE 02/26/2018 1712   PROTEINUR NEGATIVE 02/26/2018 1712   UROBILINOGEN 0.2 04/21/2014 1200   NITRITE NEGATIVE 02/26/2018 1712   LEUKOCYTESUR NEGATIVE 02/26/2018  1712    Recent Results (from the past 240 hour(s))  Culture, blood (Routine X 2) w Reflex to ID Panel     Status: None   Collection Time: 02/26/18  9:25 AM  Result Value Ref Range Status   Specimen Description LEFT ANTECUBITAL  Final   Special Requests   Final    BOTTLES DRAWN AEROBIC AND ANAEROBIC Blood Culture adequate volume   Culture   Final    NO GROWTH 5 DAYS Performed at [facility], [address] [place], [place] [address]    Report Status 03/03/2018 FINAL  Final  Culture, blood (Routine X 2) w Reflex to ID Panel     Status: None   Collection Time: 02/26/18  9:33 AM  Result Value Ref Range Status   Specimen Description BLOOD LEFT HAND  Final   Special Requests   Final    BOTTLES DRAWN AEROBIC AND ANAEROBIC Blood Culture adequate volume   Culture   Final    NO GROWTH 5 DAYS Performed at [facility], [address] [place], [place] [address]    Report Status 03/03/2018 FINAL  Final  Culture, Urine     Status: None   Collection Time: 02/26/18  5:12 PM  Result Value Ref Range Status   Specimen Description   Final    URINE, RANDOM Performed at [facility], [address], [place], [place] [address]    Special Requests   Final    NONE Performed at [facility], [address], [place], [place] [address]    Culture   Final    NO GROWTH Performed at [facility], [place] [address] [place], [place] [address]    Report Status 02/28/2018 FINAL  Final  Respiratory Panel by PCR     Status: None   Collection Time: 02/27/18  8:07 AM  Result Value Ref Range Status   Adenovirus NOT DETECTED NOT DETECTED Final   Coronavirus 229E NOT DETECTED NOT DETECTED Final    Comment: (NOTE) The Coronavirus on the Respiratory Panel, DOES NOT test for the novel  Coronavirus (2019 nCoV)    Coronavirus HKU1 NOT DETECTED NOT DETECTED Final   Coronavirus NL63 NOT DETECTED NOT DETECTED Final   Coronavirus OC43 NOT DETECTED NOT DETECTED Final   Metapneumovirus NOT DETECTED NOT DETECTED  Final   Rhinovirus / Enterovirus NOT DETECTED NOT DETECTED Final   Influenza A NOT DETECTED NOT DETECTED Final   Influenza B NOT DETECTED NOT DETECTED Final   Parainfluenza Virus 1 NOT DETECTED NOT DETECTED Final   Parainfluenza Virus 2 NOT DETECTED NOT DETECTED Final   Parainfluenza Virus 3 NOT DETECTED NOT DETECTED Final   Parainfluenza Virus 4 NOT DETECTED NOT DETECTED Final   Respiratory Syncytial Virus NOT DETECTED NOT DETECTED Final   Bordetella pertussis NOT DETECTED NOT DETECTED Final   Chlamydophila pneumoniae NOT DETECTED NOT DETECTED Final   Mycoplasma pneumoniae NOT DETECTED NOT DETECTED Final    Comment: Performed at [facility], [place] [address], [place], [place] [address]  Surgical PCR screen     Status: None   Collection Time: 02/28/18  7:52 AM  Result Value Ref Range Status   MRSA, PCR NEGATIVE NEGATIVE Final   Staphylococcus aureus NEGATIVE NEGATIVE Final  Comment: (NOTE) The Xpert SA Assay (FDA approved for NASAL specimens in patients 48 years of age and older), is one component of a comprehensive surveillance program. It is not intended to diagnose infection nor to guide or monitor treatment. Performed at [facility], [address] [place], [place] [address]   Aerobic/Anaerobic Culture (surgical/deep wound)     Status: None   Collection Time: 02/28/18  2:27 PM  Result Value Ref Range Status   Specimen Description   Final    WOUND Performed at [facility], [address], [place], [place] [address]    Special Requests   Final    ABSCESS Performed at [facility], [address] [place], [place] [address]    Gram Stain   Final    RARE WBC PRESENT, PREDOMINANTLY PMN RARE GRAM POSITIVE COCCI RARE GRAM VARIABLE ROD RARE GRAM POSITIVE RODS    Culture   Final    FEW VANCOMYCIN RESISTANT ENTEROCOCCUS NO ANAEROBES ISOLATED Performed at [facility], [address], [place], [place] [address]    Report Status 03/05/2018 FINAL  Final   Organism  ID, Bacteria VANCOMYCIN RESISTANT ENTEROCOCCUS  Final      Susceptibility   Vancomycin resistant enterococcus - MIC*    AMPICILLIN >=32 RESISTANT Resistant     VANCOMYCIN >=32 RESISTANT Resistant     GENTAMICIN SYNERGY SENSITIVE Sensitive     LINEZOLID 2 SENSITIVE Sensitive     * FEW VANCOMYCIN RESISTANT ENTEROCOCCUS     Scheduled Meds: . amiodarone  200 mg Oral BID  . budesonide (PULMICORT) nebulizer solution  0.5 mg Nebulization BID  . Chlorhexidine Gluconate Cloth  6 each Topical Daily  . enoxaparin (LOVENOX) injection  40 mg Subcutaneous Q24H  . feeding supplement (ENSURE ENLIVE)  237 mL Oral BID BM  . ipratropium  0.5 mg Nebulization Q6H  . levalbuterol  1.25 mg Nebulization Q6H  . pantoprazole (PROTONIX) IV  40 mg Intravenous Q12H  . sodium chloride flush  10-40 mL Intracatheter Q12H   Continuous Infusions: . sodium chloride 10 mL/hr at 03/05/18 0836  . sodium chloride    . ertapenem 1,000 mg (03/04/18 1623)  . fluconazole (DIFLUCAN) IV 400 mg (03/05/18 1323)   Followed by  . [START ON 03/06/2018] fluconazole (DIFLUCAN) IV    . furosemide    . linezolid (ZYVOX) IV 600 mg (03/05/18 0949)    Procedures/Studies: Ct Abdomen Pelvis Wo Contrast  Result Date: 02/27/2018 CLINICAL DATA:  Follow-up diverticulitis. Chronic abdominal pain. EXAM: CT ABDOMEN AND PELVIS WITHOUT CONTRAST TECHNIQUE: Multidetector CT imaging of the abdomen and pelvis was performed following the standard protocol without IV contrast. COMPARISON:  02/20/2018 FINDINGS: Lower chest: New small bilateral pleural effusions and bibasilar atelectasis. Hepatobiliary: Stable small cyst in left hepatic lobe. No mass visualized on this unenhanced exam. Tiny gallstones again noted, however there is no evidence of cholecystitis or biliary ductal dilatation. Pancreas: No mass or inflammatory process visualized on this unenhanced exam. Spleen:  Within normal limits in size. Adrenals/Urinary tract: No evidence of urolithiasis or  hydronephrosis. Unremarkable unopacified urinary bladder. Stomach/Bowel: New small amount of free intraperitoneal air is seen, consistent with bowel perforation. Mild ascites is new since previous study, as well as diffuse mesenteric and body wall edema. Large amount of stool is again seen throughout the colon. A transition point is again seen near the rectosigmoid junction, consistent with stricture. No definite soft tissue mass appreciated by CT. Mild diverticulosis and wall thickening is seen involving the proximal sigmoid,  which is similar to previous study and suspicious for mild diverticulitis. No evidence of small bowel wall thickening or obstruction. No abscess identified. Vascular/Lymphatic: No pathologically enlarged lymph nodes identified. No evidence of abdominal aortic aneurysm. Reproductive: Prior hysterectomy. Limited visualization due to artifact from left hip prosthesis. Other:  None. Musculoskeletal:  No suspicious bone lesions identified. IMPRESSION: 1. New small amount of free intraperitoneal air, consistent with bowel perforation. 2. Large colonic stool burden with transition point near the rectosigmoid junction, consistent with stricture. No definite soft tissue mass appreciated by CT. This is unchanged in appearance compared to previous study. Recommend correlation with colonoscopy to exclude obstructing colon carcinoma. 3. Mild diverticulitis of proximal sigmoid colon, without significant change. 4. New mild ascites and diffuse body wall edema. New small bilateral pleural effusions and bibasilar atelectasis. 5. Cholelithiasis. No radiographic evidence of cholecystitis. Critical Value/emergent results were called by telephone at the time of interpretation on 02/27/2018 at 4:35 pm to Dr. Shanon Brow TAT , who verbally acknowledged these results. Electronically Signed   By: Earle Gell M.D.   On: 02/27/2018 16:38   Ct Abdomen Pelvis W Contrast  Result Date: 02/20/2018 CLINICAL DATA:  Lower abdominal  pain and constipation for the past 2 days. Clinical suspicion for diverticulitis. EXAM: CT ABDOMEN AND PELVIS WITH CONTRAST TECHNIQUE: Multidetector CT imaging of the abdomen and pelvis was performed using the standard protocol following bolus administration of intravenous contrast. CONTRAST:  178mL OMNIPAQUE IOHEXOL 300 MG/ML  SOLN COMPARISON:  Abdomen radiographs dated 02/06/2018. Abdomen and pelvis CT dated 04/08/2015. FINDINGS: Lower chest: Unremarkable. Hepatobiliary: No significant change in previously demonstrated liver cysts. Small number of tiny gallstones in the gallbladder measuring up to 4 mm in maximum diameter each. No gallbladder wall thickening or pericholecystic fluid. Pancreas: Unremarkable. No pancreatic ductal dilatation or surrounding inflammatory changes. Spleen: 5 mm oval area of low density in the spleen on image number 29 series 2, difficult to detect with certainty on the previous examination, partly due to differences in technique and timing. Adrenals/Urinary Tract: Normal appearing adrenal glands. Stable tiny exophytic upper pole left renal cyst. Normal appearing right kidney, ureters and urinary bladder. Stomach/Bowel: Large amount of stool throughout the colon to the level of the distal sigmoid colon where there is a short segment of concentric, medium density wall thickening, best seen on image number 72 series 2. The wall thickening is concentric and lower in density on coronal image number 90. Lesser amount of stool and gas in the normal caliber rectum. Minimal sigmoid colon diverticulosis. Mild pericolonic soft tissue stranding involving the distal sigmoid colon, most pronounced at the level of maximal distension of the colon by the stool. Normal appearing stomach, small bowel and appendix. Vascular/Lymphatic: Atheromatous arterial calcifications without aneurysm. No enlarged lymph nodes. Inferior vena cava filter with its tip just above the level of the renal veins. Reproductive:  Status post hysterectomy. No adnexal masses. Other: Small amount of free peritoneal fluid in the pelvis. Musculoskeletal: Left hip prosthesis. Severe right hip degenerative changes with severe joint space narrowing, extensive subarticular cyst formation, spur formation and bony remodeling. Moderate levoconvex thoracolumbar scoliosis. Interbody and pedicle screw and rod fusion at the L4-5 level. Fusion of portions of the T12 and L1 vertebral bodies. Lumbar and lower thoracic spine degenerative changes. IMPRESSION: 1. Large amount of stool throughout the colon to the level of the distal sigmoid colon where there is a short segment of concentric, medium density wall thickening. This is concerning for an obstructing short segment mass.  It would be unusual for edema due to diverticulitis to involve that short of a segment of bowel. Correlation with sigmoidoscopy or colonoscopy is recommended. 2. Mild pericolonic soft tissue stranding involving the distal sigmoid colon, most pronounced at the level of maximal distension of the colon by the stool. This is suspicious for mild diverticulitis without abscess. There is minimal diverticulosis in that region of the colon. 3. Cholelithiasis. 4. 5 mm probable hemangioma in the spleen. Electronically Signed   By: Claudie Revering M.D.   On: 02/20/2018 20:59   Dg Chest Port 1 View  Result Date: 03/04/2018 CLINICAL DATA:  Pulmonary edema. EXAM: PORTABLE CHEST 1 VIEW COMPARISON:  03/03/2018 FINDINGS: 0538 hours. Endotracheal tube tip is 5.1 cm above the base of the carina. NG tube passes into the stomach. Right PICC line tip overlies the distal SVC level. Lung volumes are low. The cardio pericardial silhouette is enlarged. There is pulmonary vascular congestion without overt pulmonary edema. Basilar atelectasis noted bilaterally with tiny bilateral pleural effusions. IMPRESSION: Low lung volumes with cardiomegaly, vascular congestion and tiny effusions. Electronically Signed   By:  Misty Stanley M.D.   On: 03/04/2018 08:39   Dg Chest Port 1 View  Result Date: 03/03/2018 CLINICAL DATA:  Pulmonary edema. EXAM: PORTABLE CHEST 1 VIEW COMPARISON:  Radiograph of March 02, 2018. FINDINGS: Stable cardiomediastinal silhouette. Atherosclerosis of thoracic aorta is noted. Endotracheal and nasogastric tubes are unchanged in position. Right-sided PICC line is unchanged in position. Stable hypoinflation of the lungs is noted with associated bibasilar atelectasis and small pleural effusions. No pneumothorax is noted. Severe degenerative changes seen involving the left glenohumeral joint. IMPRESSION: Stable support apparatus. Stable hypoinflation of the lungs with mild bibasilar subsegmental atelectasis and small pleural effusions. Aortic Atherosclerosis (ICD10-I70.0). Electronically Signed   By: Marijo Conception, M.D.   On: 03/03/2018 08:27   Dg Chest Port 1 View  Result Date: 03/02/2018 CLINICAL DATA:  Hypoxia EXAM: PORTABLE CHEST 1 VIEW COMPARISON:  March 01, 2018 FINDINGS: Endotracheal tube tip is 3.7 cm above the carina. Nasogastric tube tip and side port are in the stomach. Right subclavian catheter tip is at the cavoatrial junction. No pneumothorax. There is cardiomegaly with pulmonary venous hypertension. There are small pleural effusions bilaterally. There is atelectatic change in the lung bases. A degree of consolidation in the left base is questioned. There is advanced arthropathy in the left shoulder. IMPRESSION: Tube and catheter positions as described without pneumothorax. Pulmonary vascular congestion present with small pleural effusions bilaterally. There is bibasilar atelectasis with a questionable degree of consolidation in the left base. Aortic Atherosclerosis (ICD10-I70.0). Electronically Signed   By: Lowella Grip III M.D.   On: 03/02/2018 07:38   Dg Chest Port 1 View  Result Date: 03/01/2018 CLINICAL DATA:  Ventilator dependent respiratory failure. Follow-up  pulmonary edema. EXAM: PORTABLE CHEST 1 VIEW COMPARISON:  02/28/2018, 02/28/2016 and earlier. FINDINGS: Endotracheal tube tip in satisfactory position projecting approximately 3-4 cm above the carina. RIGHT arm PICC tip projects at or near the cavoatrial junction, unchanged. Nasogastric tube looped in the stomach with its tip in the fundus. Markedly suboptimal inspiration with worsening atelectasis in the lung bases, LEFT greater than RIGHT. Resolution of interstitial pulmonary edema. Persistent pulmonary venous hypertension. IMPRESSION: 1.  Support apparatus satisfactory. 2. Resolution of interstitial pulmonary edema since yesterday, though pulmonary venous hypertension persists. 3. Worsening bibasilar atelectasis, LEFT greater than RIGHT. Electronically Signed   By: Evangeline Dakin M.D.   On: 03/01/2018 09:01   Dg  Chest Port 1 View  Result Date: 02/28/2018 CLINICAL DATA:  Intubation. EXAM: PORTABLE CHEST 1 VIEW COMPARISON:  02/26/2018 FINDINGS: The endotracheal tube is 3.3 cm above the carina. There is an NG tube coursing down the esophagus and into the stomach. The right-sided PICC line is stable. Significant worsening lung aeration with very low lung volumes, vascular crowding, pulmonary edema and basilar atelectasis. The heart remains enlarged and there is tortuosity and calcification of the thoracic aorta. IMPRESSION: 1. The endotracheal tube and NG tubes are in good position. 2. Very low lung volumes with vascular crowding and atelectasis 3. Vascular congestion and pulmonary edema. Electronically Signed   By: Marijo Sanes M.D.   On: 02/28/2018 16:37   Dg Chest Port 1 View  Result Date: 02/26/2018 CLINICAL DATA:  84 year old female with intermittent productive cough EXAM: PORTABLE CHEST 1 VIEW COMPARISON:  02/28/2016 FINDINGS: Cardiomediastinal silhouette unchanged. Interval development of asymmetric elevation the right hemidiaphragm. No pneumothorax. Low lung volumes with coarsened interstitial  markings and interlobular septal thickening. No large pleural effusion. No confluent airspace disease. Interval placement of right upper extremity PICC with the tip appearing to terminate superior vena cava. Degenerative changes of the left glenohumeral joint. IMPRESSION: Low lung volumes with questionable edema. New asymmetric elevation the right hemidiaphragm, uncertain significance. Right upper extremity PICC. Electronically Signed   By: Corrie Mckusick D.O.   On: 02/26/2018 19:28   Dg Abd 2 Views  Result Date: 02/06/2018 CLINICAL DATA:  IVC filter placement evaluation. EXAM: ABDOMEN - 2 VIEW COMPARISON:  03/29/2017. FINDINGS: Lumbar spine numbered as per prior exam. IVC filter noted with tip at approximately the L1 level. Degenerative changes and scoliosis thoracic spine. L4-L5 fusion. Rounded calcific densities noted over the right upper quadrant, these could represent gallstones, kidney stones, undigested pill fragments. No bowel distention or free air. Stool noted throughout the colon and rectum. IMPRESSION: IVC filter noted with proximal tip at the L1 level. Electronically Signed   By: Marcello Moores  Register   On: 02/06/2018 14:21   [place] Ekg Site Rite  Result Date: 02/22/2018 If Site Rite image not attached, placement could not be confirmed due to current cardiac rhythm.   Barton Dubois, MD  [facility] Pager [phone] 03/05/2018, 2:47 PM   LOS: 13 days

## 2018-03-05 NOTE — Consult Note (Signed)
[place] Nurse ostomy follow up Patient receiving care in [facility] ICU 08. Teaching session on ostomy care included the patient's daughter, son, sister, brother in law and spouse.  Many thoughtful, insightful questions asked by the family members.  All were answered to their expressed satisfaction. Stoma type/location: LUQ colostomy Stomal assessment/size: oval, 1 5/8 inch retracted below skin level.  It looks like at the 9 o'clock position there is the beginning of mucocutaneous separation. Peristomal assessment: skin intact Treatment options for stomal/peristomal skin: barrier ring.  I have entered an order for flexible, convex pouch.  She may need a belt in the future Output: gas, soft brown stool  Ostomy pouching: 2pc.  Education provided: Change process, use of convex pouch, how to get additional supplies. Each member of the family demonstrated how to open/close and verbalized how to empty pouch and steps of change process. Enrolled patient in [place] Start Discharge program: Yes Val Riles, RN, MSN, [facility], CNS-BC, pager [phone]

## 2018-03-05 NOTE — Progress Notes (Signed)
[facility] Progress Note    Assessment/ Plan:    77F presented 2/6 with abd pain and constipation found to have colonic obstruction complicated by perforation.  Had ex-lap 2/14 and was found to have fecal contamination.  S/p diverting ostomy and had acute hypoxic respiratory failure, volume overload.  Was extubated yesterday 2/18 and is still undergoing aggressive diuresis.     1.  Colonic perforation s/p diverting ostomy: 2/14 with surgery.  On ertapenem, fluconazole, linezolid.  OR cultures grew VRE.  Per surg and primary  2.  Acute hypoxic RF:  Extubated yesterday, now on [place].  Doing well.  3.  Volume overload: continuing aggressive diuresis with Lasix- nearly 5L UOP yesterday so will reduce to 120 IV BID and K supplementation.  Need to get vol status better so everythign will heal.  4.  Afib: on amiodarone  5.  H/o DVT: has IVC filter  6.  Dispo: remains in SD  Subjective:    Ate some breakfast this AM (liquids).  S/p extubation.  Robust urine output.     Objective:   BP (!) 131/47   Pulse 65   Temp 97.6 F (36.4 C) (Oral)   Resp 18   Ht 5\' 6"  (1.676 m)   Wt 109.5 kg   SpO2 100%   BMI 38.96 kg/m   Intake/Output Summary (Last 24 hours) at 03/05/2018 2706 Last data filed at 03/05/2018 0500 Gross per 24 hour  Intake 1027.32 ml  Output 5100 ml  Net -4072.68 ml   Weight change: -4 kg  Physical Exam: Gen: older woman, NAD CVS: RRR Resp: clear anteriorly, diminished posteriorly Abd: distended, + abd wall edema, incision with staples, boggy tissue, some serous discharge, + ostomy pink Ext: 3+ anasarca  Imaging: Dg Chest Port 1 View  Result Date: 03/04/2018 CLINICAL DATA:  Pulmonary edema. EXAM: PORTABLE CHEST 1 VIEW COMPARISON:  03/03/2018 FINDINGS: 0538 hours. Endotracheal tube tip is 5.1 cm above the base of the carina. NG tube passes into the stomach. Right PICC line tip overlies the distal SVC level. Lung volumes are low. The cardio pericardial  silhouette is enlarged. There is pulmonary vascular congestion without overt pulmonary edema. Basilar atelectasis noted bilaterally with tiny bilateral pleural effusions. IMPRESSION: Low lung volumes with cardiomegaly, vascular congestion and tiny effusions. Electronically Signed   By: Misty Stanley M.D.   On: 03/04/2018 08:39    Labs: BMET Recent Labs  Lab 02/27/18 0552 02/28/18 0601 03/01/18 0419 03/02/18 0448 03/03/18 0445 03/04/18 0618 03/05/18 0404  NA 135 133* 134* 135 139 140  141 139  K 3.9 4.5 4.3 4.9 4.0 3.3*  3.4* 3.3*  CL 102 96* 105 108 110 106  106 101  CO2 23 24 21* 18* 22 23  24 28   GLUCOSE 158* 100* 162* 147* 148* 126*  127* 210*  BUN 17 31* 40* 47* 53* 54*  55* 56*  CREATININE 1.21* 2.23* 1.95* 1.73* 1.53* 1.35*  1.37* 1.39*  CALCIUM 8.5* 8.8* 7.7* 8.0* 8.3* 8.9  9.0 8.4*  PHOS  --   --  4.0  --   --  3.6 4.1   CBC Recent Labs  Lab 03/02/18 0448 03/03/18 0445 03/04/18 0618 03/05/18 0404  WBC 18.3* 17.4* 20.3* 19.2*  HGB 8.9* 8.4* 9.5* 8.6*  HCT 27.3* 26.3* 29.6* 27.0*  MCV 92.5 92.6 91.6 92.5  PLT 247 222 279 275    Medications:    . budesonide (PULMICORT) nebulizer solution  0.5 mg Nebulization BID  .  Chlorhexidine Gluconate Cloth  6 each Topical Daily  . enoxaparin (LOVENOX) injection  40 mg Subcutaneous Q24H  . ipratropium  0.5 mg Nebulization Q6H  . levalbuterol  1.25 mg Nebulization Q6H  . pantoprazole (PROTONIX) IV  40 mg Intravenous Q12H  . sodium chloride flush  10-40 mL Intracatheter Q12H      Madelon Lips MD [facility] pgr [phone] 03/05/2018, 9:07 AM

## 2018-03-06 DIAGNOSIS — Z01818 Encounter for other preprocedural examination: Secondary | ICD-10-CM

## 2018-03-06 LAB — CBC
HEMATOCRIT: 26.7 % — AB (ref 36.0–46.0)
Hemoglobin: 8.5 g/dL — ABNORMAL LOW (ref 12.0–15.0)
MCH: 29.6 pg (ref 26.0–34.0)
MCHC: 31.8 g/dL (ref 30.0–36.0)
MCV: 93 fL (ref 80.0–100.0)
Platelets: 278 10*3/uL (ref 150–400)
RBC: 2.87 MIL/uL — ABNORMAL LOW (ref 3.87–5.11)
RDW: 14.7 % (ref 11.5–15.5)
WBC: 17 10*3/uL — AB (ref 4.0–10.5)
nRBC: 0 % (ref 0.0–0.2)

## 2018-03-06 LAB — RENAL FUNCTION PANEL
Albumin: 2 g/dL — ABNORMAL LOW (ref 3.5–5.0)
Anion gap: 9 (ref 5–15)
BUN: 48 mg/dL — ABNORMAL HIGH (ref 8–23)
CO2: 28 mmol/L (ref 22–32)
Calcium: 8 mg/dL — ABNORMAL LOW (ref 8.9–10.3)
Chloride: 102 mmol/L (ref 98–111)
Creatinine, Ser: 1.38 mg/dL — ABNORMAL HIGH (ref 0.44–1.00)
GFR calc Af Amer: 41 mL/min — ABNORMAL LOW (ref >60)
GFR calc non Af Amer: 36 mL/min — ABNORMAL LOW (ref >60)
Glucose, Bld: 184 mg/dL — ABNORMAL HIGH (ref 70–99)
Phosphorus: 3.1 mg/dL (ref 2.5–4.6)
Potassium: 3.2 mmol/L — ABNORMAL LOW (ref 3.5–5.1)
Sodium: 139 mmol/L (ref 135–145)

## 2018-03-06 LAB — GLUCOSE, CAPILLARY
Glucose-Capillary: 187 mg/dL — ABNORMAL HIGH (ref 70–99)
Glucose-Capillary: 170 mg/dL — ABNORMAL HIGH (ref 70–99)
Glucose-Capillary: 129 mg/dL — ABNORMAL HIGH (ref 70–99)
Glucose-Capillary: 152 mg/dL — ABNORMAL HIGH (ref 70–99)
Glucose-Capillary: 144 mg/dL — ABNORMAL HIGH (ref 70–99)
Glucose-Capillary: 142 mg/dL — ABNORMAL HIGH (ref 70–99)

## 2018-03-06 MED ORDER — IPRATROPIUM BROMIDE 0.02 % IN SOLN
0.5000 mg | Freq: Three times a day (TID) | RESPIRATORY_TRACT | Status: DC
  Administered 2018-03-07 – 2018-03-12 (×15): 0.5 mg via RESPIRATORY_TRACT
  Filled 2018-03-06 (×15): qty 2.5

## 2018-03-06 MED ORDER — LEVALBUTEROL HCL 1.25 MG/0.5ML IN NEBU
1.2500 mg | INHALATION_SOLUTION | Freq: Three times a day (TID) | RESPIRATORY_TRACT | Status: DC
  Administered 2018-03-07 – 2018-03-12 (×14): 1.25 mg via RESPIRATORY_TRACT
  Filled 2018-03-06 (×16): qty 0.5

## 2018-03-06 MED ORDER — PANTOPRAZOLE SODIUM 40 MG PO TBEC
40.0000 mg | DELAYED_RELEASE_TABLET | Freq: Every day | ORAL | Status: DC
  Administered 2018-03-06 – 2018-03-13 (×6): 40 mg via ORAL
  Filled 2018-03-06 (×5): qty 1

## 2018-03-06 MED ORDER — FUROSEMIDE 10 MG/ML IJ SOLN
60.0000 mg | Freq: Two times a day (BID) | INTRAMUSCULAR | Status: DC
  Administered 2018-03-06 – 2018-03-08 (×4): 60 mg via INTRAVENOUS
  Filled 2018-03-06 (×4): qty 6

## 2018-03-06 MED ORDER — POTASSIUM CHLORIDE CRYS ER 20 MEQ PO TBCR
40.0000 meq | EXTENDED_RELEASE_TABLET | Freq: Every day | ORAL | Status: DC
  Administered 2018-03-06 – 2018-03-13 (×7): 40 meq via ORAL
  Filled 2018-03-06 (×8): qty 2

## 2018-03-06 MED ORDER — LEVALBUTEROL HCL 0.63 MG/3ML IN NEBU
0.6300 mg | INHALATION_SOLUTION | Freq: Four times a day (QID) | RESPIRATORY_TRACT | Status: DC | PRN
  Administered 2018-03-11: 0.63 mg via RESPIRATORY_TRACT

## 2018-03-06 NOTE — Progress Notes (Signed)
Subjective: She says she feels better.  She has no new complaints.  Her breathing is okay.  She has had some wheezing but she says she feels better.  She had substantial third spacing of fluid because of fluid resuscitation and that has improved.her pathology is positive for cancer .  This is apparently omental  objective: Vital signs in last 24 hours: Temp:  [97.3 F (36.3 C)-98.4 F (36.9 C)] 97.7 F (36.5 C) (02/20 0736) Pulse Rate:  [63-76] 68 (02/20 0800) Resp:  [19-26] 22 (02/20 0800) BP: (97-118)/(43-54) 117/53 (02/20 0800) SpO2:  [94 %-100 %] 96 % (02/20 0800) Weight:  [110.2 kg] 110.2 kg (02/20 0500) Weight change: 0.7 kg Last BM Date: 03/05/18  Intake/Output from previous day: 02/19 0701 - 02/20 0700 In: 480 [P.O.:480] Out: 450 [Stool:450]  PHYSICAL EXAM General appearance: alert, cooperative and no distress Resp: rhonchi bilaterally Cardio: regular rate and rhythm, S1, S2 normal, no murmur, click, rub or gallop GI: soft, non-tender; bowel sounds normal; no masses,  no organomegaly Extremities: She still has some swelling but much less than previously  Lab Results:  Results for orders placed or performed during the hospital encounter of 02/20/18 (from the past 48 hour(s))  Glucose, capillary     Status: Abnormal   Collection Time: 03/04/18 11:13 AM  Result Value Ref Range   Glucose-Capillary 170 (H) 70 - 99 mg/dL  Glucose, capillary     Status: Abnormal   Collection Time: 03/04/18  3:53 PM  Result Value Ref Range   Glucose-Capillary 161 (H) 70 - 99 mg/dL   Comment 1 Notify RN    Comment 2 Document in Chart   Glucose, capillary     Status: Abnormal   Collection Time: 03/04/18  7:46 PM  Result Value Ref Range   Glucose-Capillary 188 (H) 70 - 99 mg/dL   Comment 1 Notify RN    Comment 2 Document in Chart   Renal function panel     Status: Abnormal   Collection Time: 03/05/18  4:04 AM  Result Value Ref Range   Sodium 139 135 - 145 mmol/L   Potassium 3.3 (L)  3.5 - 5.1 mmol/L   Chloride 101 98 - 111 mmol/L   CO2 28 22 - 32 mmol/L   Glucose, Bld 210 (H) 70 - 99 mg/dL   BUN 56 (H) 8 - 23 mg/dL   Creatinine, Ser 1.39 (H) 0.44 - 1.00 mg/dL   Calcium 8.4 (L) 8.9 - 10.3 mg/dL   Phosphorus 4.1 2.5 - 4.6 mg/dL   Albumin 2.4 (L) 3.5 - 5.0 g/dL   GFR calc non Af Amer 35 (L) >60 mL/min   GFR calc Af Amer 41 (L) >60 mL/min   Anion gap 10 5 - 15    Comment: Performed at [facility], [address], [place], [place] [address]  CBC     Status: Abnormal   Collection Time: 03/05/18  4:04 AM  Result Value Ref Range   WBC 19.2 (H) 4.0 - 10.5 K/uL   RBC 2.92 (L) 3.87 - 5.11 MIL/uL   Hemoglobin 8.6 (L) 12.0 - 15.0 g/dL   HCT 27.0 (L) 36.0 - 46.0 %   MCV 92.5 80.0 - 100.0 fL   MCH 29.5 26.0 - 34.0 pg   MCHC 31.9 30.0 - 36.0 g/dL   RDW 14.5 11.5 - 15.5 %   Platelets 275 150 - 400 K/uL   nRBC 0.1 0.0 - 0.2 %    Comment: Performed at [facility],  [address] [place], [place] [address]  Glucose, capillary     Status: Abnormal   Collection Time: 03/05/18  4:15 AM  Result Value Ref Range   Glucose-Capillary 178 (H) 70 - 99 mg/dL  Glucose, capillary     Status: Abnormal   Collection Time: 03/05/18  7:49 AM  Result Value Ref Range   Glucose-Capillary 159 (H) 70 - 99 mg/dL   Comment 1 Notify RN   Glucose, capillary     Status: Abnormal   Collection Time: 03/05/18 11:17 AM  Result Value Ref Range   Glucose-Capillary 222 (H) 70 - 99 mg/dL  Glucose, capillary     Status: Abnormal   Collection Time: 03/05/18  4:15 PM  Result Value Ref Range   Glucose-Capillary 207 (H) 70 - 99 mg/dL   Comment 1 Notify RN    Comment 2 Document in Chart   Glucose, capillary     Status: Abnormal   Collection Time: 03/06/18  1:31 AM  Result Value Ref Range   Glucose-Capillary 187 (H) 70 - 99 mg/dL  Glucose, capillary     Status: Abnormal   Collection Time: 03/06/18  4:11 AM  Result Value Ref Range   Glucose-Capillary 170 (H) 70 - 99 mg/dL  Renal function panel      Status: Abnormal   Collection Time: 03/06/18  4:15 AM  Result Value Ref Range   Sodium 139 135 - 145 mmol/L   Potassium 3.2 (L) 3.5 - 5.1 mmol/L   Chloride 102 98 - 111 mmol/L   CO2 28 22 - 32 mmol/L   Glucose, Bld 184 (H) 70 - 99 mg/dL   BUN 48 (H) 8 - 23 mg/dL   Creatinine, Ser 1.38 (H) 0.44 - 1.00 mg/dL   Calcium 8.0 (L) 8.9 - 10.3 mg/dL   Phosphorus 3.1 2.5 - 4.6 mg/dL   Albumin 2.0 (L) 3.5 - 5.0 g/dL   GFR calc non Af Amer 36 (L) >60 mL/min   GFR calc Af Amer 41 (L) >60 mL/min   Anion gap 9 5 - 15    Comment: Performed at [facility], [address], [place], [place] [address]  CBC     Status: Abnormal   Collection Time: 03/06/18  4:15 AM  Result Value Ref Range   WBC 17.0 (H) 4.0 - 10.5 K/uL   RBC 2.87 (L) 3.87 - 5.11 MIL/uL   Hemoglobin 8.5 (L) 12.0 - 15.0 g/dL   HCT 26.7 (L) 36.0 - 46.0 %   MCV 93.0 80.0 - 100.0 fL   MCH 29.6 26.0 - 34.0 pg   MCHC 31.8 30.0 - 36.0 g/dL   RDW 14.7 11.5 - 15.5 %   Platelets 278 150 - 400 K/uL   nRBC 0.0 0.0 - 0.2 %    Comment: Performed at [facility] [address] [place], [place] [address]  Glucose, capillary     Status: Abnormal   Collection Time: 03/06/18  8:04 AM  Result Value Ref Range   Glucose-Capillary 129 (H) 70 - 99 mg/dL    ABGS Recent Labs    03/04/18 0610  PHART 7.413  PO2ART 89.9  HCO3 25.3   CULTURES Recent Results (from the past 240 hour(s))  Culture, blood (Routine X 2) w Reflex to ID Panel     Status: None   Collection Time: 02/26/18  9:25 AM  Result Value Ref Range Status   Specimen Description LEFT ANTECUBITAL  Final   Special Requests   Final    BOTTLES  DRAWN AEROBIC AND ANAEROBIC Blood Culture adequate volume   Culture   Final    NO GROWTH 5 DAYS Performed at [facility], [address] [place], [place] [address]    Report Status 03/03/2018 FINAL  Final  Culture, blood (Routine X 2) w Reflex to ID Panel     Status: None   Collection Time: 02/26/18  9:33 AM  Result Value Ref Range Status    Specimen Description BLOOD LEFT HAND  Final   Special Requests   Final    BOTTLES DRAWN AEROBIC AND ANAEROBIC Blood Culture adequate volume   Culture   Final    NO GROWTH 5 DAYS Performed at [facility], [address], [place], [place] [address]    Report Status 03/03/2018 FINAL  Final  Culture, Urine     Status: None   Collection Time: 02/26/18  5:12 PM  Result Value Ref Range Status   Specimen Description   Final    URINE, RANDOM Performed at [facility], [address] [place], [place] [address]    Special Requests   Final    NONE Performed at [facility], [address] [place], [place] [address]    Culture   Final    NO GROWTH Performed at [facility], [place] [address], [place], [place] [address]    Report Status 02/28/2018 FINAL  Final  Respiratory Panel by PCR     Status: None   Collection Time: 02/27/18  8:07 AM  Result Value Ref Range Status   Adenovirus NOT DETECTED NOT DETECTED Final   Coronavirus 229E NOT DETECTED NOT DETECTED Final    Comment: (NOTE) The Coronavirus on the Respiratory Panel, DOES NOT test for the novel  Coronavirus (2019 nCoV)    Coronavirus HKU1 NOT DETECTED NOT DETECTED Final   Coronavirus NL63 NOT DETECTED NOT DETECTED Final   Coronavirus OC43 NOT DETECTED NOT DETECTED Final   Metapneumovirus NOT DETECTED NOT DETECTED Final   Rhinovirus / Enterovirus NOT DETECTED NOT DETECTED Final   Influenza A NOT DETECTED NOT DETECTED Final   Influenza B NOT DETECTED NOT DETECTED Final   Parainfluenza Virus 1 NOT DETECTED NOT DETECTED Final   Parainfluenza Virus 2 NOT DETECTED NOT DETECTED Final   Parainfluenza Virus 3 NOT DETECTED NOT DETECTED Final   Parainfluenza Virus 4 NOT DETECTED NOT DETECTED Final   Respiratory Syncytial Virus NOT DETECTED NOT DETECTED Final   Bordetella pertussis NOT DETECTED NOT DETECTED Final   Chlamydophila pneumoniae NOT DETECTED NOT DETECTED Final   Mycoplasma pneumoniae NOT DETECTED NOT DETECTED Final     Comment: Performed at [facility], [place] [address] [place], [place] [address]  Surgical PCR screen     Status: None   Collection Time: 02/28/18  7:52 AM  Result Value Ref Range Status   MRSA, PCR NEGATIVE NEGATIVE Final   Staphylococcus aureus NEGATIVE NEGATIVE Final    Comment: (NOTE) The Xpert SA Assay (FDA approved for NASAL specimens in patients 49 years of age and older), is one component of a comprehensive surveillance program. It is not intended to diagnose infection nor to guide or monitor treatment. Performed at [facility], [address] [place], [place] [address]   Aerobic/Anaerobic Culture (surgical/deep wound)     Status: None   Collection Time: 02/28/18  2:27 PM  Result Value Ref Range Status   Specimen Description   Final    WOUND Performed at [facility], [address] [place], [place] [address]  Special Requests   Final    ABSCESS Performed at [facility], [address], [place], [place] [address]    Gram Stain   Final    RARE WBC PRESENT, PREDOMINANTLY PMN RARE GRAM POSITIVE COCCI RARE GRAM VARIABLE ROD RARE GRAM POSITIVE RODS    Culture   Final    FEW VANCOMYCIN RESISTANT ENTEROCOCCUS NO ANAEROBES ISOLATED Performed at [facility], [address], [place], [place] [address]    Report Status 03/05/2018 FINAL  Final   Organism ID, Bacteria VANCOMYCIN RESISTANT ENTEROCOCCUS  Final      Susceptibility   Vancomycin resistant enterococcus - MIC*    AMPICILLIN >=32 RESISTANT Resistant     VANCOMYCIN >=32 RESISTANT Resistant     GENTAMICIN SYNERGY SENSITIVE Sensitive     LINEZOLID 2 SENSITIVE Sensitive     * FEW VANCOMYCIN RESISTANT ENTEROCOCCUS   Studies/Results: No results found.  Medications:  Prior to Admission:  Medications Prior to Admission  Medication Sig Dispense Refill Last Dose  . aspirin EC 81 MG tablet Take 81 mg by mouth daily.   02/20/2018 at Unknown time  . Biotin 1 MG CAPS Take by mouth.   02/19/2018 at Unknown time  .  Cholecalciferol (VITAMIN D3) 5000 units TABS Take 5,000 Units by mouth daily.   02/19/2018 at Unknown time  . CRANBERRY PO Take by mouth.   02/20/2018 at Unknown time  . Docusate Sodium (STOOL SOFTENER LAXATIVE PO) Take 2 tablets by mouth every 6 (six) hours as needed.   02/20/2018 at Unknown time  . furosemide (LASIX) 20 MG tablet Take 40mg  twice daily, at 6am and 2pm 120 tablet 5 02/19/2018 at Unknown time  . GLUCOSAMINE SULFATE PO Take 2 tablets by mouth daily.    02/19/2018 at Unknown time  . Hypromellose (ARTIFICIAL TEARS OP) Place 2 drops into both eyes daily as needed (for dry eyes).   02/19/2018 at Unknown time  . losartan (COZAAR) 25 MG tablet TAKE 1 TABLET(25 MG) BY MOUTH EVERY MORNING 90 tablet 1 02/20/2018 at Unknown time  . Magnesium 250 MG TABS Take by mouth.   02/19/2018 at Unknown time  . magnesium hydroxide (MILK OF MAGNESIA) 400 MG/5ML suspension Take 15 mLs by mouth daily as needed for mild constipation. 355 mL 0 02/20/2018 at Unknown time  . montelukast (SINGULAIR) 10 MG tablet Take 10 mg by mouth every morning.  0 02/19/2018 at Unknown time  . polyethylene glycol powder (GLYCOLAX/MIRALAX) powder Take 17 g by mouth 2 (two) times daily as needed. 3350 g 1 02/20/2018 at Unknown time  . vitamin B-12 (CYANOCOBALAMIN) 500 MCG tablet Take 500 mcg by mouth daily.   02/19/2018 at Unknown time  . HYDROcodone-acetaminophen (NORCO) 10-325 MG tablet Take 1 tablet by mouth every 6 (six) hours as needed. 20 tablet 0 unknown   Scheduled: . amiodarone  200 mg Oral BID  . budesonide (PULMICORT) nebulizer solution  0.5 mg Nebulization BID  . Chlorhexidine Gluconate Cloth  6 each Topical Daily  . enoxaparin (LOVENOX) injection  40 mg Subcutaneous Q24H  . feeding supplement (ENSURE ENLIVE)  237 mL Oral BID BM  . furosemide  20 mg Intravenous BID  . ipratropium  0.5 mg Nebulization Q6H  . levalbuterol  1.25 mg Nebulization Q6H  . pantoprazole (PROTONIX) IV  40 mg Intravenous Q12H  . sodium chloride flush  10-40 mL  Intracatheter Q12H   Continuous: . sodium chloride 10 mL/hr at 03/05/18 0836  . sodium chloride    .  ertapenem 1,000 mg (03/05/18 1610)  . fluconazole (DIFLUCAN) IV    . linezolid (ZYVOX) IV 600 mg (03/05/18 2219)   PTW:SFKCL/EXNTZGYF arterial line **AND** sodium chloride, acetaminophen **OR** acetaminophen, hydrALAZINE, HYDROmorphone (DILAUDID) injection, iohexol, iopamidol, levalbuterol, LORazepam, ondansetron **OR** ondansetron (ZOFRAN) IV, oxyCODONE-acetaminophen, phenol, sodium chloride flush, sodium chloride flush  Assesment: She was admitted with large bowel obstruction and eventually had perforation.  She underwent surgery with creation of a colostomy.  She required ventilator support postop but was able to come off the ventilator about 48 hours ago.  She was hypotensive and required pressor support but has been off of that.  She had atrial fib and is in sinus rhythm now  She has had trouble with wheezing. Active Problems:   Mass of colon   Abnormal CT scan, sigmoid colon   Diverticulitis of large intestine without perforation or abscess without bleeding   Large bowel obstruction (HCC)   Constipation   Fever   AKI (acute kidney injury) ([place])   Diverticulitis of large intestine with perforation   Bowel perforation (HCC)   Fecal peritonitis (HCC)   Bronchospasm   Pulmonary edema    Plan: Continue current treatments.  I will plan to follow more peripherally.  Thanks for allowing me to participate in her care    LOS: 14 days   Alonza Bogus 03/06/2018, 8:28 AM

## 2018-03-06 NOTE — Progress Notes (Signed)
6 Days Post-Op  Subjective: Resting comfortably.  Objective: Vital signs in last 24 hours: Temp:  [97.3 F (36.3 C)-98.4 F (36.9 C)] 97.7 F (36.5 C) (02/20 0736) Pulse Rate:  [63-76] 68 (02/20 0800) Resp:  [19-26] 22 (02/20 0800) BP: (97-118)/(43-54) 117/53 (02/20 0800) SpO2:  [94 %-100 %] 100 % (02/20 0843) Weight:  [110.2 kg] 110.2 kg (02/20 0500) Last BM Date: 03/05/18  Intake/Output from previous day: 02/19 0701 - 02/20 0700 In: 480 [P.O.:480] Out: 450 [Stool:450] Intake/Output this shift: No intake/output data recorded.  General appearance: cooperative and no distress GI: Soft, incision without purulent drainage.  Ostomy pink and patent.  Lab Results:  Recent Labs    03/05/18 0404 03/06/18 0415  WBC 19.2* 17.0*  HGB 8.6* 8.5*  HCT 27.0* 26.7*  PLT 275 278   BMET Recent Labs    03/05/18 0404 03/06/18 0415  NA 139 139  K 3.3* 3.2*  CL 101 102  CO2 28 28  GLUCOSE 210* 184*  BUN 56* 48*  CREATININE 1.39* 1.38*  CALCIUM 8.4* 8.0*   PT/INR No results for input(s): LABPROT, INR in the last 72 hours.  Studies/Results: No results found.  Anti-infectives: Anti-infectives (From admission, onward)   Start     Dose/Rate Route Frequency Ordered Stop   03/06/18 1300  fluconazole (DIFLUCAN) IVPB 200 mg     200 mg 100 mL/hr over 60 Minutes Intravenous Every 24 hours 03/05/18 1216     03/05/18 1300  fluconazole (DIFLUCAN) IVPB 400 mg     400 mg 100 mL/hr over 120 Minutes Intravenous  Once 03/05/18 1216 03/05/18 1523   03/03/18 1700  meropenem (MERREM) 1 g in sodium chloride 0.9 % 100 mL IVPB  Status:  Discontinued     1 g 200 mL/hr over 30 Minutes Intravenous Every 12 hours 03/03/18 1000 03/03/18 1507   03/03/18 1700  ertapenem (INVANZ) 1,000 mg in sodium chloride 0.9 % 100 mL IVPB     1 g 200 mL/hr over 30 Minutes Intravenous Every 24 hours 03/03/18 1507     03/03/18 1600  linezolid (ZYVOX) IVPB 600 mg     600 mg 300 mL/hr over 60 Minutes Intravenous  Every 12 hours 03/03/18 1507     03/02/18 1800  ertapenem (INVANZ) 1,000 mg in sodium chloride 0.9 % 100 mL IVPB  Status:  Discontinued     1 g 200 mL/hr over 30 Minutes Intravenous Every 24 hours 03/02/18 0829 03/03/18 0959   02/28/18 1800  ertapenem (INVANZ) 500 mg in sodium chloride 0.9 % 50 mL IVPB  Status:  Discontinued     500 mg 100 mL/hr over 30 Minutes Intravenous Every 24 hours 02/28/18 1035 03/02/18 0829   02/27/18 1800  ertapenem (INVANZ) 1,000 mg in sodium chloride 0.9 % 100 mL IVPB  Status:  Discontinued     1 g 200 mL/hr over 30 Minutes Intravenous Every 24 hours 02/27/18 1705 02/28/18 1035   02/21/18 0800  ciprofloxacin (CIPRO) IVPB 400 mg  Status:  Discontinued     400 mg 200 mL/hr over 60 Minutes Intravenous Every 12 hours 02/20/18 2204 02/27/18 1908   02/21/18 0500  metroNIDAZOLE (FLAGYL) IVPB 500 mg  Status:  Discontinued     500 mg 100 mL/hr over 60 Minutes Intravenous Every 8 hours 02/20/18 2204 02/27/18 1908   02/20/18 2115  ciprofloxacin (CIPRO) IVPB 400 mg     400 mg 200 mL/hr over 60 Minutes Intravenous  Once 02/20/18 2113 02/20/18 2335   02/20/18  2115  metroNIDAZOLE (FLAGYL) tablet 500 mg     500 mg Oral  Once 02/20/18 2113 02/20/18 2147      Assessment/Plan: s/p Procedure(s): PARTIAL COLECTOMY WITH COLOSTOMY Impression: Continues to improve on postoperative day 6.  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  I did not see her ovaries, but her fallopian tubes were within normal limits.  She is status post a vaginal hysterectomy in the remote past.  Will advance her diet.  Leukocytosis slowly resolving.  LOS: 14 days    [name] 03/06/2018

## 2018-03-06 NOTE — Clinical Social Work Note (Signed)
Keri at [facility] was provided status update.     Ejay Lashley, Clydene Pugh, LCSW

## 2018-03-06 NOTE — Progress Notes (Signed)
PROGRESS NOTE  Regina Hall WPY:[id number] DOB: 03-04-1934 DOA: 02/20/2018 PCP: [name], DO   Brief History: 84 year old female with hypertension, history of left leg DVT status post IVC filter presented to the hospital with abdominal pain. CT of the abdomen pelvis shows diverticulitis with large amount of stool throughout the colon and concern for underlying colonic obstruction. Underwent sigmoidoscopy showing obstruction in the sigmoid colon with marked edema. GI and surgery following. The patient subsequently developed a new fever. Blood and urine cultures were ordered. Repeat CT abd/pelvis ordered 2/13showed free air. Case was discussed with general surgery and plans to go to OR 2/14. Pt underwent a Hartman's procedure on 02/28/18. She was transferred to ICU on ventilator post-op.  Post-op, she was hypotensive and started on neosynephrine. She also developed new onset atrial fibrillation. She was started on amiodarone IVon 02/28/18. She converted to sinus after ~5 hours. She was kept on amiodarone due to continued critical illness and high risk of reverting back to afib.  She developed fluid overload due to fluid resuscitation and was started on furosemide  Assessment/Plan: Large bowel (sigmoid) obstruction -Underwent sigmoidoscopy showing obstruction in the sigmoid colon with marked edema. Surgery following and recommend initially non-operative tx  -2/7--Underwent sigmoidoscopy showing obstruction in the sigmoid colon with marked edema. -GI recommends outpatient colonoscopy in 6 weeks for full evaluation of the colon. -Sigmoidoscopy biopsy--tubular adenoma -02/27/18 CT abd--New small amount of free intraperitoneal air, consistent with bowel perforation -case discussed with general surgery, Dr.Bridges and Dr. Arnoldo Morale. -2/14--Hartmann procedure -Positive biopsy for serous adenocarcinoma.  Will follow further recommendation by general surgery. -Continue  advancing diet.  Acute diverticulitiswith perforation/peritonitis -cipro/flagyl d/ced -ertapenem started 2/13 -02/27/18 CT abd--New small amount of free intraperitoneal air, consistent with bowel perforation -case discussed with general surgery, Dr.Bridges -2/14--Hartmann procedure -TPN/enteral feeding per general surgery; slowly advancing diet. -2/16--weaned off neosynephrine -continue ertapenem, linezolid and antifungal regimen. -2/18--WBC up today likely due to solumedrol--she remains off pressors and is afebrile and weaning nicely--steroids off since 2/18 -Continue to follow WBC's trend intermittently  New Onset Atrial Fibrillation -started amiodarone drip due to hypotension 02/28/18 -converted back to sinus after 4-5 hours on amio -continue amiodarone 200 mg bid by mouth, with intention to remain on amiodarone for 1 month and follow-up with cardiology service as an outpatient. -case discussed with Dr. Harl Bowie  Acute on chronic renal failure--CKD stage 3 -baseline creatinine 0.9-1.2 -nonoliguric -consult nephrology--appreciated recommendations and inputs. -due to infectious process and hemodynamic changes -remains fluid overloaded; continue lasix IV, start weaning -Follow-up renal function trend  Acute respiratory failure with hypoxia -due to hypoventilation, fluidoverload, bronchospasm -appreciate pulmonary service inputs -echo--EF 60-65%, mild LAE -Continue weaning oxygen supplementation -Start decreasing IV diuretics.  ??Hematemesis -RN reported multiple episodes of rust/red/brown emesis -consult GI--noted input -continuePPI bid -no further bleeding appreciated.  Bronchospasm/wheezing -continueas needed Xopenex/atrovent -continuepulmicort -2/16--repeat CXR--personally reviewed--mild improvement in interstitial markings, poor inspiration. -Repeat x-ray in a.m. -Steroids has been discontinued.  Chronic bilateral lower extremity edema -Suspect secondary  to venous insufficiency/hypoalbuminemia, fluid overload -Improved and stable. -Will continue decreasing IV Lasix; per renal recommendations 2 more days of 60 mg twice a day.  Essential hypertension -Continue holding losartan in the setting of hypotension and acute kidney injury. -Blood pressure stable. -Continue to monitor  Physical deconditioning -PT eval-->SNF -Reports ambulating with a walker due to scoliosis prior to hospitalization.    Disposition Plan:Patient has tolerated well transition of amiodarone drip.  Will transfer  to telemetry bed.  Continue IV diuresis.  Follow general surgery recommendations.  Family Communication:Sister and brother-in-law at bedside.  Consultants:General surgery, GI, renal  Code Status: FULL   DVT Prophylaxis: [place] Lovenox   Procedures: As Listed in Progress Note Above  Antibiotics: cipro2/7>>>2/13 Flagyl2/7>>>2/13 Ertapenem 2/13>>>> linezolid 2/17>>> Fluconazole 2/19>>>   Subjective: Afebrile, in no acute distress; good oxygen saturation on 2 L.  Still with signs of fluid overload appreciated on exam.  Reported good control in overall pain.  Objective: Vitals:   03/06/18 1300 03/06/18 1400 03/06/18 1413 03/06/18 1500  BP: (!) 112/49 (!) 109/54  (!) 109/54  Pulse: 69 72  69  Resp: (!) 27 (!) 25  (!) 27  Temp:      TempSrc:      SpO2: 96% 95% 96% 95%  Weight:      Height:        Intake/Output Summary (Last 24 hours) at 03/06/2018 1550 Last data filed at 03/06/2018 1500 Gross per 24 hour  Intake 1213.33 ml  Output 1250 ml  Net -36.67 ml   Weight change: 0.7 kg   Exam: General exam: Alert, awake, oriented x 3.  Patient is afebrile in no major distress.  Still requiring 2 L of nasal cannula supplementation and reporting some shortness of breath.  There is signs of fluid overload on exam and is otherwise not having any acute distress. Respiratory system: Positive rhonchi, no wheezing, no frank crackles on  exam.  Cardiovascular system: Rate controlled, currently sinus rhythm.  No rubs, no gallops.   Gastrointestinal system: Abdomen is nondistended, soft and nontender.  Colostomy bag in place. Central nervous system: Alert and oriented. No focal neurological deficits. Extremities: No cyanosis or clubbing.  Still with 1+ edema appreciated on exam. Skin: No rashes, no petechiae. Psychiatry: Judgement and insight appear normal. Mood & affect appropriate.    Data Reviewed: I have personally reviewed following labs and imaging studies  Basic Metabolic Panel: Recent Labs  Lab 02/28/18 0601 03/01/18 0419 03/02/18 0448 03/03/18 0445 03/04/18 0618 03/05/18 0404 03/06/18 0415  NA 133* 134* 135 139 140  141 139 139  K 4.5 4.3 4.9 4.0 3.3*  3.4* 3.3* 3.2*  CL 96* 105 108 110 106  106 101 102  CO2 24 21* 18* 22 23  24 28 28   GLUCOSE 100* 162* 147* 148* 126*  127* 210* 184*  BUN 31* 40* 47* 53* 54*  55* 56* 48*  CREATININE 2.23* 1.95* 1.73* 1.53* 1.35*  1.37* 1.39* 1.38*  CALCIUM 8.8* 7.7* 8.0* 8.3* 8.9  9.0 8.4* 8.0*  MG 2.5* 2.4  --   --   --   --   --   PHOS  --  4.0  --   --  3.6 4.1 3.1   Liver Function Tests: Recent Labs  Lab 03/03/18 0445 03/04/18 0618 03/05/18 0404 03/06/18 0415  AST 11* 11*  --   --   ALT 9 8  --   --   ALKPHOS 33* 33*  --   --   BILITOT 0.3 0.6  --   --   PROT 4.3* 5.6*  --   --   ALBUMIN 1.8* 3.3*  3.2* 2.4* 2.0*   CBC: Recent Labs  Lab 03/02/18 0448 03/03/18 0445 03/04/18 0618 03/05/18 0404 03/06/18 0415  WBC 18.3* 17.4* 20.3* 19.2* 17.0*  HGB 8.9* 8.4* 9.5* 8.6* 8.5*  HCT 27.3* 26.3* 29.6* 27.0* 26.7*  MCV 92.5 92.6 91.6 92.5 93.0  PLT 247 222  279 275 278   CBG: Recent Labs  Lab 03/05/18 1615 03/06/18 0131 03/06/18 0411 03/06/18 0804 03/06/18 1120  GLUCAP 207* 187* 170* 129* 152*   Urine analysis:    Component Value Date/Time   COLORURINE YELLOW 02/26/2018 [place] 02/26/2018 1712   LABSPEC 1.014  02/26/2018 1712   PHURINE 8.0 02/26/2018 1712   GLUCOSEU NEGATIVE 02/26/2018 1712   HGBUR SMALL (A) 02/26/2018 1712   BILIRUBINUR NEGATIVE 02/26/2018 1712   KETONESUR NEGATIVE 02/26/2018 1712   PROTEINUR NEGATIVE 02/26/2018 1712   UROBILINOGEN 0.2 04/21/2014 1200   NITRITE NEGATIVE 02/26/2018 [place] 02/26/2018 1712    Recent Results (from the past 240 hour(s))  Culture, blood (Routine X 2) w Reflex to ID Panel     Status: None   Collection Time: 02/26/18  9:25 AM  Result Value Ref Range Status   Specimen Description LEFT ANTECUBITAL  Final   Special Requests   Final    BOTTLES DRAWN AEROBIC AND ANAEROBIC Blood Culture adequate volume   Culture   Final    NO GROWTH 5 DAYS Performed at [facility], [address], [place], [place] [address]    Report Status 03/03/2018 FINAL  Final  Culture, blood (Routine X 2) w Reflex to ID Panel     Status: None   Collection Time: 02/26/18  9:33 AM  Result Value Ref Range Status   Specimen Description BLOOD LEFT HAND  Final   Special Requests   Final    BOTTLES DRAWN AEROBIC AND ANAEROBIC Blood Culture adequate volume   Culture   Final    NO GROWTH 5 DAYS Performed at [facility], [address], [place], [place] [address]    Report Status 03/03/2018 FINAL  Final  Culture, Urine     Status: None   Collection Time: 02/26/18  5:12 PM  Result Value Ref Range Status   Specimen Description   Final    URINE, RANDOM Performed at [facility], [address], [place], [place] [address]    Special Requests   Final    NONE Performed at [facility], [address], [place], [place] [address]    Culture   Final    NO GROWTH Performed at [facility], [place] [address] [place],  [address]    Report Status 02/28/2018 FINAL  Final  Respiratory Panel by PCR     Status: None   Collection Time: 02/27/18  8:07 AM  Result Value Ref Range Status   Adenovirus NOT DETECTED NOT DETECTED Final   Coronavirus 229E NOT  DETECTED NOT DETECTED Final    Comment: (NOTE) The Coronavirus on the Respiratory Panel, DOES NOT test for the novel  Coronavirus (2019 nCoV)    Coronavirus HKU1 NOT DETECTED NOT DETECTED Final   Coronavirus NL63 NOT DETECTED NOT DETECTED Final   Coronavirus OC43 NOT DETECTED NOT DETECTED Final   Metapneumovirus NOT DETECTED NOT DETECTED Final   Rhinovirus / Enterovirus NOT DETECTED NOT DETECTED Final   Influenza A NOT DETECTED NOT DETECTED Final   Influenza B NOT DETECTED NOT DETECTED Final   Parainfluenza Virus 1 NOT DETECTED NOT DETECTED Final   Parainfluenza Virus 2 NOT DETECTED NOT DETECTED Final   Parainfluenza Virus 3 NOT DETECTED NOT DETECTED Final   Parainfluenza Virus 4 NOT DETECTED NOT DETECTED Final   Respiratory Syncytial Virus NOT DETECTED NOT DETECTED Final   Bordetella pertussis NOT DETECTED NOT DETECTED Final   Chlamydophila pneumoniae NOT DETECTED  NOT DETECTED Final   Mycoplasma pneumoniae NOT DETECTED NOT DETECTED Final    Comment: Performed at [facility], [place] [address] [place], [place] [address]  Surgical PCR screen     Status: None   Collection Time: 02/28/18  7:52 AM  Result Value Ref Range Status   MRSA, PCR NEGATIVE NEGATIVE Final   Staphylococcus aureus NEGATIVE NEGATIVE Final    Comment: (NOTE) The Xpert SA Assay (FDA approved for NASAL specimens in patients 57 years of age and older), is one component of a comprehensive surveillance program. It is not intended to diagnose infection nor to guide or monitor treatment. Performed at [facility], [address] [place], [place] [address]   Aerobic/Anaerobic Culture (surgical/deep wound)     Status: None   Collection Time: 02/28/18  2:27 PM  Result Value Ref Range Status   Specimen Description   Final    WOUND Performed at [facility], [address] [place], [place] [address]    Special Requests   Final    ABSCESS Performed at [facility], [address] [place], [place] [address]    Gram  Stain   Final    RARE WBC PRESENT, PREDOMINANTLY PMN RARE GRAM POSITIVE COCCI RARE GRAM VARIABLE ROD RARE GRAM POSITIVE RODS    Culture   Final    FEW VANCOMYCIN RESISTANT ENTEROCOCCUS NO ANAEROBES ISOLATED Performed at [facility], [address], [place],  [address]    Report Status 03/05/2018 FINAL  Final   Organism ID, Bacteria VANCOMYCIN RESISTANT ENTEROCOCCUS  Final      Susceptibility   Vancomycin resistant enterococcus - MIC*    AMPICILLIN >=32 RESISTANT Resistant     VANCOMYCIN >=32 RESISTANT Resistant     GENTAMICIN SYNERGY SENSITIVE Sensitive     LINEZOLID 2 SENSITIVE Sensitive     * FEW VANCOMYCIN RESISTANT ENTEROCOCCUS     Scheduled Meds: . amiodarone  200 mg Oral BID  . budesonide (PULMICORT) nebulizer solution  0.5 mg Nebulization BID  . Chlorhexidine Gluconate Cloth  6 each Topical Daily  . enoxaparin (LOVENOX) injection  40 mg Subcutaneous Q24H  . feeding supplement (ENSURE ENLIVE)  237 mL Oral BID BM  . furosemide  60 mg Intravenous BID  . ipratropium  0.5 mg Nebulization Q6H  . levalbuterol  1.25 mg Nebulization Q6H  . pantoprazole  40 mg Oral Q1200  . potassium chloride  40 mEq Oral Daily  . sodium chloride flush  10-40 mL Intracatheter Q12H   Continuous Infusions: . sodium chloride Stopped (03/05/18 1323)  . sodium chloride    . ertapenem Stopped (03/05/18 1640)  . fluconazole (DIFLUCAN) IV Stopped (03/06/18 1309)  . linezolid (ZYVOX) IV 600 mg (03/06/18 0901)    Procedures/Studies: Ct Abdomen Pelvis Wo Contrast  Result Date: 02/27/2018 CLINICAL DATA:  Follow-up diverticulitis. Chronic abdominal pain. EXAM: CT ABDOMEN AND PELVIS WITHOUT CONTRAST TECHNIQUE: Multidetector CT imaging of the abdomen and pelvis was performed following the standard protocol without IV contrast. COMPARISON:  02/20/2018 FINDINGS: Lower chest: New small bilateral pleural effusions and bibasilar atelectasis. Hepatobiliary: Stable small cyst in left hepatic lobe.  No mass visualized on this unenhanced exam. Tiny gallstones again noted, however there is no evidence of cholecystitis or biliary ductal dilatation. Pancreas: No mass or inflammatory process visualized on this unenhanced exam. Spleen:  Within normal limits in size. Adrenals/Urinary tract: No evidence of urolithiasis or hydronephrosis. Unremarkable unopacified urinary bladder. Stomach/Bowel: New small amount of free intraperitoneal air is seen,  consistent with bowel perforation. Mild ascites is new since previous study, as well as diffuse mesenteric and body wall edema. Large amount of stool is again seen throughout the colon. A transition point is again seen near the rectosigmoid junction, consistent with stricture. No definite soft tissue mass appreciated by CT. Mild diverticulosis and wall thickening is seen involving the proximal sigmoid, which is similar to previous study and suspicious for mild diverticulitis. No evidence of small bowel wall thickening or obstruction. No abscess identified. Vascular/Lymphatic: No pathologically enlarged lymph nodes identified. No evidence of abdominal aortic aneurysm. Reproductive: Prior hysterectomy. Limited visualization due to artifact from left hip prosthesis. Other:  None. Musculoskeletal:  No suspicious bone lesions identified. IMPRESSION: 1. New small amount of free intraperitoneal air, consistent with bowel perforation. 2. Large colonic stool burden with transition point near the rectosigmoid junction, consistent with stricture. No definite soft tissue mass appreciated by CT. This is unchanged in appearance compared to previous study. Recommend correlation with colonoscopy to exclude obstructing colon carcinoma. 3. Mild diverticulitis of proximal sigmoid colon, without significant change. 4. New mild ascites and diffuse body wall edema. New small bilateral pleural effusions and bibasilar atelectasis. 5. Cholelithiasis. No radiographic evidence of cholecystitis. Critical  Value/emergent results were called by telephone at the time of interpretation on 02/27/2018 at 4:35 pm to Dr. Shanon Brow TAT , who verbally acknowledged these results. Electronically Signed   By: Earle Gell M.D.   On: 02/27/2018 16:38   Ct Abdomen Pelvis W Contrast  Result Date: 02/20/2018 CLINICAL DATA:  Lower abdominal pain and constipation for the past 2 days. Clinical suspicion for diverticulitis. EXAM: CT ABDOMEN AND PELVIS WITH CONTRAST TECHNIQUE: Multidetector CT imaging of the abdomen and pelvis was performed using the standard protocol following bolus administration of intravenous contrast. CONTRAST:  173mL OMNIPAQUE IOHEXOL 300 MG/ML  SOLN COMPARISON:  Abdomen radiographs dated 02/06/2018. Abdomen and pelvis CT dated 04/08/2015. FINDINGS: Lower chest: Unremarkable. Hepatobiliary: No significant change in previously demonstrated liver cysts. Small number of tiny gallstones in the gallbladder measuring up to 4 mm in maximum diameter each. No gallbladder wall thickening or pericholecystic fluid. Pancreas: Unremarkable. No pancreatic ductal dilatation or surrounding inflammatory changes. Spleen: 5 mm oval area of low density in the spleen on image number 29 series 2, difficult to detect with certainty on the previous examination, partly due to differences in technique and timing. Adrenals/Urinary Tract: Normal appearing adrenal glands. Stable tiny exophytic upper pole left renal cyst. Normal appearing right kidney, ureters and urinary bladder. Stomach/Bowel: Large amount of stool throughout the colon to the level of the distal sigmoid colon where there is a short segment of concentric, medium density wall thickening, best seen on image number 72 series 2. The wall thickening is concentric and lower in density on coronal image number 90. Lesser amount of stool and gas in the normal caliber rectum. Minimal sigmoid colon diverticulosis. Mild pericolonic soft tissue stranding involving the distal sigmoid colon, most  pronounced at the level of maximal distension of the colon by the stool. Normal appearing stomach, small bowel and appendix. Vascular/Lymphatic: Atheromatous arterial calcifications without aneurysm. No enlarged lymph nodes. Inferior vena cava filter with its tip just above the level of the renal veins. Reproductive: Status post hysterectomy. No adnexal masses. Other: Small amount of free peritoneal fluid in the pelvis. Musculoskeletal: Left hip prosthesis. Severe right hip degenerative changes with severe joint space narrowing, extensive subarticular cyst formation, spur formation and bony remodeling. Moderate levoconvex thoracolumbar scoliosis. Interbody and pedicle screw  and rod fusion at the L4-5 level. Fusion of portions of the T12 and L1 vertebral bodies. Lumbar and lower thoracic spine degenerative changes. IMPRESSION: 1. Large amount of stool throughout the colon to the level of the distal sigmoid colon where there is a short segment of concentric, medium density wall thickening. This is concerning for an obstructing short segment mass. It would be unusual for edema due to diverticulitis to involve that short of a segment of bowel. Correlation with sigmoidoscopy or colonoscopy is recommended. 2. Mild pericolonic soft tissue stranding involving the distal sigmoid colon, most pronounced at the level of maximal distension of the colon by the stool. This is suspicious for mild diverticulitis without abscess. There is minimal diverticulosis in that region of the colon. 3. Cholelithiasis. 4. 5 mm probable hemangioma in the spleen. Electronically Signed   By: Claudie Revering M.D.   On: 02/20/2018 20:59   Dg Chest Port 1 View  Result Date: 03/04/2018 CLINICAL DATA:  Pulmonary edema. EXAM: PORTABLE CHEST 1 VIEW COMPARISON:  03/03/2018 FINDINGS: 0538 hours. Endotracheal tube tip is 5.1 cm above the base of the carina. NG tube passes into the stomach. Right PICC line tip overlies the distal SVC level. Lung volumes are  low. The cardio pericardial silhouette is enlarged. There is pulmonary vascular congestion without overt pulmonary edema. Basilar atelectasis noted bilaterally with tiny bilateral pleural effusions. IMPRESSION: Low lung volumes with cardiomegaly, vascular congestion and tiny effusions. Electronically Signed   By: Misty Stanley M.D.   On: 03/04/2018 08:39   Dg Chest Port 1 View  Result Date: 03/03/2018 CLINICAL DATA:  Pulmonary edema. EXAM: PORTABLE CHEST 1 VIEW COMPARISON:  Radiograph of March 02, 2018. FINDINGS: Stable cardiomediastinal silhouette. Atherosclerosis of thoracic aorta is noted. Endotracheal and nasogastric tubes are unchanged in position. Right-sided PICC line is unchanged in position. Stable hypoinflation of the lungs is noted with associated bibasilar atelectasis and small pleural effusions. No pneumothorax is noted. Severe degenerative changes seen involving the left glenohumeral joint. IMPRESSION: Stable support apparatus. Stable hypoinflation of the lungs with mild bibasilar subsegmental atelectasis and small pleural effusions. Aortic Atherosclerosis (ICD10-I70.0). Electronically Signed   By: Marijo Conception, M.D.   On: 03/03/2018 08:27   Dg Chest Port 1 View  Result Date: 03/02/2018 CLINICAL DATA:  Hypoxia EXAM: PORTABLE CHEST 1 VIEW COMPARISON:  March 01, 2018 FINDINGS: Endotracheal tube tip is 3.7 cm above the carina. Nasogastric tube tip and side port are in the stomach. Right subclavian catheter tip is at the cavoatrial junction. No pneumothorax. There is cardiomegaly with pulmonary venous hypertension. There are small pleural effusions bilaterally. There is atelectatic change in the lung bases. A degree of consolidation in the left base is questioned. There is advanced arthropathy in the left shoulder. IMPRESSION: Tube and catheter positions as described without pneumothorax. Pulmonary vascular congestion present with small pleural effusions bilaterally. There is bibasilar  atelectasis with a questionable degree of consolidation in the left base. Aortic Atherosclerosis (ICD10-I70.0). Electronically Signed   By: Lowella Grip III M.D.   On: 03/02/2018 07:38   Dg Chest Port 1 View  Result Date: 03/01/2018 CLINICAL DATA:  Ventilator dependent respiratory failure. Follow-up pulmonary edema. EXAM: PORTABLE CHEST 1 VIEW COMPARISON:  02/28/2018, 02/28/2016 and earlier. FINDINGS: Endotracheal tube tip in satisfactory position projecting approximately 3-4 cm above the carina. RIGHT arm PICC tip projects at or near the cavoatrial junction, unchanged. Nasogastric tube looped in the stomach with its tip in the fundus. Markedly suboptimal inspiration with worsening  atelectasis in the lung bases, LEFT greater than RIGHT. Resolution of interstitial pulmonary edema. Persistent pulmonary venous hypertension. IMPRESSION: 1.  Support apparatus satisfactory. 2. Resolution of interstitial pulmonary edema since yesterday, though pulmonary venous hypertension persists. 3. Worsening bibasilar atelectasis, LEFT greater than RIGHT. Electronically Signed   By: Evangeline Dakin M.D.   On: 03/01/2018 09:01   Dg Chest Port 1 View  Result Date: 02/28/2018 CLINICAL DATA:  Intubation. EXAM: PORTABLE CHEST 1 VIEW COMPARISON:  02/26/2018 FINDINGS: The endotracheal tube is 3.3 cm above the carina. There is an NG tube coursing down the esophagus and into the stomach. The right-sided PICC line is stable. Significant worsening lung aeration with very low lung volumes, vascular crowding, pulmonary edema and basilar atelectasis. The heart remains enlarged and there is tortuosity and calcification of the thoracic aorta. IMPRESSION: 1. The endotracheal tube and NG tubes are in good position. 2. Very low lung volumes with vascular crowding and atelectasis 3. Vascular congestion and pulmonary edema. Electronically Signed   By: Marijo Sanes M.D.   On: 02/28/2018 16:37   Dg Chest Port 1 View  Result Date:  02/26/2018 CLINICAL DATA:  84 year old female with intermittent productive cough EXAM: PORTABLE CHEST 1 VIEW COMPARISON:  02/28/2016 FINDINGS: Cardiomediastinal silhouette unchanged. Interval development of asymmetric elevation the right hemidiaphragm. No pneumothorax. Low lung volumes with coarsened interstitial markings and interlobular septal thickening. No large pleural effusion. No confluent airspace disease. Interval placement of right upper extremity PICC with the tip appearing to terminate superior vena cava. Degenerative changes of the left glenohumeral joint. IMPRESSION: Low lung volumes with questionable edema. New asymmetric elevation the right hemidiaphragm, uncertain significance. Right upper extremity PICC. Electronically Signed   By: Corrie Mckusick D.O.   On: 02/26/2018 19:28   Dg Abd 2 Views  Result Date: 02/06/2018 CLINICAL DATA:  IVC filter placement evaluation. EXAM: ABDOMEN - 2 VIEW COMPARISON:  03/29/2017. FINDINGS: Lumbar spine numbered as per prior exam. IVC filter noted with tip at approximately the L1 level. Degenerative changes and scoliosis thoracic spine. L4-L5 fusion. Rounded calcific densities noted over the right upper quadrant, these could represent gallstones, kidney stones, undigested pill fragments. No bowel distention or free air. Stool noted throughout the colon and rectum. IMPRESSION: IVC filter noted with proximal tip at the L1 level. Electronically Signed   By: Marcello Moores  Register   On: 02/06/2018 14:21   [place] Ekg Site Rite  Result Date: 02/22/2018 If Site Rite image not attached, placement could not be confirmed due to current cardiac rhythm.  Time 30 minutes.   Barton Dubois, MD  [facility] Pager [phone] 03/06/2018, 3:50 PM   LOS: 14 days

## 2018-03-06 NOTE — NC FL2 (Signed)
[place] LEVEL OF CARE SCREENING TOOL     IDENTIFICATION  Patient Name: Regina Hall Birthdate: 03/23/1934 Sex: female Admission Date (Current Location): 02/20/2018  [facility] and [place] Number:  [employer] and Address:  [place] [address] [place]      Provider Number: [id number]  Attending Physician Name and Address:  Barton Dubois, MD  Relative Name and Phone Number:       Current Level of Care: Hospital Recommended Level of Care: [place] Prior Approval Number:    Date Approved/Denied:   PASRR Number:    Discharge Plan: SNF    Current Diagnoses: Patient Active Problem List   Diagnosis Date Noted  . Pulmonary edema   . Bronchospasm   . AKI (acute kidney injury) ([place])   . Diverticulitis of large intestine with perforation   . Bowel perforation ([place])   . Fecal peritonitis ([place])   . Fever 02/26/2018  . Abnormal CT scan, sigmoid colon   . Diverticulitis of large intestine without perforation or abscess without bleeding   . Large bowel obstruction ([place])   . Constipation   . Mass of colon 02/20/2018  . DDD (degenerative disc disease), lumbosacral 12/09/2017  . Impaired fasting glucose 12/09/2017  . DVT (deep venous thrombosis) ([place]) 06/04/2017  . Hematoma of right thigh 06/04/2017  . CKD (chronic kidney disease) stage 2, GFR 60-89 ml/min 06/04/2017  . Essential hypertension 06/04/2017  . Acute blood loss anemia 06/04/2017  . Chronic conjunctivitis 10/22/2016  . Chronic back pain 08/21/2016  . Scoliosis of lumbar spine 03/28/2015  . Primary osteoarthritis of right knee 04/27/2014  . Hip pain 02/17/2013  . History of total hip arthroplasty 09/09/2012    Orientation RESPIRATION BLADDER Height & Weight     Self, Time, Situation, Place  Normal Incontinent Weight: 242 lb 15.2 oz (110.2 kg) Height:  5\' 6"  (167.6 cm)  BEHAVIORAL SYMPTOMS/MOOD NEUROLOGICAL BOWEL NUTRITION STATUS   Colostomy Diet(soft)  AMBULATORY STATUS COMMUNICATION OF NEEDS Skin   Extensive Assist Verbally Surgical wounds(abdomen)                       Personal Care Assistance Level of Assistance  Bathing, Feeding, Dressing Bathing Assistance: Limited assistance Feeding assistance: Independent Dressing Assistance: Limited assistance     Functional Limitations Info  Sight, Hearing, Speech Sight Info: Adequate Hearing Info: Adequate Speech Info: Adequate    SPECIAL CARE FACTORS FREQUENCY  PT (By licensed PT)     PT Frequency: 5x/week              Contractures Contractures Info: Not present    Additional Factors Info  Code Status, Allergies Code Status Info: Full code Allergies Info: Penicillins, Vancomycin Psychotropic Info: n/a         Current Medications (03/06/2018):  This is the current hospital active medication list Current Facility-Administered Medications  Medication Dose Route Frequency Provider Last Rate Last Dose  . 0.9 %  sodium chloride infusion   Intravenous Continuous Sinda Du, MD   Stopped at 03/05/18 1323  . 0.9 %  sodium chloride infusion   Intra-arterial PRN [name], MD      . acetaminophen (TYLENOL) tablet 650 mg  650 mg Oral Q6H PRN Oswald Hillock, MD       Or  . acetaminophen (TYLENOL) suppository 650 mg  650 mg Rectal Q6H PRN Oswald Hillock, MD      . amiodarone (PACERONE)  tablet 200 mg  200 mg Oral BID Barton Dubois, MD   200 mg at 03/06/18 0901  . budesonide (PULMICORT) nebulizer solution 0.5 mg  0.5 mg Nebulization BID Tat, David, MD   0.5 mg at 03/06/18 0843  . Chlorhexidine Gluconate Cloth 2 % PADS 6 each  6 each Topical Daily [name], MD   6 each at 03/06/18 [phone]  . enoxaparin (LOVENOX) injection 40 mg  40 mg Subcutaneous Q24H Tat, Shanon Brow, MD   40 mg at 03/06/18 0805  . ertapenem (INVANZ) 1,000 mg in sodium chloride 0.9 % 100 mL IVPB  1 g Intravenous Q24H Orson Eva, MD   Stopped at 03/05/18 1640  . feeding supplement  (ENSURE ENLIVE) (ENSURE ENLIVE) liquid 237 mL  237 mL Oral BID BM Barton Dubois, MD   237 mL at 03/06/18 0901  . fluconazole (DIFLUCAN) IVPB 200 mg  200 mg Intravenous Q24H Barton Dubois, MD 100 mL/hr at 03/06/18 1209 200 mg at 03/06/18 1209  . furosemide (LASIX) injection 20 mg  20 mg Intravenous BID Barton Dubois, MD   20 mg at 03/06/18 0559  . hydrALAZINE (APRESOLINE) injection 5 mg  5 mg Intravenous Q6H PRN Tat, David, MD      . HYDROmorphone (DILAUDID) injection 1 mg  1 mg Intravenous Q4H PRN Oswald Hillock, MD   1 mg at 03/05/18 2313  . iohexol (OMNIPAQUE) 300 MG/ML solution 100 mL  100 mL Intravenous Once PRN Tat, Shanon Brow, MD      . iopamidol (ISOVUE-300) 61 % injection 30 mL  30 mL Oral Once PRN Tat, Shanon Brow, MD      . ipratropium (ATROVENT) nebulizer solution 0.5 mg  0.5 mg Nebulization Q6H Orson Eva, MD   0.5 mg at 03/06/18 0834  . levalbuterol (XOPENEX) nebulizer solution 0.63 mg  0.63 mg Nebulization Q8H PRN Opyd, Ilene Qua, MD   0.63 mg at 02/28/18 0352  . levalbuterol (XOPENEX) nebulizer solution 1.25 mg  1.25 mg Nebulization Q6H Orson Eva, MD   1.25 mg at 03/06/18 0834  . linezolid (ZYVOX) IVPB 600 mg  600 mg Intravenous Therisa Doyne, MD 300 mL/hr at 03/06/18 0901 600 mg at 03/06/18 0901  . LORazepam (ATIVAN) injection 1 mg  1 mg Intravenous Q4H PRN [name], MD   1 mg at 03/05/18 2312  . ondansetron (ZOFRAN-ODT) disintegrating tablet 4 mg  4 mg Oral Q6H PRN [name], MD       Or  . ondansetron [facility]) injection 4 mg  4 mg Intravenous Q6H PRN [name], MD      . oxyCODONE-acetaminophen (PERCOCET/ROXICET) 5-325 MG per tablet 1 tablet  1 tablet Oral Q6H PRN Kathie Dike, MD   1 tablet at 02/27/18 1030  . pantoprazole (PROTONIX) EC tablet 40 mg  40 mg Oral Q1200 [name], MD   40 mg at 03/06/18 1122  . phenol (CHLORASEPTIC) mouth spray 1 spray  1 spray Mouth/Throat PRN Oswald Hillock, MD   1 spray at 02/23/18 0325  . sodium chloride flush (NS) 0.9 % injection  10-40 mL  10-40 mL Intracatheter PRN Kathie Dike, MD      . sodium chloride flush (NS) 0.9 % injection 10-40 mL  10-40 mL Intracatheter Q12H [name], MD   10 mL at 03/06/18 0901  . sodium chloride flush (NS) 0.9 % injection 10-40 mL  10-40 mL Intracatheter PRN [name], MD       Facility-Administered Medications Ordered in Other Encounters  Medication Dose Route  Frequency Provider Last Rate Last Dose  . ondansetron (ZOFRAN) 4 mg in sodium chloride 0.9 % 50 mL IVPB  4 mg Intravenous Once Rod Can, MD         Discharge Medications: Please see discharge summary for a list of discharge medications.  Relevant Imaging Results:  Relevant Lab Results:   Additional Information SSN: [id number]  Holmes Hays, Clydene Pugh, LCSW

## 2018-03-06 NOTE — Progress Notes (Addendum)
[facility] Progress Note    Assessment/ Plan:    50F presented 2/6 with abd pain and constipation found to have colonic obstruction complicated by perforation.  Had ex-lap 2/14 and was found to have fecal contamination.  S/p diverting ostomy and had acute hypoxic respiratory failure, volume overload.  Was extubated 2/18 and is still undergoing aggressive diuresis.     1.  Colonic perforation s/p diverting ostomy: 2/14 with surgery.  On ertapenem, fluconazole, linezolid.  OR cultures grew VRE.  Pathology showing omental serous carcinoma.  She's s/p hysterectomy-- don't know if she had salpingoophrectomy as well.  Per surg and primary  2.  Acute hypoxic RF:  Extubated yesterday, now on [place].  Doing well.  3.  Volume overload: excellent response to Lasix- would recommend continuing aggressive diuresis with at least 80 IV BID and K supplementation.  Need to get vol status better so everything will heal.  Her albumin is only 2 at present so I don't expect all her anasarca to resolve at this point but we still do have room for improvement.  4.  Afib: on amiodarone  5.  H/o DVT: has IVC filter  6.  Dispo: remains in [place].  Creatinine has remained stable with diuresis, recommend continuing aggressive diuresis for at least 1-2 more days.  Nothing else to add from renal standpoint, will sign off.    Subjective:    Sleeping, easily arousable.  Urine output continues- not charted but Purewick canister is completely full.     Objective:   BP (!) 107/52   Pulse 72   Temp 97.7 F (36.5 C) (Oral)   Resp (!) 24   Ht 5\' 6"  (1.676 m)   Wt 110.2 kg   SpO2 96%   BMI 39.21 kg/m   Intake/Output Summary (Last 24 hours) at 03/06/2018 1610 Last data filed at 03/06/2018 0600 Gross per 24 hour  Intake 240 ml  Output 450 ml  Net -210 ml   Weight change: 0.7 kg  Physical Exam: Gen: older woman, NAD CVS: RRR Resp: clear anteriorly, diminished posteriorly Abd: distended, + abd wall  edema, incision with staples, boggy tissue, some serous discharge, + ostomy pink Ext: 3+ anasarca  Imaging: No results found.  Labs: BMET Recent Labs  Lab 02/28/18 0601 03/01/18 0419 03/02/18 0448 03/03/18 0445 03/04/18 0618 03/05/18 0404 03/06/18 0415  NA 133* 134* 135 139 140  141 139 139  K 4.5 4.3 4.9 4.0 3.3*  3.4* 3.3* 3.2*  CL 96* 105 108 110 106  106 101 102  CO2 24 21* 18* 22 23  24 28 28   GLUCOSE 100* 162* 147* 148* 126*  127* 210* 184*  BUN 31* 40* 47* 53* 54*  55* 56* 48*  CREATININE 2.23* 1.95* 1.73* 1.53* 1.35*  1.37* 1.39* 1.38*  CALCIUM 8.8* 7.7* 8.0* 8.3* 8.9  9.0 8.4* 8.0*  PHOS  --  4.0  --   --  3.6 4.1 3.1   CBC Recent Labs  Lab 03/03/18 0445 03/04/18 0618 03/05/18 0404 03/06/18 0415  WBC 17.4* 20.3* 19.2* 17.0*  HGB 8.4* 9.5* 8.6* 8.5*  HCT 26.3* 29.6* 27.0* 26.7*  MCV 92.6 91.6 92.5 93.0  PLT 222 279 275 278    Medications:    . amiodarone  200 mg Oral BID  . budesonide (PULMICORT) nebulizer solution  0.5 mg Nebulization BID  . Chlorhexidine Gluconate Cloth  6 each Topical Daily  . enoxaparin (LOVENOX) injection  40 mg Subcutaneous Q24H  . feeding  supplement (ENSURE ENLIVE)  237 mL Oral BID BM  . furosemide  20 mg Intravenous BID  . ipratropium  0.5 mg Nebulization Q6H  . levalbuterol  1.25 mg Nebulization Q6H  . pantoprazole  40 mg Oral Q1200  . sodium chloride flush  10-40 mL Intracatheter Q12H      Madelon Lips MD [facility] pgr [phone] 03/06/2018, 9:37 AM

## 2018-03-07 LAB — GLUCOSE, CAPILLARY
GLUCOSE-CAPILLARY: 117 mg/dL — AB (ref 70–99)
Glucose-Capillary: 115 mg/dL — ABNORMAL HIGH (ref 70–99)
Glucose-Capillary: 139 mg/dL — ABNORMAL HIGH (ref 70–99)
Glucose-Capillary: 140 mg/dL — ABNORMAL HIGH (ref 70–99)
Glucose-Capillary: 160 mg/dL — ABNORMAL HIGH (ref 70–99)
Glucose-Capillary: 182 mg/dL — ABNORMAL HIGH (ref 70–99)
Glucose-Capillary: 209 mg/dL — ABNORMAL HIGH (ref 70–99)

## 2018-03-07 LAB — RENAL FUNCTION PANEL
Albumin: 2 g/dL — ABNORMAL LOW (ref 3.5–5.0)
Anion gap: 7 (ref 5–15)
BUN: 43 mg/dL — ABNORMAL HIGH (ref 8–23)
CHLORIDE: 103 mmol/L (ref 98–111)
CO2: 31 mmol/L (ref 22–32)
Calcium: 8.2 mg/dL — ABNORMAL LOW (ref 8.9–10.3)
Creatinine, Ser: 1.31 mg/dL — ABNORMAL HIGH (ref 0.44–1.00)
GFR calc Af Amer: 44 mL/min — ABNORMAL LOW (ref >60)
GFR calc non Af Amer: 38 mL/min — ABNORMAL LOW (ref >60)
Glucose, Bld: 120 mg/dL — ABNORMAL HIGH (ref 70–99)
Phosphorus: 3.3 mg/dL (ref 2.5–4.6)
Potassium: 3.5 mmol/L (ref 3.5–5.1)
Sodium: 141 mmol/L (ref 135–145)

## 2018-03-07 LAB — CBC
HCT: 29.1 % — ABNORMAL LOW (ref 36.0–46.0)
Hemoglobin: 9 g/dL — ABNORMAL LOW (ref 12.0–15.0)
MCH: 29.3 pg (ref 26.0–34.0)
MCHC: 30.9 g/dL (ref 30.0–36.0)
MCV: 94.8 fL (ref 80.0–100.0)
Platelets: 297 10*3/uL (ref 150–400)
RBC: 3.07 MIL/uL — AB (ref 3.87–5.11)
RDW: 15.2 % (ref 11.5–15.5)
WBC: 15.7 10*3/uL — ABNORMAL HIGH (ref 4.0–10.5)
nRBC: 0 % (ref 0.0–0.2)

## 2018-03-07 LAB — MAGNESIUM: Magnesium: 1.8 mg/dL (ref 1.7–2.4)

## 2018-03-07 NOTE — Progress Notes (Signed)
PROGRESS NOTE  Regina Hall KDX:[id number] DOB: 1934/02/26 DOA: 02/20/2018 PCP: [name], DO   Brief History: 84 year old female with hypertension, history of left leg DVT status post IVC filter presented to the hospital with abdominal pain. CT of the abdomen pelvis shows diverticulitis with large amount of stool throughout the colon and concern for underlying colonic obstruction. Underwent sigmoidoscopy showing obstruction in the sigmoid colon with marked edema. GI and surgery following. The patient subsequently developed a new fever. Blood and urine cultures were ordered. Repeat CT abd/pelvis ordered 2/13showed free air. Case was discussed with general surgery and plans to go to OR 2/14. Pt underwent a Hartman's procedure on 02/28/18. She was transferred to ICU on ventilator post-op.  Post-op, she was hypotensive and started on neosynephrine. She also developed new onset atrial fibrillation. She was started on amiodarone IVon 02/28/18. She converted to sinus after ~5 hours. She was kept on amiodarone due to continued critical illness and high risk of reverting back to afib.  She developed fluid overload due to fluid resuscitation and was started on furosemide  Assessment/Plan: Large bowel (sigmoid) obstruction -Underwent sigmoidoscopy showing obstruction in the sigmoid colon with marked edema. Surgery following and recommend initially non-operative tx  -2/7--Underwent sigmoidoscopy showing obstruction in the sigmoid colon with marked edema. -GI recommends outpatient colonoscopy in 6 weeks for full evaluation of the colon. -Sigmoidoscopy biopsy--tubular adenoma -02/27/18 CT abd--New small amount of free intraperitoneal air, consistent with bowel perforation -case discussed with general surgery, Dr.Bridges and Dr. Arnoldo Morale. -2/14--Hartmann procedure -Positive biopsy for serous adenocarcinoma.  Will follow further recommendation by general surgery. -Continue  advancing diet.  Acute diverticulitiswith perforation/peritonitis -cipro/flagyl d/ced -ertapenem started 2/13 -02/27/18 CT abd--New small amount of free intraperitoneal air, consistent with bowel perforation -case discussed with general surgery, Dr.Bridges -2/14--Hartmann procedure -TPN/enteral feeding per general surgery; slowly advancing diet. -2/16--weaned off neosynephrine -continue ertapenem, linezolid and antifungal regimen. -2/18--WBC up today likely due to solumedrol--she remains off pressors and is afebrile and weaning nicely--steroids off since 2/18 -Continue to follow WBC's trend intermittently  New Onset Atrial Fibrillation -started amiodarone drip due to hypotension 02/28/18 -converted back to sinus after 4-5 hours on amio -continue amiodarone 200 mg bid by mouth, with intention to remain on amiodarone for 1 month and follow-up with cardiology service as an outpatient. -case discussed with Dr. Harl Bowie  Acute on chronic renal failure--CKD stage 3 -baseline creatinine 0.9-1.2 -nonoliguric -consult nephrology--appreciated recommendations and inputs. -due to infectious process and hemodynamic changes -remains fluid overloaded; continue lasix IV, start weaning -Follow-up renal function trend  Acute respiratory failure with hypoxia -due to hypoventilation, fluidoverload, bronchospasm -appreciate pulmonary service inputs -echo--EF 60-65%, mild LAE -Continue weaning oxygen supplementation -Start decreasing IV diuretics.  ??Hematemesis -RN reported multiple episodes of rust/red/brown emesis -consult GI--noted input -continuePPI bid -no further bleeding appreciated.  Bronchospasm/wheezing -continueas needed Xopenex/atrovent -continuepulmicort -2/16--repeat CXR--personally reviewed--mild improvement in interstitial markings, poor inspiration. -Repeat x-ray in a.m. -Steroids has been discontinued.  Chronic bilateral lower extremity edema -Suspect secondary  to venous insufficiency/hypoalbuminemia, fluid overload -Improved and stable. -Will continue decreasing IV Lasix; per renal recommendations 1 more day of 60 mg twice a day.  Essential hypertension -Continue holding losartan in the setting of hypotension and acute kidney injury. -Blood pressure stable. -Continue to monitor  Physical deconditioning -PT eval-->SNF -Reports ambulating with a walker due to scoliosis prior to hospitalization.    Disposition Plan:Patient has tolerated well transition of amiodarone drip.  Continue IV  diuresis.  Follow general surgery recommendations.  Afebrile and WBCs trending down.  Family Communication:Sister and brother-in-law at bedside.  Consultants:General surgery, GI, renal  Code Status: FULL   DVT Prophylaxis: [place] Lovenox   Procedures: As Listed in Progress Note Above  Antibiotics: cipro2/7>>>2/13 Flagyl2/7>>>2/13 Ertapenem 2/13>>>> linezolid 2/17>>> Fluconazole 2/19>>>   Subjective: Afebrile, in no major distress.  Reporting sore throat and diffuse weakness/deconditioning.  Patient is still with signs of fluid overload and requiring 1.5 L nasal cannula supplementation.  Overall pain is well controlled and is tolerating soft diet.  Objective: Vitals:   03/07/18 0738 03/07/18 0743 03/07/18 1348 03/07/18 1401  BP:   (!) 143/63   Pulse:   66   Resp:   18   Temp:   98.3 F (36.8 C)   TempSrc:   Oral   SpO2: 95% 95% 97% 96%  Weight:      Height:        Intake/Output Summary (Last 24 hours) at 03/07/2018 1719 Last data filed at 03/07/2018 1700 Gross per 24 hour  Intake 460 ml  Output 2900 ml  Net -2440 ml   Weight change: -0.3 kg   Exam: General exam: Alert, awake, oriented x 3; complaining of sore throat and is still requiring 1.5 L nasal cannula supplementation.  Patient is afebrile, having able to tolerate soft diet and reports good controlled pain in her abdomen.  Still with some signs of fluid overload  on exam; but much improved. Respiratory system: No wheezing, no frank crackles.  Normal respiratory effort.  Positive rhonchi's. Cardiovascular system:Rate controlled. No murmurs, rubs, gallops. Gastrointestinal system: Abdomen is nondistended, soft and nontender.  Colostomy bag in place.  Abdominal dressing from recent surgery in place. Central nervous system: Alert and oriented. No focal neurological deficits. Extremities: No cyanosis or clubbing.  Trace-1+ edema bilaterally appreciated upper and lower extremities. Skin: No rashes, no petechiae. Psychiatry: Judgement and insight appear normal. Mood & affect appropriate.   Data Reviewed: I have personally reviewed following labs and imaging studies  Basic Metabolic Panel: Recent Labs  Lab 03/01/18 0419  03/03/18 0445 03/04/18 0618 03/05/18 0404 03/06/18 0415 03/07/18 0526  NA 134*   < > 139 140  141 139 139 141  K 4.3   < > 4.0 3.3*  3.4* 3.3* 3.2* 3.5  CL 105   < > 110 106  106 101 102 103  CO2 21*   < > 22 23  24 28 28 31   GLUCOSE 162*   < > 148* 126*  127* 210* 184* 120*  BUN 40*   < > 53* 54*  55* 56* 48* 43*  CREATININE 1.95*   < > 1.53* 1.35*  1.37* 1.39* 1.38* 1.31*  CALCIUM 7.7*   < > 8.3* 8.9  9.0 8.4* 8.0* 8.2*  MG 2.4  --   --   --   --   --  1.8  PHOS 4.0  --   --  3.6 4.1 3.1 3.3   < > = values in this interval not displayed.   Liver Function Tests: Recent Labs  Lab 03/03/18 0445 03/04/18 0618 03/05/18 0404 03/06/18 0415 03/07/18 0526  AST 11* 11*  --   --   --   ALT 9 8  --   --   --   ALKPHOS 33* 33*  --   --   --   BILITOT 0.3 0.6  --   --   --   PROT 4.3* 5.6*  --   --   --  ALBUMIN 1.8* 3.3*  3.2* 2.4* 2.0* 2.0*   CBC: Recent Labs  Lab 03/03/18 0445 03/04/18 0618 03/05/18 0404 03/06/18 0415 03/07/18 0526  WBC 17.4* 20.3* 19.2* 17.0* 15.7*  HGB 8.4* 9.5* 8.6* 8.5* 9.0*  HCT 26.3* 29.6* 27.0* 26.7* 29.1*  MCV 92.6 91.6 92.5 93.0 94.8  PLT 222 279 275 278 297   CBG: Recent Labs    Lab 03/07/18 0048 03/07/18 0406 03/07/18 0729 03/07/18 1156 03/07/18 1633  GLUCAP 140* 117* 115* 209* 182*   Urine analysis:    Component Value Date/Time   COLORURINE YELLOW 02/26/2018 [place] 02/26/2018 1712   LABSPEC 1.014 02/26/2018 1712   PHURINE 8.0 02/26/2018 1712   GLUCOSEU NEGATIVE 02/26/2018 1712   HGBUR SMALL (A) 02/26/2018 1712   BILIRUBINUR NEGATIVE 02/26/2018 1712   KETONESUR NEGATIVE 02/26/2018 1712   PROTEINUR NEGATIVE 02/26/2018 1712   UROBILINOGEN 0.2 04/21/2014 1200   NITRITE NEGATIVE 02/26/2018 1712   LEUKOCYTESUR NEGATIVE 02/26/2018 1712    Recent Results (from the past 240 hour(s))  Culture, blood (Routine X 2) w Reflex to ID Panel     Status: None   Collection Time: 02/26/18  9:25 AM  Result Value Ref Range Status   Specimen Description LEFT ANTECUBITAL  Final   Special Requests   Final    BOTTLES DRAWN AEROBIC AND ANAEROBIC Blood Culture adequate volume   Culture   Final    NO GROWTH 5 DAYS Performed at [facility], [address], [place], [place] [address]    Report Status 03/03/2018 FINAL  Final  Culture, blood (Routine X 2) w Reflex to ID Panel     Status: None   Collection Time: 02/26/18  9:33 AM  Result Value Ref Range Status   Specimen Description BLOOD LEFT HAND  Final   Special Requests   Final    BOTTLES DRAWN AEROBIC AND ANAEROBIC Blood Culture adequate volume   Culture   Final    NO GROWTH 5 DAYS Performed at [facility], [address], [place], [place] [address]    Report Status 03/03/2018 FINAL  Final  Culture, Urine     Status: None   Collection Time: 02/26/18  5:12 PM  Result Value Ref Range Status   Specimen Description   Final    URINE, RANDOM Performed at [facility], [address] [place], [place] [address]    Special Requests   Final    NONE Performed at [facility], [address] [place], [place] [address]    Culture   Final    NO GROWTH Performed at [facility], [place] [address], [place], [place] [address]    Report Status 02/28/2018 FINAL  Final  Respiratory Panel by PCR     Status: None   Collection Time: 02/27/18  8:07 AM  Result Value Ref Range Status   Adenovirus NOT DETECTED NOT DETECTED Final   Coronavirus 229E NOT DETECTED NOT DETECTED Final    Comment: (NOTE) The Coronavirus on the Respiratory Panel, DOES NOT test for the novel  Coronavirus (2019 nCoV)    Coronavirus HKU1 NOT DETECTED NOT DETECTED Final   Coronavirus NL63 NOT DETECTED NOT DETECTED Final   Coronavirus OC43 NOT DETECTED NOT DETECTED Final   Metapneumovirus NOT DETECTED NOT DETECTED Final   Rhinovirus / Enterovirus NOT DETECTED NOT DETECTED Final   Influenza A NOT DETECTED NOT DETECTED Final   Influenza B NOT DETECTED NOT DETECTED Final   Parainfluenza Virus 1 NOT DETECTED  NOT DETECTED Final   Parainfluenza Virus 2 NOT DETECTED NOT DETECTED Final   Parainfluenza Virus 3 NOT DETECTED NOT DETECTED Final   Parainfluenza Virus 4 NOT DETECTED NOT DETECTED Final   Respiratory Syncytial Virus NOT DETECTED NOT DETECTED Final   Bordetella pertussis NOT DETECTED NOT DETECTED Final   Chlamydophila pneumoniae NOT DETECTED NOT DETECTED Final   Mycoplasma pneumoniae NOT DETECTED NOT DETECTED Final    Comment: Performed at [facility], [place] [address], [place], [place] [address]  Surgical PCR screen     Status: None   Collection Time: 02/28/18  7:52 AM  Result Value Ref Range Status   MRSA, PCR NEGATIVE NEGATIVE Final   Staphylococcus aureus NEGATIVE NEGATIVE Final    Comment: (NOTE) The Xpert SA Assay (FDA approved for NASAL specimens in patients 61 years of age and older), is one component of a comprehensive surveillance program. It is not intended to diagnose infection nor to guide or monitor treatment. Performed at [facility], [address] [place], [place] [address]   Aerobic/Anaerobic Culture (surgical/deep wound)     Status: None   Collection Time: 02/28/18  2:27 PM  Result  Value Ref Range Status   Specimen Description   Final    WOUND Performed at [facility], [address], [place], [place] [address]    Special Requests   Final    ABSCESS Performed at [facility], [address], [place], [place] [address]    Gram Stain   Final    RARE WBC PRESENT, PREDOMINANTLY PMN RARE GRAM POSITIVE COCCI RARE GRAM VARIABLE ROD RARE GRAM POSITIVE RODS    Culture   Final    FEW VANCOMYCIN RESISTANT ENTEROCOCCUS NO ANAEROBES ISOLATED Performed at [facility], [address] [place],  [address]    Report Status 03/05/2018 FINAL  Final   Organism ID, Bacteria VANCOMYCIN RESISTANT ENTEROCOCCUS  Final      Susceptibility   Vancomycin resistant enterococcus - MIC*    AMPICILLIN >=32 RESISTANT Resistant     VANCOMYCIN >=32 RESISTANT Resistant     GENTAMICIN SYNERGY SENSITIVE Sensitive     LINEZOLID 2 SENSITIVE Sensitive     * FEW VANCOMYCIN RESISTANT ENTEROCOCCUS     Scheduled Meds: . amiodarone  200 mg Oral BID  . budesonide (PULMICORT) nebulizer solution  0.5 mg Nebulization BID  . Chlorhexidine Gluconate Cloth  6 each Topical Daily  . enoxaparin (LOVENOX) injection  40 mg Subcutaneous Q24H  . feeding supplement (ENSURE ENLIVE)  237 mL Oral BID BM  . furosemide  60 mg Intravenous BID  . ipratropium  0.5 mg Nebulization TID  . levalbuterol  1.25 mg Nebulization TID  . pantoprazole  40 mg Oral Q1200  . potassium chloride  40 mEq Oral Daily  . sodium chloride flush  10-40 mL Intracatheter Q12H   Continuous Infusions: . sodium chloride Stopped (03/05/18 1323)  . ertapenem Stopped (03/06/18 1657)  . fluconazole (DIFLUCAN) IV 200 mg (03/07/18 1414)  . linezolid (ZYVOX) IV 600 mg (03/07/18 1107)    Procedures/Studies: Ct Abdomen Pelvis Wo Contrast  Result Date: 02/27/2018 CLINICAL DATA:  Follow-up diverticulitis. Chronic abdominal pain. EXAM: CT ABDOMEN AND PELVIS WITHOUT CONTRAST TECHNIQUE: Multidetector CT imaging of the abdomen and pelvis  was performed following the standard protocol without IV contrast. COMPARISON:  02/20/2018 FINDINGS: Lower chest: New small bilateral pleural effusions and bibasilar atelectasis. Hepatobiliary: Stable small cyst in left hepatic lobe. No mass visualized on this unenhanced exam. Tiny gallstones  again noted, however there is no evidence of cholecystitis or biliary ductal dilatation. Pancreas: No mass or inflammatory process visualized on this unenhanced exam. Spleen:  Within normal limits in size. Adrenals/Urinary tract: No evidence of urolithiasis or hydronephrosis. Unremarkable unopacified urinary bladder. Stomach/Bowel: New small amount of free intraperitoneal air is seen, consistent with bowel perforation. Mild ascites is new since previous study, as well as diffuse mesenteric and body wall edema. Large amount of stool is again seen throughout the colon. A transition point is again seen near the rectosigmoid junction, consistent with stricture. No definite soft tissue mass appreciated by CT. Mild diverticulosis and wall thickening is seen involving the proximal sigmoid, which is similar to previous study and suspicious for mild diverticulitis. No evidence of small bowel wall thickening or obstruction. No abscess identified. Vascular/Lymphatic: No pathologically enlarged lymph nodes identified. No evidence of abdominal aortic aneurysm. Reproductive: Prior hysterectomy. Limited visualization due to artifact from left hip prosthesis. Other:  None. Musculoskeletal:  No suspicious bone lesions identified. IMPRESSION: 1. New small amount of free intraperitoneal air, consistent with bowel perforation. 2. Large colonic stool burden with transition point near the rectosigmoid junction, consistent with stricture. No definite soft tissue mass appreciated by CT. This is unchanged in appearance compared to previous study. Recommend correlation with colonoscopy to exclude obstructing colon carcinoma. 3. Mild diverticulitis of  proximal sigmoid colon, without significant change. 4. New mild ascites and diffuse body wall edema. New small bilateral pleural effusions and bibasilar atelectasis. 5. Cholelithiasis. No radiographic evidence of cholecystitis. Critical Value/emergent results were called by telephone at the time of interpretation on 02/27/2018 at 4:35 pm to Dr. Shanon Brow TAT , who verbally acknowledged these results. Electronically Signed   By: Earle Gell M.D.   On: 02/27/2018 16:38   Ct Abdomen Pelvis W Contrast  Result Date: 02/20/2018 CLINICAL DATA:  Lower abdominal pain and constipation for the past 2 days. Clinical suspicion for diverticulitis. EXAM: CT ABDOMEN AND PELVIS WITH CONTRAST TECHNIQUE: Multidetector CT imaging of the abdomen and pelvis was performed using the standard protocol following bolus administration of intravenous contrast. CONTRAST:  179mL OMNIPAQUE IOHEXOL 300 MG/ML  SOLN COMPARISON:  Abdomen radiographs dated 02/06/2018. Abdomen and pelvis CT dated 04/08/2015. FINDINGS: Lower chest: Unremarkable. Hepatobiliary: No significant change in previously demonstrated liver cysts. Small number of tiny gallstones in the gallbladder measuring up to 4 mm in maximum diameter each. No gallbladder wall thickening or pericholecystic fluid. Pancreas: Unremarkable. No pancreatic ductal dilatation or surrounding inflammatory changes. Spleen: 5 mm oval area of low density in the spleen on image number 29 series 2, difficult to detect with certainty on the previous examination, partly due to differences in technique and timing. Adrenals/Urinary Tract: Normal appearing adrenal glands. Stable tiny exophytic upper pole left renal cyst. Normal appearing right kidney, ureters and urinary bladder. Stomach/Bowel: Large amount of stool throughout the colon to the level of the distal sigmoid colon where there is a short segment of concentric, medium density wall thickening, best seen on image number 72 series 2. The wall thickening is  concentric and lower in density on coronal image number 90. Lesser amount of stool and gas in the normal caliber rectum. Minimal sigmoid colon diverticulosis. Mild pericolonic soft tissue stranding involving the distal sigmoid colon, most pronounced at the level of maximal distension of the colon by the stool. Normal appearing stomach, small bowel and appendix. Vascular/Lymphatic: Atheromatous arterial calcifications without aneurysm. No enlarged lymph nodes. Inferior vena cava filter with its tip just above the  level of the renal veins. Reproductive: Status post hysterectomy. No adnexal masses. Other: Small amount of free peritoneal fluid in the pelvis. Musculoskeletal: Left hip prosthesis. Severe right hip degenerative changes with severe joint space narrowing, extensive subarticular cyst formation, spur formation and bony remodeling. Moderate levoconvex thoracolumbar scoliosis. Interbody and pedicle screw and rod fusion at the L4-5 level. Fusion of portions of the T12 and L1 vertebral bodies. Lumbar and lower thoracic spine degenerative changes. IMPRESSION: 1. Large amount of stool throughout the colon to the level of the distal sigmoid colon where there is a short segment of concentric, medium density wall thickening. This is concerning for an obstructing short segment mass. It would be unusual for edema due to diverticulitis to involve that short of a segment of bowel. Correlation with sigmoidoscopy or colonoscopy is recommended. 2. Mild pericolonic soft tissue stranding involving the distal sigmoid colon, most pronounced at the level of maximal distension of the colon by the stool. This is suspicious for mild diverticulitis without abscess. There is minimal diverticulosis in that region of the colon. 3. Cholelithiasis. 4. 5 mm probable hemangioma in the spleen. Electronically Signed   By: Claudie Revering M.D.   On: 02/20/2018 20:59   Dg Chest Port 1 View  Result Date: 03/04/2018 CLINICAL DATA:  Pulmonary  edema. EXAM: PORTABLE CHEST 1 VIEW COMPARISON:  03/03/2018 FINDINGS: 0538 hours. Endotracheal tube tip is 5.1 cm above the base of the carina. NG tube passes into the stomach. Right PICC line tip overlies the distal SVC level. Lung volumes are low. The cardio pericardial silhouette is enlarged. There is pulmonary vascular congestion without overt pulmonary edema. Basilar atelectasis noted bilaterally with tiny bilateral pleural effusions. IMPRESSION: Low lung volumes with cardiomegaly, vascular congestion and tiny effusions. Electronically Signed   By: Misty Stanley M.D.   On: 03/04/2018 08:39   Dg Chest Port 1 View  Result Date: 03/03/2018 CLINICAL DATA:  Pulmonary edema. EXAM: PORTABLE CHEST 1 VIEW COMPARISON:  Radiograph of March 02, 2018. FINDINGS: Stable cardiomediastinal silhouette. Atherosclerosis of thoracic aorta is noted. Endotracheal and nasogastric tubes are unchanged in position. Right-sided PICC line is unchanged in position. Stable hypoinflation of the lungs is noted with associated bibasilar atelectasis and small pleural effusions. No pneumothorax is noted. Severe degenerative changes seen involving the left glenohumeral joint. IMPRESSION: Stable support apparatus. Stable hypoinflation of the lungs with mild bibasilar subsegmental atelectasis and small pleural effusions. Aortic Atherosclerosis (ICD10-I70.0). Electronically Signed   By: Marijo Conception, M.D.   On: 03/03/2018 08:27   Dg Chest Port 1 View  Result Date: 03/02/2018 CLINICAL DATA:  Hypoxia EXAM: PORTABLE CHEST 1 VIEW COMPARISON:  March 01, 2018 FINDINGS: Endotracheal tube tip is 3.7 cm above the carina. Nasogastric tube tip and side port are in the stomach. Right subclavian catheter tip is at the cavoatrial junction. No pneumothorax. There is cardiomegaly with pulmonary venous hypertension. There are small pleural effusions bilaterally. There is atelectatic change in the lung bases. A degree of consolidation in the left base  is questioned. There is advanced arthropathy in the left shoulder. IMPRESSION: Tube and catheter positions as described without pneumothorax. Pulmonary vascular congestion present with small pleural effusions bilaterally. There is bibasilar atelectasis with a questionable degree of consolidation in the left base. Aortic Atherosclerosis (ICD10-I70.0). Electronically Signed   By: Lowella Grip III M.D.   On: 03/02/2018 07:38   Dg Chest Port 1 View  Result Date: 03/01/2018 CLINICAL DATA:  Ventilator dependent respiratory failure. Follow-up pulmonary edema. EXAM:  PORTABLE CHEST 1 VIEW COMPARISON:  02/28/2018, 02/28/2016 and earlier. FINDINGS: Endotracheal tube tip in satisfactory position projecting approximately 3-4 cm above the carina. RIGHT arm PICC tip projects at or near the cavoatrial junction, unchanged. Nasogastric tube looped in the stomach with its tip in the fundus. Markedly suboptimal inspiration with worsening atelectasis in the lung bases, LEFT greater than RIGHT. Resolution of interstitial pulmonary edema. Persistent pulmonary venous hypertension. IMPRESSION: 1.  Support apparatus satisfactory. 2. Resolution of interstitial pulmonary edema since yesterday, though pulmonary venous hypertension persists. 3. Worsening bibasilar atelectasis, LEFT greater than RIGHT. Electronically Signed   By: Evangeline Dakin M.D.   On: 03/01/2018 09:01   Dg Chest Port 1 View  Result Date: 02/28/2018 CLINICAL DATA:  Intubation. EXAM: PORTABLE CHEST 1 VIEW COMPARISON:  02/26/2018 FINDINGS: The endotracheal tube is 3.3 cm above the carina. There is an NG tube coursing down the esophagus and into the stomach. The right-sided PICC line is stable. Significant worsening lung aeration with very low lung volumes, vascular crowding, pulmonary edema and basilar atelectasis. The heart remains enlarged and there is tortuosity and calcification of the thoracic aorta. IMPRESSION: 1. The endotracheal tube and NG tubes are in  good position. 2. Very low lung volumes with vascular crowding and atelectasis 3. Vascular congestion and pulmonary edema. Electronically Signed   By: Marijo Sanes M.D.   On: 02/28/2018 16:37   Dg Chest Port 1 View  Result Date: 02/26/2018 CLINICAL DATA:  84 year old female with intermittent productive cough EXAM: PORTABLE CHEST 1 VIEW COMPARISON:  02/28/2016 FINDINGS: Cardiomediastinal silhouette unchanged. Interval development of asymmetric elevation the right hemidiaphragm. No pneumothorax. Low lung volumes with coarsened interstitial markings and interlobular septal thickening. No large pleural effusion. No confluent airspace disease. Interval placement of right upper extremity PICC with the tip appearing to terminate superior vena cava. Degenerative changes of the left glenohumeral joint. IMPRESSION: Low lung volumes with questionable edema. New asymmetric elevation the right hemidiaphragm, uncertain significance. Right upper extremity PICC. Electronically Signed   By: Corrie Mckusick D.O.   On: 02/26/2018 19:28   Dg Abd 2 Views  Result Date: 02/06/2018 CLINICAL DATA:  IVC filter placement evaluation. EXAM: ABDOMEN - 2 VIEW COMPARISON:  03/29/2017. FINDINGS: Lumbar spine numbered as per prior exam. IVC filter noted with tip at approximately the L1 level. Degenerative changes and scoliosis thoracic spine. L4-L5 fusion. Rounded calcific densities noted over the right upper quadrant, these could represent gallstones, kidney stones, undigested pill fragments. No bowel distention or free air. Stool noted throughout the colon and rectum. IMPRESSION: IVC filter noted with proximal tip at the L1 level. Electronically Signed   By: Marcello Moores  Register   On: 02/06/2018 14:21   [place] Ekg Site Rite  Result Date: 02/22/2018 If Site Rite image not attached, placement could not be confirmed due to current cardiac rhythm.  Time 30 minutes.   Barton Dubois, MD  [facility] Pager [phone] 03/07/2018, 5:19  PM   LOS: 15 days

## 2018-03-07 NOTE — Progress Notes (Signed)
7 Days Post-Op  Subjective: Patient continues to progress well.  She is alert and oriented.  Asking appropriate questions.  Complains of a sore throat.  Objective: Vital signs in last 24 hours: Temp:  [97.9 F (36.6 C)-98.2 F (36.8 C)] 98.2 F (36.8 C) (02/21 0519) Pulse Rate:  [61-75] 61 (02/21 0519) Resp:  [14-29] 18 (02/21 0519) BP: (101-130)/(48-60) 130/58 (02/21 0519) SpO2:  [92 %-98 %] 95 % (02/21 0743) Weight:  [109.9 kg] 109.9 kg (02/21 0519) Last BM Date: 03/07/18  Intake/Output from previous day: 02/20 0701 - 02/21 0700 In: 1193.3 [IV Piggyback:1193.3] Out: 2050 [Urine:1900; Stool:150] Intake/Output this shift: Total I/O In: 120 [P.O.:120] Out: 900 [Urine:900]  General appearance: alert, cooperative and no distress GI: Soft, incision healing well with packing in place.  Ostomy pink and patent.  Lab Results:  Recent Labs    03/06/18 0415 03/07/18 0526  WBC 17.0* 15.7*  HGB 8.5* 9.0*  HCT 26.7* 29.1*  PLT 278 297   BMET Recent Labs    03/06/18 0415 03/07/18 0526  NA 139 141  K 3.2* 3.5  CL 102 103  CO2 28 31  GLUCOSE 184* 120*  BUN 48* 43*  CREATININE 1.38* 1.31*  CALCIUM 8.0* 8.2*   PT/INR No results for input(s): LABPROT, INR in the last 72 hours.  Studies/Results: No results found.  Anti-infectives: Anti-infectives (From admission, onward)   Start     Dose/Rate Route Frequency Ordered Stop   03/06/18 1300  fluconazole (DIFLUCAN) IVPB 200 mg     200 mg 100 mL/hr over 60 Minutes Intravenous Every 24 hours 03/05/18 1216     03/05/18 1300  fluconazole (DIFLUCAN) IVPB 400 mg     400 mg 100 mL/hr over 120 Minutes Intravenous  Once 03/05/18 1216 03/05/18 1523   03/03/18 1700  meropenem (MERREM) 1 g in sodium chloride 0.9 % 100 mL IVPB  Status:  Discontinued     1 g 200 mL/hr over 30 Minutes Intravenous Every 12 hours 03/03/18 1000 03/03/18 1507   03/03/18 1700  ertapenem (INVANZ) 1,000 mg in sodium chloride 0.9 % 100 mL IVPB     1 g 200  mL/hr over 30 Minutes Intravenous Every 24 hours 03/03/18 1507     03/03/18 1600  linezolid (ZYVOX) IVPB 600 mg     600 mg 300 mL/hr over 60 Minutes Intravenous Every 12 hours 03/03/18 1507     03/02/18 1800  ertapenem (INVANZ) 1,000 mg in sodium chloride 0.9 % 100 mL IVPB  Status:  Discontinued     1 g 200 mL/hr over 30 Minutes Intravenous Every 24 hours 03/02/18 0829 03/03/18 0959   02/28/18 1800  ertapenem (INVANZ) 500 mg in sodium chloride 0.9 % 50 mL IVPB  Status:  Discontinued     500 mg 100 mL/hr over 30 Minutes Intravenous Every 24 hours 02/28/18 1035 03/02/18 0829   02/27/18 1800  ertapenem (INVANZ) 1,000 mg in sodium chloride 0.9 % 100 mL IVPB  Status:  Discontinued     1 g 200 mL/hr over 30 Minutes Intravenous Every 24 hours 02/27/18 1705 02/28/18 1035   02/21/18 0800  ciprofloxacin (CIPRO) IVPB 400 mg  Status:  Discontinued     400 mg 200 mL/hr over 60 Minutes Intravenous Every 12 hours 02/20/18 2204 02/27/18 1908   02/21/18 0500  metroNIDAZOLE (FLAGYL) IVPB 500 mg  Status:  Discontinued     500 mg 100 mL/hr over 60 Minutes Intravenous Every 8 hours 02/20/18 2204 02/27/18 1908  02/20/18 2115  ciprofloxacin (CIPRO) IVPB 400 mg     400 mg 200 mL/hr over 60 Minutes Intravenous  Once 02/20/18 2113 02/20/18 2335   02/20/18 2115  metroNIDAZOLE (FLAGYL) tablet 500 mg     500 mg Oral  Once 02/20/18 2113 02/20/18 2147      Assessment/Plan: s/p Procedure(s): PARTIAL COLECTOMY WITH COLOSTOMY Impression: Stable postoperative day 7.  Patient is now on the regular floor.  Her diet was advanced difficulty though she is having some difficulty swallowing.  The pured diet has been ordered.  Patient will require skilled nursing care upon discharge.  LOS: 15 days    [name] 03/07/2018

## 2018-03-07 NOTE — Consult Note (Signed)
[place] Nurse ostomy follow up Stoma type/location: LUQ colostomy.  Mucocutaneous separation from 6 to 9 o'clock.  Extends 2 cm. This area is cleaned and filled in with pectin barrier segment.  Stomal assessment/size: 1 5/8" os, points downward.  Peristomal assessment: MC separation otherwise intact Treatment options for stomal/peristomal skin: Filled in defect with barrier ring segment.  Used two piece pouch with barrier ring for added convexity.  This campus does not have flexible convexity at this time and I have requested this product again.  Pouch is cut off center to accommodate midline incision.  Output soft brown stool Ostomy pouching: 2pc. Flat with barrier ring  Education provided: sister and brother in law at bedside (the reside in [place] but are engaged and attentive to teaching) Discussed treatment for [facility] separation and the rationale for the barrier ring.  Her abdomen is very tender so I do not proceed with the ostomy belt I had planned to implement.  I did adjust it to fit and explained rationale to patient and her sister. It is available at bedside if needed/desired Enrolled patient in [place] Discharge program: Yes [place] team will continue to follow.  Domenic Moras MSN, RN, FNP-BC [facility] Wound, Ostomy, Continence Nurse Pager [phone]

## 2018-03-07 NOTE — Plan of Care (Signed)
  Problem: Spiritual Needs Goal: Ability to function at adequate level Outcome: Progressing   Problem: Education: Goal: Knowledge of General Education information will improve Description Including pain rating scale, medication(s)/side effects and non-pharmacologic comfort measures Outcome: Progressing   Problem: Health Behavior/Discharge Planning: Goal: Ability to manage health-related needs will improve Outcome: Progressing   Problem: Clinical Measurements: Goal: Ability to maintain clinical measurements within normal limits will improve Outcome: Progressing Goal: Will remain free from infection Outcome: Progressing Goal: Diagnostic test results will improve Outcome: Progressing Goal: Respiratory complications will improve Outcome: Progressing Goal: Cardiovascular complication will be avoided Outcome: Progressing   Problem: Activity: Goal: Risk for activity intolerance will decrease Outcome: Progressing   Problem: Nutrition: Goal: Adequate nutrition will be maintained Outcome: Progressing   Problem: Coping: Goal: Level of anxiety will decrease Outcome: Progressing   Problem: Elimination: Goal: Will not experience complications related to bowel motility Outcome: Progressing Goal: Will not experience complications related to urinary retention Outcome: Progressing   Problem: Pain Managment: Goal: General experience of comfort will improve Outcome: Progressing   Problem: Safety: Goal: Ability to remain free from injury will improve Outcome: Progressing   Problem: Skin Integrity: Goal: Risk for impaired skin integrity will decrease Outcome: Progressing   Problem: Education: Goal: Required Educational Video(s) Outcome: Progressing   Problem: Clinical Measurements: Goal: Ability to maintain clinical measurements within normal limits will improve Outcome: Progressing Goal: Postoperative complications will be avoided or minimized Outcome: Progressing    Problem: Skin Integrity: Goal: Demonstration of wound healing without infection will improve Outcome: Progressing   Problem: Activity: Goal: Ability to tolerate increased activity will improve Outcome: Progressing   Problem: Respiratory: Goal: Ability to maintain a clear airway and adequate ventilation will improve Outcome: Progressing   Problem: Role Relationship: Goal: Method of communication will improve Outcome: Progressing

## 2018-03-07 NOTE — Progress Notes (Signed)
Dressing to abdominal incision  Changed at this time for moderate amount of serosanguinous drainage.  Incision noted to have staples x3 with open areas in between.  Proximal open area packed with 1 saline moistened 2x2.  Mid open area packed with 1 saline moistened 4x4.  Distal open area packed with 1 saline moistened 4x4.  Incision covered with dry sterile 4x4's and abd pad and secured with tape.  Tolerated well by patient.

## 2018-03-07 NOTE — Progress Notes (Addendum)
Received to room 303 from ICU via bed. Oriented to room, bed and unit. Resting comfortably.

## 2018-03-08 LAB — RENAL FUNCTION PANEL
Albumin: 2 g/dL — ABNORMAL LOW (ref 3.5–5.0)
Anion gap: 10 (ref 5–15)
BUN: 37 mg/dL — ABNORMAL HIGH (ref 8–23)
CO2: 31 mmol/L (ref 22–32)
Calcium: 8.3 mg/dL — ABNORMAL LOW (ref 8.9–10.3)
Chloride: 99 mmol/L (ref 98–111)
Creatinine, Ser: 1.21 mg/dL — ABNORMAL HIGH (ref 0.44–1.00)
GFR calc Af Amer: 49 mL/min — ABNORMAL LOW (ref >60)
GFR calc non Af Amer: 42 mL/min — ABNORMAL LOW (ref >60)
Glucose, Bld: 131 mg/dL — ABNORMAL HIGH (ref 70–99)
Phosphorus: 3.1 mg/dL (ref 2.5–4.6)
Potassium: 3.5 mmol/L (ref 3.5–5.1)
Sodium: 140 mmol/L (ref 135–145)

## 2018-03-08 LAB — GLUCOSE, CAPILLARY
GLUCOSE-CAPILLARY: 133 mg/dL — AB (ref 70–99)
Glucose-Capillary: 128 mg/dL — ABNORMAL HIGH (ref 70–99)
Glucose-Capillary: 137 mg/dL — ABNORMAL HIGH (ref 70–99)
Glucose-Capillary: 141 mg/dL — ABNORMAL HIGH (ref 70–99)
Glucose-Capillary: 150 mg/dL — ABNORMAL HIGH (ref 70–99)
Glucose-Capillary: 178 mg/dL — ABNORMAL HIGH (ref 70–99)

## 2018-03-08 LAB — CBC
HCT: 30.2 % — ABNORMAL LOW (ref 36.0–46.0)
Hemoglobin: 9.4 g/dL — ABNORMAL LOW (ref 12.0–15.0)
MCH: 29.6 pg (ref 26.0–34.0)
MCHC: 31.1 g/dL (ref 30.0–36.0)
MCV: 95 fL (ref 80.0–100.0)
Platelets: 305 10*3/uL (ref 150–400)
RBC: 3.18 MIL/uL — ABNORMAL LOW (ref 3.87–5.11)
RDW: 14.9 % (ref 11.5–15.5)
WBC: 18.8 10*3/uL — ABNORMAL HIGH (ref 4.0–10.5)
nRBC: 0 % (ref 0.0–0.2)

## 2018-03-08 MED ORDER — FUROSEMIDE 10 MG/ML IJ SOLN
40.0000 mg | Freq: Two times a day (BID) | INTRAMUSCULAR | Status: DC
  Administered 2018-03-08 – 2018-03-11 (×6): 40 mg via INTRAVENOUS
  Filled 2018-03-08 (×6): qty 4

## 2018-03-08 NOTE — Progress Notes (Signed)
PROGRESS NOTE  Regina Hall HUD:[id number] DOB: 09/21/1934 DOA: 02/20/2018 PCP: [name], DO   Brief History: 84 year old female with hypertension, history of left leg DVT status post IVC filter presented to the hospital with abdominal pain. CT of the abdomen pelvis shows diverticulitis with large amount of stool throughout the colon and concern for underlying colonic obstruction. Underwent sigmoidoscopy showing obstruction in the sigmoid colon with marked edema. GI and surgery following. The patient subsequently developed a new fever. Blood and urine cultures were ordered. Repeat CT abd/pelvis ordered 2/13showed free air. Case was discussed with general surgery and plans to go to OR 2/14. Pt underwent a Hartman's procedure on 02/28/18. She was transferred to ICU on ventilator post-op.  Post-op, she was hypotensive and started on neosynephrine. She also developed new onset atrial fibrillation. She was started on amiodarone IVon 02/28/18. She converted to sinus after ~5 hours. She was kept on amiodarone due to continued critical illness and high risk of reverting back to afib.  She developed fluid overload due to fluid resuscitation and was started on furosemide  Assessment/Plan: Large bowel (sigmoid) obstruction -Underwent sigmoidoscopy showing obstruction in the sigmoid colon with marked edema. Surgery following and recommend initially non-operative tx  -2/7--Underwent sigmoidoscopy showing obstruction in the sigmoid colon with marked edema. -GI recommends outpatient colonoscopy in 6 weeks for full evaluation of the colon. -Sigmoidoscopy biopsy--tubular adenoma -02/27/18 CT abd--New small amount of free intraperitoneal air, consistent with bowel perforation -2/14--Hartmann procedure -Positive biopsy for serous adenocarcinoma.  Will follow further recommendation by general surgery. -Continue advancing diet.  Acute diverticulitiswith  perforation/peritonitis -cipro/flagyl d/ced -ertapenem started 2/13 -02/27/18 CT abd--New small amount of free intraperitoneal air, consistent with bowel perforation -2/14--Hartmann procedure -slowly advancing diet. -2/16--weaned off neosynephrine; stable VS off pressors.  -continue ertapenem, linezolid and antifungal regimen. -2/18--WBC up today likely due dehiscence of the abd wound and and concerns for peritonitis.     -will revisit to OR and exploration/re-closure of abd wound on 2/24.  New Onset Atrial Fibrillation -started amiodarone drip due to hypotension 02/28/18 -converted back to sinus after 4-5 hours on amio -continue amiodarone 200 mg bid by mouth, with intention to remain on amiodarone for 1 month and follow-up with cardiology service as an outpatient. -case discussed with Dr. Harl Bowie  Acute on chronic renal failure--CKD stage 3 -baseline creatinine 0.9-1.2 -nonoliguric -consult nephrology--appreciated recommendations and inputs. -due to infectious process and hemodynamic changes -remains fluid overloaded; continue lasix IV, start weaning -Follow-up renal function trend  Acute respiratory failure with hypoxia -due to hypoventilation, fluidoverload, bronchospasm -appreciate pulmonary service inputs -echo--EF 60-65%, mild LAE -Continue weaning oxygen supplementation -Start decreasing IV diuretics.  ??Hematemesis -RN reported multiple episodes of rust/red/brown emesis -consult GI--noted input -continuePPI bid -no further bleeding appreciated.  Bronchospasm/wheezing -continueas needed Xopenex/atrovent -continuepulmicort -2/16--repeat CXR--personally reviewed--mild improvement in interstitial markings, poor inspiration. -Repeat x-ray in a.m. -Steroids has been discontinued.  Chronic bilateral lower extremity edema -Suspect secondary to venous insufficiency/hypoalbuminemia, fluid overload -Improved and stable. -Will continue decreasing IV Lasix; now 40  BID.  Essential hypertension -Continue holding losartan in the setting of hypotension and acute kidney injury. -Blood pressure stable. -Continue to monitor  Physical deconditioning -PT eval-->SNF -Reports ambulating with a walker due to scoliosis prior to hospitalization.    Disposition Plan:Patient has tolerated well transition of amiodarone drip.  Continue IV diuresis (weaning down dose).  Follow general surgery recommendations.  Afebrile.  Abdominal wound got open, WBCs trending up  and the patient will require another visit to the OR to further explore abdominal cavity and reclosure abdominal wound.    Family Communication:Sister and brother-in-law at bedside.  Consultants:General surgery, GI, renal  Code Status: FULL   DVT Prophylaxis: [place] Lovenox   Procedures: As Listed in Progress Note Above  Antibiotics: cipro2/7>>>2/13 Flagyl2/7>>>2/13 Ertapenem 2/13>>>> linezolid 2/17>>> Fluconazole 2/19>>>   Subjective: No fever, cooperative and in no major distress.  Still using 1 L nasal cannula supplementation.  Denies chest pain or shortness of breath.  Patient abdominal wound got up and and there is a small loops of exposed.  WBCs trending up.  General surgery has placed temporary wound VAC in place.  Objective: Vitals:   03/07/18 2139 03/07/18 2142 03/08/18 0423 03/08/18 0742  BP: (!) 120/52  (!) 146/51   Pulse: 77  70   Resp: 15  16   Temp:  98.7 F (37.1 C) 98.6 F (37 C)   TempSrc:  Oral Oral   SpO2: 95%  97% 96%  Weight:      Height:        Intake/Output Summary (Last 24 hours) at 03/08/2018 1259 Last data filed at 03/08/2018 4270 Gross per 24 hour  Intake 730 ml  Output 3110 ml  Net -2380 ml   Weight change:    Exam: General exam: Alert, awake, oriented x 3; currently afebrile and in no acute distress.  Patient cooperative and just expressing feeling tired, weak and deconditioned.  Still using 1 L nasal cannula supplementation.  Good  urine output reported (over 2.3 L); still with mild fluid overload signs on exam. Respiratory system: Normal respiratory effort, positive rhonchi right, no wheezing. Cardiovascular system: Rate controlled, no murmurs, no rubs, no gallops. Gastrointestinal system: Abdomen is with wound VAC in place, patient had experience dehiscences of her wound; small loops exposed. Wound vac in place. Positive BS. No drainage  Central nervous system: Alert and oriented. No focal neurological deficits. Extremities: No Cyanosis or clubbing, positive trace edema bilaterally.  Skin: No rashes, no petechiae. Colostomy bag in place; abdominal wound with wound back in place now.  Psychiatry: Judgement and insight appear normal. Mood & affect appropriate.   Data Reviewed: I have personally reviewed following labs and imaging studies  Basic Metabolic Panel: Recent Labs  Lab 03/04/18 0618 03/05/18 0404 03/06/18 0415 03/07/18 0526 03/08/18 0611  NA 140  141 139 139 141 140  K 3.3*  3.4* 3.3* 3.2* 3.5 3.5  CL 106  106 101 102 103 99  CO2 23  24 28 28 31 31   GLUCOSE 126*  127* 210* 184* 120* 131*  BUN 54*  55* 56* 48* 43* 37*  CREATININE 1.35*  1.37* 1.39* 1.38* 1.31* 1.21*  CALCIUM 8.9  9.0 8.4* 8.0* 8.2* 8.3*  MG  --   --   --  1.8  --   PHOS 3.6 4.1 3.1 3.3 3.1   Liver Function Tests: Recent Labs  Lab 03/03/18 0445 03/04/18 0618 03/05/18 0404 03/06/18 0415 03/07/18 0526 03/08/18 0611  AST 11* 11*  --   --   --   --   ALT 9 8  --   --   --   --   ALKPHOS 33* 33*  --   --   --   --   BILITOT 0.3 0.6  --   --   --   --   PROT 4.3* 5.6*  --   --   --   --  ALBUMIN 1.8* 3.3*  3.2* 2.4* 2.0* 2.0* 2.0*   CBC: Recent Labs  Lab 03/04/18 0618 03/05/18 0404 03/06/18 0415 03/07/18 0526 03/08/18 0611  WBC 20.3* 19.2* 17.0* 15.7* 18.8*  HGB 9.5* 8.6* 8.5* 9.0* 9.4*  HCT 29.6* 27.0* 26.7* 29.1* 30.2*  MCV 91.6 92.5 93.0 94.8 95.0  PLT 279 275 278 297 305   CBG: Recent Labs  Lab  03/07/18 1956 03/07/18 2346 03/08/18 0353 03/08/18 0803 03/08/18 1105  GLUCAP 160* 139* 141* 137* 128*   Urine analysis:    Component Value Date/Time   COLORURINE YELLOW 02/26/2018 [place] 02/26/2018 1712   LABSPEC 1.014 02/26/2018 1712   PHURINE 8.0 02/26/2018 1712   GLUCOSEU NEGATIVE 02/26/2018 1712   HGBUR SMALL (A) 02/26/2018 1712   BILIRUBINUR NEGATIVE 02/26/2018 1712   KETONESUR NEGATIVE 02/26/2018 1712   PROTEINUR NEGATIVE 02/26/2018 1712   UROBILINOGEN 0.2 04/21/2014 1200   NITRITE NEGATIVE 02/26/2018 1712   LEUKOCYTESUR NEGATIVE 02/26/2018 1712    Recent Results (from the past 240 hour(s))  Culture, Urine     Status: None   Collection Time: 02/26/18  5:12 PM  Result Value Ref Range Status   Specimen Description   Final    URINE, RANDOM Performed at [facility], [address] [place], [place] [address]    Special Requests   Final    NONE Performed at [facility], [address], [place], [place] [address]    Culture   Final    NO GROWTH Performed at [facility], [place] [address] [place], [place] [address]    Report Status 02/28/2018 FINAL  Final  Respiratory Panel by PCR     Status: None   Collection Time: 02/27/18  8:07 AM  Result Value Ref Range Status   Adenovirus NOT DETECTED NOT DETECTED Final   Coronavirus 229E NOT DETECTED NOT DETECTED Final    Comment: (NOTE) The Coronavirus on the Respiratory Panel, DOES NOT test for the novel  Coronavirus (2019 nCoV)    Coronavirus HKU1 NOT DETECTED NOT DETECTED Final   Coronavirus NL63 NOT DETECTED NOT DETECTED Final   Coronavirus OC43 NOT DETECTED NOT DETECTED Final   Metapneumovirus NOT DETECTED NOT DETECTED Final   Rhinovirus / Enterovirus NOT DETECTED NOT DETECTED Final   Influenza A NOT DETECTED NOT DETECTED Final   Influenza B NOT DETECTED NOT DETECTED Final   Parainfluenza Virus 1 NOT DETECTED NOT DETECTED Final   Parainfluenza Virus 2 NOT DETECTED NOT DETECTED Final    Parainfluenza Virus 3 NOT DETECTED NOT DETECTED Final   Parainfluenza Virus 4 NOT DETECTED NOT DETECTED Final   Respiratory Syncytial Virus NOT DETECTED NOT DETECTED Final   Bordetella pertussis NOT DETECTED NOT DETECTED Final   Chlamydophila pneumoniae NOT DETECTED NOT DETECTED Final   Mycoplasma pneumoniae NOT DETECTED NOT DETECTED Final    Comment: Performed at [facility], [place] [address], [place], [place] [address]  Surgical PCR screen     Status: None   Collection Time: 02/28/18  7:52 AM  Result Value Ref Range Status   MRSA, PCR NEGATIVE NEGATIVE Final   Staphylococcus aureus NEGATIVE NEGATIVE Final    Comment: (NOTE) The Xpert SA Assay (FDA approved for NASAL specimens in patients 72 years of age and older), is one component of a comprehensive surveillance program. It is not intended to diagnose infection nor to guide or monitor treatment. Performed at [facility], [address] [place],  [address]   Aerobic/Anaerobic Culture (  surgical/deep wound)     Status: None   Collection Time: 02/28/18  2:27 PM  Result Value Ref Range Status   Specimen Description   Final    WOUND Performed at [facility], [address], [place], [place] [address]    Special Requests   Final    ABSCESS Performed at [facility], [address], [place], [place] [address]    Gram Stain   Final    RARE WBC PRESENT, PREDOMINANTLY PMN RARE GRAM POSITIVE COCCI RARE GRAM VARIABLE ROD RARE GRAM POSITIVE RODS    Culture   Final    FEW VANCOMYCIN RESISTANT ENTEROCOCCUS NO ANAEROBES ISOLATED Performed at [facility], [address] [place], [place] [address]    Report Status 03/05/2018 FINAL  Final   Organism ID, Bacteria VANCOMYCIN RESISTANT ENTEROCOCCUS  Final      Susceptibility   Vancomycin resistant enterococcus - MIC*    AMPICILLIN >=32 RESISTANT Resistant     VANCOMYCIN >=32 RESISTANT Resistant     GENTAMICIN SYNERGY SENSITIVE Sensitive     LINEZOLID 2 SENSITIVE  Sensitive     * FEW VANCOMYCIN RESISTANT ENTEROCOCCUS     Scheduled Meds: . amiodarone  200 mg Oral BID  . budesonide (PULMICORT) nebulizer solution  0.5 mg Nebulization BID  . Chlorhexidine Gluconate Cloth  6 each Topical Daily  . enoxaparin (LOVENOX) injection  40 mg Subcutaneous Q24H  . feeding supplement (ENSURE ENLIVE)  237 mL Oral BID BM  . furosemide  40 mg Intravenous BID  . ipratropium  0.5 mg Nebulization TID  . levalbuterol  1.25 mg Nebulization TID  . pantoprazole  40 mg Oral Q1200  . potassium chloride  40 mEq Oral Daily  . sodium chloride flush  10-40 mL Intracatheter Q12H   Continuous Infusions: . sodium chloride Stopped (03/05/18 1323)  . ertapenem 1,000 mg (03/07/18 1736)  . fluconazole (DIFLUCAN) IV 200 mg (03/07/18 1414)  . linezolid (ZYVOX) IV 600 mg (03/08/18 1111)    Procedures/Studies: Ct Abdomen Pelvis Wo Contrast  Result Date: 02/27/2018 CLINICAL DATA:  Follow-up diverticulitis. Chronic abdominal pain. EXAM: CT ABDOMEN AND PELVIS WITHOUT CONTRAST TECHNIQUE: Multidetector CT imaging of the abdomen and pelvis was performed following the standard protocol without IV contrast. COMPARISON:  02/20/2018 FINDINGS: Lower chest: New small bilateral pleural effusions and bibasilar atelectasis. Hepatobiliary: Stable small cyst in left hepatic lobe. No mass visualized on this unenhanced exam. Tiny gallstones again noted, however there is no evidence of cholecystitis or biliary ductal dilatation. Pancreas: No mass or inflammatory process visualized on this unenhanced exam. Spleen:  Within normal limits in size. Adrenals/Urinary tract: No evidence of urolithiasis or hydronephrosis. Unremarkable unopacified urinary bladder. Stomach/Bowel: New small amount of free intraperitoneal air is seen, consistent with bowel perforation. Mild ascites is new since previous study, as well as diffuse mesenteric and body wall edema. Large amount of stool is again seen throughout the colon. A  transition point is again seen near the rectosigmoid junction, consistent with stricture. No definite soft tissue mass appreciated by CT. Mild diverticulosis and wall thickening is seen involving the proximal sigmoid, which is similar to previous study and suspicious for mild diverticulitis. No evidence of small bowel wall thickening or obstruction. No abscess identified. Vascular/Lymphatic: No pathologically enlarged lymph nodes identified. No evidence of abdominal aortic aneurysm. Reproductive: Prior hysterectomy. Limited visualization due to artifact from left hip prosthesis. Other:  None. Musculoskeletal:  No suspicious bone lesions identified. IMPRESSION: 1. New small amount of free intraperitoneal air,  consistent with bowel perforation. 2. Large colonic stool burden with transition point near the rectosigmoid junction, consistent with stricture. No definite soft tissue mass appreciated by CT. This is unchanged in appearance compared to previous study. Recommend correlation with colonoscopy to exclude obstructing colon carcinoma. 3. Mild diverticulitis of proximal sigmoid colon, without significant change. 4. New mild ascites and diffuse body wall edema. New small bilateral pleural effusions and bibasilar atelectasis. 5. Cholelithiasis. No radiographic evidence of cholecystitis. Critical Value/emergent results were called by telephone at the time of interpretation on 02/27/2018 at 4:35 pm to Dr. Shanon Brow TAT , who verbally acknowledged these results. Electronically Signed   By: Earle Gell M.D.   On: 02/27/2018 16:38   Ct Abdomen Pelvis W Contrast  Result Date: 02/20/2018 CLINICAL DATA:  Lower abdominal pain and constipation for the past 2 days. Clinical suspicion for diverticulitis. EXAM: CT ABDOMEN AND PELVIS WITH CONTRAST TECHNIQUE: Multidetector CT imaging of the abdomen and pelvis was performed using the standard protocol following bolus administration of intravenous contrast. CONTRAST:  183mL OMNIPAQUE  IOHEXOL 300 MG/ML  SOLN COMPARISON:  Abdomen radiographs dated 02/06/2018. Abdomen and pelvis CT dated 04/08/2015. FINDINGS: Lower chest: Unremarkable. Hepatobiliary: No significant change in previously demonstrated liver cysts. Small number of tiny gallstones in the gallbladder measuring up to 4 mm in maximum diameter each. No gallbladder wall thickening or pericholecystic fluid. Pancreas: Unremarkable. No pancreatic ductal dilatation or surrounding inflammatory changes. Spleen: 5 mm oval area of low density in the spleen on image number 29 series 2, difficult to detect with certainty on the previous examination, partly due to differences in technique and timing. Adrenals/Urinary Tract: Normal appearing adrenal glands. Stable tiny exophytic upper pole left renal cyst. Normal appearing right kidney, ureters and urinary bladder. Stomach/Bowel: Large amount of stool throughout the colon to the level of the distal sigmoid colon where there is a short segment of concentric, medium density wall thickening, best seen on image number 72 series 2. The wall thickening is concentric and lower in density on coronal image number 90. Lesser amount of stool and gas in the normal caliber rectum. Minimal sigmoid colon diverticulosis. Mild pericolonic soft tissue stranding involving the distal sigmoid colon, most pronounced at the level of maximal distension of the colon by the stool. Normal appearing stomach, small bowel and appendix. Vascular/Lymphatic: Atheromatous arterial calcifications without aneurysm. No enlarged lymph nodes. Inferior vena cava filter with its tip just above the level of the renal veins. Reproductive: Status post hysterectomy. No adnexal masses. Other: Small amount of free peritoneal fluid in the pelvis. Musculoskeletal: Left hip prosthesis. Severe right hip degenerative changes with severe joint space narrowing, extensive subarticular cyst formation, spur formation and bony remodeling. Moderate levoconvex  thoracolumbar scoliosis. Interbody and pedicle screw and rod fusion at the L4-5 level. Fusion of portions of the T12 and L1 vertebral bodies. Lumbar and lower thoracic spine degenerative changes. IMPRESSION: 1. Large amount of stool throughout the colon to the level of the distal sigmoid colon where there is a short segment of concentric, medium density wall thickening. This is concerning for an obstructing short segment mass. It would be unusual for edema due to diverticulitis to involve that short of a segment of bowel. Correlation with sigmoidoscopy or colonoscopy is recommended. 2. Mild pericolonic soft tissue stranding involving the distal sigmoid colon, most pronounced at the level of maximal distension of the colon by the stool. This is suspicious for mild diverticulitis without abscess. There is minimal diverticulosis in that region of the  colon. 3. Cholelithiasis. 4. 5 mm probable hemangioma in the spleen. Electronically Signed   By: Claudie Revering M.D.   On: 02/20/2018 20:59   Dg Chest Port 1 View  Result Date: 03/04/2018 CLINICAL DATA:  Pulmonary edema. EXAM: PORTABLE CHEST 1 VIEW COMPARISON:  03/03/2018 FINDINGS: 0538 hours. Endotracheal tube tip is 5.1 cm above the base of the carina. NG tube passes into the stomach. Right PICC line tip overlies the distal SVC level. Lung volumes are low. The cardio pericardial silhouette is enlarged. There is pulmonary vascular congestion without overt pulmonary edema. Basilar atelectasis noted bilaterally with tiny bilateral pleural effusions. IMPRESSION: Low lung volumes with cardiomegaly, vascular congestion and tiny effusions. Electronically Signed   By: Misty Stanley M.D.   On: 03/04/2018 08:39   Dg Chest Port 1 View  Result Date: 03/03/2018 CLINICAL DATA:  Pulmonary edema. EXAM: PORTABLE CHEST 1 VIEW COMPARISON:  Radiograph of March 02, 2018. FINDINGS: Stable cardiomediastinal silhouette. Atherosclerosis of thoracic aorta is noted. Endotracheal and  nasogastric tubes are unchanged in position. Right-sided PICC line is unchanged in position. Stable hypoinflation of the lungs is noted with associated bibasilar atelectasis and small pleural effusions. No pneumothorax is noted. Severe degenerative changes seen involving the left glenohumeral joint. IMPRESSION: Stable support apparatus. Stable hypoinflation of the lungs with mild bibasilar subsegmental atelectasis and small pleural effusions. Aortic Atherosclerosis (ICD10-I70.0). Electronically Signed   By: Marijo Conception, M.D.   On: 03/03/2018 08:27   Dg Chest Port 1 View  Result Date: 03/02/2018 CLINICAL DATA:  Hypoxia EXAM: PORTABLE CHEST 1 VIEW COMPARISON:  March 01, 2018 FINDINGS: Endotracheal tube tip is 3.7 cm above the carina. Nasogastric tube tip and side port are in the stomach. Right subclavian catheter tip is at the cavoatrial junction. No pneumothorax. There is cardiomegaly with pulmonary venous hypertension. There are small pleural effusions bilaterally. There is atelectatic change in the lung bases. A degree of consolidation in the left base is questioned. There is advanced arthropathy in the left shoulder. IMPRESSION: Tube and catheter positions as described without pneumothorax. Pulmonary vascular congestion present with small pleural effusions bilaterally. There is bibasilar atelectasis with a questionable degree of consolidation in the left base. Aortic Atherosclerosis (ICD10-I70.0). Electronically Signed   By: Lowella Grip III M.D.   On: 03/02/2018 07:38   Dg Chest Port 1 View  Result Date: 03/01/2018 CLINICAL DATA:  Ventilator dependent respiratory failure. Follow-up pulmonary edema. EXAM: PORTABLE CHEST 1 VIEW COMPARISON:  02/28/2018, 02/28/2016 and earlier. FINDINGS: Endotracheal tube tip in satisfactory position projecting approximately 3-4 cm above the carina. RIGHT arm PICC tip projects at or near the cavoatrial junction, unchanged. Nasogastric tube looped in the stomach  with its tip in the fundus. Markedly suboptimal inspiration with worsening atelectasis in the lung bases, LEFT greater than RIGHT. Resolution of interstitial pulmonary edema. Persistent pulmonary venous hypertension. IMPRESSION: 1.  Support apparatus satisfactory. 2. Resolution of interstitial pulmonary edema since yesterday, though pulmonary venous hypertension persists. 3. Worsening bibasilar atelectasis, LEFT greater than RIGHT. Electronically Signed   By: Evangeline Dakin M.D.   On: 03/01/2018 09:01   Dg Chest Port 1 View  Result Date: 02/28/2018 CLINICAL DATA:  Intubation. EXAM: PORTABLE CHEST 1 VIEW COMPARISON:  02/26/2018 FINDINGS: The endotracheal tube is 3.3 cm above the carina. There is an NG tube coursing down the esophagus and into the stomach. The right-sided PICC line is stable. Significant worsening lung aeration with very low lung volumes, vascular crowding, pulmonary edema and basilar atelectasis. The heart  remains enlarged and there is tortuosity and calcification of the thoracic aorta. IMPRESSION: 1. The endotracheal tube and NG tubes are in good position. 2. Very low lung volumes with vascular crowding and atelectasis 3. Vascular congestion and pulmonary edema. Electronically Signed   By: Marijo Sanes M.D.   On: 02/28/2018 16:37   Dg Chest Port 1 View  Result Date: 02/26/2018 CLINICAL DATA:  84 year old female with intermittent productive cough EXAM: PORTABLE CHEST 1 VIEW COMPARISON:  02/28/2016 FINDINGS: Cardiomediastinal silhouette unchanged. Interval development of asymmetric elevation the right hemidiaphragm. No pneumothorax. Low lung volumes with coarsened interstitial markings and interlobular septal thickening. No large pleural effusion. No confluent airspace disease. Interval placement of right upper extremity PICC with the tip appearing to terminate superior vena cava. Degenerative changes of the left glenohumeral joint. IMPRESSION: Low lung volumes with questionable edema. New  asymmetric elevation the right hemidiaphragm, uncertain significance. Right upper extremity PICC. Electronically Signed   By: Corrie Mckusick D.O.   On: 02/26/2018 19:28   [place] Ekg Site Rite  Result Date: 02/22/2018 If Site Rite image not attached, placement could not be confirmed due to current cardiac rhythm.  Time 30 minutes.   Barton Dubois, MD  [facility] Pager [phone] 03/08/2018, 12:59 PM   LOS: 16 days

## 2018-03-08 NOTE — H&P (View-Only) (Signed)
8 Days Post-Op  Subjective: Patient resting comfortably.  Alert and oriented.  Objective: Vital signs in last 24 hours: Temp:  [98.3 F (36.8 C)-98.7 F (37.1 C)] 98.6 F (37 C) (02/22 0423) Pulse Rate:  [66-77] 70 (02/22 0423) Resp:  [15-18] 16 (02/22 0423) BP: (120-146)/(51-63) 146/51 (02/22 0423) SpO2:  [95 %-97 %] 96 % (02/22 0742) Last BM Date: 03/07/18  Intake/Output from previous day: 02/21 0701 - 02/22 0700 In: 610 [P.O.:600; I.V.:10] Out: 3060 [Urine:2900; Stool:160] Intake/Output this shift: Total I/O In: 240 [P.O.:240] Out: 600 [Urine:600]  General appearance: alert, cooperative and no distress GI: Incision with staples in place, but small bowel loops exposed.  No purulent drainage noted.  White and black wound VAC sponges placed.  Lab Results:  Recent Labs    03/07/18 0526 03/08/18 0611  WBC 15.7* 18.8*  HGB 9.0* 9.4*  HCT 29.1* 30.2*  PLT 297 305   BMET Recent Labs    03/07/18 0526 03/08/18 0611  NA 141 140  K 3.5 3.5  CL 103 99  CO2 31 31  GLUCOSE 120* 131*  BUN 43* 37*  CREATININE 1.31* 1.21*  CALCIUM 8.2* 8.3*   PT/INR No results for input(s): LABPROT, INR in the last 72 hours.  Studies/Results: No results found.  Anti-infectives: Anti-infectives (From admission, onward)   Start     Dose/Rate Route Frequency Ordered Stop   03/06/18 1300  fluconazole (DIFLUCAN) IVPB 200 mg     200 mg 100 mL/hr over 60 Minutes Intravenous Every 24 hours 03/05/18 1216     03/05/18 1300  fluconazole (DIFLUCAN) IVPB 400 mg     400 mg 100 mL/hr over 120 Minutes Intravenous  Once 03/05/18 1216 03/05/18 1523   03/03/18 1700  meropenem (MERREM) 1 g in sodium chloride 0.9 % 100 mL IVPB  Status:  Discontinued     1 g 200 mL/hr over 30 Minutes Intravenous Every 12 hours 03/03/18 1000 03/03/18 1507   03/03/18 1700  ertapenem (INVANZ) 1,000 mg in sodium chloride 0.9 % 100 mL IVPB     1 g 200 mL/hr over 30 Minutes Intravenous Every 24 hours 03/03/18 1507     03/03/18 1600  linezolid (ZYVOX) IVPB 600 mg     600 mg 300 mL/hr over 60 Minutes Intravenous Every 12 hours 03/03/18 1507     03/02/18 1800  ertapenem (INVANZ) 1,000 mg in sodium chloride 0.9 % 100 mL IVPB  Status:  Discontinued     1 g 200 mL/hr over 30 Minutes Intravenous Every 24 hours 03/02/18 0829 03/03/18 0959   02/28/18 1800  ertapenem (INVANZ) 500 mg in sodium chloride 0.9 % 50 mL IVPB  Status:  Discontinued     500 mg 100 mL/hr over 30 Minutes Intravenous Every 24 hours 02/28/18 1035 03/02/18 0829   02/27/18 1800  ertapenem (INVANZ) 1,000 mg in sodium chloride 0.9 % 100 mL IVPB  Status:  Discontinued     1 g 200 mL/hr over 30 Minutes Intravenous Every 24 hours 02/27/18 1705 02/28/18 1035   02/21/18 0800  ciprofloxacin (CIPRO) IVPB 400 mg  Status:  Discontinued     400 mg 200 mL/hr over 60 Minutes Intravenous Every 12 hours 02/20/18 2204 02/27/18 1908   02/21/18 0500  metroNIDAZOLE (FLAGYL) IVPB 500 mg  Status:  Discontinued     500 mg 100 mL/hr over 60 Minutes Intravenous Every 8 hours 02/20/18 2204 02/27/18 1908   02/20/18 2115  ciprofloxacin (CIPRO) IVPB 400 mg     400  mg 200 mL/hr over 60 Minutes Intravenous  Once 02/20/18 2113 02/20/18 2335   02/20/18 2115  metroNIDAZOLE (FLAGYL) tablet 500 mg     500 mg Oral  Once 02/20/18 2113 02/20/18 2147      Assessment/Plan: s/p Procedure(s): PARTIAL COLECTOMY WITH COLOSTOMY Impression: Fascial dehiscence on postoperative day 8.  Not surprised as the patient had fecal peritonitis, hypotension, prolonged vent stay in ICU. Plan: We will take patient back to the operating room on 03/10/2018 for exploratory laparotomy to make sure there is no further abscess cavities or stool in the abdomen.  We will also secondarily close wound.  Risks and benefits of the procedure were explained to the patient and family, who gave informed consent.  LOS: 16 days    [name] 03/08/2018

## 2018-03-08 NOTE — Progress Notes (Signed)
8 Days Post-Op  Subjective: Patient resting comfortably.  Alert and oriented.  Objective: Vital signs in last 24 hours: Temp:  [98.3 F (36.8 C)-98.7 F (37.1 C)] 98.6 F (37 C) (02/22 0423) Pulse Rate:  [66-77] 70 (02/22 0423) Resp:  [15-18] 16 (02/22 0423) BP: (120-146)/(51-63) 146/51 (02/22 0423) SpO2:  [95 %-97 %] 96 % (02/22 0742) Last BM Date: 03/07/18  Intake/Output from previous day: 02/21 0701 - 02/22 0700 In: 610 [P.O.:600; I.V.:10] Out: 3060 [Urine:2900; Stool:160] Intake/Output this shift: Total I/O In: 240 [P.O.:240] Out: 600 [Urine:600]  General appearance: alert, cooperative and no distress GI: Incision with staples in place, but small bowel loops exposed.  No purulent drainage noted.  White and black wound VAC sponges placed.  Lab Results:  Recent Labs    03/07/18 0526 03/08/18 0611  WBC 15.7* 18.8*  HGB 9.0* 9.4*  HCT 29.1* 30.2*  PLT 297 305   BMET Recent Labs    03/07/18 0526 03/08/18 0611  NA 141 140  K 3.5 3.5  CL 103 99  CO2 31 31  GLUCOSE 120* 131*  BUN 43* 37*  CREATININE 1.31* 1.21*  CALCIUM 8.2* 8.3*   PT/INR No results for input(s): LABPROT, INR in the last 72 hours.  Studies/Results: No results found.  Anti-infectives: Anti-infectives (From admission, onward)   Start     Dose/Rate Route Frequency Ordered Stop   03/06/18 1300  fluconazole (DIFLUCAN) IVPB 200 mg     200 mg 100 mL/hr over 60 Minutes Intravenous Every 24 hours 03/05/18 1216     03/05/18 1300  fluconazole (DIFLUCAN) IVPB 400 mg     400 mg 100 mL/hr over 120 Minutes Intravenous  Once 03/05/18 1216 03/05/18 1523   03/03/18 1700  meropenem (MERREM) 1 g in sodium chloride 0.9 % 100 mL IVPB  Status:  Discontinued     1 g 200 mL/hr over 30 Minutes Intravenous Every 12 hours 03/03/18 1000 03/03/18 1507   03/03/18 1700  ertapenem (INVANZ) 1,000 mg in sodium chloride 0.9 % 100 mL IVPB     1 g 200 mL/hr over 30 Minutes Intravenous Every 24 hours 03/03/18 1507     03/03/18 1600  linezolid (ZYVOX) IVPB 600 mg     600 mg 300 mL/hr over 60 Minutes Intravenous Every 12 hours 03/03/18 1507     03/02/18 1800  ertapenem (INVANZ) 1,000 mg in sodium chloride 0.9 % 100 mL IVPB  Status:  Discontinued     1 g 200 mL/hr over 30 Minutes Intravenous Every 24 hours 03/02/18 0829 03/03/18 0959   02/28/18 1800  ertapenem (INVANZ) 500 mg in sodium chloride 0.9 % 50 mL IVPB  Status:  Discontinued     500 mg 100 mL/hr over 30 Minutes Intravenous Every 24 hours 02/28/18 1035 03/02/18 0829   02/27/18 1800  ertapenem (INVANZ) 1,000 mg in sodium chloride 0.9 % 100 mL IVPB  Status:  Discontinued     1 g 200 mL/hr over 30 Minutes Intravenous Every 24 hours 02/27/18 1705 02/28/18 1035   02/21/18 0800  ciprofloxacin (CIPRO) IVPB 400 mg  Status:  Discontinued     400 mg 200 mL/hr over 60 Minutes Intravenous Every 12 hours 02/20/18 2204 02/27/18 1908   02/21/18 0500  metroNIDAZOLE (FLAGYL) IVPB 500 mg  Status:  Discontinued     500 mg 100 mL/hr over 60 Minutes Intravenous Every 8 hours 02/20/18 2204 02/27/18 1908   02/20/18 2115  ciprofloxacin (CIPRO) IVPB 400 mg     400  mg 200 mL/hr over 60 Minutes Intravenous  Once 02/20/18 2113 02/20/18 2335   02/20/18 2115  metroNIDAZOLE (FLAGYL) tablet 500 mg     500 mg Oral  Once 02/20/18 2113 02/20/18 2147      Assessment/Plan: s/p Procedure(s): PARTIAL COLECTOMY WITH COLOSTOMY Impression: Fascial dehiscence on postoperative day 8.  Not surprised as the patient had fecal peritonitis, hypotension, prolonged vent stay in ICU. Plan: We will take patient back to the operating room on 03/10/2018 for exploratory laparotomy to make sure there is no further abscess cavities or stool in the abdomen.  We will also secondarily close wound.  Risks and benefits of the procedure were explained to the patient and family, who gave informed consent.  LOS: 16 days    [name] 03/08/2018

## 2018-03-09 LAB — GLUCOSE, CAPILLARY
Glucose-Capillary: 118 mg/dL — ABNORMAL HIGH (ref 70–99)
Glucose-Capillary: 124 mg/dL — ABNORMAL HIGH (ref 70–99)
Glucose-Capillary: 122 mg/dL — ABNORMAL HIGH (ref 70–99)
Glucose-Capillary: 147 mg/dL — ABNORMAL HIGH (ref 70–99)
Glucose-Capillary: 147 mg/dL — ABNORMAL HIGH (ref 70–99)

## 2018-03-09 LAB — RENAL FUNCTION PANEL
Albumin: 2 g/dL — ABNORMAL LOW (ref 3.5–5.0)
Anion gap: 8 (ref 5–15)
BUN: 34 mg/dL — ABNORMAL HIGH (ref 8–23)
CO2: 32 mmol/L (ref 22–32)
Calcium: 8.3 mg/dL — ABNORMAL LOW (ref 8.9–10.3)
Chloride: 99 mmol/L (ref 98–111)
Creatinine, Ser: 1.24 mg/dL — ABNORMAL HIGH (ref 0.44–1.00)
GFR calc non Af Amer: 41 mL/min — ABNORMAL LOW (ref >60)
GFR, EST AFRICAN AMERICAN: 47 mL/min — AB (ref >60)
Glucose, Bld: 120 mg/dL — ABNORMAL HIGH (ref 70–99)
Phosphorus: 3.2 mg/dL (ref 2.5–4.6)
Potassium: 3.5 mmol/L (ref 3.5–5.1)
Sodium: 139 mmol/L (ref 135–145)

## 2018-03-09 MED ORDER — CHLORHEXIDINE GLUCONATE CLOTH 2 % EX PADS
6.0000 | MEDICATED_PAD | Freq: Once | CUTANEOUS | Status: AC
  Administered 2018-03-10: 6 via TOPICAL

## 2018-03-09 MED ORDER — CHLORHEXIDINE GLUCONATE CLOTH 2 % EX PADS
6.0000 | MEDICATED_PAD | Freq: Once | CUTANEOUS | Status: AC
  Administered 2018-03-09: 6 via TOPICAL

## 2018-03-09 NOTE — H&P (View-Only) (Signed)
9 Days Post-Op  Subjective: Patient alert and oriented.  Denies any abdominal pain.  Objective: Vital signs in last 24 hours: Temp:  [97.3 F (36.3 C)-98.8 F (37.1 C)] 98.2 F (36.8 C) (02/23 0439) Pulse Rate:  [66-76] 66 (02/23 0439) Resp:  [15-20] 20 (02/23 0439) BP: (116-131)/(41-49) 131/41 (02/23 0439) SpO2:  [94 %-98 %] 96 % (02/23 0732) Weight:  [108.9 kg] 108.9 kg (02/23 0439) Last BM Date: 03/08/18  Intake/Output from previous day: 02/22 0701 - 02/23 0700 In: 370 [P.O.:360; I.V.:10] Out: 2650 [Urine:2650] Intake/Output this shift: Total I/O In: 240 [P.O.:240] Out: 900 [Urine:900]  General appearance: alert, cooperative and no distress GI: Wound VAC in place.  Soft.  Colostomy pink and patent.  Lab Results:  Recent Labs    03/07/18 0526 03/08/18 0611  WBC 15.7* 18.8*  HGB 9.0* 9.4*  HCT 29.1* 30.2*  PLT 297 305   BMET Recent Labs    03/08/18 0611 03/09/18 0610  NA 140 139  K 3.5 3.5  CL 99 99  CO2 31 32  GLUCOSE 131* 120*  BUN 37* 34*  CREATININE 1.21* 1.24*  CALCIUM 8.3* 8.3*   PT/INR No results for input(s): LABPROT, INR in the last 72 hours.  Studies/Results: No results found.  Anti-infectives: Anti-infectives (From admission, onward)   Start     Dose/Rate Route Frequency Ordered Stop   03/06/18 1300  fluconazole (DIFLUCAN) IVPB 200 mg     200 mg 100 mL/hr over 60 Minutes Intravenous Every 24 hours 03/05/18 1216     03/05/18 1300  fluconazole (DIFLUCAN) IVPB 400 mg     400 mg 100 mL/hr over 120 Minutes Intravenous  Once 03/05/18 1216 03/05/18 1523   03/03/18 1700  meropenem (MERREM) 1 g in sodium chloride 0.9 % 100 mL IVPB  Status:  Discontinued     1 g 200 mL/hr over 30 Minutes Intravenous Every 12 hours 03/03/18 1000 03/03/18 1507   03/03/18 1700  ertapenem (INVANZ) 1,000 mg in sodium chloride 0.9 % 100 mL IVPB     1 g 200 mL/hr over 30 Minutes Intravenous Every 24 hours 03/03/18 1507     03/03/18 1600  linezolid (ZYVOX) IVPB 600  mg     600 mg 300 mL/hr over 60 Minutes Intravenous Every 12 hours 03/03/18 1507     03/02/18 1800  ertapenem (INVANZ) 1,000 mg in sodium chloride 0.9 % 100 mL IVPB  Status:  Discontinued     1 g 200 mL/hr over 30 Minutes Intravenous Every 24 hours 03/02/18 0829 03/03/18 0959   02/28/18 1800  ertapenem (INVANZ) 500 mg in sodium chloride 0.9 % 50 mL IVPB  Status:  Discontinued     500 mg 100 mL/hr over 30 Minutes Intravenous Every 24 hours 02/28/18 1035 03/02/18 0829   02/27/18 1800  ertapenem (INVANZ) 1,000 mg in sodium chloride 0.9 % 100 mL IVPB  Status:  Discontinued     1 g 200 mL/hr over 30 Minutes Intravenous Every 24 hours 02/27/18 1705 02/28/18 1035   02/21/18 0800  ciprofloxacin (CIPRO) IVPB 400 mg  Status:  Discontinued     400 mg 200 mL/hr over 60 Minutes Intravenous Every 12 hours 02/20/18 2204 02/27/18 1908   02/21/18 0500  metroNIDAZOLE (FLAGYL) IVPB 500 mg  Status:  Discontinued     500 mg 100 mL/hr over 60 Minutes Intravenous Every 8 hours 02/20/18 2204 02/27/18 1908   02/20/18 2115  ciprofloxacin (CIPRO) IVPB 400 mg     400 mg 200  mL/hr over 60 Minutes Intravenous  Once 02/20/18 2113 02/20/18 2335   02/20/18 2115  metroNIDAZOLE (FLAGYL) tablet 500 mg     500 mg Oral  Once 02/20/18 2113 02/20/18 2147      Assessment/Plan: s/p Procedure(s): PARTIAL COLECTOMY WITH COLOSTOMY Impression: Postop wound dehiscence secondary to sepsis.  It does not appear infected at this point.   Plan: We will go back to the operating room tomorrow for oratory laparotomy and closure of abdominal wound.  The risks and benefits of the procedure were fully explained to the patient, who gave informed consent.  A wound VAC will probably be reapplied.  LOS: 17 days    [name] 03/09/2018

## 2018-03-09 NOTE — Progress Notes (Signed)
9 Days Post-Op  Subjective: Patient alert and oriented.  Denies any abdominal pain.  Objective: Vital signs in last 24 hours: Temp:  [97.3 F (36.3 C)-98.8 F (37.1 C)] 98.2 F (36.8 C) (02/23 0439) Pulse Rate:  [66-76] 66 (02/23 0439) Resp:  [15-20] 20 (02/23 0439) BP: (116-131)/(41-49) 131/41 (02/23 0439) SpO2:  [94 %-98 %] 96 % (02/23 0732) Weight:  [108.9 kg] 108.9 kg (02/23 0439) Last BM Date: 03/08/18  Intake/Output from previous day: 02/22 0701 - 02/23 0700 In: 370 [P.O.:360; I.V.:10] Out: 2650 [Urine:2650] Intake/Output this shift: Total I/O In: 240 [P.O.:240] Out: 900 [Urine:900]  General appearance: alert, cooperative and no distress GI: Wound VAC in place.  Soft.  Colostomy pink and patent.  Lab Results:  Recent Labs    03/07/18 0526 03/08/18 0611  WBC 15.7* 18.8*  HGB 9.0* 9.4*  HCT 29.1* 30.2*  PLT 297 305   BMET Recent Labs    03/08/18 0611 03/09/18 0610  NA 140 139  K 3.5 3.5  CL 99 99  CO2 31 32  GLUCOSE 131* 120*  BUN 37* 34*  CREATININE 1.21* 1.24*  CALCIUM 8.3* 8.3*   PT/INR No results for input(s): LABPROT, INR in the last 72 hours.  Studies/Results: No results found.  Anti-infectives: Anti-infectives (From admission, onward)   Start     Dose/Rate Route Frequency Ordered Stop   03/06/18 1300  fluconazole (DIFLUCAN) IVPB 200 mg     200 mg 100 mL/hr over 60 Minutes Intravenous Every 24 hours 03/05/18 1216     03/05/18 1300  fluconazole (DIFLUCAN) IVPB 400 mg     400 mg 100 mL/hr over 120 Minutes Intravenous  Once 03/05/18 1216 03/05/18 1523   03/03/18 1700  meropenem (MERREM) 1 g in sodium chloride 0.9 % 100 mL IVPB  Status:  Discontinued     1 g 200 mL/hr over 30 Minutes Intravenous Every 12 hours 03/03/18 1000 03/03/18 1507   03/03/18 1700  ertapenem (INVANZ) 1,000 mg in sodium chloride 0.9 % 100 mL IVPB     1 g 200 mL/hr over 30 Minutes Intravenous Every 24 hours 03/03/18 1507     03/03/18 1600  linezolid (ZYVOX) IVPB 600  mg     600 mg 300 mL/hr over 60 Minutes Intravenous Every 12 hours 03/03/18 1507     03/02/18 1800  ertapenem (INVANZ) 1,000 mg in sodium chloride 0.9 % 100 mL IVPB  Status:  Discontinued     1 g 200 mL/hr over 30 Minutes Intravenous Every 24 hours 03/02/18 0829 03/03/18 0959   02/28/18 1800  ertapenem (INVANZ) 500 mg in sodium chloride 0.9 % 50 mL IVPB  Status:  Discontinued     500 mg 100 mL/hr over 30 Minutes Intravenous Every 24 hours 02/28/18 1035 03/02/18 0829   02/27/18 1800  ertapenem (INVANZ) 1,000 mg in sodium chloride 0.9 % 100 mL IVPB  Status:  Discontinued     1 g 200 mL/hr over 30 Minutes Intravenous Every 24 hours 02/27/18 1705 02/28/18 1035   02/21/18 0800  ciprofloxacin (CIPRO) IVPB 400 mg  Status:  Discontinued     400 mg 200 mL/hr over 60 Minutes Intravenous Every 12 hours 02/20/18 2204 02/27/18 1908   02/21/18 0500  metroNIDAZOLE (FLAGYL) IVPB 500 mg  Status:  Discontinued     500 mg 100 mL/hr over 60 Minutes Intravenous Every 8 hours 02/20/18 2204 02/27/18 1908   02/20/18 2115  ciprofloxacin (CIPRO) IVPB 400 mg     400 mg 200  mL/hr over 60 Minutes Intravenous  Once 02/20/18 2113 02/20/18 2335   02/20/18 2115  metroNIDAZOLE (FLAGYL) tablet 500 mg     500 mg Oral  Once 02/20/18 2113 02/20/18 2147      Assessment/Plan: s/p Procedure(s): PARTIAL COLECTOMY WITH COLOSTOMY Impression: Postop wound dehiscence secondary to sepsis.  It does not appear infected at this point.   Plan: We will go back to the operating room tomorrow for oratory laparotomy and closure of abdominal wound.  The risks and benefits of the procedure were fully explained to the patient, who gave informed consent.  A wound VAC will probably be reapplied.  LOS: 17 days    [name] 03/09/2018

## 2018-03-09 NOTE — Progress Notes (Signed)
PROGRESS NOTE  Regina Hall RWE:[id number] DOB: Mar 11, 1934 DOA: 02/20/2018 PCP: [name], DO   Brief History: 84 year old female with hypertension, history of left leg DVT status post IVC filter presented to the hospital with abdominal pain. CT of the abdomen pelvis shows diverticulitis with large amount of stool throughout the colon and concern for underlying colonic obstruction. Underwent sigmoidoscopy showing obstruction in the sigmoid colon with marked edema. GI and surgery following. The patient subsequently developed a new fever. Blood and urine cultures were ordered. Repeat CT abd/pelvis ordered 2/13showed free air. Case was discussed with general surgery and plans to go to OR 2/14. Pt underwent a Hartman's procedure on 02/28/18. She was transferred to ICU on ventilator post-op.  Post-op, she was hypotensive and started on neosynephrine. She also developed new onset atrial fibrillation. She was started on amiodarone IVon 02/28/18. She converted to sinus after ~5 hours. She was kept on amiodarone due to continued critical illness and high risk of reverting back to afib.  She developed fluid overload due to fluid resuscitation and was started on furosemide  Assessment/Plan: Large bowel (sigmoid) obstruction -Underwent sigmoidoscopy showing obstruction in the sigmoid colon with marked edema. Surgery following and recommend initially non-operative tx  -2/7--Underwent sigmoidoscopy showing obstruction in the sigmoid colon with marked edema. -GI recommends outpatient colonoscopy in 6 weeks for full evaluation of the colon. -Sigmoidoscopy biopsy--tubular adenoma -02/27/18 CT abd--New small amount of free intraperitoneal air, consistent with bowel perforation -2/14--Hartmann procedure -Positive biopsy for serous adenocarcinoma.  Will follow further recommendation by general surgery. -Continue advancing diet.  Acute diverticulitiswith  perforation/peritonitis -cipro/flagyl d/ced -ertapenem started 2/13 -02/27/18 CT abd--New small amount of free intraperitoneal air, consistent with bowel perforation -2/14--Hartmann procedure -slowly advancing diet. -2/16--weaned off neosynephrine; stable VS off pressors.  -continue ertapenem, linezolid and antifungal regimen. -2/18--WBC up today likely due dehiscence of the abd wound and and concerns for peritonitis.     -will revisit to OR and exploration/re-closure of abd wound on 2/24.  New Onset Atrial Fibrillation -started amiodarone drip due to hypotension 02/28/18 -converted back to sinus after 4-5 hours on amio -continue amiodarone 200 mg bid by mouth, with intention to remain on amiodarone for 1 month and follow-up with cardiology service as an outpatient. -case discussed with Dr. Harl Bowie  Acute on chronic renal failure--CKD stage 3 -baseline creatinine 0.9-1.2 -nonoliguric -consult nephrology--appreciated recommendations and inputs. -due to infectious process and hemodynamic changes -remains fluid overloaded; continue lasix IV, continue weaning slowly. -Follow-up renal function trend -Cr 1.24 currently.   Acute respiratory failure with hypoxia -due to hypoventilation, fluidoverload, bronchospasm -appreciate pulmonary service inputs -echo--EF 60-65%, mild LAE -Continue weaning oxygen supplementation -Start decreasing IV diuretics.  ??Hematemesis -RN reported multiple episodes of rust/red/brown emesis -consult GI--noted input -continuePPI bid -no further bleeding appreciated.  Bronchospasm/wheezing -continueas needed Xopenex/atrovent -continuepulmicort -2/16--repeat CXR--personally reviewed--mild improvement in interstitial markings, poor inspiration. -Steroids has been discontinued.  Chronic bilateral lower extremity edema -Suspect secondary to venous insufficiency/hypoalbuminemia, fluid overload -Improved and stable. -Will continue decreasing IV  Lasix; now 40 BID.  Essential hypertension -Continue holding losartan in the setting of hypotension and acute kidney injury. -Blood pressure stable. -Continue to monitor  Physical deconditioning -PT eval-->SNF -Reports ambulating with a walker due to scoliosis prior to hospitalization.    Disposition Plan:Patient has tolerated well transition of amiodarone drip.  Continue IV diuresis (weaning down dose).  Follow general surgery recommendations.  Afebrile.  Abdominal wound got open, WBCs trending  up; planning for re-exploratory laparotomy, peritoneal wash and reclosure of abdominal wound on 03/10/2018.Marland Kitchen    Family Communication:Sister and brother-in-law at bedside.  Consultants:General surgery, GI, renal  Code Status: FULL   DVT Prophylaxis: Joy Lovenox   Procedures: As Listed in Progress Note Above  Antibiotics: cipro2/7>>>2/13 Flagyl2/7>>>2/13 Ertapenem 2/13>>>> linezolid 2/17>>> Fluconazole 2/19>>>   Subjective: Afebrile, cooperative and in no major distress.  Patient with 1 L of oxygen supplementation in place.  Denying shortness of breath and chest pain.  No nausea, no vomiting, tolerating soft diet.  Complaining of lower back pain.  Objective: Vitals:   03/08/18 1926 03/08/18 2031 03/09/18 0439 03/09/18 0732  BP:  (!) 116/49 (!) 131/41   Pulse:  76 66   Resp:  15 20   Temp:  98.8 F (37.1 C) 98.2 F (36.8 C)   TempSrc:  Oral Oral   SpO2: 97% 94% 97% 96%  Weight:   108.9 kg   Height:        Intake/Output Summary (Last 24 hours) at 03/09/2018 1030 Last data filed at 03/09/2018 0854 Gross per 24 hour  Intake 370 ml  Output 2600 ml  Net -2230 ml   Weight change:    Exam: General exam: Alert, awake, oriented x 3; no fever, no chest pain, no nausea, no vomiting.  Patient denies major respiratory distress.  Expressed having some lower back pain.  Tolerating soft diet.  Still reporting significant weakness and deconditioning. Respiratory  system: Good air movement bilaterally, no wheezing, no crackles, positive scattered rhonchi.  No using accessory muscles. Cardiovascular system:Rate controlled and currently sinus.  No rubs, no gallops, no murmurs. Gastrointestinal system: Abdomen is soft, colostomy bag in place with fecal material inside.  Wound VAC appreciated in mid abdominal incision.  Positive bowel sounds.  Central nervous system: Alert and oriented. No focal neurological deficits. Extremities: No cyanosis or clubbing.  Trace edema bilaterally. Skin: No rashes, petechiae.  Mid abdominal wound with wound VAC in place as mentioned above. Psychiatry: Judgement and insight appear normal. Mood & affect appropriate.    Data Reviewed: I have personally reviewed following labs and imaging studies  Basic Metabolic Panel: Recent Labs  Lab 03/05/18 0404 03/06/18 0415 03/07/18 0526 03/08/18 0611 03/09/18 0610  NA 139 139 141 140 139  K 3.3* 3.2* 3.5 3.5 3.5  CL 101 102 103 99 99  CO2 28 28 31 31  32  GLUCOSE 210* 184* 120* 131* 120*  BUN 56* 48* 43* 37* 34*  CREATININE 1.39* 1.38* 1.31* 1.21* 1.24*  CALCIUM 8.4* 8.0* 8.2* 8.3* 8.3*  MG  --   --  1.8  --   --   PHOS 4.1 3.1 3.3 3.1 3.2   Liver Function Tests: Recent Labs  Lab 03/03/18 0445 03/04/18 0618 03/05/18 0404 03/06/18 0415 03/07/18 0526 03/08/18 0611 03/09/18 0610  AST 11* 11*  --   --   --   --   --   ALT 9 8  --   --   --   --   --   ALKPHOS 33* 33*  --   --   --   --   --   BILITOT 0.3 0.6  --   --   --   --   --   PROT 4.3* 5.6*  --   --   --   --   --   ALBUMIN 1.8* 3.3*  3.2* 2.4* 2.0* 2.0* 2.0* 2.0*   CBC: Recent Labs  Lab 03/04/18  2683 03/05/18 0404 03/06/18 0415 03/07/18 0526 03/08/18 0611  WBC 20.3* 19.2* 17.0* 15.7* 18.8*  HGB 9.5* 8.6* 8.5* 9.0* 9.4*  HCT 29.6* 27.0* 26.7* 29.1* 30.2*  MCV 91.6 92.5 93.0 94.8 95.0  PLT 279 275 278 297 305   CBG: Recent Labs  Lab 03/08/18 1620 03/08/18 1944 03/08/18 2343 03/09/18 0432  03/09/18 0726  GLUCAP 150* 133* 178* 118* 124*   Urine analysis:    Component Value Date/Time   COLORURINE YELLOW 02/26/2018 [place] 02/26/2018 1712   LABSPEC 1.014 02/26/2018 1712   PHURINE 8.0 02/26/2018 1712   GLUCOSEU NEGATIVE 02/26/2018 1712   HGBUR SMALL (A) 02/26/2018 1712   BILIRUBINUR NEGATIVE 02/26/2018 1712   KETONESUR NEGATIVE 02/26/2018 1712   PROTEINUR NEGATIVE 02/26/2018 1712   UROBILINOGEN 0.2 04/21/2014 1200   NITRITE NEGATIVE 02/26/2018 1712   LEUKOCYTESUR NEGATIVE 02/26/2018 1712    Recent Results (from the past 240 hour(s))  Surgical PCR screen     Status: None   Collection Time: 02/28/18  7:52 AM  Result Value Ref Range Status   MRSA, PCR NEGATIVE NEGATIVE Final   Staphylococcus aureus NEGATIVE NEGATIVE Final    Comment: (NOTE) The Xpert SA Assay (FDA approved for NASAL specimens in patients 22 years of age and older), is one component of a comprehensive surveillance program. It is not intended to diagnose infection nor to guide or monitor treatment. Performed at [facility], [address], [place], [place] [address]   Aerobic/Anaerobic Culture (surgical/deep wound)     Status: None   Collection Time: 02/28/18  2:27 PM  Result Value Ref Range Status   Specimen Description   Final    WOUND Performed at [facility], [address], [place], [place] [address]    Special Requests   Final    ABSCESS Performed at [facility], [address], [place], [place] [address]    Gram Stain   Final    RARE WBC PRESENT, PREDOMINANTLY PMN RARE GRAM POSITIVE COCCI RARE GRAM VARIABLE ROD RARE GRAM POSITIVE RODS    Culture   Final    FEW VANCOMYCIN RESISTANT ENTEROCOCCUS NO ANAEROBES ISOLATED Performed at [facility], [address], [place], [place] [address]    Report Status 03/05/2018 FINAL  Final   Organism ID, Bacteria VANCOMYCIN RESISTANT ENTEROCOCCUS  Final      Susceptibility   Vancomycin resistant enterococcus - MIC*     AMPICILLIN >=32 RESISTANT Resistant     VANCOMYCIN >=32 RESISTANT Resistant     GENTAMICIN SYNERGY SENSITIVE Sensitive     LINEZOLID 2 SENSITIVE Sensitive     * FEW VANCOMYCIN RESISTANT ENTEROCOCCUS     Scheduled Meds: . amiodarone  200 mg Oral BID  . budesonide (PULMICORT) nebulizer solution  0.5 mg Nebulization BID  . Chlorhexidine Gluconate Cloth  6 each Topical Daily  . enoxaparin (LOVENOX) injection  40 mg Subcutaneous Q24H  . feeding supplement (ENSURE ENLIVE)  237 mL Oral BID BM  . furosemide  40 mg Intravenous BID  . ipratropium  0.5 mg Nebulization TID  . levalbuterol  1.25 mg Nebulization TID  . pantoprazole  40 mg Oral Q1200  . potassium chloride  40 mEq Oral Daily  . sodium chloride flush  10-40 mL Intracatheter Q12H   Continuous Infusions: . sodium chloride Stopped (03/05/18 1323)  . ertapenem 1,000 mg (03/08/18 1658)  . fluconazole (DIFLUCAN) IV 200 mg (03/08/18 1336)  . linezolid (ZYVOX) IV Stopped (03/09/18 0701)  Procedures/Studies: Ct Abdomen Pelvis Wo Contrast  Result Date: 02/27/2018 CLINICAL DATA:  Follow-up diverticulitis. Chronic abdominal pain. EXAM: CT ABDOMEN AND PELVIS WITHOUT CONTRAST TECHNIQUE: Multidetector CT imaging of the abdomen and pelvis was performed following the standard protocol without IV contrast. COMPARISON:  02/20/2018 FINDINGS: Lower chest: New small bilateral pleural effusions and bibasilar atelectasis. Hepatobiliary: Stable small cyst in left hepatic lobe. No mass visualized on this unenhanced exam. Tiny gallstones again noted, however there is no evidence of cholecystitis or biliary ductal dilatation. Pancreas: No mass or inflammatory process visualized on this unenhanced exam. Spleen:  Within normal limits in size. Adrenals/Urinary tract: No evidence of urolithiasis or hydronephrosis. Unremarkable unopacified urinary bladder. Stomach/Bowel: New small amount of free intraperitoneal air is seen, consistent with bowel perforation. Mild  ascites is new since previous study, as well as diffuse mesenteric and body wall edema. Large amount of stool is again seen throughout the colon. A transition point is again seen near the rectosigmoid junction, consistent with stricture. No definite soft tissue mass appreciated by CT. Mild diverticulosis and wall thickening is seen involving the proximal sigmoid, which is similar to previous study and suspicious for mild diverticulitis. No evidence of small bowel wall thickening or obstruction. No abscess identified. Vascular/Lymphatic: No pathologically enlarged lymph nodes identified. No evidence of abdominal aortic aneurysm. Reproductive: Prior hysterectomy. Limited visualization due to artifact from left hip prosthesis. Other:  None. Musculoskeletal:  No suspicious bone lesions identified. IMPRESSION: 1. New small amount of free intraperitoneal air, consistent with bowel perforation. 2. Large colonic stool burden with transition point near the rectosigmoid junction, consistent with stricture. No definite soft tissue mass appreciated by CT. This is unchanged in appearance compared to previous study. Recommend correlation with colonoscopy to exclude obstructing colon carcinoma. 3. Mild diverticulitis of proximal sigmoid colon, without significant change. 4. New mild ascites and diffuse body wall edema. New small bilateral pleural effusions and bibasilar atelectasis. 5. Cholelithiasis. No radiographic evidence of cholecystitis. Critical Value/emergent results were called by telephone at the time of interpretation on 02/27/2018 at 4:35 pm to Dr. Shanon Brow TAT , who verbally acknowledged these results. Electronically Signed   By: Earle Gell M.D.   On: 02/27/2018 16:38   Ct Abdomen Pelvis W Contrast  Result Date: 02/20/2018 CLINICAL DATA:  Lower abdominal pain and constipation for the past 2 days. Clinical suspicion for diverticulitis. EXAM: CT ABDOMEN AND PELVIS WITH CONTRAST TECHNIQUE: Multidetector CT imaging of the  abdomen and pelvis was performed using the standard protocol following bolus administration of intravenous contrast. CONTRAST:  121mL OMNIPAQUE IOHEXOL 300 MG/ML  SOLN COMPARISON:  Abdomen radiographs dated 02/06/2018. Abdomen and pelvis CT dated 04/08/2015. FINDINGS: Lower chest: Unremarkable. Hepatobiliary: No significant change in previously demonstrated liver cysts. Small number of tiny gallstones in the gallbladder measuring up to 4 mm in maximum diameter each. No gallbladder wall thickening or pericholecystic fluid. Pancreas: Unremarkable. No pancreatic ductal dilatation or surrounding inflammatory changes. Spleen: 5 mm oval area of low density in the spleen on image number 29 series 2, difficult to detect with certainty on the previous examination, partly due to differences in technique and timing. Adrenals/Urinary Tract: Normal appearing adrenal glands. Stable tiny exophytic upper pole left renal cyst. Normal appearing right kidney, ureters and urinary bladder. Stomach/Bowel: Large amount of stool throughout the colon to the level of the distal sigmoid colon where there is a short segment of concentric, medium density wall thickening, best seen on image number 72 series 2. The wall thickening is concentric and  lower in density on coronal image number 90. Lesser amount of stool and gas in the normal caliber rectum. Minimal sigmoid colon diverticulosis. Mild pericolonic soft tissue stranding involving the distal sigmoid colon, most pronounced at the level of maximal distension of the colon by the stool. Normal appearing stomach, small bowel and appendix. Vascular/Lymphatic: Atheromatous arterial calcifications without aneurysm. No enlarged lymph nodes. Inferior vena cava filter with its tip just above the level of the renal veins. Reproductive: Status post hysterectomy. No adnexal masses. Other: Small amount of free peritoneal fluid in the pelvis. Musculoskeletal: Left hip prosthesis. Severe right hip  degenerative changes with severe joint space narrowing, extensive subarticular cyst formation, spur formation and bony remodeling. Moderate levoconvex thoracolumbar scoliosis. Interbody and pedicle screw and rod fusion at the L4-5 level. Fusion of portions of the T12 and L1 vertebral bodies. Lumbar and lower thoracic spine degenerative changes. IMPRESSION: 1. Large amount of stool throughout the colon to the level of the distal sigmoid colon where there is a short segment of concentric, medium density wall thickening. This is concerning for an obstructing short segment mass. It would be unusual for edema due to diverticulitis to involve that short of a segment of bowel. Correlation with sigmoidoscopy or colonoscopy is recommended. 2. Mild pericolonic soft tissue stranding involving the distal sigmoid colon, most pronounced at the level of maximal distension of the colon by the stool. This is suspicious for mild diverticulitis without abscess. There is minimal diverticulosis in that region of the colon. 3. Cholelithiasis. 4. 5 mm probable hemangioma in the spleen. Electronically Signed   By: Claudie Revering M.D.   On: 02/20/2018 20:59   Dg Chest Port 1 View  Result Date: 03/04/2018 CLINICAL DATA:  Pulmonary edema. EXAM: PORTABLE CHEST 1 VIEW COMPARISON:  03/03/2018 FINDINGS: 0538 hours. Endotracheal tube tip is 5.1 cm above the base of the carina. NG tube passes into the stomach. Right PICC line tip overlies the distal SVC level. Lung volumes are low. The cardio pericardial silhouette is enlarged. There is pulmonary vascular congestion without overt pulmonary edema. Basilar atelectasis noted bilaterally with tiny bilateral pleural effusions. IMPRESSION: Low lung volumes with cardiomegaly, vascular congestion and tiny effusions. Electronically Signed   By: Misty Stanley M.D.   On: 03/04/2018 08:39   Dg Chest Port 1 View  Result Date: 03/03/2018 CLINICAL DATA:  Pulmonary edema. EXAM: PORTABLE CHEST 1 VIEW  COMPARISON:  Radiograph of March 02, 2018. FINDINGS: Stable cardiomediastinal silhouette. Atherosclerosis of thoracic aorta is noted. Endotracheal and nasogastric tubes are unchanged in position. Right-sided PICC line is unchanged in position. Stable hypoinflation of the lungs is noted with associated bibasilar atelectasis and small pleural effusions. No pneumothorax is noted. Severe degenerative changes seen involving the left glenohumeral joint. IMPRESSION: Stable support apparatus. Stable hypoinflation of the lungs with mild bibasilar subsegmental atelectasis and small pleural effusions. Aortic Atherosclerosis (ICD10-I70.0). Electronically Signed   By: Marijo Conception, M.D.   On: 03/03/2018 08:27   Dg Chest Port 1 View  Result Date: 03/02/2018 CLINICAL DATA:  Hypoxia EXAM: PORTABLE CHEST 1 VIEW COMPARISON:  March 01, 2018 FINDINGS: Endotracheal tube tip is 3.7 cm above the carina. Nasogastric tube tip and side port are in the stomach. Right subclavian catheter tip is at the cavoatrial junction. No pneumothorax. There is cardiomegaly with pulmonary venous hypertension. There are small pleural effusions bilaterally. There is atelectatic change in the lung bases. A degree of consolidation in the left base is questioned. There is advanced arthropathy in the  left shoulder. IMPRESSION: Tube and catheter positions as described without pneumothorax. Pulmonary vascular congestion present with small pleural effusions bilaterally. There is bibasilar atelectasis with a questionable degree of consolidation in the left base. Aortic Atherosclerosis (ICD10-I70.0). Electronically Signed   By: Lowella Grip III M.D.   On: 03/02/2018 07:38   Dg Chest Port 1 View  Result Date: 03/01/2018 CLINICAL DATA:  Ventilator dependent respiratory failure. Follow-up pulmonary edema. EXAM: PORTABLE CHEST 1 VIEW COMPARISON:  02/28/2018, 02/28/2016 and earlier. FINDINGS: Endotracheal tube tip in satisfactory position projecting  approximately 3-4 cm above the carina. RIGHT arm PICC tip projects at or near the cavoatrial junction, unchanged. Nasogastric tube looped in the stomach with its tip in the fundus. Markedly suboptimal inspiration with worsening atelectasis in the lung bases, LEFT greater than RIGHT. Resolution of interstitial pulmonary edema. Persistent pulmonary venous hypertension. IMPRESSION: 1.  Support apparatus satisfactory. 2. Resolution of interstitial pulmonary edema since yesterday, though pulmonary venous hypertension persists. 3. Worsening bibasilar atelectasis, LEFT greater than RIGHT. Electronically Signed   By: Evangeline Dakin M.D.   On: 03/01/2018 09:01   Dg Chest Port 1 View  Result Date: 02/28/2018 CLINICAL DATA:  Intubation. EXAM: PORTABLE CHEST 1 VIEW COMPARISON:  02/26/2018 FINDINGS: The endotracheal tube is 3.3 cm above the carina. There is an NG tube coursing down the esophagus and into the stomach. The right-sided PICC line is stable. Significant worsening lung aeration with very low lung volumes, vascular crowding, pulmonary edema and basilar atelectasis. The heart remains enlarged and there is tortuosity and calcification of the thoracic aorta. IMPRESSION: 1. The endotracheal tube and NG tubes are in good position. 2. Very low lung volumes with vascular crowding and atelectasis 3. Vascular congestion and pulmonary edema. Electronically Signed   By: Marijo Sanes M.D.   On: 02/28/2018 16:37   Dg Chest Port 1 View  Result Date: 02/26/2018 CLINICAL DATA:  84 year old female with intermittent productive cough EXAM: PORTABLE CHEST 1 VIEW COMPARISON:  02/28/2016 FINDINGS: Cardiomediastinal silhouette unchanged. Interval development of asymmetric elevation the right hemidiaphragm. No pneumothorax. Low lung volumes with coarsened interstitial markings and interlobular septal thickening. No large pleural effusion. No confluent airspace disease. Interval placement of right upper extremity PICC with the tip  appearing to terminate superior vena cava. Degenerative changes of the left glenohumeral joint. IMPRESSION: Low lung volumes with questionable edema. New asymmetric elevation the right hemidiaphragm, uncertain significance. Right upper extremity PICC. Electronically Signed   By: Corrie Mckusick D.O.   On: 02/26/2018 19:28   [place] Ekg Site Rite  Result Date: 02/22/2018 If Site Rite image not attached, placement could not be confirmed due to current cardiac rhythm.  Time: 25 minutes.   Barton Dubois, MD  [facility] Pager [phone] 03/09/2018, 10:30 AM   LOS: 17 days

## 2018-03-10 ENCOUNTER — Encounter (HOSPITAL_COMMUNITY): Payer: Self-pay | Admitting: Emergency Medicine

## 2018-03-10 ENCOUNTER — Inpatient Hospital Stay (HOSPITAL_COMMUNITY): Payer: Medicare Other | Admitting: Anesthesiology

## 2018-03-10 ENCOUNTER — Encounter (HOSPITAL_COMMUNITY)
Admission: EM | Disposition: A | Discharge status: Skilled Nursing Facility | Payer: Self-pay | Source: Home / Self Care | Attending: Internal Medicine

## 2018-03-10 ENCOUNTER — Other Ambulatory Visit: Payer: Self-pay

## 2018-03-10 DIAGNOSIS — T8131XA Disruption of external operation (surgical) wound, not elsewhere classified, initial encounter: Secondary | ICD-10-CM

## 2018-03-10 HISTORY — PX: APPLICATION OF WOUND VAC: SHX5189

## 2018-03-10 HISTORY — PX: SECONDARY CLOSURE OF WOUND: SHX6208

## 2018-03-10 HISTORY — PX: LAPAROTOMY: SHX154

## 2018-03-10 LAB — CBC
HCT: 27.9 % — ABNORMAL LOW (ref 36.0–46.0)
Hemoglobin: 8.7 g/dL — ABNORMAL LOW (ref 12.0–15.0)
MCH: 30.1 pg (ref 26.0–34.0)
MCHC: 31.2 g/dL (ref 30.0–36.0)
MCV: 96.5 fL (ref 80.0–100.0)
Platelets: 263 10*3/uL (ref 150–400)
RBC: 2.89 MIL/uL — ABNORMAL LOW (ref 3.87–5.11)
RDW: 14.7 % (ref 11.5–15.5)
WBC: 13.5 10*3/uL — ABNORMAL HIGH (ref 4.0–10.5)
nRBC: 0 % (ref 0.0–0.2)

## 2018-03-10 LAB — COMPREHENSIVE METABOLIC PANEL
ALT: 9 U/L (ref 0–44)
ANION GAP: 8 (ref 5–15)
AST: 15 U/L (ref 15–41)
Albumin: 2 g/dL — ABNORMAL LOW (ref 3.5–5.0)
Alkaline Phosphatase: 41 U/L (ref 38–126)
BUN: 32 mg/dL — ABNORMAL HIGH (ref 8–23)
CO2: 33 mmol/L — ABNORMAL HIGH (ref 22–32)
Calcium: 8.4 mg/dL — ABNORMAL LOW (ref 8.9–10.3)
Chloride: 97 mmol/L — ABNORMAL LOW (ref 98–111)
Creatinine, Ser: 1.25 mg/dL — ABNORMAL HIGH (ref 0.44–1.00)
GFR calc Af Amer: 47 mL/min — ABNORMAL LOW (ref >60)
GFR calc non Af Amer: 40 mL/min — ABNORMAL LOW (ref >60)
Glucose, Bld: 115 mg/dL — ABNORMAL HIGH (ref 70–99)
Potassium: 3.8 mmol/L (ref 3.5–5.1)
Sodium: 138 mmol/L (ref 135–145)
TOTAL PROTEIN: 4.3 g/dL — AB (ref 6.5–8.1)
Total Bilirubin: 0.7 mg/dL (ref 0.3–1.2)

## 2018-03-10 LAB — GLUCOSE, CAPILLARY
GLUCOSE-CAPILLARY: 95 mg/dL (ref 70–99)
Glucose-Capillary: 107 mg/dL — ABNORMAL HIGH (ref 70–99)
Glucose-Capillary: 114 mg/dL — ABNORMAL HIGH (ref 70–99)
Glucose-Capillary: 124 mg/dL — ABNORMAL HIGH (ref 70–99)
Glucose-Capillary: 137 mg/dL — ABNORMAL HIGH (ref 70–99)

## 2018-03-10 SURGERY — LAPAROTOMY, EXPLORATORY
Anesthesia: General

## 2018-03-10 MED ORDER — PROPOFOL 10 MG/ML IV BOLUS
INTRAVENOUS | Status: DC | PRN
  Administered 2018-03-10: 110 mg via INTRAVENOUS

## 2018-03-10 MED ORDER — LIDOCAINE HCL 1 % IJ SOLN
INTRAMUSCULAR | Status: DC | PRN
  Administered 2018-03-10: 100 mg via INTRADERMAL

## 2018-03-10 MED ORDER — HYDROCODONE-ACETAMINOPHEN 7.5-325 MG PO TABS: 1.0000 | ORAL_TABLET | Freq: Once | ORAL | Status: DC | PRN

## 2018-03-10 MED ORDER — MIDAZOLAM HCL 2 MG/2ML IJ SOLN: 0.5000 mg | Freq: Once | INTRAMUSCULAR | Status: DC | PRN

## 2018-03-10 MED ORDER — PROPOFOL 10 MG/ML IV BOLUS
INTRAVENOUS | Status: AC
  Filled 2018-03-10: qty 20

## 2018-03-10 MED ORDER — SODIUM CHLORIDE 0.9 % IR SOLN
Status: DC | PRN
  Administered 2018-03-10 (×2): 1000 mL
  Administered 2018-03-10: 3000 mL

## 2018-03-10 MED ORDER — MEPERIDINE HCL 50 MG/ML IJ SOLN: 6.2500 mg | INTRAMUSCULAR | Status: DC | PRN

## 2018-03-10 MED ORDER — SUCCINYLCHOLINE CHLORIDE 20 MG/ML IJ SOLN
INTRAMUSCULAR | Status: DC | PRN
  Administered 2018-03-10: 80 mg via INTRAVENOUS

## 2018-03-10 MED ORDER — SUGAMMADEX SODIUM 200 MG/2ML IV SOLN
INTRAVENOUS | Status: AC
  Filled 2018-03-10: qty 2

## 2018-03-10 MED ORDER — ONDANSETRON HCL 4 MG/2ML IJ SOLN
INTRAMUSCULAR | Status: DC | PRN
  Administered 2018-03-10: 4 mg via INTRAVENOUS

## 2018-03-10 MED ORDER — SUGAMMADEX SODIUM 200 MG/2ML IV SOLN
INTRAVENOUS | Status: DC | PRN
  Administered 2018-03-10: 200 mg via INTRAVENOUS

## 2018-03-10 MED ORDER — ROCURONIUM BROMIDE 100 MG/10ML IV SOLN
INTRAVENOUS | Status: DC | PRN
  Administered 2018-03-10: 30 mg via INTRAVENOUS

## 2018-03-10 MED ORDER — PROMETHAZINE HCL 25 MG/ML IJ SOLN: 6.2500 mg | INTRAMUSCULAR | Status: DC | PRN

## 2018-03-10 MED ORDER — FENTANYL CITRATE (PF) 100 MCG/2ML IJ SOLN
INTRAMUSCULAR | Status: DC | PRN
  Administered 2018-03-10 (×2): 25 ug via INTRAVENOUS
  Administered 2018-03-10: 50 ug via INTRAVENOUS

## 2018-03-10 MED ORDER — BUPIVACAINE LIPOSOME 1.3 % IJ SUSP
INTRAMUSCULAR | Status: AC
  Filled 2018-03-10: qty 20

## 2018-03-10 MED ORDER — LACTATED RINGERS IV SOLN
INTRAVENOUS | Status: DC
  Administered 2018-03-10: 12:00:00 via INTRAVENOUS

## 2018-03-10 MED ORDER — HYDROMORPHONE HCL 1 MG/ML IJ SOLN
0.2500 mg | INTRAMUSCULAR | Status: DC | PRN
  Administered 2018-03-10 (×3): 0.5 mg via INTRAVENOUS
  Filled 2018-03-10: qty 0.5

## 2018-03-10 MED ORDER — ONDANSETRON HCL 4 MG/2ML IJ SOLN: 4.0000 mg | Freq: Once | INTRAMUSCULAR | Status: DC | PRN

## 2018-03-10 MED ORDER — HYDROMORPHONE HCL 1 MG/ML IJ SOLN
INTRAMUSCULAR | Status: AC
  Filled 2018-03-10: qty 1

## 2018-03-10 MED ORDER — CHLORHEXIDINE GLUCONATE CLOTH 2 % EX PADS: 6.0000 | MEDICATED_PAD | Freq: Once | CUTANEOUS | Status: DC

## 2018-03-10 MED ORDER — ROCURONIUM BROMIDE 10 MG/ML (PF) SYRINGE
PREFILLED_SYRINGE | INTRAVENOUS | Status: AC
  Filled 2018-03-10: qty 10

## 2018-03-10 MED ORDER — POVIDONE-IODINE 10 % EX OINT
TOPICAL_OINTMENT | CUTANEOUS | Status: AC
  Filled 2018-03-10: qty 1

## 2018-03-10 MED ORDER — PHENYLEPHRINE HCL 10 MG/ML IJ SOLN
INTRAMUSCULAR | Status: DC | PRN
  Administered 2018-03-10: 20 ug via INTRAVENOUS
  Administered 2018-03-10: 60 ug via INTRAVENOUS
  Administered 2018-03-10: 40 ug via INTRAVENOUS

## 2018-03-10 MED ORDER — FENTANYL CITRATE (PF) 100 MCG/2ML IJ SOLN
INTRAMUSCULAR | Status: AC
  Filled 2018-03-10: qty 2

## 2018-03-10 MED ORDER — SUCCINYLCHOLINE CHLORIDE 200 MG/10ML IV SOSY
PREFILLED_SYRINGE | INTRAVENOUS | Status: AC
  Filled 2018-03-10: qty 20

## 2018-03-10 MED ORDER — LIDOCAINE 2% (20 MG/ML) 5 ML SYRINGE
INTRAMUSCULAR | Status: AC
  Filled 2018-03-10: qty 5

## 2018-03-10 MED ORDER — SUCCINYLCHOLINE CHLORIDE 200 MG/10ML IV SOSY
PREFILLED_SYRINGE | INTRAVENOUS | Status: AC
  Filled 2018-03-10: qty 10

## 2018-03-10 SURGICAL SUPPLY — 29 items
CANISTER WOUND CARE 500ML ATS (WOUND CARE) ×2 IMPLANT
CELLS DAT CNTRL 66122 CELL SVR (MISCELLANEOUS) ×2 IMPLANT
CLOTH BEACON ORANGE TIMEOUT ST (SAFETY) ×4 IMPLANT
COVER LIGHT HANDLE STERIS (MISCELLANEOUS) ×8 IMPLANT
DRSG VAC ATS MED SENSATRAC (GAUZE/BANDAGES/DRESSINGS) ×2 IMPLANT
ELECT REM PT RETURN 9FT ADLT (ELECTROSURGICAL) ×4
ELECTRODE REM PT RTRN 9FT ADLT (ELECTROSURGICAL) ×2 IMPLANT
GLOVE BIO SURGEON STRL SZ7 (GLOVE) ×4 IMPLANT
GLOVE BIOGEL PI IND STRL 7.0 (GLOVE) ×4 IMPLANT
GLOVE BIOGEL PI INDICATOR 7.0 (GLOVE) ×8
GLOVE ECLIPSE 8.0 STRL XLNG CF (GLOVE) ×2 IMPLANT
GLOVE SURG SS PI 7.5 STRL IVOR (GLOVE) ×4 IMPLANT
GOWN STRL REUS W/TWL LRG LVL3 (GOWN DISPOSABLE) ×14 IMPLANT
HANDLE SUCTION POOLE (INSTRUMENTS) IMPLANT
INST SET MAJOR GENERAL (KITS) ×4 IMPLANT
KIT REMOVER STAPLE SKIN (MISCELLANEOUS) ×2 IMPLANT
KIT TURNOVER KIT A (KITS) ×4 IMPLANT
MANIFOLD NEPTUNE II (INSTRUMENTS) ×4 IMPLANT
MESH VICRYL KNITTED 12X12 (Mesh General) ×2 IMPLANT
NS IRRIG 1000ML POUR BTL (IV SOLUTION) ×14 IMPLANT
PACK ABDOMINAL MAJOR (CUSTOM PROCEDURE TRAY) ×4 IMPLANT
PAD ARMBOARD 7.5X6 YLW CONV (MISCELLANEOUS) ×4 IMPLANT
RETRACTOR WND ALEXIS 18 MED (MISCELLANEOUS) ×2 IMPLANT
RTRCTR WOUND ALEXIS 18CM MED (MISCELLANEOUS) ×4
SET BASIN LINEN APH (SET/KITS/TRAYS/PACK) ×4 IMPLANT
SPONGE LAP 18X18 RF (DISPOSABLE) ×4 IMPLANT
SUCTION POOLE HANDLE (INSTRUMENTS) ×4
SYR 20CC LL (SYRINGE) ×2 IMPLANT
TACKER 5MM HERNIA 3.5CML NAB (ENDOMECHANICALS) ×4 IMPLANT

## 2018-03-10 NOTE — Interval H&P Note (Signed)
History and Physical Interval Note:  03/10/2018 12:04 PM  Regina Hall  has presented today for surgery, with the diagnosis of wound dehiscence  The various methods of treatment have been discussed with the patient and family. After consideration of risks, benefits and other options for treatment, the patient has consented to  Procedure(s): EXPLORATORY LAPAROTOMY (N/A) as a surgical intervention .  The patient's history has been reviewed, patient examined, no change in status, stable for surgery.  I have reviewed the patient's chart and labs.  Questions were answered to the patient's satisfaction.     [name]

## 2018-03-10 NOTE — Anesthesia Preprocedure Evaluation (Signed)
Anesthesia Evaluation  Patient identified by MRN, date of birth, ID band Patient awake    Reviewed: Allergy & Precautions, H&P , NPO status , Patient's Chart, lab work & pertinent test results  Airway Mallampati: II  TM Distance: >3 FB Neck ROM: Full    Dental no notable dental hx. (+) Teeth Intact, Poor Dentition Upper right loose :   Pulmonary pneumonia,    Pulmonary exam normal breath sounds clear to auscultation + decreased breath sounds      Cardiovascular Exercise Tolerance: Poor hypertension, Pt. on medications negative cardio ROS Normal cardiovascular examII Rhythm:Regular Rate:Normal     Neuro/Psych Anxiety negative neurological ROS  negative psych ROS   GI/Hepatic Neg liver ROS, GERD  Medicated and Poorly Controlled,Here in Hospital > 30 days  Appears weak -now with perforation -to OR D/W Surgeon probable post op ventilation   Endo/Other  negative endocrine ROS  Renal/GU Renal InsufficiencyRenal disease  negative genitourinary   Musculoskeletal  (+) Arthritis , Osteoarthritis,    Abdominal   Peds negative pediatric ROS (+)  Hematology negative hematology ROS (+) Blood dyscrasia, anemia ,   Anesthesia Other Findings DVT (deep venous thrombosis) with VENA CAVA FILTER PLACEMENT  Reproductive/Obstetrics negative OB ROS                             Anesthesia Physical  Anesthesia Plan  ASA: IV  Anesthesia Plan: General   Post-op Pain Management:    Induction: Intravenous  PONV Risk Score and Plan:   Airway Management Planned: Oral ETT  Additional Equipment:   Intra-op Plan:   Post-operative Plan: Extubation in OR  Informed Consent: I have reviewed the patients History and Physical, chart, labs and discussed the procedure including the risks, benefits and alternatives for the proposed anesthesia with the patient or authorized representative who has indicated his/her  understanding and acceptance.     Dental advisory given  Plan Discussed with: CRNA  Anesthesia Plan Comments: (D/W pt and Surgeon probable post op ventilation planned )        Anesthesia Quick Evaluation

## 2018-03-10 NOTE — Progress Notes (Signed)
PROGRESS NOTE  Regina Hall URK:[id number] DOB: 22-Feb-1934 DOA: 02/20/2018 PCP: [name], DO   Brief History: 84 year old female with hypertension, history of left leg DVT status post IVC filter presented to the hospital with abdominal pain. CT of the abdomen pelvis shows diverticulitis with large amount of stool throughout the colon and concern for underlying colonic obstruction. Underwent sigmoidoscopy showing obstruction in the sigmoid colon with marked edema. GI and surgery following. The patient subsequently developed a new fever. Blood and urine cultures were ordered. Repeat CT abd/pelvis ordered 2/13showed free air. Case was discussed with general surgery and plans to go to OR 2/14. Pt underwent a Hartman's procedure on 02/28/18. She was transferred to ICU on ventilator post-op.  Post-op, she was hypotensive and started on neosynephrine. She also developed new onset atrial fibrillation. She was started on amiodarone IVon 02/28/18. She converted to sinus after ~5 hours. She was kept on amiodarone due to continued critical illness and high risk of reverting back to afib.  She developed fluid overload due to fluid resuscitation and was started on furosemide  Assessment/Plan: Large bowel (sigmoid) obstruction -Underwent sigmoidoscopy showing obstruction in the sigmoid colon with marked edema. Surgery following and recommend initially non-operative tx  -2/7--Underwent sigmoidoscopy showing obstruction in the sigmoid colon with marked edema. -GI recommends outpatient colonoscopy in 6 weeks for full evaluation of the colon. -Sigmoidoscopy biopsy--tubular adenoma -02/27/18 CT abd--New small amount of free intraperitoneal air, consistent with bowel perforation -2/14--Hartmann procedure -Positive biopsy for serous adenocarcinoma.  Will follow further recommendation by general surgery. -Currently NPO in anticipation to OR visit; follow diet advance instructions  by general sx.  Acute diverticulitiswith perforation/peritonitis -cipro/flagyl d/ced -ertapenem started 2/13 -02/27/18 CT abd--New small amount of free intraperitoneal air, consistent with bowel perforation -2/14--Hartmann procedure -slowly advancing diet. -2/16--weaned off neosynephrine; stable VS off pressors.  -continue ertapenem, linezolid and antifungal regimen. -2/18--WBC up today likely due dehiscence of the abd wound and and concerns for peritonitis.     -will revisit to OR and exploration/re-closure of abd wound on 2/24.  New Onset Atrial Fibrillation -started amiodarone drip due to hypotension 02/28/18 -converted back to sinus after 4-5 hours on amio -continue amiodarone 200 mg bid by mouth, with intention to remain on amiodarone for 1 month and follow-up with cardiology service as an outpatient. -case discussed with Dr. Harl Bowie  Acute on chronic renal failure--CKD stage 3 -baseline creatinine 0.9-1.2 -nonoliguric -consult nephrology--appreciated recommendations and inputs. -due to infectious process and hemodynamic changes -remains fluid overloaded; continue lasix IV, continue weaning slowly. -Follow-up renal function trend -Cr 1.24 currently.   Acute respiratory failure with hypoxia -due to hypoventilation, fluidoverload, bronchospasm -appreciate pulmonary service inputs -echo--EF 60-65%, mild LAE -Continue weaning oxygen supplementation -Start decreasing IV diuretics.  ??Hematemesis -RN reported multiple episodes of rust/red/brown emesis -consult GI--noted input -continuePPI bid -no further bleeding appreciated.  Bronchospasm/wheezing -continueas needed Xopenex/atrovent -continuepulmicort -2/16--repeat CXR--personally reviewed--mild improvement in interstitial markings, poor inspiration. -Steroids has been discontinued.  Chronic bilateral lower extremity edema -Suspect secondary to venous insufficiency/hypoalbuminemia, fluid overload -Improved  and stable. -Will continue decreasing IV Lasix; now 40 BID.  Essential hypertension -Continue holding losartan in the setting of hypotension and acute kidney injury. -Blood pressure stable. -Continue to monitor  Physical deconditioning -PT eval-->SNF -Reports ambulating with a walker due to scoliosis prior to hospitalization.    Disposition Plan:Patient has tolerated well transition of amiodarone drip.  Continue IV diuresis (weaning down dose).  Follow general  surgery recommendations.  Afebrile. Planning for re-exploratory laparotomy, peritoneal wash and reclosure of abdominal wound later today 03/10/2018.Marland Kitchen    Family Communication:Sister and brother-in-law at bedside.  Consultants:General surgery, GI, renal  Code Status: FULL   DVT Prophylaxis: Staples Lovenox   Procedures: As Listed in Progress Note Above  Antibiotics: cipro2/7>>>2/13 Flagyl2/7>>>2/13 Ertapenem 2/13>>>> linezolid 2/17>>> Fluconazole 2/19>>>   Subjective: Wearing 1 L of oxygen supplementation, no fever, in no major distress.  Denies chest pain, nausea and vomiting.   Objective: Vitals:   03/10/18 1500 03/10/18 1515 03/10/18 1520 03/10/18 1533  BP: (!) 105/43 (!) 87/69 (!) 95/53 (!) 99/42  Pulse: 64 65 67 69  Resp: 14 13 (!) 22 14  Temp:    98.7 F (37.1 C)  TempSrc:      SpO2: 98% 100% 100% 100%  Weight:      Height:        Intake/Output Summary (Last 24 hours) at 03/10/2018 1629 Last data filed at 03/10/2018 1352 Gross per 24 hour  Intake 660 ml  Output 1815 ml  Net -1155 ml   Weight change: 0.9 kg   Exam: General exam: Alert, awake, oriented x 3, afebrile, denies chest pain, no nausea, no fever.  Slightly nervous due to anticipated visit to the OR. Respiratory system: Good air movement bilaterally, no wheezing, no crackles. Cardiovascular system:Rate controlled.  No rubs, no gallops, no murmurs.. Gastrointestinal system: Abdomen is soft, wound VAC in place.  Colostomy bag  with some stools inside.  No guarding.  Positive bowel sounds appreciated. Central nervous system: Alert and oriented. No focal neurological deficits. Extremities: No cyanosis or clubbing. Skin: No rashes, no petechiae.  Wound VAC appreciated in mid abdominal incision and colostomy bag appreciated on left lower quadrant. Psychiatry: Judgement and insight appear normal. Mood & affect appropriate.    Data Reviewed: I have personally reviewed following labs and imaging studies  Basic Metabolic Panel: Recent Labs  Lab 03/05/18 0404 03/06/18 0415 03/07/18 0526 03/08/18 0611 03/09/18 0610 03/10/18 0434  NA 139 139 141 140 139 138  K 3.3* 3.2* 3.5 3.5 3.5 3.8  CL 101 102 103 99 99 97*  CO2 28 28 31 31  32 33*  GLUCOSE 210* 184* 120* 131* 120* 115*  BUN 56* 48* 43* 37* 34* 32*  CREATININE 1.39* 1.38* 1.31* 1.21* 1.24* 1.25*  CALCIUM 8.4* 8.0* 8.2* 8.3* 8.3* 8.4*  MG  --   --  1.8  --   --   --   PHOS 4.1 3.1 3.3 3.1 3.2  --    Liver Function Tests: Recent Labs  Lab 03/04/18 0618  03/06/18 0415 03/07/18 0526 03/08/18 0611 03/09/18 0610 03/10/18 0434  AST 11*  --   --   --   --   --  15  ALT 8  --   --   --   --   --  9  ALKPHOS 33*  --   --   --   --   --  41  BILITOT 0.6  --   --   --   --   --  0.7  PROT 5.6*  --   --   --   --   --  4.3*  ALBUMIN 3.3*  3.2*   < > 2.0* 2.0* 2.0* 2.0* 2.0*   < > = values in this interval not displayed.   CBC: Recent Labs  Lab 03/05/18 0404 03/06/18 0415 03/07/18 0526 03/08/18 0611 03/10/18 0434  WBC 19.2* 17.0*  15.7* 18.8* 13.5*  HGB 8.6* 8.5* 9.0* 9.4* 8.7*  HCT 27.0* 26.7* 29.1* 30.2* 27.9*  MCV 92.5 93.0 94.8 95.0 96.5  PLT 275 278 297 305 263   CBG: Recent Labs  Lab 03/09/18 2049 03/10/18 0026 03/10/18 0416 03/10/18 0718 03/10/18 1621  GLUCAP 147* 137* 114* 124* 95   Urine analysis:    Component Value Date/Time   COLORURINE YELLOW 02/26/2018 [place] 02/26/2018 1712   LABSPEC 1.014 02/26/2018  1712   PHURINE 8.0 02/26/2018 1712   GLUCOSEU NEGATIVE 02/26/2018 1712   HGBUR SMALL (A) 02/26/2018 1712   BILIRUBINUR NEGATIVE 02/26/2018 1712   KETONESUR NEGATIVE 02/26/2018 1712   PROTEINUR NEGATIVE 02/26/2018 1712   UROBILINOGEN 0.2 04/21/2014 1200   NITRITE NEGATIVE 02/26/2018 1712   LEUKOCYTESUR NEGATIVE 02/26/2018 1712    No results found for this or any previous visit (from the past 240 hour(s)).   Scheduled Meds: . amiodarone  200 mg Oral BID  . budesonide (PULMICORT) nebulizer solution  0.5 mg Nebulization BID  . Chlorhexidine Gluconate Cloth  6 each Topical Daily  . enoxaparin (LOVENOX) injection  40 mg Subcutaneous Q24H  . feeding supplement (ENSURE ENLIVE)  237 mL Oral BID BM  . furosemide  40 mg Intravenous BID  . ipratropium  0.5 mg Nebulization TID  . levalbuterol  1.25 mg Nebulization TID  . pantoprazole  40 mg Oral Q1200  . potassium chloride  40 mEq Oral Daily  . sodium chloride flush  10-40 mL Intracatheter Q12H   Continuous Infusions: . sodium chloride Stopped (03/05/18 1323)  . ertapenem 1,000 mg (03/09/18 1903)  . fluconazole (DIFLUCAN) IV 200 mg (03/09/18 1615)  . linezolid (ZYVOX) IV Stopped (03/10/18 1230)    Procedures/Studies: Ct Abdomen Pelvis Wo Contrast  Result Date: 02/27/2018 CLINICAL DATA:  Follow-up diverticulitis. Chronic abdominal pain. EXAM: CT ABDOMEN AND PELVIS WITHOUT CONTRAST TECHNIQUE: Multidetector CT imaging of the abdomen and pelvis was performed following the standard protocol without IV contrast. COMPARISON:  02/20/2018 FINDINGS: Lower chest: New small bilateral pleural effusions and bibasilar atelectasis. Hepatobiliary: Stable small cyst in left hepatic lobe. No mass visualized on this unenhanced exam. Tiny gallstones again noted, however there is no evidence of cholecystitis or biliary ductal dilatation. Pancreas: No mass or inflammatory process visualized on this unenhanced exam. Spleen:  Within normal limits in size.  Adrenals/Urinary tract: No evidence of urolithiasis or hydronephrosis. Unremarkable unopacified urinary bladder. Stomach/Bowel: New small amount of free intraperitoneal air is seen, consistent with bowel perforation. Mild ascites is new since previous study, as well as diffuse mesenteric and body wall edema. Large amount of stool is again seen throughout the colon. A transition point is again seen near the rectosigmoid junction, consistent with stricture. No definite soft tissue mass appreciated by CT. Mild diverticulosis and wall thickening is seen involving the proximal sigmoid, which is similar to previous study and suspicious for mild diverticulitis. No evidence of small bowel wall thickening or obstruction. No abscess identified. Vascular/Lymphatic: No pathologically enlarged lymph nodes identified. No evidence of abdominal aortic aneurysm. Reproductive: Prior hysterectomy. Limited visualization due to artifact from left hip prosthesis. Other:  None. Musculoskeletal:  No suspicious bone lesions identified. IMPRESSION: 1. New small amount of free intraperitoneal air, consistent with bowel perforation. 2. Large colonic stool burden with transition point near the rectosigmoid junction, consistent with stricture. No definite soft tissue mass appreciated by CT. This is unchanged in appearance compared to previous study. Recommend correlation with colonoscopy to exclude obstructing colon carcinoma.  3. Mild diverticulitis of proximal sigmoid colon, without significant change. 4. New mild ascites and diffuse body wall edema. New small bilateral pleural effusions and bibasilar atelectasis. 5. Cholelithiasis. No radiographic evidence of cholecystitis. Critical Value/emergent results were called by telephone at the time of interpretation on 02/27/2018 at 4:35 pm to Dr. Shanon Brow TAT , who verbally acknowledged these results. Electronically Signed   By: Earle Gell M.D.   On: 02/27/2018 16:38   Ct Abdomen Pelvis W  Contrast  Result Date: 02/20/2018 CLINICAL DATA:  Lower abdominal pain and constipation for the past 2 days. Clinical suspicion for diverticulitis. EXAM: CT ABDOMEN AND PELVIS WITH CONTRAST TECHNIQUE: Multidetector CT imaging of the abdomen and pelvis was performed using the standard protocol following bolus administration of intravenous contrast. CONTRAST:  126mL OMNIPAQUE IOHEXOL 300 MG/ML  SOLN COMPARISON:  Abdomen radiographs dated 02/06/2018. Abdomen and pelvis CT dated 04/08/2015. FINDINGS: Lower chest: Unremarkable. Hepatobiliary: No significant change in previously demonstrated liver cysts. Small number of tiny gallstones in the gallbladder measuring up to 4 mm in maximum diameter each. No gallbladder wall thickening or pericholecystic fluid. Pancreas: Unremarkable. No pancreatic ductal dilatation or surrounding inflammatory changes. Spleen: 5 mm oval area of low density in the spleen on image number 29 series 2, difficult to detect with certainty on the previous examination, partly due to differences in technique and timing. Adrenals/Urinary Tract: Normal appearing adrenal glands. Stable tiny exophytic upper pole left renal cyst. Normal appearing right kidney, ureters and urinary bladder. Stomach/Bowel: Large amount of stool throughout the colon to the level of the distal sigmoid colon where there is a short segment of concentric, medium density wall thickening, best seen on image number 72 series 2. The wall thickening is concentric and lower in density on coronal image number 90. Lesser amount of stool and gas in the normal caliber rectum. Minimal sigmoid colon diverticulosis. Mild pericolonic soft tissue stranding involving the distal sigmoid colon, most pronounced at the level of maximal distension of the colon by the stool. Normal appearing stomach, small bowel and appendix. Vascular/Lymphatic: Atheromatous arterial calcifications without aneurysm. No enlarged lymph nodes. Inferior vena cava filter  with its tip just above the level of the renal veins. Reproductive: Status post hysterectomy. No adnexal masses. Other: Small amount of free peritoneal fluid in the pelvis. Musculoskeletal: Left hip prosthesis. Severe right hip degenerative changes with severe joint space narrowing, extensive subarticular cyst formation, spur formation and bony remodeling. Moderate levoconvex thoracolumbar scoliosis. Interbody and pedicle screw and rod fusion at the L4-5 level. Fusion of portions of the T12 and L1 vertebral bodies. Lumbar and lower thoracic spine degenerative changes. IMPRESSION: 1. Large amount of stool throughout the colon to the level of the distal sigmoid colon where there is a short segment of concentric, medium density wall thickening. This is concerning for an obstructing short segment mass. It would be unusual for edema due to diverticulitis to involve that short of a segment of bowel. Correlation with sigmoidoscopy or colonoscopy is recommended. 2. Mild pericolonic soft tissue stranding involving the distal sigmoid colon, most pronounced at the level of maximal distension of the colon by the stool. This is suspicious for mild diverticulitis without abscess. There is minimal diverticulosis in that region of the colon. 3. Cholelithiasis. 4. 5 mm probable hemangioma in the spleen. Electronically Signed   By: Claudie Revering M.D.   On: 02/20/2018 20:59   Dg Chest Port 1 View  Result Date: 03/04/2018 CLINICAL DATA:  Pulmonary edema. EXAM: PORTABLE CHEST 1  VIEW COMPARISON:  03/03/2018 FINDINGS: 0538 hours. Endotracheal tube tip is 5.1 cm above the base of the carina. NG tube passes into the stomach. Right PICC line tip overlies the distal SVC level. Lung volumes are low. The cardio pericardial silhouette is enlarged. There is pulmonary vascular congestion without overt pulmonary edema. Basilar atelectasis noted bilaterally with tiny bilateral pleural effusions. IMPRESSION: Low lung volumes with cardiomegaly,  vascular congestion and tiny effusions. Electronically Signed   By: Misty Stanley M.D.   On: 03/04/2018 08:39   Dg Chest Port 1 View  Result Date: 03/03/2018 CLINICAL DATA:  Pulmonary edema. EXAM: PORTABLE CHEST 1 VIEW COMPARISON:  Radiograph of March 02, 2018. FINDINGS: Stable cardiomediastinal silhouette. Atherosclerosis of thoracic aorta is noted. Endotracheal and nasogastric tubes are unchanged in position. Right-sided PICC line is unchanged in position. Stable hypoinflation of the lungs is noted with associated bibasilar atelectasis and small pleural effusions. No pneumothorax is noted. Severe degenerative changes seen involving the left glenohumeral joint. IMPRESSION: Stable support apparatus. Stable hypoinflation of the lungs with mild bibasilar subsegmental atelectasis and small pleural effusions. Aortic Atherosclerosis (ICD10-I70.0). Electronically Signed   By: Marijo Conception, M.D.   On: 03/03/2018 08:27   Dg Chest Port 1 View  Result Date: 03/02/2018 CLINICAL DATA:  Hypoxia EXAM: PORTABLE CHEST 1 VIEW COMPARISON:  March 01, 2018 FINDINGS: Endotracheal tube tip is 3.7 cm above the carina. Nasogastric tube tip and side port are in the stomach. Right subclavian catheter tip is at the cavoatrial junction. No pneumothorax. There is cardiomegaly with pulmonary venous hypertension. There are small pleural effusions bilaterally. There is atelectatic change in the lung bases. A degree of consolidation in the left base is questioned. There is advanced arthropathy in the left shoulder. IMPRESSION: Tube and catheter positions as described without pneumothorax. Pulmonary vascular congestion present with small pleural effusions bilaterally. There is bibasilar atelectasis with a questionable degree of consolidation in the left base. Aortic Atherosclerosis (ICD10-I70.0). Electronically Signed   By: Lowella Grip III M.D.   On: 03/02/2018 07:38   Dg Chest Port 1 View  Result Date: 03/01/2018 CLINICAL  DATA:  Ventilator dependent respiratory failure. Follow-up pulmonary edema. EXAM: PORTABLE CHEST 1 VIEW COMPARISON:  02/28/2018, 02/28/2016 and earlier. FINDINGS: Endotracheal tube tip in satisfactory position projecting approximately 3-4 cm above the carina. RIGHT arm PICC tip projects at or near the cavoatrial junction, unchanged. Nasogastric tube looped in the stomach with its tip in the fundus. Markedly suboptimal inspiration with worsening atelectasis in the lung bases, LEFT greater than RIGHT. Resolution of interstitial pulmonary edema. Persistent pulmonary venous hypertension. IMPRESSION: 1.  Support apparatus satisfactory. 2. Resolution of interstitial pulmonary edema since yesterday, though pulmonary venous hypertension persists. 3. Worsening bibasilar atelectasis, LEFT greater than RIGHT. Electronically Signed   By: Evangeline Dakin M.D.   On: 03/01/2018 09:01   Dg Chest Port 1 View  Result Date: 02/28/2018 CLINICAL DATA:  Intubation. EXAM: PORTABLE CHEST 1 VIEW COMPARISON:  02/26/2018 FINDINGS: The endotracheal tube is 3.3 cm above the carina. There is an NG tube coursing down the esophagus and into the stomach. The right-sided PICC line is stable. Significant worsening lung aeration with very low lung volumes, vascular crowding, pulmonary edema and basilar atelectasis. The heart remains enlarged and there is tortuosity and calcification of the thoracic aorta. IMPRESSION: 1. The endotracheal tube and NG tubes are in good position. 2. Very low lung volumes with vascular crowding and atelectasis 3. Vascular congestion and pulmonary edema. Electronically Signed   By:  Marijo Sanes M.D.   On: 02/28/2018 16:37   Dg Chest Port 1 View  Result Date: 02/26/2018 CLINICAL DATA:  84 year old female with intermittent productive cough EXAM: PORTABLE CHEST 1 VIEW COMPARISON:  02/28/2016 FINDINGS: Cardiomediastinal silhouette unchanged. Interval development of asymmetric elevation the right hemidiaphragm. No  pneumothorax. Low lung volumes with coarsened interstitial markings and interlobular septal thickening. No large pleural effusion. No confluent airspace disease. Interval placement of right upper extremity PICC with the tip appearing to terminate superior vena cava. Degenerative changes of the left glenohumeral joint. IMPRESSION: Low lung volumes with questionable edema. New asymmetric elevation the right hemidiaphragm, uncertain significance. Right upper extremity PICC. Electronically Signed   By: Corrie Mckusick D.O.   On: 02/26/2018 19:28   [place] Ekg Site Rite  Result Date: 02/22/2018 If Site Rite image not attached, placement could not be confirmed due to current cardiac rhythm.  Time: 25 minutes.   Barton Dubois, MD  [facility] Pager [phone] 03/10/2018, 4:29 PM   LOS: 18 days

## 2018-03-10 NOTE — Anesthesia Postprocedure Evaluation (Signed)
Anesthesia Post Note  Patient: Regina Hall  Procedure(s) Performed: EXPLORATORY LAPAROTOMY (N/A ) SECONDARY CLOSURE OF WOUND APPLICATION OF WOUND VAC  Patient location during evaluation: PACU Anesthesia Type: General Level of consciousness: awake, patient cooperative and sedated Pain management: satisfactory to patient Vital Signs Assessment: post-procedure vital signs reviewed and stable Respiratory status: spontaneous breathing and non-rebreather facemask Cardiovascular status: stable Postop Assessment: no apparent nausea or vomiting Anesthetic complications: no     Last Vitals:  Vitals:   03/10/18 1052 03/10/18 1426  BP: (!) 129/51 (!) 110/59  Pulse: 67   Resp: 19 18  Temp: 36.8 C 36.8 C  SpO2: 96% 100%    Last Pain:  Vitals:   03/10/18 1426  TempSrc:   PainSc: 5                  Mashelle Busick

## 2018-03-10 NOTE — Care Management Important Message (Signed)
Important Message  Patient Details  Name: LLUVIA GWYNNE MRN: [id number] Date of Birth: 1934/12/11   Medicare Important Message Given:  Yes    Joanthan Hlavacek, Chauncey Reading, RN 03/10/2018, 2:04 PM

## 2018-03-10 NOTE — Progress Notes (Addendum)
Pharmacy Antibiotic Note  Regina Hall is a 84 y.o. female admitted on 02/20/2018 with intra-abdominal infection.  Pharmacy has been consulted for diflucan dosing.   Plan: Continue fluconazole 200mg  IV daily F/U clinical progress and completion of antibiotic/antifungal therapy  Height: 5\' 6"  (167.6 cm) Weight: 242 lb 1 oz (109.8 kg) IBW/kg (Calculated) : 59.3  Temp (24hrs), Avg:98.4 F (36.9 C), Min:98.2 F (36.8 C), Max:98.7 F (37.1 C)  Recent Labs  Lab 03/05/18 0404 03/06/18 0415 03/07/18 0526 03/08/18 0611 03/09/18 0610 03/10/18 0434  WBC 19.2* 17.0* 15.7* 18.8*  --  13.5*  CREATININE 1.39* 1.38* 1.31* 1.21* 1.24* 1.25*    Estimated Creatinine Clearance: 44.3 mL/min (A) (by C-G formula based on SCr of 1.25 mg/dL (H)).    Allergies  Allergen Reactions  . Penicillins Rash and Other (See Comments)    Has patient had a PCN reaction causing immediate rash, facial/tongue/throat swelling, SOB or lightheadedness with hypotension: ###Yes## Has patient had a PCN reaction causing severe rash involving mucus membranes or skin necrosis: No Has patient had a PCN reaction that required hospitalization No Has patient had a PCN reaction occurring within the last 10 years: No If all of the above answers are "NO", then may proceed with Cephalosporin use.   . Vancomycin     Itching, SOB     Antimicrobials this admission: Ertapenem 2/16 >>  Linezolid 2/17 >> Diflucan 2/19>>   Dose adjustments this admission:  Microbiology results: 2/14 Wound cx:  E. Faecium s- linezolid R-vancomycin  Thank you for allowing pharmacy to be a part of this patient's care.  Margot Ables, PharmD Clinical Pharmacist 03/10/2018 9:57 PM   03/10/2018 9:56 PM

## 2018-03-10 NOTE — Consult Note (Signed)
WOC follow-up: Surgical team following for assessment and plan of care for abd wound.  Pt plans to go to the OR today for Vac change, according to the progress notes.  [place] team will continue to follow post-op for ostomy teaching and Vac dressing changes if desired; please provide further orders post-op. Julien Girt MSN, RN, [place], [place], [place]

## 2018-03-10 NOTE — Anesthesia Procedure Notes (Signed)
Procedure Name: Intubation Date/Time: 03/10/2018 1:11 PM Performed by: Charmaine Downs, CRNA Pre-anesthesia Checklist: Patient identified, Patient being monitored, Timeout performed, Emergency Drugs available and Suction available Patient Re-evaluated:Patient Re-evaluated prior to induction Oxygen Delivery Method: Circle System Utilized Preoxygenation: Pre-oxygenation with 100% oxygen Induction Type: IV induction Ventilation: Mask ventilation without difficulty Laryngoscope Size: Mac and 3 Grade View: Grade I Tube type: Oral Tube size: 7.0 mm Number of attempts: 1 Airway Equipment and Method: stylet Placement Confirmation: ETT inserted through vocal cords under direct vision,  positive ETCO2 and breath sounds checked- equal and bilateral Secured at: 22 cm Tube secured with: Tape Dental Injury: Teeth and Oropharynx as per pre-operative assessment

## 2018-03-10 NOTE — Interval H&P Note (Signed)
History and Physical Interval Note:  03/10/2018 12:03 PM  Regina Hall  has presented today for surgery, with the diagnosis of wound dehiscence  The various methods of treatment have been discussed with the patient and family. After consideration of risks, benefits and other options for treatment, the patient has consented to  Procedure(s): EXPLORATORY LAPAROTOMY (N/A) as a surgical intervention .  The patient's history has been reviewed, patient examined, no change in status, stable for surgery.  I have reviewed the patient's chart and labs.  Questions were answered to the patient's satisfaction.     [name]

## 2018-03-10 NOTE — Op Note (Signed)
Patient:  [name]  DOB:  December 17, 1934  MRN:  [id number]   Preop Diagnosis: Dehiscence of abdominal wound  Postop Diagnosis: Same  Procedure: Repeat exploratory laparotomy, closure of abdominal wound with Vicryl mesh and wound VAC application  Surgeon: [name], MD  Anes: General endotracheal  Indications: Patient is an 84 year old white female 9 days status post Hartman's procedure for fecal peritonitis due to rectal perforation who developed a fascial dehiscence.  The wound VAC was previously applied on the floor and now the patient comes to the operating room for an exploratory laparotomy with cleanout of her abdomen, closure of her abdominal wound.  The risks and benefits of the procedure were fully explained to the patient, who gave informed consent.  Procedure note: The patient was placed in the supine position.  After induction of general endotracheal anesthesia, the wound was prepped and draped using usual sterile technique with Betadine.  Surgical site confirmation was performed.  The left lower quadrant colostomy was isolated from the field.  The previously placed white wound VAC sponge was removed.  The bowel was moist and there was no evidence of injury to the small intestine.  It appeared that the previously placed looped 0 Novafil suture was intact but the fascia had been closed.  I proceeded to explore all 4 quadrants of the abdomen.  No feces was encountered.  Cloudy serous fluid was noted in the pelvis.  All 4 quadrants were copiously irrigated with normal saline.  There was no frank abscess cavity present.  A Vicryl mesh was then sized and tacked to the underside of the abdominal wall using a pro-tacker.  Care was taken to avoid any bowel injury.  The fascia was 2 necrotic to try to perform any closure.  A 6 x 9 cm black wound VAC sponge was then inserted.  It was attached to the wound VAC and no leak was noted.  The patient tolerated the procedure well.  She was  extubated in the operating room and transferred to PACU in stable condition.  Complications: None  EBL: 50 cc  Specimen: None

## 2018-03-10 NOTE — Transfer of Care (Signed)
Immediate Anesthesia Transfer of Care Note  Patient: Regina Hall  Procedure(s) Performed: EXPLORATORY LAPAROTOMY (N/A ) SECONDARY CLOSURE OF WOUND APPLICATION OF WOUND VAC  Patient Location: PACU  Anesthesia Type:General  Level of Consciousness: awake and patient cooperative  Airway & Oxygen Therapy: Patient Spontanous Breathing and Patient connected to face mask oxygen  Post-op Assessment: Report given to RN, Post -op Vital signs reviewed and stable and Patient moving all extremities  Post vital signs: Reviewed and stable  Last Vitals:  Vitals Value Taken Time  BP    Temp    Pulse    Resp    SpO2      Last Pain:  Vitals:   03/10/18 1052  TempSrc: Oral  PainSc: 6       Patients Stated Pain Goal: 2 (10/31/49 0258)  Complications: No apparent anesthesia complications

## 2018-03-11 ENCOUNTER — Encounter (HOSPITAL_COMMUNITY): Payer: Self-pay | Admitting: General Surgery

## 2018-03-11 LAB — GLUCOSE, CAPILLARY
GLUCOSE-CAPILLARY: 147 mg/dL — AB (ref 70–99)
GLUCOSE-CAPILLARY: 161 mg/dL — AB (ref 70–99)
Glucose-Capillary: 95 mg/dL (ref 70–99)
Glucose-Capillary: 111 mg/dL — ABNORMAL HIGH (ref 70–99)
Glucose-Capillary: 158 mg/dL — ABNORMAL HIGH (ref 70–99)

## 2018-03-11 MED ORDER — FUROSEMIDE 40 MG PO TABS
40.0000 mg | ORAL_TABLET | Freq: Every day | ORAL | Status: DC
  Administered 2018-03-11 – 2018-03-13 (×3): 40 mg via ORAL
  Filled 2018-03-11 (×3): qty 1

## 2018-03-11 MED ORDER — FLUCONAZOLE 100 MG PO TABS
200.0000 mg | ORAL_TABLET | Freq: Every day | ORAL | Status: DC
  Administered 2018-03-11 – 2018-03-13 (×3): 200 mg via ORAL
  Filled 2018-03-11 (×3): qty 2

## 2018-03-11 NOTE — Consult Note (Addendum)
WOC follow-up:  Abd Vac intact with good seal, mod amt pink drainage in the cannister.  Cont suction on at 183mm.  Colostomy pouch intact with good seal, mod amt liquid brown stool.  Discussed plan of care with surgical team at the bedside.  WOC will plan to change Vac dressing and ostomy pouch tomorrow. Julien Girt MSN, RN, [place], [place], [place]

## 2018-03-11 NOTE — Progress Notes (Signed)
1 Day Post-Op  Subjective: Patient alert and oriented.  Complaining of abdominal wall muscle pain.  She is hungry.  Objective: Vital signs in last 24 hours: Temp:  [98.2 F (36.8 C)-98.7 F (37.1 C)] 98.6 F (37 C) (02/25 0620) Pulse Rate:  [62-78] 69 (02/25 0620) Resp:  [10-24] 24 (02/25 0620) BP: (87-129)/(40-69) 107/43 (02/25 0620) SpO2:  [96 %-100 %] 98 % (02/25 0633) Weight:  [113.4 kg] 113.4 kg (02/25 0633) Last BM Date: 03/10/18  Intake/Output from previous day: 02/24 0701 - 02/25 0700 In: 530 [P.O.:120; I.V.:410] Out: 815 [Urine:800; Blood:15] Intake/Output this shift: No intake/output data recorded.  General appearance: alert, cooperative and no distress GI: Soft, wound VAC in place.  Ostomy pink and patent.  Lab Results:  Recent Labs    03/10/18 0434  WBC 13.5*  HGB 8.7*  HCT 27.9*  PLT 263   BMET Recent Labs    03/09/18 0610 03/10/18 0434  NA 139 138  K 3.5 3.8  CL 99 97*  CO2 32 33*  GLUCOSE 120* 115*  BUN 34* 32*  CREATININE 1.24* 1.25*  CALCIUM 8.3* 8.4*   PT/INR No results for input(s): LABPROT, INR in the last 72 hours.  Studies/Results: No results found.  Anti-infectives: Anti-infectives (From admission, onward)   Start     Dose/Rate Route Frequency Ordered Stop   03/06/18 1300  fluconazole (DIFLUCAN) IVPB 200 mg     200 mg 100 mL/hr over 60 Minutes Intravenous Every 24 hours 03/05/18 1216     03/05/18 1300  fluconazole (DIFLUCAN) IVPB 400 mg     400 mg 100 mL/hr over 120 Minutes Intravenous  Once 03/05/18 1216 03/05/18 1523   03/03/18 1700  meropenem (MERREM) 1 g in sodium chloride 0.9 % 100 mL IVPB  Status:  Discontinued     1 g 200 mL/hr over 30 Minutes Intravenous Every 12 hours 03/03/18 1000 03/03/18 1507   03/03/18 1700  ertapenem (INVANZ) 1,000 mg in sodium chloride 0.9 % 100 mL IVPB     1 g 200 mL/hr over 30 Minutes Intravenous Every 24 hours 03/03/18 1507     03/03/18 1600  linezolid (ZYVOX) IVPB 600 mg     600 mg 300  mL/hr over 60 Minutes Intravenous Every 12 hours 03/03/18 1507     03/02/18 1800  ertapenem (INVANZ) 1,000 mg in sodium chloride 0.9 % 100 mL IVPB  Status:  Discontinued     1 g 200 mL/hr over 30 Minutes Intravenous Every 24 hours 03/02/18 0829 03/03/18 0959   02/28/18 1800  ertapenem (INVANZ) 500 mg in sodium chloride 0.9 % 50 mL IVPB  Status:  Discontinued     500 mg 100 mL/hr over 30 Minutes Intravenous Every 24 hours 02/28/18 1035 03/02/18 0829   02/27/18 1800  ertapenem (INVANZ) 1,000 mg in sodium chloride 0.9 % 100 mL IVPB  Status:  Discontinued     1 g 200 mL/hr over 30 Minutes Intravenous Every 24 hours 02/27/18 1705 02/28/18 1035   02/21/18 0800  ciprofloxacin (CIPRO) IVPB 400 mg  Status:  Discontinued     400 mg 200 mL/hr over 60 Minutes Intravenous Every 12 hours 02/20/18 2204 02/27/18 1908   02/21/18 0500  metroNIDAZOLE (FLAGYL) IVPB 500 mg  Status:  Discontinued     500 mg 100 mL/hr over 60 Minutes Intravenous Every 8 hours 02/20/18 2204 02/27/18 1908   02/20/18 2115  ciprofloxacin (CIPRO) IVPB 400 mg     400 mg 200 mL/hr over 60 Minutes  Intravenous  Once 02/20/18 2113 02/20/18 2335   02/20/18 2115  metroNIDAZOLE (FLAGYL) tablet 500 mg     500 mg Oral  Once 02/20/18 2113 02/20/18 2147      Assessment/Plan: s/p Procedure(s): EXPLORATORY LAPAROTOMY SECONDARY CLOSURE OF WOUND APPLICATION OF WOUND VAC Impression: Doing well on postoperative day 1, status post reapplication of wound VAC and Vicryl mesh. Plan: Appreciate wound care assistance.  For wound VAC change tomorrow.  Patient will need skilled nursing for rehab.  LOS: 19 days    [name] 03/11/2018

## 2018-03-11 NOTE — Progress Notes (Signed)
PROGRESS NOTE  Regina Hall HLK:[id number] DOB: 04/02/1934 DOA: 02/20/2018 PCP: [name], DO   Brief History: 84 year old female with hypertension, history of left leg DVT status post IVC filter presented to the hospital with abdominal pain. CT of the abdomen pelvis shows diverticulitis with large amount of stool throughout the colon and concern for underlying colonic obstruction. Underwent sigmoidoscopy showing obstruction in the sigmoid colon with marked edema. GI and surgery following. The patient subsequently developed a new fever. Blood and urine cultures were ordered. Repeat CT abd/pelvis ordered 2/13showed free air. Case was discussed with general surgery and plans to go to OR 2/14. Pt underwent a Hartman's procedure on 02/28/18. She was transferred to ICU on ventilator post-op.  Post-op, she was hypotensive and started on neosynephrine. She also developed new onset atrial fibrillation. She was started on amiodarone IVon 02/28/18. She converted to sinus after ~5 hours. She was kept on amiodarone due to continued critical illness and high risk of reverting back to afib.  She developed fluid overload due to fluid resuscitation and was started on furosemide  Assessment/Plan: Large bowel (sigmoid) obstruction -Underwent sigmoidoscopy showing obstruction in the sigmoid colon with marked edema. Surgery following and recommend initially non-operative tx  -2/7--Underwent sigmoidoscopy showing obstruction in the sigmoid colon with marked edema. -GI recommends outpatient colonoscopy in 6 weeks for full evaluation of the colon. -Sigmoidoscopy biopsy--tubular adenoma -02/27/18 CT abd--New small amount of free intraperitoneal air, consistent with bowel perforation -2/14--Hartmann procedure -2/24 revisit to OR for exploration, peritoneal wash and reclosure of abd wound.  -Positive biopsy for serous adenocarcinoma.  Will follow further recommendation by general  surgery. -advance diet as per surgery rec's; tolerating full liquid.   Acute diverticulitiswith perforation/peritonitis -cipro/flagyl d/ced -ertapenem started 2/13 -02/27/18 CT abd--New small amount of free intraperitoneal air, consistent with bowel perforation -2/14--Hartmann procedure -slowly advancing diet. -2/16--weaned off neosynephrine; has remained stable and off pressors.  -continue ertapenem, linezolid and antifungal regimen; general surgery to determine length. -2/18--WBC went up likely due dehiscence of the abd wound and and concerns for underlying peritonitis.     -2/24 revisited OR for exploration/re-closure of abd wound. Well tolerated. After peritoneal wash WBC's 13.5 and no fever. Patient with Wound Vac in place. Follow general surgery and wound care service recommendations.  New Onset Atrial Fibrillation -started amiodarone drip due to hypotension 02/28/18 -converted back to sinus after 4-5 hours on amio -continue amiodarone 200 mg bid by mouth, with intention to remain on amiodarone for 1 month and follow-up with cardiology service as an outpatient. -case discussed with Dr. Harl Bowie  Acute on chronic renal failure--CKD stage 3 -baseline creatinine 0.9-1.2 -nonoliguric -consultation to nephrology--appreciated recommendations and inputs. -due to infectious process and hemodynamic changes -volume status significantly improved -continue oral lasix now.  -Follow-up renal function trend -Cr 1.24 currently.   Acute respiratory failure with hypoxia -due to hypoventilation, fluidoverload, bronchospasm -appreciate pulmonary service inputs and recommendations. -echo--EF 60-65%, mild LAE -Continue weaning oxygen supplementation as toelrated. -continue oral lasix and follow volume status.  ??Hematemesis -RN reported multiple episodes of rust/red/brown emesis -consult GI--noted input -continuePPI bid -no further bleeding  appreciated.  Bronchospasm/wheezing -continueas needed Xopenex/atrovent -continuepulmicort -no wheezing on exam.  Chronic bilateral lower extremity edema -Suspect secondary to venous insufficiency/hypoalbuminemia, fluid overload status has significantly Improved and is stable. -Will continue lasix daily, but transition to 40mg  daily.  Essential hypertension -Continue holding losartan in the setting of hypotension and acute kidney injury. -Blood  pressure stable. -Continue to monitor  Physical deconditioning -PT eval-->SNF for physical rehabilitation. -Reports ambulating with a walker due to scoliosis prior to hospitalization.    Disposition Plan:Patient has tolerated well transition of amiodarone drip.  Continue diuresis now transition to oral regimen.  Patient is status post second visit to the OR for peritoneal wash and reclosure of abdominal wound on 03/10/18. Well tolerated.  Family Communication:No family at bedside.  Consultants:General surgery, GI, renal  Code Status: FULL   DVT Prophylaxis: [place] Lovenox   Procedures: As Listed in Progress Note Above  Antibiotics: cipro2/7>>>2/13 Flagyl2/7>>>2/13 Ertapenem 2/13>>>> linezolid 2/17>>> Fluconazole 2/19>>>   Subjective: Afebrile, denying chest pain or shortness of breath.  Patient is wearing 1 L oxygen supplementation.  Reports abdominal to be sore and feeling hungry.  There is liquid stool in her colostomy bag.  No nausea, no vomiting.  Feeling very weak and significantly deconditioned.  Objective: Vitals:   03/10/18 1925 03/10/18 2214 03/11/18 0620 03/11/18 0633  BP:  (!) 124/47 (!) 107/43   Pulse:  78 69   Resp:  20 (!) 24   Temp:  98.7 F (37.1 C) 98.6 F (37 C)   TempSrc:  Oral Oral   SpO2: 98% 100% 98% 98%  Weight:    113.4 kg  Height:        Intake/Output Summary (Last 24 hours) at 03/11/2018 3614 Last data filed at 03/11/2018 4315 Gross per 24 hour  Intake 650 ml  Output  815 ml  Net -165 ml   Weight change: 3.6 kg   Exam: General exam: Alert, awake, oriented x 3; reports soreness in her abdomen, no nausea, no vomiting, feeling hungry.  Patient using 1 L nasal cannula supplementation and denies chest pain or shortness of breath. Respiratory system: Good air movement bilaterally, no wheezing, no crackles. Cardiovascular system:Rate controlled, currently in sinus rhythm; no rubs, no gallops, no murmurs or JVD appreciated on exam. Gastrointestinal system: Abdomen is nondistended, with positive bowel sounds, colostomy bag in place with liquid stool inside.  Mid abdominal incision with no active drainage and wound VAC in place.   Central nervous system: Alert and oriented. No focal neurological deficits. Extremities: No cyanosis or clubbing; no edema appreciated on exam. Skin: No rashes, no petechiae. Positive wound vac in mid abdomen and positive colostomy bag. Psychiatry: Judgement and insight appear normal. Mood & affect appropriate.   Data Reviewed: I have personally reviewed following labs and imaging studies  Basic Metabolic Panel: Recent Labs  Lab 03/05/18 0404 03/06/18 0415 03/07/18 0526 03/08/18 0611 03/09/18 0610 03/10/18 0434  NA 139 139 141 140 139 138  K 3.3* 3.2* 3.5 3.5 3.5 3.8  CL 101 102 103 99 99 97*  CO2 28 28 31 31  32 33*  GLUCOSE 210* 184* 120* 131* 120* 115*  BUN 56* 48* 43* 37* 34* 32*  CREATININE 1.39* 1.38* 1.31* 1.21* 1.24* 1.25*  CALCIUM 8.4* 8.0* 8.2* 8.3* 8.3* 8.4*  MG  --   --  1.8  --   --   --   PHOS 4.1 3.1 3.3 3.1 3.2  --    Liver Function Tests: Recent Labs  Lab 03/06/18 0415 03/07/18 0526 03/08/18 0611 03/09/18 0610 03/10/18 0434  AST  --   --   --   --  15  ALT  --   --   --   --  9  ALKPHOS  --   --   --   --  41  BILITOT  --   --   --   --  0.7  PROT  --   --   --   --  4.3*  ALBUMIN 2.0* 2.0* 2.0* 2.0* 2.0*   CBC: Recent Labs  Lab 03/05/18 0404 03/06/18 0415 03/07/18 0526 03/08/18 0611  03/10/18 0434  WBC 19.2* 17.0* 15.7* 18.8* 13.5*  HGB 8.6* 8.5* 9.0* 9.4* 8.7*  HCT 27.0* 26.7* 29.1* 30.2* 27.9*  MCV 92.5 93.0 94.8 95.0 96.5  PLT 275 278 297 305 263   CBG: Recent Labs  Lab 03/10/18 0718 03/10/18 1621 03/10/18 2228 03/11/18 0443 03/11/18 0817  GLUCAP 124* 95 107* 95 111*   Urine analysis:    Component Value Date/Time   COLORURINE YELLOW 02/26/2018 [place] 02/26/2018 1712   LABSPEC 1.014 02/26/2018 1712   PHURINE 8.0 02/26/2018 1712   GLUCOSEU NEGATIVE 02/26/2018 1712   HGBUR SMALL (A) 02/26/2018 1712   BILIRUBINUR NEGATIVE 02/26/2018 1712   KETONESUR NEGATIVE 02/26/2018 1712   PROTEINUR NEGATIVE 02/26/2018 1712   UROBILINOGEN 0.2 04/21/2014 1200   NITRITE NEGATIVE 02/26/2018 1712   LEUKOCYTESUR NEGATIVE 02/26/2018 1712    No results found for this or any previous visit (from the past 240 hour(s)).   Scheduled Meds: . amiodarone  200 mg Oral BID  . budesonide (PULMICORT) nebulizer solution  0.5 mg Nebulization BID  . Chlorhexidine Gluconate Cloth  6 each Topical Daily  . enoxaparin (LOVENOX) injection  40 mg Subcutaneous Q24H  . feeding supplement (ENSURE ENLIVE)  237 mL Oral BID BM  . furosemide  40 mg Intravenous BID  . ipratropium  0.5 mg Nebulization TID  . levalbuterol  1.25 mg Nebulization TID  . pantoprazole  40 mg Oral Q1200  . potassium chloride  40 mEq Oral Daily  . sodium chloride flush  10-40 mL Intracatheter Q12H   Continuous Infusions: . sodium chloride 10 mL/hr at 03/10/18 1634  . ertapenem 1,000 mg (03/10/18 1639)  . fluconazole (DIFLUCAN) IV 200 mg (03/09/18 1615)  . linezolid (ZYVOX) IV 600 mg (03/10/18 2322)    Procedures/Studies: Ct Abdomen Pelvis Wo Contrast  Result Date: 02/27/2018 CLINICAL DATA:  Follow-up diverticulitis. Chronic abdominal pain. EXAM: CT ABDOMEN AND PELVIS WITHOUT CONTRAST TECHNIQUE: Multidetector CT imaging of the abdomen and pelvis was performed following the standard protocol  without IV contrast. COMPARISON:  02/20/2018 FINDINGS: Lower chest: New small bilateral pleural effusions and bibasilar atelectasis. Hepatobiliary: Stable small cyst in left hepatic lobe. No mass visualized on this unenhanced exam. Tiny gallstones again noted, however there is no evidence of cholecystitis or biliary ductal dilatation. Pancreas: No mass or inflammatory process visualized on this unenhanced exam. Spleen:  Within normal limits in size. Adrenals/Urinary tract: No evidence of urolithiasis or hydronephrosis. Unremarkable unopacified urinary bladder. Stomach/Bowel: New small amount of free intraperitoneal air is seen, consistent with bowel perforation. Mild ascites is new since previous study, as well as diffuse mesenteric and body wall edema. Large amount of stool is again seen throughout the colon. A transition point is again seen near the rectosigmoid junction, consistent with stricture. No definite soft tissue mass appreciated by CT. Mild diverticulosis and wall thickening is seen involving the proximal sigmoid, which is similar to previous study and suspicious for mild diverticulitis. No evidence of small bowel wall thickening or obstruction. No abscess identified. Vascular/Lymphatic: No pathologically enlarged lymph nodes identified. No evidence of abdominal aortic aneurysm. Reproductive: Prior hysterectomy. Limited visualization due to artifact from left hip prosthesis. Other:  None. Musculoskeletal:  No suspicious bone lesions identified. IMPRESSION: 1. New small amount of free  intraperitoneal air, consistent with bowel perforation. 2. Large colonic stool burden with transition point near the rectosigmoid junction, consistent with stricture. No definite soft tissue mass appreciated by CT. This is unchanged in appearance compared to previous study. Recommend correlation with colonoscopy to exclude obstructing colon carcinoma. 3. Mild diverticulitis of proximal sigmoid colon, without significant  change. 4. New mild ascites and diffuse body wall edema. New small bilateral pleural effusions and bibasilar atelectasis. 5. Cholelithiasis. No radiographic evidence of cholecystitis. Critical Value/emergent results were called by telephone at the time of interpretation on 02/27/2018 at 4:35 pm to Dr. Shanon Brow TAT , who verbally acknowledged these results. Electronically Signed   By: Earle Gell M.D.   On: 02/27/2018 16:38   Ct Abdomen Pelvis W Contrast  Result Date: 02/20/2018 CLINICAL DATA:  Lower abdominal pain and constipation for the past 2 days. Clinical suspicion for diverticulitis. EXAM: CT ABDOMEN AND PELVIS WITH CONTRAST TECHNIQUE: Multidetector CT imaging of the abdomen and pelvis was performed using the standard protocol following bolus administration of intravenous contrast. CONTRAST:  148mL OMNIPAQUE IOHEXOL 300 MG/ML  SOLN COMPARISON:  Abdomen radiographs dated 02/06/2018. Abdomen and pelvis CT dated 04/08/2015. FINDINGS: Lower chest: Unremarkable. Hepatobiliary: No significant change in previously demonstrated liver cysts. Small number of tiny gallstones in the gallbladder measuring up to 4 mm in maximum diameter each. No gallbladder wall thickening or pericholecystic fluid. Pancreas: Unremarkable. No pancreatic ductal dilatation or surrounding inflammatory changes. Spleen: 5 mm oval area of low density in the spleen on image number 29 series 2, difficult to detect with certainty on the previous examination, partly due to differences in technique and timing. Adrenals/Urinary Tract: Normal appearing adrenal glands. Stable tiny exophytic upper pole left renal cyst. Normal appearing right kidney, ureters and urinary bladder. Stomach/Bowel: Large amount of stool throughout the colon to the level of the distal sigmoid colon where there is a short segment of concentric, medium density wall thickening, best seen on image number 72 series 2. The wall thickening is concentric and lower in density on coronal  image number 90. Lesser amount of stool and gas in the normal caliber rectum. Minimal sigmoid colon diverticulosis. Mild pericolonic soft tissue stranding involving the distal sigmoid colon, most pronounced at the level of maximal distension of the colon by the stool. Normal appearing stomach, small bowel and appendix. Vascular/Lymphatic: Atheromatous arterial calcifications without aneurysm. No enlarged lymph nodes. Inferior vena cava filter with its tip just above the level of the renal veins. Reproductive: Status post hysterectomy. No adnexal masses. Other: Small amount of free peritoneal fluid in the pelvis. Musculoskeletal: Left hip prosthesis. Severe right hip degenerative changes with severe joint space narrowing, extensive subarticular cyst formation, spur formation and bony remodeling. Moderate levoconvex thoracolumbar scoliosis. Interbody and pedicle screw and rod fusion at the L4-5 level. Fusion of portions of the T12 and L1 vertebral bodies. Lumbar and lower thoracic spine degenerative changes. IMPRESSION: 1. Large amount of stool throughout the colon to the level of the distal sigmoid colon where there is a short segment of concentric, medium density wall thickening. This is concerning for an obstructing short segment mass. It would be unusual for edema due to diverticulitis to involve that short of a segment of bowel. Correlation with sigmoidoscopy or colonoscopy is recommended. 2. Mild pericolonic soft tissue stranding involving the distal sigmoid colon, most pronounced at the level of maximal distension of the colon by the stool. This is suspicious for mild diverticulitis without abscess. There is minimal diverticulosis in that region  of the colon. 3. Cholelithiasis. 4. 5 mm probable hemangioma in the spleen. Electronically Signed   By: Claudie Revering M.D.   On: 02/20/2018 20:59   Dg Chest Port 1 View  Result Date: 03/04/2018 CLINICAL DATA:  Pulmonary edema. EXAM: PORTABLE CHEST 1 VIEW COMPARISON:   03/03/2018 FINDINGS: 0538 hours. Endotracheal tube tip is 5.1 cm above the base of the carina. NG tube passes into the stomach. Right PICC line tip overlies the distal SVC level. Lung volumes are low. The cardio pericardial silhouette is enlarged. There is pulmonary vascular congestion without overt pulmonary edema. Basilar atelectasis noted bilaterally with tiny bilateral pleural effusions. IMPRESSION: Low lung volumes with cardiomegaly, vascular congestion and tiny effusions. Electronically Signed   By: Misty Stanley M.D.   On: 03/04/2018 08:39   Dg Chest Port 1 View  Result Date: 03/03/2018 CLINICAL DATA:  Pulmonary edema. EXAM: PORTABLE CHEST 1 VIEW COMPARISON:  Radiograph of March 02, 2018. FINDINGS: Stable cardiomediastinal silhouette. Atherosclerosis of thoracic aorta is noted. Endotracheal and nasogastric tubes are unchanged in position. Right-sided PICC line is unchanged in position. Stable hypoinflation of the lungs is noted with associated bibasilar atelectasis and small pleural effusions. No pneumothorax is noted. Severe degenerative changes seen involving the left glenohumeral joint. IMPRESSION: Stable support apparatus. Stable hypoinflation of the lungs with mild bibasilar subsegmental atelectasis and small pleural effusions. Aortic Atherosclerosis (ICD10-I70.0). Electronically Signed   By: Marijo Conception, M.D.   On: 03/03/2018 08:27   Dg Chest Port 1 View  Result Date: 03/02/2018 CLINICAL DATA:  Hypoxia EXAM: PORTABLE CHEST 1 VIEW COMPARISON:  March 01, 2018 FINDINGS: Endotracheal tube tip is 3.7 cm above the carina. Nasogastric tube tip and side port are in the stomach. Right subclavian catheter tip is at the cavoatrial junction. No pneumothorax. There is cardiomegaly with pulmonary venous hypertension. There are small pleural effusions bilaterally. There is atelectatic change in the lung bases. A degree of consolidation in the left base is questioned. There is advanced arthropathy in  the left shoulder. IMPRESSION: Tube and catheter positions as described without pneumothorax. Pulmonary vascular congestion present with small pleural effusions bilaterally. There is bibasilar atelectasis with a questionable degree of consolidation in the left base. Aortic Atherosclerosis (ICD10-I70.0). Electronically Signed   By: Lowella Grip III M.D.   On: 03/02/2018 07:38   Dg Chest Port 1 View  Result Date: 03/01/2018 CLINICAL DATA:  Ventilator dependent respiratory failure. Follow-up pulmonary edema. EXAM: PORTABLE CHEST 1 VIEW COMPARISON:  02/28/2018, 02/28/2016 and earlier. FINDINGS: Endotracheal tube tip in satisfactory position projecting approximately 3-4 cm above the carina. RIGHT arm PICC tip projects at or near the cavoatrial junction, unchanged. Nasogastric tube looped in the stomach with its tip in the fundus. Markedly suboptimal inspiration with worsening atelectasis in the lung bases, LEFT greater than RIGHT. Resolution of interstitial pulmonary edema. Persistent pulmonary venous hypertension. IMPRESSION: 1.  Support apparatus satisfactory. 2. Resolution of interstitial pulmonary edema since yesterday, though pulmonary venous hypertension persists. 3. Worsening bibasilar atelectasis, LEFT greater than RIGHT. Electronically Signed   By: Evangeline Dakin M.D.   On: 03/01/2018 09:01   Dg Chest Port 1 View  Result Date: 02/28/2018 CLINICAL DATA:  Intubation. EXAM: PORTABLE CHEST 1 VIEW COMPARISON:  02/26/2018 FINDINGS: The endotracheal tube is 3.3 cm above the carina. There is an NG tube coursing down the esophagus and into the stomach. The right-sided PICC line is stable. Significant worsening lung aeration with very low lung volumes, vascular crowding, pulmonary edema and basilar atelectasis.  The heart remains enlarged and there is tortuosity and calcification of the thoracic aorta. IMPRESSION: 1. The endotracheal tube and NG tubes are in good position. 2. Very low lung volumes with  vascular crowding and atelectasis 3. Vascular congestion and pulmonary edema. Electronically Signed   By: Marijo Sanes M.D.   On: 02/28/2018 16:37   Dg Chest Port 1 View  Result Date: 02/26/2018 CLINICAL DATA:  84 year old female with intermittent productive cough EXAM: PORTABLE CHEST 1 VIEW COMPARISON:  02/28/2016 FINDINGS: Cardiomediastinal silhouette unchanged. Interval development of asymmetric elevation the right hemidiaphragm. No pneumothorax. Low lung volumes with coarsened interstitial markings and interlobular septal thickening. No large pleural effusion. No confluent airspace disease. Interval placement of right upper extremity PICC with the tip appearing to terminate superior vena cava. Degenerative changes of the left glenohumeral joint. IMPRESSION: Low lung volumes with questionable edema. New asymmetric elevation the right hemidiaphragm, uncertain significance. Right upper extremity PICC. Electronically Signed   By: Corrie Mckusick D.O.   On: 02/26/2018 19:28   [place] Ekg Site Rite  Result Date: 02/22/2018 If Site Rite image not attached, placement could not be confirmed due to current cardiac rhythm.  Time: 25 minutes.   Barton Dubois, MD  [facility] Pager [phone] 03/11/2018, 9:38 AM   LOS: 19 days

## 2018-03-12 ENCOUNTER — Ambulatory Visit: Payer: Medicare Other | Admitting: Pediatrics

## 2018-03-12 ENCOUNTER — Ambulatory Visit: Payer: Medicare Other | Admitting: Family Medicine

## 2018-03-12 ENCOUNTER — Encounter (HOSPITAL_COMMUNITY): Payer: Self-pay | Admitting: General Surgery

## 2018-03-12 DIAGNOSIS — K631 Perforation of intestine (nontraumatic): Secondary | ICD-10-CM

## 2018-03-12 LAB — CBC
HCT: 24.7 % — ABNORMAL LOW (ref 36.0–46.0)
Hemoglobin: 7.5 g/dL — ABNORMAL LOW (ref 12.0–15.0)
MCH: 29.3 pg (ref 26.0–34.0)
MCHC: 30.4 g/dL (ref 30.0–36.0)
MCV: 96.5 fL (ref 80.0–100.0)
PLATELETS: 174 10*3/uL (ref 150–400)
RBC: 2.56 MIL/uL — ABNORMAL LOW (ref 3.87–5.11)
RDW: 14.7 % (ref 11.5–15.5)
WBC: 10.4 10*3/uL (ref 4.0–10.5)
nRBC: 0 % (ref 0.0–0.2)

## 2018-03-12 LAB — BASIC METABOLIC PANEL
Anion gap: 8 (ref 5–15)
BUN: 39 mg/dL — AB (ref 8–23)
CO2: 33 mmol/L — ABNORMAL HIGH (ref 22–32)
Calcium: 8.2 mg/dL — ABNORMAL LOW (ref 8.9–10.3)
Chloride: 96 mmol/L — ABNORMAL LOW (ref 98–111)
Creatinine, Ser: 1.33 mg/dL — ABNORMAL HIGH (ref 0.44–1.00)
GFR calc Af Amer: 43 mL/min — ABNORMAL LOW (ref >60)
GFR, EST NON AFRICAN AMERICAN: 37 mL/min — AB (ref >60)
Glucose, Bld: 141 mg/dL — ABNORMAL HIGH (ref 70–99)
Potassium: 4.2 mmol/L (ref 3.5–5.1)
Sodium: 137 mmol/L (ref 135–145)

## 2018-03-12 LAB — GLUCOSE, CAPILLARY
Glucose-Capillary: 142 mg/dL — ABNORMAL HIGH (ref 70–99)
Glucose-Capillary: 126 mg/dL — ABNORMAL HIGH (ref 70–99)
Glucose-Capillary: 160 mg/dL — ABNORMAL HIGH (ref 70–99)
Glucose-Capillary: 145 mg/dL — ABNORMAL HIGH (ref 70–99)

## 2018-03-12 MED ORDER — PRO-STAT SUGAR FREE PO LIQD
30.0000 mL | Freq: Three times a day (TID) | ORAL | Status: DC
  Administered 2018-03-12 – 2018-03-13 (×2): 30 mL via ORAL
  Filled 2018-03-12 (×3): qty 30

## 2018-03-12 MED ORDER — ADULT MULTIVITAMIN W/MINERALS CH
1.0000 | ORAL_TABLET | Freq: Every day | ORAL | Status: DC
  Administered 2018-03-12 – 2018-03-13 (×2): 1 via ORAL
  Filled 2018-03-12 (×6): qty 1

## 2018-03-12 MED ORDER — JUVEN PO PACK
1.0000 | PACK | Freq: Two times a day (BID) | ORAL | Status: DC
  Administered 2018-03-12 – 2018-03-13 (×3): 1 via ORAL
  Filled 2018-03-12 (×3): qty 1

## 2018-03-12 NOTE — Care Management Important Message (Signed)
Important Message  Patient Details  Name: RHANDI DESPAIN MRN: [id number] Date of Birth: [birth year]   Medicare Important Message Given:  Yes    Brinly Maietta, Chauncey Reading, RN 03/12/2018, 1:21 PM

## 2018-03-12 NOTE — Clinical Social Work Note (Signed)
Status update provided to [facility]. [facility] will order wound vac for patient upon receipt of discharge summary.     Skylen Danielsen, Clydene Pugh, LCSW

## 2018-03-12 NOTE — Progress Notes (Signed)
PROGRESS NOTE    Regina Hall  POE:[id number] DOB: 06/01/1934 DOA: 02/20/2018 PCP: [name], DO   Brief Narrative:  Per HPI: 84 year old female with hypertension, history of left leg DVT status post IVC filter presented to the hospital with abdominal pain. CT of the abdomen pelvis shows diverticulitis with large amount of stool throughout the colon and concern for underlying colonic obstruction. Underwent sigmoidoscopy showing obstruction in the sigmoid colon with marked edema. GI and surgery following. The patient subsequently developed a new fever. Blood and urine cultures were ordered. Repeat CT abd/pelvis ordered 2/13showed free air. Case was discussed with general surgery and plans to go to OR 2/14. Pt underwent a Hartman's procedure on 02/28/18. She was transferred to ICU on ventilator post-op.  Post-op, she was hypotensive and started on neosynephrine. She also developed new onset atrial fibrillation. She was started on amiodarone IVon 02/28/18. She converted to sinus after ~5 hours. She was kept on amiodarone due to continuedcritical illnessand high risk of reverting back to afib. She developed fluid overload due to fluid resuscitation and was started on furosemide   Assessment & Plan:   Active Problems:   Mass of colon   Abnormal CT scan, sigmoid colon   Diverticulitis of large intestine without perforation or abscess without bleeding   Large bowel obstruction (HCC)   Constipation   Fever   AKI (acute kidney injury) ([place])   Diverticulitis of large intestine with perforation   Bowel perforation (HCC)   Fecal peritonitis (HCC)   Bronchospasm   Pulmonary edema   Wound dehiscence, surgical   Large bowel (sigmoid) obstruction -Underwent sigmoidoscopy showing obstruction in the sigmoid colon with marked edema. Surgery following and recommend initially non-operative tx  -2/7--Underwent sigmoidoscopy showing obstruction in the sigmoid colon with marked  edema. -GI recommends outpatient colonoscopy in 6 weeks for full evaluation of the colon. -Sigmoidoscopy biopsy--tubular adenoma -02/27/18 CT abd--New small amount of free intraperitoneal air, consistent with bowel perforation -2/14--Hartmann procedure -2/24 revisit to OR for exploration, peritoneal wash and reclosure of abd wound.  -Positive biopsy for serous adenocarcinoma.  Will follow further recommendation by general surgery. -advance diet as per surgery rec's; tolerating full liquid.  -Patient ready for DC when bed available per GS.  Acute diverticulitiswith perforation/peritonitis -cipro/flagyl d/ced -ertapenem started 2/13 -02/27/18 CT abd--New small amount of free intraperitoneal air, consistent with bowel perforation -2/14--Hartmann procedure -slowly advancing diet. -2/16--weaned off neosynephrine; has remained stable and off pressors.  -continue ertapenem, linezolid and antifungal regimen; general surgery to determine length. -2/18--WBC went up likely due dehiscence of the abd wound and and concerns for underlying peritonitis.     -2/24 revisited OR for exploration/re-closure of abd wound. Well tolerated. After peritoneal wash WBC's 13.5 and no fever. Patient with Wound Vac in place. Follow general surgery and wound care service recommendations.  New Onset Atrial Fibrillation -started amiodarone drip due to hypotension2/14/20 -converted back to sinus after 4-5 hours on amio -continue amiodarone 200 mg bid by mouth, with intention to remain on amiodarone for 1 month and follow-up with cardiology service as an outpatient. -case discussed with Dr. Harl Bowie  Acute on chronic renal failure--CKD stage 3 -baseline creatinine 0.9-1.2 -nonoliguric -consultation to nephrology--appreciated recommendations and inputs. -due to infectious process and hemodynamic changes -volume status significantly improved -continue oral lasix now.  -Follow-up renal function trend -Cr 1.24  currently.   Acute respiratory failure with hypoxia -due to hypoventilation, fluidoverload, bronchospasm -appreciate pulmonary service inputs and recommendations. -echo--EF 60-65%, mild LAE -Continue weaning  oxygen supplementation as toelrated. -continue oral lasix and follow volume status.  ??Hematemesis -RN reported multiple episodes of rust/red/brown emesis -consult GI--noted input -continuePPI bid -no further bleeding appreciated.  Bronchospasm/wheezing -continueas needed Xopenex/atrovent -continuepulmicort -no wheezing on exam.  Chronic bilateral lower extremity edema -Suspect secondary to venous insufficiency/hypoalbuminemia, fluid overload status has significantly Improved and is stable. -Will continue lasix daily, but transition to 40mg  daily.  Essential hypertension -Continue holding losartan in the setting of hypotension and acute kidney injury. -Blood pressure stable. -Continue to monitor  Physical deconditioning -PT eval-->SNF for physical rehabilitation. -Reports ambulating with a walker due to scoliosisprior to hospitalization.    DVT prophylaxis:Lovenox Code Status: Full Family Communication: None at bedside Disposition Plan: Transfer to SNF with further wound care when bed available   Consultants:   Gen Surg  GI  Renal  Procedures:   As noted above  Antimicrobials:  cipro2/7>>>2/13 Flagyl2/7>>>2/13 Ertapenem 2/13>>>> linezolid 2/17>>> Fluconazole 2/19>>>   Subjective: Patient seen and evaluated today with no new acute complaints or concerns. No acute concerns or events noted overnight.   Objective: Vitals:   03/11/18 2218 03/12/18 0546 03/12/18 0739 03/12/18 1422  BP: (!) 113/45 (!) 110/37  (!) 118/46  Pulse: 80 67  70  Resp: 20 20  18   Temp: 99.6 F (37.6 C) 99.1 F (37.3 C)  98.2 F (36.8 C)  TempSrc: Oral Oral  Oral  SpO2: 100% 100% 98% 99%  Weight:      Height:        Intake/Output Summary (Last 24 hours)  at 03/12/2018 1501 Last data filed at 03/12/2018 1115 Gross per 24 hour  Intake 360 ml  Output 2300 ml  Net -1940 ml   Filed Weights   03/09/18 0439 03/10/18 0419 03/11/18 5400  Weight: 108.9 kg 109.8 kg 113.4 kg    Examination:  General exam: Appears calm and comfortable  Respiratory system: Clear to auscultation. Respiratory effort normal. Cardiovascular system: S1 & S2 heard, RRR. No JVD, murmurs, rubs, gallops or clicks. No pedal edema. Gastrointestinal system: Abdomen is nondistended, soft and nontender. No organomegaly or masses felt. Normal bowel sounds heard. Colostomy bag with liquid stool. Mid abdominal incision with no active drainage and wound VAC in place. Central nervous system: Alert and oriented. No focal neurological deficits. Extremities: Symmetric 5 x 5 power. Skin: No rashes, lesions or ulcers Psychiatry: Judgement and insight appear normal. Mood & affect appropriate.     Data Reviewed: I have personally reviewed following labs and imaging studies  CBC: Recent Labs  Lab 03/06/18 0415 03/07/18 0526 03/08/18 0611 03/10/18 0434 03/12/18 0412  WBC 17.0* 15.7* 18.8* 13.5* 10.4  HGB 8.5* 9.0* 9.4* 8.7* 7.5*  HCT 26.7* 29.1* 30.2* 27.9* 24.7*  MCV 93.0 94.8 95.0 96.5 96.5  PLT 278 297 305 263 867   Basic Metabolic Panel: Recent Labs  Lab 03/06/18 0415 03/07/18 0526 03/08/18 0611 03/09/18 0610 03/10/18 0434 03/12/18 0412  NA 139 141 140 139 138 137  K 3.2* 3.5 3.5 3.5 3.8 4.2  CL 102 103 99 99 97* 96*  CO2 28 31 31  32 33* 33*  GLUCOSE 184* 120* 131* 120* 115* 141*  BUN 48* 43* 37* 34* 32* 39*  CREATININE 1.38* 1.31* 1.21* 1.24* 1.25* 1.33*  CALCIUM 8.0* 8.2* 8.3* 8.3* 8.4* 8.2*  MG  --  1.8  --   --   --   --   PHOS 3.1 3.3 3.1 3.2  --   --    GFR: Estimated Creatinine  Clearance: 42.4 mL/min (A) (by C-G formula based on SCr of 1.33 mg/dL (H)). Liver Function Tests: Recent Labs  Lab 03/06/18 0415 03/07/18 0526 03/08/18 0611 03/09/18 0610  03/10/18 0434  AST  --   --   --   --  15  ALT  --   --   --   --  9  ALKPHOS  --   --   --   --  41  BILITOT  --   --   --   --  0.7  PROT  --   --   --   --  4.3*  ALBUMIN 2.0* 2.0* 2.0* 2.0* 2.0*   No results for input(s): LIPASE, AMYLASE in the last 168 hours. No results for input(s): AMMONIA in the last 168 hours. Coagulation Profile: No results for input(s): INR, PROTIME in the last 168 hours. Cardiac Enzymes: No results for input(s): CKTOTAL, CKMB, CKMBINDEX, TROPONINI in the last 168 hours. BNP (last 3 results) No results for input(s): PROBNP in the last 8760 hours. HbA1C: No results for input(s): HGBA1C in the last 72 hours. CBG: Recent Labs  Lab 03/11/18 1127 03/11/18 1600 03/11/18 2247 03/12/18 0734 03/12/18 1103  GLUCAP 158* 147* 161* 142* 126*   Lipid Profile: No results for input(s): CHOL, HDL, LDLCALC, TRIG, CHOLHDL, LDLDIRECT in the last 72 hours. Thyroid Function Tests: No results for input(s): TSH, T4TOTAL, FREET4, T3FREE, THYROIDAB in the last 72 hours. Anemia Panel: No results for input(s): VITAMINB12, FOLATE, FERRITIN, TIBC, IRON, RETICCTPCT in the last 72 hours. Sepsis Labs: No results for input(s): PROCALCITON, LATICACIDVEN in the last 168 hours.  No results found for this or any previous visit (from the past 240 hour(s)).       Radiology Studies: No results found.      Scheduled Meds: . amiodarone  200 mg Oral BID  . Chlorhexidine Gluconate Cloth  6 each Topical Daily  . enoxaparin (LOVENOX) injection  40 mg Subcutaneous Q24H  . feeding supplement (ENSURE ENLIVE)  237 mL Oral BID BM  . feeding supplement (PRO-STAT SUGAR FREE 64)  30 mL Oral TID  . fluconazole  200 mg Oral Daily  . furosemide  40 mg Oral Daily  . multivitamin with minerals  1 tablet Oral Daily  . nutrition supplement (JUVEN)  1 packet Oral BID BM  . pantoprazole  40 mg Oral Q1200  . potassium chloride  40 mEq Oral Daily  . sodium chloride flush  10-40 mL  Intracatheter Q12H   Continuous Infusions: . sodium chloride 10 mL/hr at 03/10/18 1634  . ertapenem 1,000 mg (03/11/18 1751)  . linezolid (ZYVOX) IV 600 mg (03/12/18 1127)     LOS: 20 days    Time spent: 30 minutes    Merrick Feutz Darleen Crocker, DO [facility] Pager [phone]  If 7PM-7AM, please contact night-coverage [email or web address] Password [email or web address] 03/12/2018, 3:01 PM

## 2018-03-12 NOTE — Progress Notes (Signed)
Nutrition Follow-up  DOCUMENTATION CODES:   Obesity unspecified  INTERVENTION:  -Ensure Enlive po BID, VANILLA-each supplement provides 350 kcal and 20 grams of protein   -ProStat 30 ml TID (each 30 ml provides 100 kcal, 15 gr protein)   -1 packet Juven BID, each packet provides 80 calories, 8 grams of carbohydrate, and 14 grams of amino acids; supplement contains CaHMB, glutamine, and arginine, to promote wound healing   NUTRITION DIAGNOSIS:   Increased nutrient needs related to post-op healing as evidenced by estimated needs.  GOAL:   Patient will meet greater than or equal to 90% of their needs   MONITOR:   PO intake, Supplement acceptance, Diet advancement, Skin, Weight trends, Labs, I & O's   ASSESSMENT:  84 y/o female PMHx HTN, anxiety, CKD. Originally presented to [facility] on 2/6 w/ constipation and abdominal pain x3 days. CT showed mass/obstruction of sigmoid colon. Admitted for management.    Patient is s/p Hartman's Procedure on 2/14 and returned on 2/24 due to dehiscence of abdominal wound. Wound vac present and WOC following. Positive biopsy- serous carcinoma.   Patient appetite is poor and documented intake 0-50% range throughout  Her 20 day hospitalization. Emphasized to pt need for optimal protein and energy for healing. She likes the Ensure and prefers vanilla. Will Add protein modular due to poor meal intake.   Patient weight has increased 3 kg since admission likely fluid related given her recent surgery and lack of desire to eat.  Medications reviewed and include: Lasix, Protonix, KCL, Zyvox, Invanz  Labs: BMP Latest Ref Rng & Units 03/12/2018 03/10/2018 03/09/2018  Glucose 70 - 99 mg/dL 141(H) 115(H) 120(H)  BUN 8 - 23 mg/dL 39(H) 32(H) 34(H)  Creatinine 0.44 - 1.00 mg/dL 1.33(H) 1.25(H) 1.24(H)  BUN/Creat Ratio 12 - 28 - - -  Sodium 135 - 145 mmol/L 137 138 139  Potassium 3.5 - 5.1 mmol/L 4.2 3.8 3.5  Chloride 98 - 111 mmol/L 96(L) 97(L) 99  CO2 22 - 32  mmol/L 33(H) 33(H) 32  Calcium 8.9 - 10.3 mg/dL 8.2(L) 8.4(L) 8.3(L)     Nutrition focused physical exam: Lower extremity edema (bilaterally)- orbital and buccal fat loss.   Diet Order:   Diet Order            DIET SOFT Room service appropriate? Yes; Fluid consistency: Thin  Diet effective now              EDUCATION NEEDS:   No education needs have been identified at this time  Skin:  Skin Assessment: Skin Integrity Issues: Skin Integrity Issues:: Incisions Incisions: Abdomen. new colostomy- wound vac   Last BM:  2/24 medium (50 ml)  Height:   Ht Readings from Last 1 Encounters:  03/03/18 5\' 6"  (1.676 m)    Weight:   Wt Readings from Last 1 Encounters:  03/11/18 113.4 kg    Ideal Body Weight:  59.1 kg  BMI:  Body mass index is 40.35 kg/m.  Estimated Nutritional Needs:   Kcal:  1650-1850 kcals (15-17 kcal/kg bw)  Protein: 89-100 gr (1.5-1.7 gr/kg/ibw)  Fluid:  Per MDs assessment   Colman Cater MS,RD,CSG,LDN Office: [phone] Pager: [phone]

## 2018-03-12 NOTE — Consult Note (Signed)
[facility] Nurse wound follow-up consult note Reason for Consult: Vac dressing changed to midline abd wound Wound type: full thickness post-op Measurement: 15X7X1.3cm with undermining to lower wound edges to 1 cm Wound bed: beefy red woundbed over mesh which was applied in surgery Drainage (amount, consistency, odor) mod amt pink drainage, no odor Periwound: intact skin surrounding Dressing procedure/placement/frequency: Applied one piece Mepitel contact layer to protect inner mesh, then one piece black foam to 150mm cont suction.  Pt was medicated for pain prior to procedure and tolerated with mod amt discomfort. Vac dressings will need to be changed Q M/W/F  WOC Nurse ostomy consult note Stoma type/location: Stoma is red and viable, flush with skin level Stomal assessment/size:  1 1/2 inches; there is mucocutaneous separation from 6:00 o'clock to 12:00 o'clock, red moist edge and stoma is offset to left side Treatment options for stomal/peristomal skin: barrier ring and convex pouch applied to attempt to maintain a seal Output: small amt liquid brown stool Ostomy pouching: 1pc.  Education provided:  Demonstrated pouch application and emptying.  Pt did not participate or ask questions.  She will need total assistance with pouching activities after discharge.  No family members present  In the room.   Enrolled patient in [employer] program: Yes, previously [place] team will change ostomy pouch and Vac dressing on Friday if patient is still in the hospital at that time. Julien Girt MSN, RN, [place], [place], [place]

## 2018-03-13 ENCOUNTER — Inpatient Hospital Stay
Admission: RE | Admit: 2018-03-13 | Discharge: 2018-04-19 | Disposition: A | Discharge status: Home / Self Care | Payer: Medicare Other | Source: Ambulatory Visit | Attending: Internal Medicine | Admitting: Internal Medicine

## 2018-03-13 LAB — BASIC METABOLIC PANEL
Anion gap: 8 (ref 5–15)
BUN: 43 mg/dL — ABNORMAL HIGH (ref 8–23)
CO2: 32 mmol/L (ref 22–32)
Calcium: 8.6 mg/dL — ABNORMAL LOW (ref 8.9–10.3)
Chloride: 96 mmol/L — ABNORMAL LOW (ref 98–111)
Creatinine, Ser: 1.24 mg/dL — ABNORMAL HIGH (ref 0.44–1.00)
GFR calc non Af Amer: 41 mL/min — ABNORMAL LOW (ref >60)
GFR, EST AFRICAN AMERICAN: 47 mL/min — AB (ref >60)
Glucose, Bld: 124 mg/dL — ABNORMAL HIGH (ref 70–99)
Potassium: 4.5 mmol/L (ref 3.5–5.1)
Sodium: 136 mmol/L (ref 135–145)

## 2018-03-13 LAB — CBC
HCT: 23.2 % — ABNORMAL LOW (ref 36.0–46.0)
Hemoglobin: 7.1 g/dL — ABNORMAL LOW (ref 12.0–15.0)
MCH: 30 pg (ref 26.0–34.0)
MCHC: 30.6 g/dL (ref 30.0–36.0)
MCV: 97.9 fL (ref 80.0–100.0)
NRBC: 0 % (ref 0.0–0.2)
Platelets: 148 10*3/uL — ABNORMAL LOW (ref 150–400)
RBC: 2.37 MIL/uL — AB (ref 3.87–5.11)
RDW: 14.6 % (ref 11.5–15.5)
WBC: 7.5 10*3/uL (ref 4.0–10.5)

## 2018-03-13 LAB — GLUCOSE, CAPILLARY
GLUCOSE-CAPILLARY: 112 mg/dL — AB (ref 70–99)
Glucose-Capillary: 106 mg/dL — ABNORMAL HIGH (ref 70–99)
Glucose-Capillary: 119 mg/dL — ABNORMAL HIGH (ref 70–99)
Glucose-Capillary: 148 mg/dL — ABNORMAL HIGH (ref 70–99)

## 2018-03-13 MED ORDER — AMIODARONE HCL 200 MG PO TABS: 200.0000 mg | ORAL_TABLET | Freq: Two times a day (BID) | ORAL | 0 refills | Status: DC

## 2018-03-13 MED ORDER — ENSURE ENLIVE PO LIQD: 237.0000 mL | Freq: Two times a day (BID) | ORAL | 12 refills | Status: DC

## 2018-03-13 MED ORDER — PANTOPRAZOLE SODIUM 40 MG PO TBEC: 40.0000 mg | DELAYED_RELEASE_TABLET | Freq: Every day | ORAL | 0 refills | Status: DC

## 2018-03-13 MED ORDER — OXYCODONE-ACETAMINOPHEN 5-325 MG PO TABS: 1.0000 | ORAL_TABLET | Freq: Four times a day (QID) | ORAL | 0 refills | Status: DC | PRN

## 2018-03-13 MED ORDER — PRO-STAT SUGAR FREE PO LIQD: 30.0000 mL | Freq: Three times a day (TID) | ORAL | 0 refills | Status: DC

## 2018-03-13 MED ORDER — ADULT MULTIVITAMIN W/MINERALS CH: 1.0000 | ORAL_TABLET | Freq: Every day | ORAL | 0 refills | Status: DC

## 2018-03-13 MED ORDER — POTASSIUM CHLORIDE CRYS ER 20 MEQ PO TBCR: 40.0000 meq | EXTENDED_RELEASE_TABLET | Freq: Every day | ORAL | 0 refills | Status: DC

## 2018-03-13 MED ORDER — FUROSEMIDE 40 MG PO TABS: 40.0000 mg | ORAL_TABLET | Freq: Every day | ORAL | 0 refills | Status: DC

## 2018-03-13 MED ORDER — JUVEN PO PACK: 1.0000 | PACK | Freq: Two times a day (BID) | ORAL | 0 refills | Status: DC

## 2018-03-13 MED ORDER — CHLORHEXIDINE GLUCONATE CLOTH 2 % EX PADS: 6.0000 | MEDICATED_PAD | Freq: Every day | CUTANEOUS | 0 refills | Status: DC

## 2018-03-13 NOTE — Discharge Summary (Addendum)
Physician Discharge Summary  Regina Hall SAY:[id number] DOB: Dec 14, 1934 DOA: 02/20/2018  PCP: Regina Norlander, DO  Admit date: 02/20/2018  Discharge date: 03/13/2018  Admitted From:Home  Disposition:  SNF  Recommendations for Outpatient Follow-up:  1. Follow up with PCP in 1-2 weeks 2. Obtain repeat CBC/BMP in 1 week to ensure stability 3. Continue wound VAC care as prescribed 4. Follow-up with general surgery Dr. Arnoldo Morale as recommended 5. Repeat CT abdomen/pelvis in 3 months to follow-up for any mass related to biopsy of peritoneum demonstrating serous adenocarcinoma.  Home Health: None  Equipment/Devices: None  Discharge Condition: Stable  CODE STATUS: Full  Diet recommendation: Heart Healthy  Brief/Interim Summary: Per HPI: 84 year old female with hypertension, history of left leg DVT status post IVC filter presented to the hospital with abdominal pain. CT of the abdomen pelvis shows diverticulitis with large amount of stool throughout the colon and concern for underlying colonic obstruction. Underwent sigmoidoscopy showing obstruction in the sigmoid colon with marked edema. GI and surgery following. The patient subsequently developed a new fever. Blood and urine cultures were ordered. Repeat CT abd/pelvis ordered 2/13showed free air. Case was discussed with general surgery and plans to go to OR 2/14. Pt underwent a Hartman's procedure on 02/28/18. She was transferred to ICU on ventilator post-op.  Post-op, she was hypotensive and started on neosynephrine. She also developed new onset atrial fibrillation. She was started on amiodarone IVon 02/28/18. She converted to sinus after ~5 hours. She was kept on amiodarone due to continuedcritical illnessand high risk of reverting back to afib.  She is now tolerating oral amiodarone. She developed fluid overload due to fluid resuscitation and was started on furosemide.  She was then noted to have increasing leukocytosis  on 2/18 which was likely due to dehiscence of her abdominal wound and there were also concerns for underlying peritonitis.  She unfortunately had to revisit the OR for exploration and peritoneal washing reclosure of her abdominal wound on 2/24 and now has a wound VAC.  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  She is now tolerating a full liquid diet and will no longer need any further antibiotics upon discharge.  She has been on ertapenem since 2/13 as well as linezolid since 2/17 and fluconazole since 2/19 which will be discontinued at this time.  She requires SNF level of care and further work with physical therapy in an intensive setting as she was ambulating with a walker prior to hospitalization.  She will continue on Lasix for now as otherwise prescribed.  She has had a mild downtrend in her hemoglobin and hematocrit levels, but this does not appear to be related to any blood loss and there has been no overt bleeding identified.  Patient may require some subsequent transfusion in the near future should she continue to downtrend on repeat CBC evaluation.  Discharge Diagnoses:  Active Problems:   Mass of colon   Abnormal CT scan, sigmoid colon   Diverticulitis of large intestine without perforation or abscess without bleeding   Large bowel obstruction (HCC)   Constipation   Fever   AKI (acute kidney injury) ([place])   Diverticulitis of large intestine with perforation   Bowel perforation (HCC)   Fecal peritonitis (HCC)   Bronchospasm   Pulmonary edema   Wound dehiscence, surgical    Discharge Instructions   Allergies as of 03/13/2018      Reactions   Penicillins Rash, Other (See Comments)  Has patient had a PCN reaction causing immediate rash, facial/tongue/throat swelling, SOB or lightheadedness with hypotension: ###Yes## Has patient had a PCN reaction causing severe rash involving mucus membranes or skin necrosis:  No Has patient had a PCN reaction that required hospitalization No Has patient had a PCN reaction occurring within the last 10 years: No If all of the above answers are "NO", then may proceed with Cephalosporin use.   Vancomycin    Itching, SOB       Medication List    STOP taking these medications   HYDROcodone-acetaminophen 10-325 MG tablet Commonly known as:  NORCO   losartan 25 MG tablet Commonly known as:  COZAAR   Magnesium 250 MG Tabs   magnesium hydroxide 400 MG/5ML suspension Commonly known as:  MILK OF MAGNESIA   mineral oil enema   polyethylene glycol powder powder Commonly known as:  GLYCOLAX/MIRALAX   STOOL SOFTENER LAXATIVE PO     TAKE these medications   amiodarone 200 MG tablet Commonly known as:  PACERONE Take 1 tablet (200 mg total) by mouth 2 (two) times daily for 30 days.   ARTIFICIAL TEARS OP Place 2 drops into both eyes daily as needed (for dry eyes).   aspirin EC 81 MG tablet Take 81 mg by mouth daily.   Biotin 1 MG Caps Take by mouth.   Chlorhexidine Gluconate Cloth 2 % Pads Apply 6 each topically daily for 30 days.   CRANBERRY PO Take by mouth.   feeding supplement (ENSURE ENLIVE) Liqd Take 237 mLs by mouth 2 (two) times daily between meals.   nutrition supplement (JUVEN) Pack Take 1 packet by mouth 2 (two) times daily between meals for 30 days.   feeding supplement (PRO-STAT SUGAR FREE 64) Liqd Take 30 mLs by mouth 3 (three) times daily.   furosemide 40 MG tablet Commonly known as:  LASIX Take 1 tablet (40 mg total) by mouth daily for 30 days. What changed:    medication strength  how much to take  how to take this  when to take this  additional instructions   GLUCOSAMINE SULFATE PO Take 2 tablets by mouth daily.   montelukast 10 MG tablet Commonly known as:  SINGULAIR Take 10 mg by mouth every morning.   multivitamin with minerals Tabs tablet Take 1 tablet by mouth daily for 30 days.    oxyCODONE-acetaminophen 5-325 MG tablet Commonly known as:  PERCOCET/ROXICET Take 1 tablet by mouth every 6 (six) hours as needed for up to 3 days for moderate pain.   pantoprazole 40 MG tablet Commonly known as:  PROTONIX Take 1 tablet (40 mg total) by mouth daily at 12 noon for 30 days.   potassium chloride SA 20 MEQ tablet Commonly known as:  K-DUR,KLOR-CON Take 2 tablets (40 mEq total) by mouth daily for 30 days.   vitamin B-12 500 MCG tablet Commonly known as:  CYANOCOBALAMIN Take 500 mcg by mouth daily.   Vitamin D3 125 MCG (5000 UT) Tabs Take 5,000 Units by mouth daily.      Contact information for after-discharge care    [facility] .   Service:  Skilled Nursing Contact information: [address] [address] [phone]             Allergies  Allergen Reactions  . Penicillins Rash and Other (See Comments)    Has patient had a PCN reaction causing immediate rash, facial/tongue/throat swelling, SOB or lightheadedness with hypotension: ###  Yes## Has patient had a PCN reaction causing severe rash involving mucus membranes or skin necrosis: No Has patient had a PCN reaction that required hospitalization No Has patient had a PCN reaction occurring within the last 10 years: No If all of the above answers are "NO", then may proceed with Cephalosporin use.   . Vancomycin     Itching, SOB     Consultations:  General surgery  GI  Renal   Procedures/Studies: Ct Abdomen Pelvis Wo Contrast  Result Date: 02/27/2018 CLINICAL DATA:  Follow-up diverticulitis. Chronic abdominal pain. EXAM: CT ABDOMEN AND PELVIS WITHOUT CONTRAST TECHNIQUE: Multidetector CT imaging of the abdomen and pelvis was performed following the standard protocol without IV contrast. COMPARISON:  02/20/2018 FINDINGS: Lower chest: New small bilateral pleural effusions and bibasilar atelectasis. Hepatobiliary: Stable small cyst  in left hepatic lobe. No mass visualized on this unenhanced exam. Tiny gallstones again noted, however there is no evidence of cholecystitis or biliary ductal dilatation. Pancreas: No mass or inflammatory process visualized on this unenhanced exam. Spleen:  Within normal limits in size. Adrenals/Urinary tract: No evidence of urolithiasis or hydronephrosis. Unremarkable unopacified urinary bladder. Stomach/Bowel: New small amount of free intraperitoneal air is seen, consistent with bowel perforation. Mild ascites is new since previous study, as well as diffuse mesenteric and body wall edema. Large amount of stool is again seen throughout the colon. A transition point is again seen near the rectosigmoid junction, consistent with stricture. No definite soft tissue mass appreciated by CT. Mild diverticulosis and wall thickening is seen involving the proximal sigmoid, which is similar to previous study and suspicious for mild diverticulitis. No evidence of small bowel wall thickening or obstruction. No abscess identified. Vascular/Lymphatic: No pathologically enlarged lymph nodes identified. No evidence of abdominal aortic aneurysm. Reproductive: Prior hysterectomy. Limited visualization due to artifact from left hip prosthesis. Other:  None. Musculoskeletal:  No suspicious bone lesions identified. IMPRESSION: 1. New small amount of free intraperitoneal air, consistent with bowel perforation. 2. Large colonic stool burden with transition point near the rectosigmoid junction, consistent with stricture. No definite soft tissue mass appreciated by CT. This is unchanged in appearance compared to previous study. Recommend correlation with colonoscopy to exclude obstructing colon carcinoma. 3. Mild diverticulitis of proximal sigmoid colon, without significant change. 4. New mild ascites and diffuse body wall edema. New small bilateral pleural effusions and bibasilar atelectasis. 5. Cholelithiasis. No radiographic evidence of  cholecystitis. Critical Value/emergent results were called by telephone at the time of interpretation on 02/27/2018 at 4:35 pm to Dr. Shanon Brow TAT , who verbally acknowledged these results. Electronically Signed   By: Earle Gell M.D.   On: 02/27/2018 16:38   Ct Abdomen Pelvis W Contrast  Result Date: 02/20/2018 CLINICAL DATA:  Lower abdominal pain and constipation for the past 2 days. Clinical suspicion for diverticulitis. EXAM: CT ABDOMEN AND PELVIS WITH CONTRAST TECHNIQUE: Multidetector CT imaging of the abdomen and pelvis was performed using the standard protocol following bolus administration of intravenous contrast. CONTRAST:  158mL OMNIPAQUE IOHEXOL 300 MG/ML  SOLN COMPARISON:  Abdomen radiographs dated 02/06/2018. Abdomen and pelvis CT dated 04/08/2015. FINDINGS: Lower chest: Unremarkable. Hepatobiliary: No significant change in previously demonstrated liver cysts. Small number of tiny gallstones in the gallbladder measuring up to 4 mm in maximum diameter each. No gallbladder wall thickening or pericholecystic fluid. Pancreas: Unremarkable. No pancreatic ductal dilatation or surrounding inflammatory changes. Spleen: 5 mm oval area of low density in the spleen on image number 29 series 2, difficult to detect  with certainty on the previous examination, partly due to differences in technique and timing. Adrenals/Urinary Tract: Normal appearing adrenal glands. Stable tiny exophytic upper pole left renal cyst. Normal appearing right kidney, ureters and urinary bladder. Stomach/Bowel: Large amount of stool throughout the colon to the level of the distal sigmoid colon where there is a short segment of concentric, medium density wall thickening, best seen on image number 72 series 2. The wall thickening is concentric and lower in density on coronal image number 90. Lesser amount of stool and gas in the normal caliber rectum. Minimal sigmoid colon diverticulosis. Mild pericolonic soft tissue stranding involving the  distal sigmoid colon, most pronounced at the level of maximal distension of the colon by the stool. Normal appearing stomach, small bowel and appendix. Vascular/Lymphatic: Atheromatous arterial calcifications without aneurysm. No enlarged lymph nodes. Inferior vena cava filter with its tip just above the level of the renal veins. Reproductive: Status post hysterectomy. No adnexal masses. Other: Small amount of free peritoneal fluid in the pelvis. Musculoskeletal: Left hip prosthesis. Severe right hip degenerative changes with severe joint space narrowing, extensive subarticular cyst formation, spur formation and bony remodeling. Moderate levoconvex thoracolumbar scoliosis. Interbody and pedicle screw and rod fusion at the L4-5 level. Fusion of portions of the T12 and L1 vertebral bodies. Lumbar and lower thoracic spine degenerative changes. IMPRESSION: 1. Large amount of stool throughout the colon to the level of the distal sigmoid colon where there is a short segment of concentric, medium density wall thickening. This is concerning for an obstructing short segment mass. It would be unusual for edema due to diverticulitis to involve that short of a segment of bowel. Correlation with sigmoidoscopy or colonoscopy is recommended. 2. Mild pericolonic soft tissue stranding involving the distal sigmoid colon, most pronounced at the level of maximal distension of the colon by the stool. This is suspicious for mild diverticulitis without abscess. There is minimal diverticulosis in that region of the colon. 3. Cholelithiasis. 4. 5 mm probable hemangioma in the spleen. Electronically Signed   By: Claudie Revering M.D.   On: 02/20/2018 20:59   Dg Chest Port 1 View  Result Date: 03/04/2018 CLINICAL DATA:  Pulmonary edema. EXAM: PORTABLE CHEST 1 VIEW COMPARISON:  03/03/2018 FINDINGS: 0538 hours. Endotracheal tube tip is 5.1 cm above the base of the carina. NG tube passes into the stomach. Right PICC line tip overlies the distal  SVC level. Lung volumes are low. The cardio pericardial silhouette is enlarged. There is pulmonary vascular congestion without overt pulmonary edema. Basilar atelectasis noted bilaterally with tiny bilateral pleural effusions. IMPRESSION: Low lung volumes with cardiomegaly, vascular congestion and tiny effusions. Electronically Signed   By: Misty Stanley M.D.   On: 03/04/2018 08:39   Dg Chest Port 1 View  Result Date: 03/03/2018 CLINICAL DATA:  Pulmonary edema. EXAM: PORTABLE CHEST 1 VIEW COMPARISON:  Radiograph of March 02, 2018. FINDINGS: Stable cardiomediastinal silhouette. Atherosclerosis of thoracic aorta is noted. Endotracheal and nasogastric tubes are unchanged in position. Right-sided PICC line is unchanged in position. Stable hypoinflation of the lungs is noted with associated bibasilar atelectasis and small pleural effusions. No pneumothorax is noted. Severe degenerative changes seen involving the left glenohumeral joint. IMPRESSION: Stable support apparatus. Stable hypoinflation of the lungs with mild bibasilar subsegmental atelectasis and small pleural effusions. Aortic Atherosclerosis (ICD10-I70.0). Electronically Signed   By: Marijo Conception, M.D.   On: 03/03/2018 08:27   Dg Chest Port 1 View  Result Date: 03/02/2018 CLINICAL DATA:  Hypoxia  EXAM: PORTABLE CHEST 1 VIEW COMPARISON:  March 01, 2018 FINDINGS: Endotracheal tube tip is 3.7 cm above the carina. Nasogastric tube tip and side port are in the stomach. Right subclavian catheter tip is at the cavoatrial junction. No pneumothorax. There is cardiomegaly with pulmonary venous hypertension. There are small pleural effusions bilaterally. There is atelectatic change in the lung bases. A degree of consolidation in the left base is questioned. There is advanced arthropathy in the left shoulder. IMPRESSION: Tube and catheter positions as described without pneumothorax. Pulmonary vascular congestion present with small pleural effusions  bilaterally. There is bibasilar atelectasis with a questionable degree of consolidation in the left base. Aortic Atherosclerosis (ICD10-I70.0). Electronically Signed   By: Lowella Grip III M.D.   On: 03/02/2018 07:38   Dg Chest Port 1 View  Result Date: 03/01/2018 CLINICAL DATA:  Ventilator dependent respiratory failure. Follow-up pulmonary edema. EXAM: PORTABLE CHEST 1 VIEW COMPARISON:  02/28/2018, 02/28/2016 and earlier. FINDINGS: Endotracheal tube tip in satisfactory position projecting approximately 3-4 cm above the carina. RIGHT arm PICC tip projects at or near the cavoatrial junction, unchanged. Nasogastric tube looped in the stomach with its tip in the fundus. Markedly suboptimal inspiration with worsening atelectasis in the lung bases, LEFT greater than RIGHT. Resolution of interstitial pulmonary edema. Persistent pulmonary venous hypertension. IMPRESSION: 1.  Support apparatus satisfactory. 2. Resolution of interstitial pulmonary edema since yesterday, though pulmonary venous hypertension persists. 3. Worsening bibasilar atelectasis, LEFT greater than RIGHT. Electronically Signed   By: Evangeline Dakin M.D.   On: 03/01/2018 09:01   Dg Chest Port 1 View  Result Date: 02/28/2018 CLINICAL DATA:  Intubation. EXAM: PORTABLE CHEST 1 VIEW COMPARISON:  02/26/2018 FINDINGS: The endotracheal tube is 3.3 cm above the carina. There is an NG tube coursing down the esophagus and into the stomach. The right-sided PICC line is stable. Significant worsening lung aeration with very low lung volumes, vascular crowding, pulmonary edema and basilar atelectasis. The heart remains enlarged and there is tortuosity and calcification of the thoracic aorta. IMPRESSION: 1. The endotracheal tube and NG tubes are in good position. 2. Very low lung volumes with vascular crowding and atelectasis 3. Vascular congestion and pulmonary edema. Electronically Signed   By: Marijo Sanes M.D.   On: 02/28/2018 16:37   Dg Chest Port  1 View  Result Date: 02/26/2018 CLINICAL DATA:  84 year old female with intermittent productive cough EXAM: PORTABLE CHEST 1 VIEW COMPARISON:  02/28/2016 FINDINGS: Cardiomediastinal silhouette unchanged. Interval development of asymmetric elevation the right hemidiaphragm. No pneumothorax. Low lung volumes with coarsened interstitial markings and interlobular septal thickening. No large pleural effusion. No confluent airspace disease. Interval placement of right upper extremity PICC with the tip appearing to terminate superior vena cava. Degenerative changes of the left glenohumeral joint. IMPRESSION: Low lung volumes with questionable edema. New asymmetric elevation the right hemidiaphragm, uncertain significance. Right upper extremity PICC. Electronically Signed   By: Corrie Mckusick D.O.   On: 02/26/2018 19:28   [place] Ekg Site Rite  Result Date: 02/22/2018 If Site Rite image not attached, placement could not be confirmed due to current cardiac rhythm.   Discharge Exam: Vitals:   03/12/18 2209 03/13/18 0540  BP: (!) 115/46 (!) 120/47  Pulse: 73 70  Resp: (!) 24 20  Temp: 98.7 F (37.1 C) 98.9 F (37.2 C)  SpO2: 100% 100%   Vitals:   03/12/18 1422 03/12/18 1935 03/12/18 2209 03/13/18 0540  BP: (!) 118/46  (!) 115/46 (!) 120/47  Pulse: 70  73  70  Resp: 18  (!) 24 20  Temp: 98.2 F (36.8 C)  98.7 F (37.1 C) 98.9 F (37.2 C)  TempSrc: Oral  Oral Oral  SpO2: 99% 98% 100% 100%  Weight:    111.6 kg  Height:        General: Pt is alert, awake, not in acute distress Cardiovascular: RRR, S1/S2 +, no rubs, no gallops Respiratory: CTA bilaterally, no wheezing, no rhonchi Abdominal: Soft, NT, ND, bowel sounds + colostomy present with brown liquid stool.  Incisions are clean dry and intact. Extremities: no edema, no cyanosis    The results of significant diagnostics from this hospitalization (including imaging, microbiology, ancillary and laboratory) are listed below for reference.      Microbiology: No results found for this or any previous visit (from the past 240 hour(s)).   Labs: BNP (last 3 results) Recent Labs    02/27/18 1600  BNP 456.2*   Basic Metabolic Panel: Recent Labs  Lab 03/07/18 0526 03/08/18 0611 03/09/18 0610 03/10/18 0434 03/12/18 0412 03/13/18 0420  NA 141 140 139 138 137 136  K 3.5 3.5 3.5 3.8 4.2 4.5  CL 103 99 99 97* 96* 96*  CO2 31 31 32 33* 33* 32  GLUCOSE 120* 131* 120* 115* 141* 124*  BUN 43* 37* 34* 32* 39* 43*  CREATININE 1.31* 1.21* 1.24* 1.25* 1.33* 1.24*  CALCIUM 8.2* 8.3* 8.3* 8.4* 8.2* 8.6*  MG 1.8  --   --   --   --   --   PHOS 3.3 3.1 3.2  --   --   --    Liver Function Tests: Recent Labs  Lab 03/07/18 0526 03/08/18 0611 03/09/18 0610 03/10/18 0434  AST  --   --   --  15  ALT  --   --   --  9  ALKPHOS  --   --   --  41  BILITOT  --   --   --  0.7  PROT  --   --   --  4.3*  ALBUMIN 2.0* 2.0* 2.0* 2.0*   No results for input(s): LIPASE, AMYLASE in the last 168 hours. No results for input(s): AMMONIA in the last 168 hours. CBC: Recent Labs  Lab 03/07/18 0526 03/08/18 0611 03/10/18 0434 03/12/18 0412 03/13/18 0420  WBC 15.7* 18.8* 13.5* 10.4 7.5  HGB 9.0* 9.4* 8.7* 7.5* 7.1*  HCT 29.1* 30.2* 27.9* 24.7* 23.2*  MCV 94.8 95.0 96.5 96.5 97.9  PLT 297 305 263 174 148*   Cardiac Enzymes: No results for input(s): CKTOTAL, CKMB, CKMBINDEX, TROPONINI in the last 168 hours. BNP: Invalid input(s): POCBNP CBG: Recent Labs  Lab 03/12/18 1103 03/12/18 1600 03/12/18 2009 03/13/18 0017 03/13/18 0418  GLUCAP 126* 160* 145* 148* 112*   D-Dimer No results for input(s): DDIMER in the last 72 hours. Hgb A1c No results for input(s): HGBA1C in the last 72 hours. Lipid Profile No results for input(s): CHOL, HDL, LDLCALC, TRIG, CHOLHDL, LDLDIRECT in the last 72 hours. Thyroid function studies No results for input(s): TSH, T4TOTAL, T3FREE, THYROIDAB in the last 72 hours.  Invalid input(s): FREET3 Anemia  work up No results for input(s): VITAMINB12, FOLATE, FERRITIN, TIBC, IRON, RETICCTPCT in the last 72 hours. Urinalysis    Component Value Date/Time   COLORURINE YELLOW 02/26/2018 1712   APPEARANCEUR CLEAR 02/26/2018 1712   LABSPEC 1.014 02/26/2018 1712   PHURINE 8.0 02/26/2018 1712   GLUCOSEU NEGATIVE 02/26/2018 1712   HGBUR SMALL (A) 02/26/2018 1712  BILIRUBINUR NEGATIVE 02/26/2018 1712   KETONESUR NEGATIVE 02/26/2018 1712   PROTEINUR NEGATIVE 02/26/2018 1712   UROBILINOGEN 0.2 04/21/2014 1200   NITRITE NEGATIVE 02/26/2018 1712   LEUKOCYTESUR NEGATIVE 02/26/2018 1712   Sepsis Labs Invalid input(s): PROCALCITONIN,  WBC,  LACTICIDVEN Microbiology No results found for this or any previous visit (from the past 240 hour(s)).   Time coordinating discharge: 35 minutes  SIGNED:   Rodena Goldmann, DO [facility] 03/13/2018, 7:14 AM  If 7PM-7AM, please contact night-coverage [email or web address] Password [email or web address]

## 2018-03-13 NOTE — Progress Notes (Signed)
PICC line discontinued,per MD order's. Discharged to [place] called and given to [facility] LPN. Family at the bedside. Accompanied by staff to an awaiting floor.

## 2018-03-13 NOTE — Progress Notes (Signed)
3 Days Post-Op  Subjective: Patient has no complaints.  Resting comfortably.  Objective: Vital signs in last 24 hours: Temp:  [98.2 F (36.8 C)-98.9 F (37.2 C)] 98.9 F (37.2 C) (02/27 0540) Pulse Rate:  [70-73] 70 (02/27 0540) Resp:  [18-24] 20 (02/27 0540) BP: (115-120)/(46-47) 120/47 (02/27 0540) SpO2:  [98 %-100 %] 100 % (02/27 0540) Weight:  [111.6 kg] 111.6 kg (02/27 0540) Last BM Date: 03/10/18  Intake/Output from previous day: 02/26 0701 - 02/27 0700 In: 120 [P.O.:120] Out: 1450 [Urine:1450] Intake/Output this shift: No intake/output data recorded.  General appearance: alert, cooperative and no distress GI: Soft, ostomy patent.  Wound VAC in place.  Lab Results:  Recent Labs    03/12/18 0412 03/13/18 0420  WBC 10.4 7.5  HGB 7.5* 7.1*  HCT 24.7* 23.2*  PLT 174 148*   BMET Recent Labs    03/12/18 0412 03/13/18 0420  NA 137 136  K 4.2 4.5  CL 96* 96*  CO2 33* 32  GLUCOSE 141* 124*  BUN 39* 43*  CREATININE 1.33* 1.24*  CALCIUM 8.2* 8.6*   PT/INR No results for input(s): LABPROT, INR in the last 72 hours.  Studies/Results: No results found.  Anti-infectives: Anti-infectives (From admission, onward)   Start     Dose/Rate Route Frequency Ordered Stop   03/11/18 1200  fluconazole (DIFLUCAN) tablet 200 mg     200 mg Oral Daily 03/11/18 1138 03/15/18 0959   03/06/18 1300  fluconazole (DIFLUCAN) IVPB 200 mg  Status:  Discontinued     200 mg 100 mL/hr over 60 Minutes Intravenous Every 24 hours 03/05/18 1216 03/11/18 1138   03/05/18 1300  fluconazole (DIFLUCAN) IVPB 400 mg     400 mg 100 mL/hr over 120 Minutes Intravenous  Once 03/05/18 1216 03/05/18 1523   03/03/18 1700  meropenem (MERREM) 1 g in sodium chloride 0.9 % 100 mL IVPB  Status:  Discontinued     1 g 200 mL/hr over 30 Minutes Intravenous Every 12 hours 03/03/18 1000 03/03/18 1507   03/03/18 1700  ertapenem (INVANZ) 1,000 mg in sodium chloride 0.9 % 100 mL IVPB     1 g 200 mL/hr over 30  Minutes Intravenous Every 24 hours 03/03/18 1507     03/03/18 1600  linezolid (ZYVOX) IVPB 600 mg     600 mg 300 mL/hr over 60 Minutes Intravenous Every 12 hours 03/03/18 1507     03/02/18 1800  ertapenem (INVANZ) 1,000 mg in sodium chloride 0.9 % 100 mL IVPB  Status:  Discontinued     1 g 200 mL/hr over 30 Minutes Intravenous Every 24 hours 03/02/18 0829 03/03/18 0959   02/28/18 1800  ertapenem (INVANZ) 500 mg in sodium chloride 0.9 % 50 mL IVPB  Status:  Discontinued     500 mg 100 mL/hr over 30 Minutes Intravenous Every 24 hours 02/28/18 1035 03/02/18 0829   02/27/18 1800  ertapenem (INVANZ) 1,000 mg in sodium chloride 0.9 % 100 mL IVPB  Status:  Discontinued     1 g 200 mL/hr over 30 Minutes Intravenous Every 24 hours 02/27/18 1705 02/28/18 1035   02/21/18 0800  ciprofloxacin (CIPRO) IVPB 400 mg  Status:  Discontinued     400 mg 200 mL/hr over 60 Minutes Intravenous Every 12 hours 02/20/18 2204 02/27/18 1908   02/21/18 0500  metroNIDAZOLE (FLAGYL) IVPB 500 mg  Status:  Discontinued     500 mg 100 mL/hr over 60 Minutes Intravenous Every 8 hours 02/20/18 2204 02/27/18 1908  02/20/18 2115  ciprofloxacin (CIPRO) IVPB 400 mg     400 mg 200 mL/hr over 60 Minutes Intravenous  Once 02/20/18 2113 02/20/18 2335   02/20/18 2115  metroNIDAZOLE (FLAGYL) tablet 500 mg     500 mg Oral  Once 02/20/18 2113 02/20/18 2147      Assessment/Plan: s/p Procedure(s): EXPLORATORY LAPAROTOMY SECONDARY CLOSURE OF WOUND APPLICATION OF WOUND VAC Impression: Stable on postoperative day 3.  Patient okay for discharge to skilled nursing from surgery standpoint.  I will be more than happy to follow-up with the patient to follow her wound closure as needed.  LOS: 21 days    [name] 03/13/2018

## 2018-03-13 NOTE — Clinical Social Work Placement (Signed)
   CLINICAL SOCIAL WORK PLACEMENT  NOTE  Date:  03/13/2018  Patient Details  Name: Regina Hall MRN: [id number] Date of Birth: 1934/09/30  Clinical Social Work is seeking post-discharge placement for this patient at the [place] level of care (*CSW will initial, date and re-position this form in  chart as items are completed):  Yes   Patient/family provided with [facility] Work Department's list of facilities offering this level of care within the geographic area requested by the patient (or if unable, by the patient's family).  Yes   Patient/family informed of their freedom to choose among providers that offer the needed level of care, that participate in Medicare, Medicaid or managed care program needed by the patient, have an available bed and are willing to accept the patient.  Yes   Patient/family informed of [facility]'s ownership interest in [facility] and [facility], as well as of the fact that they are under no obligation to receive care at these facilities.  PASRR submitted to EDS on 02/25/18     PASRR number received on       Existing PASRR number confirmed on       FL2 transmitted to all facilities in geographic area requested by pt/family on 02/25/18     FL2 transmitted to all facilities within larger geographic area on       Patient informed that his/her managed care company has contracts with or will negotiate with certain facilities, including the following:        Yes   Patient/family informed of bed offers received.  Patient chooses bed at [facility]     Physician recommends and patient chooses bed at      Patient to be transferred to [facility] on 03/13/18.  Patient to be transferred to facility by aph staff     Patient family notified on 03/13/18 of transfer.  Name of family member notified:  sister, Quita Skye. (Spouse did not answer)     PHYSICIAN       Additional Comment:  LCSW signing  off. Discharge clinicals sent to facility.   _______________________________________________ Ihor Gully, LCSW 03/13/2018, 2:19 PM

## 2018-03-14 ENCOUNTER — Non-Acute Institutional Stay (SKILLED_NURSING_FACILITY): Payer: Medicare Other | Admitting: Adult Health

## 2018-03-14 ENCOUNTER — Encounter: Payer: Self-pay | Admitting: Adult Health

## 2018-03-14 ENCOUNTER — Other Ambulatory Visit: Payer: Self-pay | Admitting: Adult Health

## 2018-03-14 DIAGNOSIS — I82501 Chronic embolism and thrombosis of unspecified deep veins of right lower extremity: Secondary | ICD-10-CM | POA: Diagnosis not present

## 2018-03-14 DIAGNOSIS — N183 Chronic kidney disease, stage 3 unspecified: Secondary | ICD-10-CM

## 2018-03-14 DIAGNOSIS — K56609 Unspecified intestinal obstruction, unspecified as to partial versus complete obstruction: Secondary | ICD-10-CM

## 2018-03-14 DIAGNOSIS — I10 Essential (primary) hypertension: Secondary | ICD-10-CM

## 2018-03-14 DIAGNOSIS — E43 Unspecified severe protein-calorie malnutrition: Secondary | ICD-10-CM | POA: Insufficient documentation

## 2018-03-14 DIAGNOSIS — I878 Other specified disorders of veins: Secondary | ICD-10-CM

## 2018-03-14 DIAGNOSIS — K219 Gastro-esophageal reflux disease without esophagitis: Secondary | ICD-10-CM | POA: Insufficient documentation

## 2018-03-14 DIAGNOSIS — T8131XS Disruption of external operation (surgical) wound, not elsewhere classified, sequela: Secondary | ICD-10-CM

## 2018-03-14 DIAGNOSIS — Z933 Colostomy status: Secondary | ICD-10-CM

## 2018-03-14 DIAGNOSIS — J3089 Other allergic rhinitis: Secondary | ICD-10-CM

## 2018-03-14 DIAGNOSIS — D62 Acute posthemorrhagic anemia: Secondary | ICD-10-CM

## 2018-03-14 DIAGNOSIS — I4891 Unspecified atrial fibrillation: Secondary | ICD-10-CM

## 2018-03-14 DIAGNOSIS — K5720 Diverticulitis of large intestine with perforation and abscess without bleeding: Secondary | ICD-10-CM

## 2018-03-14 MED ORDER — OXYCODONE-ACETAMINOPHEN 5-325 MG PO TABS: 1.0000 | ORAL_TABLET | Freq: Four times a day (QID) | ORAL | 0 refills | Status: AC | PRN

## 2018-03-14 NOTE — Progress Notes (Addendum)
Location:   [facility] Room Number: 129 P Place of Service:  SNF (31)   CODE STATUS: Full Code  Allergies  Allergen Reactions  . Penicillins Rash and Other (See Comments)    Has patient had a PCN reaction causing immediate rash, facial/tongue/throat swelling, SOB or lightheadedness with hypotension: ###Yes## Has patient had a PCN reaction causing severe rash involving mucus membranes or skin necrosis: No Has patient had a PCN reaction that required hospitalization No Has patient had a PCN reaction occurring within the last 10 years: No If all of the above answers are "NO", then may proceed with Cephalosporin use.   . Vancomycin     Itching, SOB     Chief Complaint  Patient presents with  . Hospitalization Follow-up    Hospital Follow up    HPI:  She is an 84 year old woman who went to the ED due to abdominal pain. The Ct scan demonstrated diverticulitis and large amount of stool present; concerning for obstruction. She developed a fever; with a repeat ct scan demonstrating free air. She underwent a hartmann's procedures on 02-28-18.  She developed new onset afib; and was started on amiodarone on 03-10-18. She had to revisit OR for an exploration and peritoneal wash reclosure of of her wound; she presently has a wound vac in place. She is here for short term rehab and wound management. The goal of her care at this time for her to return back home. She denies any uncontrolled pain;no changes in appetite; no cough or shortness of breath. She denies any chest pain or palpitations. She will continue to be followed for her chronic illnesses including: hypertension; dvt; ckd stage 3.    Past Medical History:  Diagnosis Date  . Anemia   . Anxiety    pt denies  . Arthritis    KNEES AND BACK  . Bladder incontinence   . Cancer ([place])    basal cell cancer on nose  . Edema of both feet   . Hypertension   . Pneumonia   . PONV (postoperative nausea and  vomiting)    PONV X 1 EPISODE, COMES OUT OF ANESTHESIA FAST, SOME AWARENESS DURING END OF COLONSCOPY  . Scoliosis   . VRE (vancomycin resistant enterococcus) culture positive     Past Surgical History:  Procedure Laterality Date  . ABDOMINAL HYSTERECTOMY     2006 VAG HYST  . ABDOMINAL SURGERY    . APPLICATION OF WOUND VAC  03/10/2018   Procedure: APPLICATION OF WOUND VAC;  Surgeon: [name], MD;  Location: [facility];  Service: General;;  . BACK SURGERY     LOWER, X2  . BIOPSY Right 05/02/2017   Procedure: BIOPSY OF RIGHT UPPER EYELID LESION;  Surgeon: [name], MD;  Location: [facility];  Service: Ophthalmology;  Laterality: Right;  . COLECTOMY WITH COLOSTOMY CREATION/HARTMANN PROCEDURE N/A 02/28/2018   Procedure: PARTIAL COLECTOMY WITH COLOSTOMY;  Surgeon: [name], MD;  Location: [facility];  Service: General;  Laterality: N/A;  . EYE SURGERY Bilateral    cataract surgery with lens implant  . FLEXIBLE SIGMOIDOSCOPY N/A 02/21/2018   Procedure: FLEXIBLE SIGMOIDOSCOPY;  Surgeon: [name], MD;  [facility];  Service: Endoscopy;  Laterality: N/A;  . JOINT REPLACEMENT     2011 LT HIP  . LAPAROTOMY N/A 03/10/2018   Procedure: EXPLORATORY LAPAROTOMY;  Surgeon: [name], MD;  Location: [facility];  Service: General;  Laterality: N/A;  . POLYPECTOMY  02/21/2018   Procedure: POLYPECTOMY;  Surgeon: [name], MD;  [facility];  Service: Endoscopy;;  rectum  . RECONSTRUCTION OF EYELID Right 05/02/2017   Procedure: TOTAL RECONSTRUCTION OF UPPER EYELID RIGHT EYE WITH FULL THICKNESS SKIN GRAFT FROM RIGHT POSTERIOR EAR;  Surgeon: [name], MD;  Location: [facility];  Service: Ophthalmology;  Laterality: Right;  . SECONDARY CLOSURE OF WOUND  03/10/2018   Procedure: SECONDARY CLOSURE OF WOUND;  Surgeon: [name], MD;  Location: [facility];  Service: General;;  . TONSILLECTOMY    . TOTAL KNEE ARTHROPLASTY Right 04/27/2014   Procedure: [place] TOTAL KNEE  ARTHROPLASTY;  Surgeon: Rod Can, MD;  Location: [facility];  Service: Orthopedics;  Laterality: Right;  . TUBAL LIGATION     1974  . VENA CAVA FILTER PLACEMENT N/A 06/04/2017   Procedure: INSERTION VENA-CAVA FILTER;  Surgeon: Angelia Mould, MD;  Location: [facility] OR;  Service: Vascular;  Laterality: N/A;    Social History   Socioeconomic History  . Marital status: Married    Spouse name: Not on file  . Number of children: 3  . Years of education: 59  . Highest education level: Associate degree: occupational, Hotel manager, or vocational program  Occupational History  . Occupation: Retired    Comment: Corporate treasurer  Social Needs  . Financial resource strain: Somewhat hard  . Food insecurity:    Worry: Never true    Inability: Never true  . Transportation needs:    Medical: No    Non-medical: No  Tobacco Use  . Smoking status: Never Smoker  . Smokeless tobacco: Never Used  Substance and Sexual Activity  . Alcohol use: Yes    Comment: OCC WINE  . Drug use: No  . Sexual activity: Not on file  Lifestyle  . Physical activity:    Days per week: 0 days    Minutes per session: 0 min  . Stress: Only a little  Relationships  . Social connections:    Talks on phone: More than three times a week    Gets together: Once a week    Attends religious service: Never    Active member of club or organization: No    Attends meetings of clubs or organizations: Never    Relationship status: Married  . Intimate partner violence:    Fear of current or ex partner: No    Emotionally abused: No    Physically abused: No    Forced sexual activity: No  Other Topics Concern  . Not on file  Social History Narrative  . Not on file   Family History  Problem Relation Age of Onset  . Congestive Heart Failure Mother   . Colon cancer Father   . Cancer Brother   . Arthritis Sister   . Asthma Sister   . Diabetes Son   . Diabetes Son       VITAL SIGNS BP 125/60   Pulse 68   Temp  (!) 97.4 F (36.3 C)   Ht 5\' 6"  (1.676 m)   Wt 246 lb 1 oz (111.6 kg)   BMI 39.72 kg/m   Outpatient Encounter Medications as of 03/14/2018  Medication Sig  . Amino Acids-Protein Hydrolys (FEEDING SUPPLEMENT, PRO-STAT SUGAR FREE 64,) LIQD Take 30 mLs by mouth 3 (three) times daily.  Marland Kitchen amiodarone (PACERONE) 200 MG tablet Take 1 tablet (200 mg total) by mouth 2 (two) times daily for 30 days.  Marland Kitchen aspirin EC 81 MG tablet Take 81 mg  by mouth daily.  . Biotin 1 MG CAPS Take by mouth.  . Cholecalciferol (VITAMIN D3) 5000 units TABS Take 5,000 Units by mouth daily.  . Cranberry 400 MG CAPS Take 1 capsule by mouth daily.   . feeding supplement, ENSURE ENLIVE, (ENSURE ENLIVE) LIQD Take 237 mLs by mouth 2 (two) times daily between meals.  . furosemide (LASIX) 40 MG tablet Take 1 tablet (40 mg total) by mouth daily for 30 days.  . Glucosamine Sulfate 500 MG TABS Take 2 tablets by mouth daily.   . Hypromellose (ARTIFICIAL TEARS OP) Place 2 drops into both eyes daily as needed (for dry eyes).  . montelukast (SINGULAIR) 10 MG tablet Take 10 mg by mouth every morning.  . Multiple Vitamin (MULTIVITAMIN WITH MINERALS) TABS tablet Take 1 tablet by mouth daily for 30 days.  . NON FORMULARY Diet Type:  NAS  . nutrition supplement, JUVEN, (JUVEN) PACK Take 1 packet by mouth 2 (two) times daily between meals for 30 days.  Marland Kitchen oxyCODONE-acetaminophen (PERCOCET/ROXICET) 5-325 MG tablet Take 1 tablet by mouth every 6 (six) hours as needed for up to 3 days for moderate pain.  . pantoprazole (PROTONIX) 40 MG tablet Take 1 tablet (40 mg total) by mouth daily at 12 noon for 30 days.  . potassium chloride SA (K-DUR,KLOR-CON) 20 MEQ tablet Take 2 tablets (40 mEq total) by mouth daily for 30 days.  . vitamin B-12 (CYANOCOBALAMIN) 500 MCG tablet Take 500 mcg by mouth daily.  . Chlorhexidine Gluconate Cloth 2 % PADS Apply 6 each topically daily for 30 days.   Facility-Administered Encounter Medications as of 03/14/2018    Medication  . ondansetron (ZOFRAN) 4 mg in sodium chloride 0.9 % 50 mL IVPB     SIGNIFICANT DIAGNOSTIC EXAMS  TODAY;   02-20-18: ct of abdomen and pelvis:  1. Large amount of stool throughout the colon to the level of the distal sigmoid colon where there is a short segment of concentric, medium density wall thickening. This is concerning for an obstructing short segment mass. It would be unusual for edema due to diverticulitis to involve that short of a segment of bowel. Correlation with sigmoidoscopy or colonoscopy is recommended. 2. Mild pericolonic soft tissue stranding involving the distal sigmoid colon, most pronounced at the level of maximal distension of the colon by the stool. This is suspicious for mild diverticulitis without abscess. There is minimal diverticulosis in that region of the colon. 3. Cholelithiasis. 4. 5 mm probable hemangioma in the spleen.  02-27-18: ct of abdomen and pelvis:  1. New small amount of free intraperitoneal air, consistent with bowel perforation. 2. Large colonic stool burden with transition point near the rectosigmoid junction, consistent with stricture. No definite soft tissue mass appreciated by CT. This is unchanged in appearance compared to previous study. Recommend correlation with colonoscopy to exclude obstructing colon carcinoma. 3. Mild diverticulitis of proximal sigmoid colon, without significant change. 4. New mild ascites and diffuse body wall edema. New small bilateral pleural effusions and bibasilar atelectasis. 5. Cholelithiasis. No radiographic evidence of cholecystitis.   03-03-18: 2-d echo:   Left Ventricle: The left ventricle has normal systolic function, with an ejection fraction of 60-65%. The cavity size was normal. There is mildly increased left ventricular wall thickness. Left ventricular diastolic parameters were normal Right Ventricle: The right ventricle has normal systolic function. The cavity was normal. There is no increase  in right ventricular wall thickness.  03-04-18; chest x-ray: Low lung volumes with cardiomegaly, vascular congestion and  tiny Effusions.  LABS REVIEWED TODAY;   02-20-18: wbc 11.8; hgb 12.6; hct 40.4 mcv 95.7 plt 187; glucose 145; bun 23; creat 1.06 ;k+ 3.6; na++ 138; ca 9.1 liver normal albumin  02-26-18; wbc 16.1; hgb 11.2; hct 34.3; mcv 94.2; plt 221; glucose 118;bun 14; creat 0.90; k+ 4.0; na++ 134; ca 8.3  blood culture/urine culture: no growth flu Neg 02-27-18: BNP 325.0  03-01-18: wbc 25.0; hgb 10.4; hct 31.7; mcv 94.;3 plt 367; glucose 162; bun 40; creat 1.95; k+ 4.3; na++ 134; ca 7.7 mag  2.4; phos 4.0 03-03-18: wbc 17.0; hgb 8.4; hct 26.3; mcv 92.6 plt 222; glucose 148; bun 53; creat 1.53; k+ 4.0; na++ 139; ca  8.3; protein 4.3; albumin 1.8; liver normal  03-06-18: wbc 17.0; hgb 8.5; hct 26.7; mcv 93.0; plt 278; glucose 184; bun 48; creat 1.38; k+ 3.2; an++ 139; ca 8.0 phos 3.1; albumin 2.0  03-10-18: wbc 13.5; hgb 8.7; hct 27.9; mcv 96.5 plt 263; glucose 115; bun 32; creat 1.25; k+ 3.8; na++ 138; ca 8.4 total protein 4.3; albumin 2.0 liver normal  03-13-18: wbc 7.5; hgb 7.1; hct 23.2; mcv 97.9; plt 148 glucose 124; bun 43; creat 1.24; k+ 4.5; na++ 136; ca 8.6   Review of Systems  Constitutional: Negative for malaise/fatigue.  Respiratory: Negative for cough and shortness of breath.   Cardiovascular: Negative for chest pain, palpitations and leg swelling.  Gastrointestinal: Negative for abdominal pain, constipation and heartburn.       Has colostomy  Musculoskeletal: Negative for back pain, joint pain and myalgias.  Skin:       Has wound vac on abdominal wound   Neurological: Negative for dizziness.  Psychiatric/Behavioral: The patient is not nervous/anxious.     Physical Exam Constitutional:      General: She is not in acute distress.    Appearance: She is well-developed. She is obese. She is not diaphoretic.  Neck:     Musculoskeletal: Neck supple.     Thyroid: No thyromegaly.    Cardiovascular:     Rate and Rhythm: Normal rate and regular rhythm.     Pulses: Normal pulses.     Heart sounds: Normal heart sounds.     Comments: History of IVC filter placement  Pulmonary:     Effort: Pulmonary effort is normal. No respiratory distress.     Breath sounds: Normal breath sounds.  Abdominal:     General: There is no distension.     Palpations: Abdomen is soft.     Tenderness: There is no abdominal tenderness.     Comments: Bowel sounds hypoactive all four quads Colostomy present   Musculoskeletal:     Right lower leg: Edema present.     Left lower leg: Edema present.     Comments: 2+ bilateral lower extremity edema Is able to move upper extremities-weakness present Did not move lower extremities  History of left hip replacement 201 and right knee replacement 2019   Lymphadenopathy:     Cervical: No cervical adenopathy.  Skin:    General: Skin is warm and dry.     Comments: Abdominal mid line incision line without signs of infection present; wound vac in place  Bilateral lower extremities discolored   Neurological:     Mental Status: She is alert and oriented to person, place, and time.  Psychiatric:        Mood and Affect: Mood normal.       ASSESSMENT/ PLAN:  TODAY:   1. Essential hypertension: is  stable b/p 125/60: will continue to monitor   2. Chronic deep vein thrombosis (DVT) of right lower extremity unspecified vein: is stable is status post IVC filter placement; will continue asa 81 mg daily   3. Chronic kidney disease stage 3 (moderate): is stable bun 43; creat 1.24 will monitor   4. Wound dehiscence surgical; sequela: is stable has wound vac in place will monitor  5. New onset atrial fibrillation: heart rate is stable will continue amiodarone 200 mg twice daily for rate control and asa 81 mg daily   6. Acute blood loss anemia: is without change: hgb 7.1; will continue to monitor   7. Chronic non-seasonal allergic rhinitis: is stable  will continue singulair 20 mg daily   8. GERD without esophagitis: is stable will continue protonix 40 mg daily   9. Severe protein calorie malnutrition: is without change: total protein 4.3; albumin 2.0; will continue prostat 30 cc three times daily; ensure twice daily and juven twice daily   10. Venous stasis of both lower extremities: is without change will continue lasix 40 mg daily with k+ 40 meq daily   11. Large bowel obstruction/diverticulitis of large intestine with perforation. Is status post exploratory lap with colostomy: is stable will continue wound care as indicated and will continue therapy as directed to improve upon independence with her adls.   Will check cbc bmp on 03-17-18    MD is aware of resident's narcotic use and is in agreement with current plan of care. We will attempt to wean resident as apropriate   Ok Edwards NP [facility]  Contact [phone] Monday through Friday 8am- 5pm  After hours call [phone]

## 2018-03-17 ENCOUNTER — Encounter (HOSPITAL_COMMUNITY)
Admission: RE | Admit: 2018-03-17 | Discharge: 2018-03-17 | Disposition: A | Discharge status: Home / Self Care | Payer: Medicare Other | Source: Skilled Nursing Facility | Attending: Internal Medicine | Admitting: Internal Medicine

## 2018-03-17 DIAGNOSIS — Z433 Encounter for attention to colostomy: Secondary | ICD-10-CM | POA: Insufficient documentation

## 2018-03-17 DIAGNOSIS — T8132XD Disruption of internal operation (surgical) wound, not elsewhere classified, subsequent encounter: Secondary | ICD-10-CM | POA: Insufficient documentation

## 2018-03-17 DIAGNOSIS — Z978 Presence of other specified devices: Secondary | ICD-10-CM | POA: Insufficient documentation

## 2018-03-17 LAB — BASIC METABOLIC PANEL
Anion gap: 6 (ref 5–15)
BUN: 52 mg/dL — AB (ref 8–23)
CO2: 31 mmol/L (ref 22–32)
Calcium: 9.3 mg/dL (ref 8.9–10.3)
Chloride: 103 mmol/L (ref 98–111)
Creatinine, Ser: 1.08 mg/dL — ABNORMAL HIGH (ref 0.44–1.00)
GFR calc Af Amer: 56 mL/min — ABNORMAL LOW (ref >60)
GFR calc non Af Amer: 48 mL/min — ABNORMAL LOW (ref >60)
Glucose, Bld: 115 mg/dL — ABNORMAL HIGH (ref 70–99)
Potassium: 4.4 mmol/L (ref 3.5–5.1)
Sodium: 140 mmol/L (ref 135–145)

## 2018-03-20 ENCOUNTER — Encounter: Payer: Self-pay | Admitting: Internal Medicine

## 2018-03-20 ENCOUNTER — Non-Acute Institutional Stay (SKILLED_NURSING_FACILITY): Payer: Medicare Other | Admitting: Internal Medicine

## 2018-03-20 DIAGNOSIS — I4891 Unspecified atrial fibrillation: Secondary | ICD-10-CM

## 2018-03-20 DIAGNOSIS — D62 Acute posthemorrhagic anemia: Secondary | ICD-10-CM

## 2018-03-20 DIAGNOSIS — T8131XS Disruption of external operation (surgical) wound, not elsewhere classified, sequela: Secondary | ICD-10-CM

## 2018-03-20 DIAGNOSIS — N183 Chronic kidney disease, stage 3 unspecified: Secondary | ICD-10-CM

## 2018-03-20 NOTE — Progress Notes (Addendum)
Provider:  Veleta Miners, MD Location:  [facility] Room Number: 129 P Place of Service:  SNF (31)  PCP: [name], DO Patient Care Team: [name], DO as PCP - General (Family Medicine) Angelia Mould, MD as Consulting Physician (Vascular Surgery)  Extended Emergency Contact Information Primary Emergency Contact: Thomas,Virgil Address: [address] [place] [address] Johnnette Litter of [place] Phone: [phone] Mobile Phone: [phone] Relation: Spouse Secondary Emergency Contact: Shaver,Timothy          [place], [place] of [place] Phone: [phone] Relation: Son  Code Status: Full Code Goals of Care: Advanced Directive information Advanced Directives 03/20/2018  Does Patient Have a Medical Advance Directive? No  Type of Advance Directive -  Does patient want to make changes to medical advance directive? -  Copy of [place] in Chart? -  Would patient like information on creating a medical advance directive? No - Patient declined      Chief Complaint  Patient presents with  . New Admit To SNF    Admission    HPI: Patient is a 84 y.o. female seen today for admission to SNF for therapy. Patient was in the hospital from 02/06-02/27 for bowel Obstruction, s/p Hartmans procedure  And Wound Dehiscence  Patient has h/o Hypertension, DVT s/P IVC filter, Chronic back Pain, LE swelling with Left Foot Venous Ulcer,   She came to ED for Abdominal pain and Constipation. She was found to have Colonic Obstruction on CT scan. She underwent sigmoidoscopy which showed obstruction in the sigmoid colon with market edema.  Repeat CT scan showed free air.  She underwent Hartman's procedure on  2/14.for  Perforation and Fecal peritonitis.She a developed hypotension and new onset A. fib after her surgery.  She converted to NSR on amiodarone. She also needed Pressors and Vent support. Later she  developed wound dehiscence and was taken to the OR again for reclosure of her abdominal wound.  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 She was also treated with Ertapenem , linezolid and fluconazole. Patient main complain was back pain which is chronic and now worsen by her Immobility.  Denies Any cough or  fever.  She is eating better denies any nausea or vomiting. Her Colostomy bag has good amount of stool.   Past Medical History:  Diagnosis Date  . Anemia   . Anxiety    pt denies  . Arthritis    KNEES AND BACK  . Bladder incontinence   . Cancer ([facility])    basal cell cancer on nose  . Edema of both feet   . Hypertension   . Pneumonia   . PONV (postoperative nausea and vomiting)    PONV X 1 EPISODE, COMES OUT OF ANESTHESIA FAST, SOME AWARENESS DURING END OF COLONSCOPY  . Scoliosis   . VRE (vancomycin resistant enterococcus) culture positive    Past Surgical History:  Procedure Laterality Date  . ABDOMINAL HYSTERECTOMY     2006 VAG HYST  . ABDOMINAL SURGERY    . APPLICATION OF WOUND VAC  03/10/2018   Procedure: APPLICATION OF WOUND VAC;  Surgeon: [name], MD;  Location: [facility];  Service: General;;  . BACK SURGERY     LOWER, X2  . BIOPSY Right 05/02/2017   Procedure: BIOPSY OF RIGHT UPPER EYELID LESION;  Surgeon: [name], MD;  Location: [facility];  Service: Ophthalmology;  Laterality: Right;  . COLECTOMY WITH COLOSTOMY CREATION/HARTMANN PROCEDURE N/A 02/28/2018   Procedure: PARTIAL COLECTOMY WITH COLOSTOMY;  Surgeon: [name], MD;  Location: [facility];  Service: General;  Laterality: N/A;  . EYE SURGERY Bilateral    cataract surgery with lens implant  . FLEXIBLE SIGMOIDOSCOPY N/A 02/21/2018   Procedure: FLEXIBLE SIGMOIDOSCOPY;  Surgeon: [name], MD;  [facility];  Service: Endoscopy;  Laterality: N/A;  . JOINT REPLACEMENT     2011 LT HIP  . LAPAROTOMY N/A 03/10/2018   Procedure: EXPLORATORY LAPAROTOMY;  Surgeon:  [name], MD;  Location: [facility];  Service: General;  Laterality: N/A;  . POLYPECTOMY  02/21/2018   Procedure: POLYPECTOMY;  Surgeon: [name], MD;  [facility];  Service: Endoscopy;;  rectum  . RECONSTRUCTION OF EYELID Right 05/02/2017   Procedure: TOTAL RECONSTRUCTION OF UPPER EYELID RIGHT EYE WITH FULL THICKNESS SKIN GRAFT FROM RIGHT POSTERIOR EAR;  Surgeon: [name], MD;  Location: [facility];  Service: Ophthalmology;  Laterality: Right;  . SECONDARY CLOSURE OF WOUND  03/10/2018   Procedure: SECONDARY CLOSURE OF WOUND;  Surgeon: [name], MD;  Location: [facility];  Service: General;;  . TONSILLECTOMY    . TOTAL KNEE ARTHROPLASTY Right 04/27/2014   Procedure: [place] TOTAL KNEE ARTHROPLASTY;  Surgeon: Rod Can, MD;  Location: [facility];  Service: Orthopedics;  Laterality: Right;  . TUBAL LIGATION     1974  . VENA CAVA FILTER PLACEMENT N/A 06/04/2017   Procedure: INSERTION VENA-CAVA FILTER;  Surgeon: Angelia Mould, MD;  Location: [facility];  Service: Vascular;  Laterality: N/A;    reports that she has never smoked. She has never used smokeless tobacco. She reports current alcohol use. She reports that she does not use drugs. Social History   Socioeconomic History  . Marital status: Married    Spouse name: Not on file  . Number of children: 3  . Years of education: 42  . Highest education level: Associate degree: occupational, Hotel manager, or vocational program  Occupational History  . Occupation: Retired    Comment: Corporate treasurer  Social Needs  . Financial resource strain: Somewhat hard  . Food insecurity:    Worry: Never true    Inability: Never true  . Transportation needs:    Medical: No    Non-medical: No  Tobacco Use  . Smoking status: Never Smoker  . Smokeless tobacco: Never Used  Substance and Sexual Activity  . Alcohol use: Yes    Comment: OCC WINE  . Drug use: No  . Sexual activity: Not on file  Lifestyle  . Physical activity:     Days per week: 0 days    Minutes per session: 0 min  . Stress: Only a little  Relationships  . Social connections:    Talks on phone: More than three times a week    Gets together: Once a week    Attends religious service: Never    Active member of club or organization: No    Attends meetings of clubs or organizations: Never    Relationship status: Married  . Intimate partner violence:    Fear of current or ex partner: No    Emotionally abused: No    Physically abused: No    Forced sexual activity: No  Other Topics Concern  . Not on file  Social History Narrative  . Not on file    Functional Status Survey:    Family History  Problem Relation Age of Onset  . Congestive Heart Failure Mother   . Colon cancer Father   . Cancer Brother   . Arthritis Sister   . Asthma Sister   . Diabetes Son   . Diabetes Son     Health Maintenance  Topic Date Due  . INFLUENZA VACCINE  12/10/2018 (Originally 08/15/2017)  . TETANUS/TDAP  12/10/2018 (Originally 12/26/1953)  . DEXA SCAN  12/20/2018 (Originally 12/25/2001)  . PNA vac Low Risk Adult  Completed    Allergies  Allergen Reactions  . Penicillins Rash and Other (See Comments)    Has patient had a PCN reaction causing immediate rash, facial/tongue/throat swelling, SOB or lightheadedness with hypotension: ###Yes## Has patient had a PCN reaction causing severe rash involving mucus membranes or skin necrosis: No Has patient had a PCN reaction that required hospitalization No Has patient had a PCN reaction occurring within the last 10 years: No If all of the above answers are "NO", then may proceed with Cephalosporin use.   . Vancomycin     Itching, SOB     Facility-Administered Encounter Medications as of 03/20/2018  Medication  . ondansetron (ZOFRAN) 4 mg in sodium chloride 0.9 % 50 mL IVPB   Outpatient Encounter Medications as of 03/20/2018  Medication Sig  . Amino Acids-Protein Hydrolys (FEEDING SUPPLEMENT, PRO-STAT SUGAR FREE  64,) LIQD Take 30 mLs by mouth 3 (three) times daily.  Marland Kitchen amiodarone (PACERONE) 200 MG tablet Take 1 tablet (200 mg total) by mouth 2 (two) times daily for 30 days.  Marland Kitchen aspirin EC 81 MG tablet Take 81 mg by mouth daily.  . Biotin 1 MG CAPS Take by mouth.  . Cholecalciferol (VITAMIN D3) 5000 units TABS Take 5,000 Units by mouth daily.  . Cranberry 400 MG CAPS Take 1 capsule by mouth daily.   . feeding supplement, ENSURE ENLIVE, (ENSURE ENLIVE) LIQD Take 237 mLs by mouth 2 (two) times daily between meals.  . furosemide (LASIX) 40 MG tablet Take 1 tablet (40 mg total) by mouth daily for 30 days.  . Glucosamine Sulfate 500 MG TABS Take 2 tablets by mouth daily.   . Hypromellose (ARTIFICIAL TEARS OP) Place 2 drops into both eyes daily as needed (for dry eyes).  . montelukast (SINGULAIR) 10 MG tablet Take 10 mg by mouth every morning.  . Multiple Vitamin (MULTIVITAMIN WITH MINERALS) TABS tablet Take 1 tablet by mouth daily for 30 days.  . NON FORMULARY Diet Type:  NAS  . Nutritional Supplements (JUVEN) POWD Juven powder in packet; 7-7-1.5 gram - Give 1 packet mixed in liquid by mouth twice daily between meals  . oxyCODONE-acetaminophen (PERCOCET/ROXICET) 5-325 MG tablet Take 1 tablet by mouth every 6 (six) hours as needed for up to 7 days for moderate pain.  . pantoprazole (PROTONIX) 40 MG tablet Take 1 tablet (40 mg total) by mouth daily at 12 noon for 30 days.  . potassium chloride SA (K-DUR,KLOR-CON) 20 MEQ tablet Take 2 tablets (40 mEq total) by mouth daily for 30 days.  Marland Kitchen Propylene Glycol-Glycerin (SOOTHE) 0.6-0.6 % SOLN Place 2 drops into both eyes daily as needed.  . vitamin B-12 (CYANOCOBALAMIN) 500 MCG tablet Take 500 mcg by mouth daily.  . [DISCONTINUED] Chlorhexidine Gluconate Cloth 2 % PADS Apply 6 each topically daily for 30 days. (Patient not taking: Reported on 03/20/2018)  . [DISCONTINUED] nutrition supplement, JUVEN, (JUVEN) PACK Take 1 packet by mouth 2 (two) times daily between meals for  30 days. (Patient not taking: Reported on 03/20/2018)  Review of Systems  Constitutional: Positive for activity change and appetite change.  HENT: Negative.   Respiratory: Negative.   Cardiovascular: Positive for leg swelling.  Gastrointestinal: Positive for abdominal pain.  Genitourinary: Negative.   Musculoskeletal: Positive for back pain.  Skin: Positive for wound.  Neurological: Positive for weakness.  Psychiatric/Behavioral: Negative.     Vitals:   03/20/18 0939  BP: (!) 136/42  Pulse: 77  Resp: 17  Temp: 98.2 F (36.8 C)  Weight: 219 lb 1.6 oz (99.4 kg)  Height: 5\' 6"  (1.676 m)   Body mass index is 35.36 kg/m. Physical Exam Constitutional:      Appearance: Normal appearance.  HENT:     Head: Normocephalic.     Nose: Nose normal.     Mouth/Throat:     Mouth: Mucous membranes are moist.     Comments: Has thrush Eyes:     Pupils: Pupils are equal, round, and reactive to light.  Neck:     Musculoskeletal: Neck supple.  Cardiovascular:     Rate and Rhythm: Normal rate and regular rhythm.     Pulses: Normal pulses.     Heart sounds: Normal heart sounds.  Pulmonary:     Effort: Pulmonary effort is normal. No respiratory distress.     Breath sounds: Normal breath sounds. No wheezing.  Abdominal:     General: Abdomen is flat. Bowel sounds are normal.  Musculoskeletal:        General: Swelling present.  Skin:    General: Skin is warm and dry.  Neurological:     General: No focal deficit present.     Mental Status: She is alert and oriented to person, place, and time.     Comments: Has arthritis in Left Shoulder and weakness in Both LE  Psychiatric:        Mood and Affect: Mood normal.        Behavior: Behavior normal.     Labs reviewed: Basic Metabolic Panel: Recent Labs    02/28/18 0601 03/01/18 0419  03/07/18 0526 03/08/18 0611 03/09/18 0610  03/12/18 0412 03/13/18 0420 03/17/18 0631  NA 133* 134*   < > 141 140 139   < > 137 136 140  K 4.5  4.3   < > 3.5 3.5 3.5   < > 4.2 4.5 4.4  CL 96* 105   < > 103 99 99   < > 96* 96* 103  CO2 24 21*   < > 31 31 32   < > 33* 32 31  GLUCOSE 100* 162*   < > 120* 131* 120*   < > 141* 124* 115*  BUN 31* 40*   < > 43* 37* 34*   < > 39* 43* 52*  CREATININE 2.23* 1.95*   < > 1.31* 1.21* 1.24*   < > 1.33* 1.24* 1.08*  CALCIUM 8.8* 7.7*   < > 8.2* 8.3* 8.3*   < > 8.2* 8.6* 9.3  MG 2.5* 2.4  --  1.8  --   --   --   --   --   --   PHOS  --  4.0   < > 3.3 3.1 3.2  --   --   --   --    < > = values in this interval not displayed.   Liver Function Tests: Recent Labs    03/03/18 0445 03/04/18 0618  03/08/18 0611 03/09/18 0610 03/10/18 0434  AST 11* 11*  --   --   --  15  ALT 9 8  --   --   --  9  ALKPHOS 33* 33*  --   --   --  41  BILITOT 0.3 0.6  --   --   --  0.7  PROT 4.3* 5.6*  --   --   --  4.3*  ALBUMIN 1.8* 3.3*  3.2*   < > 2.0* 2.0* 2.0*   < > = values in this interval not displayed.   No results for input(s): LIPASE, AMYLASE in the last 8760 hours. No results for input(s): AMMONIA in the last 8760 hours. CBC: Recent Labs    06/04/17 0151  12/09/17 1041 01/28/18 1238  03/10/18 0434 03/12/18 0412 03/13/18 0420  WBC 7.8   < > 6.7 7.1   < > 13.5* 10.4 7.5  NEUTROABS 4.6  --  3.8 4.5  --   --   --   --   HGB 8.6*   < > 13.4 13.8   < > 8.7* 7.5* 7.1*  HCT 26.6*   < > 39.5 40.8   < > 27.9* 24.7* 23.2*  MCV 94.0   < > 91 91   < > 96.5 96.5 97.9  PLT 137*   < > 214 173   < > 263 174 148*   < > = values in this interval not displayed.   Cardiac Enzymes: No results for input(s): CKTOTAL, CKMB, CKMBINDEX, TROPONINI in the last 8760 hours. BNP: Invalid input(s): POCBNP No results found for: HGBA1C No results found for: TSH No results found for: VITAMINB12 No results found for: FOLATE Lab Results  Component Value Date   FERRITIN 81 12/09/2017    Imaging and Procedures obtained prior to SNF admission: No results found.  Assessment/Plan S/P hartmann procedure with Colostomy  bag and S/P Peritoneal Was and wound vac. 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 Eating well. No Fever. Pain seems to be controlled. D/w the nurse and wound looks good. Getting changed every 2 days. Finished Broad spectrum Antibiotics in the hospital New Onset Atrial fibrillation On Amiodarone In Sinus rhythm. EF 60% with LAE Per Cardiology not on any Anticoagulation Follow up with them CKD Creat came down and is now near her baseline BUN slightly high right now Will continue Encouraging Fluid Repeat BMP Bilateral Edema On low dose of lasix also had Pulmonary Edema in the hospital due to fluid resuscitation Anemia Due to Blood loss and Chronic Disease Will start on Iron. And Repeat CBC Mouth Thrush Nystatin Generalized weakness Will work with therapy. Lives with her husband was not very active before either due to chronic Back pain  Family/ staff Communication:   Labs/tests ordered: Total time spent in this patient care encounter was 45_ minutes; greater than 50% of the visit spent counseling patient, reviewing records , Labs and coordinating care for problems addressed at this encounter.

## 2018-03-21 ENCOUNTER — Encounter: Payer: Self-pay | Admitting: Adult Health

## 2018-03-21 ENCOUNTER — Non-Acute Institutional Stay (SKILLED_NURSING_FACILITY): Payer: Medicare Other | Admitting: Adult Health

## 2018-03-21 DIAGNOSIS — D62 Acute posthemorrhagic anemia: Secondary | ICD-10-CM

## 2018-03-21 DIAGNOSIS — I4891 Unspecified atrial fibrillation: Secondary | ICD-10-CM | POA: Diagnosis not present

## 2018-03-21 DIAGNOSIS — J3089 Other allergic rhinitis: Secondary | ICD-10-CM

## 2018-03-21 NOTE — Progress Notes (Signed)
Location:   [facility] Room Number: 129 P Place of Service:  SNF (31)   CODE STATUS: Full Code  Allergies  Allergen Reactions  . Penicillins Rash and Other (See Comments)    Has patient had a PCN reaction causing immediate rash, facial/tongue/throat swelling, SOB or lightheadedness with hypotension: ###Yes## Has patient had a PCN reaction causing severe rash involving mucus membranes or skin necrosis: No Has patient had a PCN reaction that required hospitalization No Has patient had a PCN reaction occurring within the last 10 years: No If all of the above answers are "NO", then may proceed with Cephalosporin use.   . Vancomycin     Itching, SOB     Chief Complaint  Patient presents with  . Medical Management of Chronic Issues    New onset atrial fibrillation; chronic non-seasonal allergic rhinitis; acute blood loss anemia. Weekly follow up for the first 30 days post hospitalization.     HPI:  She is a 84 year old short term rehab patient being seen for the management of her chronic illnesses; afib; allergic rhinitis; anemia. She denies any allergic symptoms; no congestion; no uncontrolled pain;no changes in appetite; no fevers.   Past Medical History:  Diagnosis Date  . Anemia   . Anxiety    pt denies  . Arthritis    KNEES AND BACK  . Bladder incontinence   . Cancer ([place])    basal cell cancer on nose  . Edema of both feet   . Hypertension   . Pneumonia   . PONV (postoperative nausea and vomiting)    PONV X 1 EPISODE, COMES OUT OF ANESTHESIA FAST, SOME AWARENESS DURING END OF COLONSCOPY  . Scoliosis   . VRE (vancomycin resistant enterococcus) culture positive     Past Surgical History:  Procedure Laterality Date  . ABDOMINAL HYSTERECTOMY     2006 VAG HYST  . ABDOMINAL SURGERY    . APPLICATION OF WOUND VAC  03/10/2018   Procedure: APPLICATION OF WOUND VAC;  Surgeon: [name], MD;  Location: [facility];  Service: General;;  . BACK  SURGERY     LOWER, X2  . BIOPSY Right 05/02/2017   Procedure: BIOPSY OF RIGHT UPPER EYELID LESION;  Surgeon: [name], MD;  Location: [place];  Service: Ophthalmology;  Laterality: Right;  . COLECTOMY WITH COLOSTOMY CREATION/HARTMANN PROCEDURE N/A 02/28/2018   Procedure: PARTIAL COLECTOMY WITH COLOSTOMY;  Surgeon: [name], MD;  Location: [facility];  Service: General;  Laterality: N/A;  . EYE SURGERY Bilateral    cataract surgery with lens implant  . FLEXIBLE SIGMOIDOSCOPY N/A 02/21/2018   Procedure: FLEXIBLE SIGMOIDOSCOPY;  Surgeon: [name], MD;  [facility];  Service: Endoscopy;  Laterality: N/A;  . JOINT REPLACEMENT     2011 LT HIP  . LAPAROTOMY N/A 03/10/2018   Procedure: EXPLORATORY LAPAROTOMY;  Surgeon: [name], MD;  Location: [facility];  Service: General;  Laterality: N/A;  . POLYPECTOMY  02/21/2018   Procedure: POLYPECTOMY;  Surgeon: [name], MD;  [facility];  Service: Endoscopy;;  rectum  . RECONSTRUCTION OF EYELID Right 05/02/2017   Procedure: TOTAL RECONSTRUCTION OF UPPER EYELID RIGHT EYE WITH FULL THICKNESS SKIN GRAFT FROM RIGHT POSTERIOR EAR;  Surgeon: [name], MD;  Location: [place];  Service: Ophthalmology;  Laterality: Right;  . SECONDARY CLOSURE OF WOUND  03/10/2018   Procedure: SECONDARY CLOSURE OF WOUND;  Surgeon: [name], MD;  Location: [facility];  Service: General;;  . TONSILLECTOMY    . TOTAL KNEE ARTHROPLASTY Right 04/27/2014   Procedure: [place] TOTAL KNEE ARTHROPLASTY;  Surgeon: Rod Can, MD;  Location: [facility];  Service: Orthopedics;  Laterality: Right;  . TUBAL LIGATION     1974  . VENA CAVA FILTER PLACEMENT N/A 06/04/2017   Procedure: INSERTION VENA-CAVA FILTER;  Surgeon: Angelia Mould, MD;  Location: [facility] OR;  Service: Vascular;  Laterality: N/A;    Social History   Socioeconomic History  . Marital status: Married    Spouse name: Not on file  . Number of children: 3  . Years of education:  42  . Highest education level: Associate degree: occupational, Hotel manager, or vocational program  Occupational History  . Occupation: Retired    Comment: Corporate treasurer  Social Needs  . Financial resource strain: Somewhat hard  . Food insecurity:    Worry: Never true    Inability: Never true  . Transportation needs:    Medical: No    Non-medical: No  Tobacco Use  . Smoking status: Never Smoker  . Smokeless tobacco: Never Used  Substance and Sexual Activity  . Alcohol use: Yes    Comment: OCC WINE  . Drug use: No  . Sexual activity: Not on file  Lifestyle  . Physical activity:    Days per week: 0 days    Minutes per session: 0 min  . Stress: Only a little  Relationships  . Social connections:    Talks on phone: More than three times a week    Gets together: Once a week    Attends religious service: Never    Active member of club or organization: No    Attends meetings of clubs or organizations: Never    Relationship status: Married  . Intimate partner violence:    Fear of current or ex partner: No    Emotionally abused: No    Physically abused: No    Forced sexual activity: No  Other Topics Concern  . Not on file  Social History Narrative  . Not on file   Family History  Problem Relation Age of Onset  . Congestive Heart Failure Mother   . Colon cancer Father   . Cancer Brother   . Arthritis Sister   . Asthma Sister   . Diabetes Son   . Diabetes Son       VITAL SIGNS BP 122/70   Pulse 74   Temp 98.2 F (36.8 C)   Resp 20   Ht 5\' 6"  (1.676 m)   Wt 219 lb 1.6 oz (99.4 kg)   BMI 35.36 kg/m   Facility-Administered Encounter Medications as of 03/21/2018  Medication  . ondansetron (ZOFRAN) 4 mg in sodium chloride 0.9 % 50 mL IVPB   Outpatient Encounter Medications as of 03/21/2018  Medication Sig  . Amino Acids-Protein Hydrolys (FEEDING SUPPLEMENT, PRO-STAT SUGAR FREE 64,) LIQD Take 30 mLs by mouth 3 (three) times daily.  Marland Kitchen amiodarone (PACERONE) 200 MG  tablet Take 1 tablet (200 mg total) by mouth 2 (two) times daily for 30 days.  Marland Kitchen aspirin EC 81 MG tablet Take 81 mg by mouth daily.  . Biotin 1 MG CAPS Take by mouth.  . Cholecalciferol (VITAMIN D3) 5000 units TABS Take 5,000 Units by mouth daily.  . Cranberry 450 MG CAPS Take 1 capsule by mouth daily.   . feeding supplement, ENSURE ENLIVE, (ENSURE ENLIVE) LIQD Take 237 mLs by mouth 2 (two) times daily between meals.  Marland Kitchen  ferrous sulfate 325 (65 FE) MG tablet Take 325 mg by mouth daily with breakfast.  . furosemide (LASIX) 20 MG tablet Take 20 mg by mouth daily.  . Glucosamine Sulfate 500 MG TABS Take 2 tablets by mouth daily.   . montelukast (SINGULAIR) 10 MG tablet Take 10 mg by mouth every morning.  . Multiple Vitamin (MULTIVITAMIN WITH MINERALS) TABS tablet Take 1 tablet by mouth daily for 30 days.  . NON FORMULARY Diet Type:  NAS  . Nutritional Supplements (JUVEN) POWD Juven powder in packet; 7-7-1.5 gram - Give 1 packet mixed in liquid by mouth twice daily between meals  . oxyCODONE-acetaminophen (PERCOCET/ROXICET) 5-325 MG tablet Take 1 tablet by mouth every 6 (six) hours as needed for up to 7 days for moderate pain.  . pantoprazole (PROTONIX) 40 MG tablet Take 1 tablet (40 mg total) by mouth daily at 12 noon for 30 days.  . potassium chloride SA (K-DUR,KLOR-CON) 20 MEQ tablet Take 2 tablets (40 mEq total) by mouth daily for 30 days.  Marland Kitchen Propylene Glycol-Glycerin (SOOTHE) 0.6-0.6 % SOLN Place 2 drops into both eyes daily as needed.  . vitamin B-12 (CYANOCOBALAMIN) 500 MCG tablet Take 500 mcg by mouth daily.  . [DISCONTINUED] furosemide (LASIX) 40 MG tablet Take 1 tablet (40 mg total) by mouth daily for 30 days. (Patient not taking: Reported on 03/21/2018)  . [DISCONTINUED] Hypromellose (ARTIFICIAL TEARS OP) Place 2 drops into both eyes daily as needed (for dry eyes).     SIGNIFICANT DIAGNOSTIC EXAMS  PREVIOUS;   02-20-18: ct of abdomen and pelvis:  1. Large amount of stool throughout the  colon to the level of the distal sigmoid colon where there is a short segment of concentric, medium density wall thickening. This is concerning for an obstructing short segment mass. It would be unusual for edema due to diverticulitis to involve that short of a segment of bowel. Correlation with sigmoidoscopy or colonoscopy is recommended. 2. Mild pericolonic soft tissue stranding involving the distal sigmoid colon, most pronounced at the level of maximal distension of the colon by the stool. This is suspicious for mild diverticulitis without abscess. There is minimal diverticulosis in that region of the colon. 3. Cholelithiasis. 4. 5 mm probable hemangioma in the spleen.  02-27-18: ct of abdomen and pelvis:  1. New small amount of free intraperitoneal air, consistent with bowel perforation. 2. Large colonic stool burden with transition point near the rectosigmoid junction, consistent with stricture. No definite soft tissue mass appreciated by CT. This is unchanged in appearance compared to previous study. Recommend correlation with colonoscopy to exclude obstructing colon carcinoma. 3. Mild diverticulitis of proximal sigmoid colon, without significant change. 4. New mild ascites and diffuse body wall edema. New small bilateral pleural effusions and bibasilar atelectasis. 5. Cholelithiasis. No radiographic evidence of cholecystitis.  03-03-18: 2-d echo:   Left Ventricle: The left ventricle has normal systolic function, with an ejection fraction of 60-65%. The cavity size was normal. There is mildly increased left ventricular wall thickness. Left ventricular diastolic parameters were normal Right Ventricle: The right ventricle has normal systolic function. The cavity was normal. There is no increase in right ventricular wall thickness.  03-04-18; chest x-ray: Low lung volumes with cardiomegaly, vascular congestion and tiny Effusions.  NO NEW EXAMS   LABS REVIEWED PREVIOUS;   02-20-18: wbc 11.8; hgb  12.6; hct 40.4 mcv 95.7 plt 187; glucose 145; bun 23; creat 1.06 ;k+ 3.6; na++ 138; ca 9.1 liver normal albumin  02-26-18; wbc 16.1; hgb  11.2; hct 34.3; mcv 94.2; plt 221; glucose 118;bun 14; creat 0.90; k+ 4.0; na++ 134; ca 8.3  blood culture/urine culture: no growth flu Neg 02-27-18: BNP 325.0  03-01-18: wbc 25.0; hgb 10.4; hct 31.7; mcv 94.;3 plt 367; glucose 162; bun 40; creat 1.95; k+ 4.3; na++ 134; ca 7.7 mag  2.4; phos 4.0 03-03-18: wbc 17.0; hgb 8.4; hct 26.3; mcv 92.6 plt 222; glucose 148; bun 53; creat 1.53; k+ 4.0; na++ 139; ca  8.3; protein 4.3; albumin 1.8; liver normal  03-06-18: wbc 17.0; hgb 8.5; hct 26.7; mcv 93.0; plt 278; glucose 184; bun 48; creat 1.38; k+ 3.2; an++ 139; ca 8.0 phos 3.1; albumin 2.0  03-10-18: wbc 13.5; hgb 8.7; hct 27.9; mcv 96.5 plt 263; glucose 115; bun 32; creat 1.25; k+ 3.8; na++ 138; ca 8.4 total protein 4.3; albumin 2.0 liver normal  03-13-18: wbc 7.5; hgb 7.1; hct 23.2; mcv 97.9; plt 148 glucose 124; bun 43; creat 1.24; k+ 4.5; na++ 136; ca 8.6   TODAY:   03-17-18: glucose 115; bun 52; creat 1.08  k+ 44; na++140 ca 9.3   Review of Systems  Constitutional: Negative for malaise/fatigue.  Respiratory: Negative for cough and shortness of breath.   Cardiovascular: Negative for chest pain, palpitations and leg swelling.  Gastrointestinal: Negative for abdominal pain, constipation and heartburn.  Musculoskeletal: Negative for back pain, joint pain and myalgias.  Skin: Negative.   Neurological: Negative for dizziness.  Psychiatric/Behavioral: The patient is not nervous/anxious.      Physical Exam Constitutional:      General: She is not in acute distress.    Appearance: She is well-developed. She is obese. She is not diaphoretic.  Neck:     Musculoskeletal: Neck supple.     Thyroid: No thyromegaly.  Cardiovascular:     Rate and Rhythm: Normal rate and regular rhythm.     Pulses: Normal pulses.     Heart sounds: Normal heart sounds.     Comments: History  of IVC filter placement  Pulmonary:     Effort: Pulmonary effort is normal. No respiratory distress.     Breath sounds: Normal breath sounds.  Abdominal:     General: Bowel sounds are normal. There is no distension.     Palpations: Abdomen is soft.     Tenderness: There is no abdominal tenderness.     Comments: Colostomy present   Musculoskeletal:     Right lower leg: Edema present.     Left lower leg: Edema present.     Comments: 2+ bilateral lower extremity edema Is able to move upper extremities-weakness present Did not move lower extremities  History of right knee replacement 2016  Lymphadenopathy:     Cervical: No cervical adenopathy.  Skin:    General: Skin is warm and dry.     Comments: Abdominal wound with vac in place   Neurological:     Mental Status: She is alert and oriented to person, place, and time.  Psychiatric:        Mood and Affect: Mood normal.       ASSESSMENT/ PLAN:  TODAY:   1.  New onset atrial fibrillation: heart rate is stable will continue amiodarone 200 mg twice daily for rate control and asa 81 mg daily is status post IVC filter placement   2. Acute blood loss anemia: is stable hgb 7.1 Will continue to monitor   3. Chronic non-seasonal allergic rhinitis: is stable will continue singulair 10 mg daily   PREVIOUS  4. GERD without esophagitis: is stable will continue protonix 40 mg daily   5. Severe protein calorie malnutrition: is without change: total protein 4.3; albumin 2.0; will continue prostat 30 cc three times daily; ensure twice daily and juven twice daily   6. Venous stasis of both lower extremities: is without change will continue lasix 40 mg daily with k+ 40 meq daily   7. Large bowel obstruction/diverticulitis of large intestine with perforation. Is status post exploratory lap with colostomy: is stable will continue wound care as indicated and will continue therapy as directed to improve upon independence with her adls.   8.  Essential hypertension: is stable b/p 122/70: will continue to monitor   9. Chronic deep vein thrombosis (DVT) of right lower extremity unspecified vein: is stable is status post IVC filter placement; will continue asa 81 mg daily   10. Chronic kidney disease stage 3 (moderate): is stable bun 52; creat 1.08 will monitor   11. Wound dehiscence surgical; sequela: is stable has wound vac in place will monitor  12. Chronic back pain: her pain is managed with percocet 5/325 mg every 6 hours as needed   MD is aware of resident's narcotic use and is in agreement with current plan of care. We will attempt to wean resident as apropriate   Ok Edwards NP [facility]  Contact [phone] Monday through Friday 8am- 5pm  After hours call [phone]

## 2018-03-24 ENCOUNTER — Encounter (HOSPITAL_COMMUNITY)
Admission: RE | Admit: 2018-03-24 | Discharge: 2018-03-24 | Disposition: A | Discharge status: Home / Self Care | Payer: Medicare Other | Source: Skilled Nursing Facility | Attending: Internal Medicine | Admitting: Internal Medicine

## 2018-03-24 LAB — COMPREHENSIVE METABOLIC PANEL
ALT: 11 U/L (ref 0–44)
AST: 13 U/L — ABNORMAL LOW (ref 15–41)
Albumin: 2.1 g/dL — ABNORMAL LOW (ref 3.5–5.0)
Alkaline Phosphatase: 66 U/L (ref 38–126)
Anion gap: 7 (ref 5–15)
BUN: 30 mg/dL — ABNORMAL HIGH (ref 8–23)
CO2: 26 mmol/L (ref 22–32)
Calcium: 9 mg/dL (ref 8.9–10.3)
Chloride: 106 mmol/L (ref 98–111)
Creatinine, Ser: 1.02 mg/dL — ABNORMAL HIGH (ref 0.44–1.00)
GFR calc Af Amer: 60 mL/min — ABNORMAL LOW (ref >60)
GFR, EST NON AFRICAN AMERICAN: 52 mL/min — AB (ref >60)
Glucose, Bld: 128 mg/dL — ABNORMAL HIGH (ref 70–99)
Potassium: 4.1 mmol/L (ref 3.5–5.1)
Sodium: 139 mmol/L (ref 135–145)
Total Bilirubin: 0.3 mg/dL (ref 0.3–1.2)
Total Protein: 4.9 g/dL — ABNORMAL LOW (ref 6.5–8.1)

## 2018-03-24 LAB — CBC
HCT: 24 % — ABNORMAL LOW (ref 36.0–46.0)
HEMOGLOBIN: 7.2 g/dL — AB (ref 12.0–15.0)
MCH: 29 pg (ref 26.0–34.0)
MCHC: 30 g/dL (ref 30.0–36.0)
MCV: 96.8 fL (ref 80.0–100.0)
Platelets: 374 10*3/uL (ref 150–400)
RBC: 2.48 MIL/uL — ABNORMAL LOW (ref 3.87–5.11)
RDW: 14.6 % (ref 11.5–15.5)
WBC: 6 10*3/uL (ref 4.0–10.5)
nRBC: 0 % (ref 0.0–0.2)

## 2018-03-25 ENCOUNTER — Non-Acute Institutional Stay (SKILLED_NURSING_FACILITY): Payer: Medicare Other | Admitting: Adult Health

## 2018-03-25 ENCOUNTER — Encounter: Payer: Self-pay | Admitting: Adult Health

## 2018-03-25 DIAGNOSIS — D62 Acute posthemorrhagic anemia: Secondary | ICD-10-CM | POA: Diagnosis not present

## 2018-03-25 NOTE — Progress Notes (Signed)
Location:   [facility] Room Number: 129 P Place of Service:  SNF (31)   CODE STATUS: Full Code  Allergies  Allergen Reactions  . Penicillins Rash and Other (See Comments)    Has patient had a PCN reaction causing immediate rash, facial/tongue/throat swelling, SOB or lightheadedness with hypotension: ###Yes## Has patient had a PCN reaction causing severe rash involving mucus membranes or skin necrosis: No Has patient had a PCN reaction that required hospitalization No Has patient had a PCN reaction occurring within the last 10 years: No If all of the above answers are "NO", then may proceed with Cephalosporin use.   . Vancomycin     Itching, SOB     Chief Complaint  Patient presents with  . Acute Visit    Lab follow up    HPI:  Her hgb is 7.2; the previous 7.1. her range is from the 7's to the 9's. There are no reports of changes in stool; no reports of active bleeding present. She denies any chest pain present,   Past Medical History:  Diagnosis Date  . Anemia   . Anxiety    pt denies  . Arthritis    KNEES AND BACK  . Bladder incontinence   . Cancer ([place])    basal cell cancer on nose  . Edema of both feet   . Hypertension   . Pneumonia   . PONV (postoperative nausea and vomiting)    PONV X 1 EPISODE, COMES OUT OF ANESTHESIA FAST, SOME AWARENESS DURING END OF COLONSCOPY  . Scoliosis   . VRE (vancomycin resistant enterococcus) culture positive     Past Surgical History:  Procedure Laterality Date  . ABDOMINAL HYSTERECTOMY     2006 VAG HYST  . ABDOMINAL SURGERY    . APPLICATION OF WOUND VAC  03/10/2018   Procedure: APPLICATION OF WOUND VAC;  Surgeon: [name], MD;  Location: [facility];  Service: General;;  . BACK SURGERY     LOWER, X2  . BIOPSY Right 05/02/2017   Procedure: BIOPSY OF RIGHT UPPER EYELID LESION;  Surgeon: [name], MD;  Location: [facility];  Service: Ophthalmology;  Laterality: Right;  . COLECTOMY WITH  COLOSTOMY CREATION/HARTMANN PROCEDURE N/A 02/28/2018   Procedure: PARTIAL COLECTOMY WITH COLOSTOMY;  Surgeon: [name], MD;  Location: [facility];  Service: General;  Laterality: N/A;  . EYE SURGERY Bilateral    cataract surgery with lens implant  . FLEXIBLE SIGMOIDOSCOPY N/A 02/21/2018   Procedure: FLEXIBLE SIGMOIDOSCOPY;  Surgeon: [name], MD;  [facility];  Service: Endoscopy;  Laterality: N/A;  . JOINT REPLACEMENT     2011 LT HIP  . LAPAROTOMY N/A 03/10/2018   Procedure: EXPLORATORY LAPAROTOMY;  Surgeon: [name], MD;  Location: [facility];  Service: General;  Laterality: N/A;  . POLYPECTOMY  02/21/2018   Procedure: POLYPECTOMY;  Surgeon: [name], MD;  [facility];  Service: Endoscopy;;  rectum  . RECONSTRUCTION OF EYELID Right 05/02/2017   Procedure: TOTAL RECONSTRUCTION OF UPPER EYELID RIGHT EYE WITH FULL THICKNESS SKIN GRAFT FROM RIGHT POSTERIOR EAR;  Surgeon: [name], MD;  Location: [facility];  Service: Ophthalmology;  Laterality: Right;  . SECONDARY CLOSURE OF WOUND  03/10/2018   Procedure: SECONDARY CLOSURE OF WOUND;  Surgeon: [name], MD;  Location: [facility];  Service: General;;  . TONSILLECTOMY    . TOTAL KNEE ARTHROPLASTY Right 04/27/2014   Procedure: [place] TOTAL KNEE ARTHROPLASTY;  Surgeon: Aaron Edelman  Swinteck, MD;  Location: [facility];  Service: Orthopedics;  Laterality: Right;  . TUBAL LIGATION     1974  . VENA CAVA FILTER PLACEMENT N/A 06/04/2017   Procedure: INSERTION VENA-CAVA FILTER;  Surgeon: Angelia Mould, MD;  Location: [facility] OR;  Service: Vascular;  Laterality: N/A;    Social History   Socioeconomic History  . Marital status: Married    Spouse name: Not on file  . Number of children: 3  . Years of education: 49  . Highest education level: Associate degree: occupational, Hotel manager, or vocational program  Occupational History  . Occupation: Retired    Comment: Corporate treasurer  Social Needs  . Financial resource  strain: Somewhat hard  . Food insecurity:    Worry: Never true    Inability: Never true  . Transportation needs:    Medical: No    Non-medical: No  Tobacco Use  . Smoking status: Never Smoker  . Smokeless tobacco: Never Used  Substance and Sexual Activity  . Alcohol use: Yes    Comment: OCC WINE  . Drug use: No  . Sexual activity: Not on file  Lifestyle  . Physical activity:    Days per week: 0 days    Minutes per session: 0 min  . Stress: Only a little  Relationships  . Social connections:    Talks on phone: More than three times a week    Gets together: Once a week    Attends religious service: Never    Active member of club or organization: No    Attends meetings of clubs or organizations: Never    Relationship status: Married  . Intimate partner violence:    Fear of current or ex partner: No    Emotionally abused: No    Physically abused: No    Forced sexual activity: No  Other Topics Concern  . Not on file  Social History Narrative  . Not on file   Family History  Problem Relation Age of Onset  . Congestive Heart Failure Mother   . Colon cancer Father   . Cancer Brother   . Arthritis Sister   . Asthma Sister   . Diabetes Son   . Diabetes Son       VITAL SIGNS BP 133/66   Pulse 79   Temp (!) 97.3 F (36.3 C)   Resp 20   Ht 5\' 6"  (1.676 m)   Wt 209 lb 8 oz (95 kg)   BMI 33.81 kg/m   Facility-Administered Encounter Medications as of 03/25/2018  Medication  . ondansetron (ZOFRAN) 4 mg in sodium chloride 0.9 % 50 mL IVPB   Outpatient Encounter Medications as of 03/25/2018  Medication Sig  . Amino Acids-Protein Hydrolys (FEEDING SUPPLEMENT, PRO-STAT SUGAR FREE 64,) LIQD Take 30 mLs by mouth 3 (three) times daily.  Marland Kitchen amiodarone (PACERONE) 200 MG tablet Take 200 mg by mouth daily.  Marland Kitchen aspirin EC 81 MG tablet Take 81 mg by mouth daily.  . Biotin 1 MG CAPS Take by mouth.  . Cholecalciferol (VITAMIN D3) 5000 units TABS Take 5,000 Units by mouth daily.    . Cranberry 450 MG CAPS Take 1 capsule by mouth daily.   . feeding supplement, ENSURE ENLIVE, (ENSURE ENLIVE) LIQD Take 237 mLs by mouth 2 (two) times daily between meals.  . ferrous sulfate 325 (65 FE) MG tablet Take 325 mg by mouth daily with breakfast.  . furosemide (LASIX) 20 MG tablet Take 20 mg by mouth daily.  . Glucosamine  Sulfate 500 MG TABS Take 2 tablets by mouth daily.   . montelukast (SINGULAIR) 10 MG tablet Take 10 mg by mouth every morning.  . Multiple Vitamin (MULTIVITAMIN) tablet Take 1 tablet by mouth daily.  . NON FORMULARY Diet Type:  NAS  . Nutritional Supplements (JUVEN) POWD Juven powder in packet; 7-7-1.5 gram - Give 1 packet mixed in liquid by mouth twice daily between meals  . ondansetron (ZOFRAN) 4 MG tablet Take 4 mg by mouth every 8 (eight) hours as needed for nausea or vomiting.  . pantoprazole (PROTONIX) 40 MG tablet Take 1 tablet (40 mg total) by mouth daily at 12 noon for 30 days.  . potassium chloride SA (K-DUR,KLOR-CON) 20 MEQ tablet Take 40 mEq by mouth daily.  Marland Kitchen Propylene Glycol-Glycerin (SOOTHE) 0.6-0.6 % SOLN Place 2 drops into both eyes daily as needed.  . vitamin B-12 (CYANOCOBALAMIN) 500 MCG tablet Take 500 mcg by mouth daily.  . [DISCONTINUED] amiodarone (PACERONE) 200 MG tablet Take 1 tablet (200 mg total) by mouth 2 (two) times daily for 30 days. (Patient not taking: Reported on 03/25/2018)  . [DISCONTINUED] Multiple Vitamin (MULTIVITAMIN WITH MINERALS) TABS tablet Take 1 tablet by mouth daily for 30 days. (Patient not taking: Reported on 03/25/2018)  . [DISCONTINUED] potassium chloride SA (K-DUR,KLOR-CON) 20 MEQ tablet Take 2 tablets (40 mEq total) by mouth daily for 30 days. (Patient not taking: Reported on 03/25/2018)     SIGNIFICANT DIAGNOSTIC EXAMS   PREVIOUS;   02-20-18: ct of abdomen and pelvis:  1. Large amount of stool throughout the colon to the level of the distal sigmoid colon where there is a short segment of concentric, medium density  wall thickening. This is concerning for an obstructing short segment mass. It would be unusual for edema due to diverticulitis to involve that short of a segment of bowel. Correlation with sigmoidoscopy or colonoscopy is recommended. 2. Mild pericolonic soft tissue stranding involving the distal sigmoid colon, most pronounced at the level of maximal distension of the colon by the stool. This is suspicious for mild diverticulitis without abscess. There is minimal diverticulosis in that region of the colon. 3. Cholelithiasis. 4. 5 mm probable hemangioma in the spleen.  02-27-18: ct of abdomen and pelvis:  1. New small amount of free intraperitoneal air, consistent with bowel perforation. 2. Large colonic stool burden with transition point near the rectosigmoid junction, consistent with stricture. No definite soft tissue mass appreciated by CT. This is unchanged in appearance compared to previous study. Recommend correlation with colonoscopy to exclude obstructing colon carcinoma. 3. Mild diverticulitis of proximal sigmoid colon, without significant change. 4. New mild ascites and diffuse body wall edema. New small bilateral pleural effusions and bibasilar atelectasis. 5. Cholelithiasis. No radiographic evidence of cholecystitis.  03-03-18: 2-d echo:   Left Ventricle: The left ventricle has normal systolic function, with an ejection fraction of 60-65%. The cavity size was normal. There is mildly increased left ventricular wall thickness. Left ventricular diastolic parameters were normal Right Ventricle: The right ventricle has normal systolic function. The cavity was normal. There is no increase in right ventricular wall thickness.  03-04-18; chest x-ray: Low lung volumes with cardiomegaly, vascular congestion and tiny Effusions.  NO NEW EXAMS   LABS REVIEWED PREVIOUS;   02-20-18: wbc 11.8; hgb 12.6; hct 40.4 mcv 95.7 plt 187; glucose 145; bun 23; creat 1.06 ;k+ 3.6; na++ 138; ca 9.1 liver normal  albumin  02-26-18; wbc 16.1; hgb 11.2; hct 34.3; mcv 94.2; plt 221; glucose 118;bun  14; creat 0.90; k+ 4.0; na++ 134; ca 8.3  blood culture/urine culture: no growth flu Neg 02-27-18: BNP 325.0  03-01-18: wbc 25.0; hgb 10.4; hct 31.7; mcv 94.;3 plt 367; glucose 162; bun 40; creat 1.95; k+ 4.3; na++ 134; ca 7.7 mag  2.4; phos 4.0 03-03-18: wbc 17.0; hgb 8.4; hct 26.3; mcv 92.6 plt 222; glucose 148; bun 53; creat 1.53; k+ 4.0; na++ 139; ca  8.3; protein 4.3; albumin 1.8; liver normal  03-06-18: wbc 17.0; hgb 8.5; hct 26.7; mcv 93.0; plt 278; glucose 184; bun 48; creat 1.38; k+ 3.2; an++ 139; ca 8.0 phos 3.1; albumin 2.0  03-10-18: wbc 13.5; hgb 8.7; hct 27.9; mcv 96.5 plt 263; glucose 115; bun 32; creat 1.25; k+ 3.8; na++ 138; ca 8.4 total protein 4.3; albumin 2.0 liver normal  03-13-18: wbc 7.5; hgb 7.1; hct 23.2; mcv 97.9; plt 148 glucose 124; bun 43; creat 1.24; k+ 4.5; na++ 136; ca 8.6  03-17-18: glucose 115; bun 52; creat 1.08  k+ 44; na++140 ca 9.3   LABS REVIEWED:   03-24-18: wbc 6.0; hgb 7.2; hct 24.0; mcv 96.8; plt 374; glucose 128; bun 30; creat 1.02; k+ 4.1; na++ 139; ca 9.0; liver normal albumin 2.1    Review of Systems  Constitutional: Negative for malaise/fatigue.  Respiratory: Negative for cough and shortness of breath.   Cardiovascular: Negative for chest pain, palpitations and leg swelling.  Gastrointestinal: Negative for abdominal pain, constipation and heartburn.  Musculoskeletal: Negative for back pain, joint pain and myalgias.  Skin: Negative.   Neurological: Negative for dizziness.  Psychiatric/Behavioral: The patient is not nervous/anxious.      Physical Exam Constitutional:      General: She is not in acute distress.    Appearance: She is well-developed. She is not diaphoretic.  Neck:     Musculoskeletal: Neck supple.     Thyroid: No thyromegaly.  Cardiovascular:     Rate and Rhythm: Normal rate and regular rhythm.     Pulses: Normal pulses.     Heart sounds: Normal  heart sounds.     Comments: History of IVC filter placement  Pulmonary:     Effort: Pulmonary effort is normal. No respiratory distress.     Breath sounds: Normal breath sounds.  Abdominal:     General: Bowel sounds are normal. There is no distension.     Palpations: Abdomen is soft.     Tenderness: There is no abdominal tenderness.     Comments: Colostomy present    Musculoskeletal:     Right lower leg: Edema present.     Left lower leg: Edema present.     Comments: 2+ bilateral lower extremity edema Is able to move all extremities  History of right knee replacement 2016    Lymphadenopathy:     Cervical: No cervical adenopathy.  Skin:    General: Skin is warm and dry.     Comments: Wound vac in place in abdominal wound   Neurological:     Mental Status: She is alert and oriented to person, place, and time.  Psychiatric:        Mood and Affect: Mood normal.      ASSESSMENT/ PLAN:  TODAY;   1. Acute blood loss anemia: hgb 7.2; is stable at this time; will not make changes will monitor her status.   MD is aware of resident's narcotic use and is in agreement with current plan of care. We will attempt to wean resident as apropriate   Ok Edwards  NP [facility]  Contact [phone] Monday through Friday 8am- 5pm  After hours call [phone]

## 2018-03-26 ENCOUNTER — Ambulatory Visit: Payer: Medicare Other | Admitting: Gastroenterology

## 2018-03-28 ENCOUNTER — Encounter: Payer: Self-pay | Admitting: Adult Health

## 2018-03-28 ENCOUNTER — Non-Acute Institutional Stay (SKILLED_NURSING_FACILITY): Payer: Medicare Other | Admitting: Adult Health

## 2018-03-28 DIAGNOSIS — N183 Chronic kidney disease, stage 3 unspecified: Secondary | ICD-10-CM

## 2018-03-28 DIAGNOSIS — I10 Essential (primary) hypertension: Secondary | ICD-10-CM

## 2018-03-28 DIAGNOSIS — I82501 Chronic embolism and thrombosis of unspecified deep veins of right lower extremity: Secondary | ICD-10-CM

## 2018-03-28 NOTE — Progress Notes (Signed)
Location:    [facility] Room Number: 129/P Place of Service:  SNF (31)   CODE STATUS: Full Code  Allergies  Allergen Reactions  . Penicillins Rash and Other (See Comments)    Has patient had a PCN reaction causing immediate rash, facial/tongue/throat swelling, SOB or lightheadedness with hypotension: ###Yes## Has patient had a PCN reaction causing severe rash involving mucus membranes or skin necrosis: No Has patient had a PCN reaction that required hospitalization No Has patient had a PCN reaction occurring within the last 10 years: No If all of the above answers are "NO", then may proceed with Cephalosporin use.   . Vancomycin     Itching, SOB     Chief Complaint  Patient presents with  . Medical Management of Chronic Issues    Essential hypertension; deep vein thrombosis (dvt) of right lower extremity unspecified vein chronic kidney disease stage 3 (moderate). Weekly follow up for the first 30 days post hospitalization     HPI:  She is a 84 year old short term rehab patient being seen for the management of her chronic illnesses: hypertension dvt; ckd. She does have chronic pain which is presently being managed. No changes in appetite; no anxiety no insomnia.   Past Medical History:  Diagnosis Date  . Anemia   . Anxiety    pt denies  . Arthritis    KNEES AND BACK  . Bladder incontinence   . Cancer ([place])    basal cell cancer on nose  . Edema of both feet   . Hypertension   . Pneumonia   . PONV (postoperative nausea and vomiting)    PONV X 1 EPISODE, COMES OUT OF ANESTHESIA FAST, SOME AWARENESS DURING END OF COLONSCOPY  . Scoliosis   . VRE (vancomycin resistant enterococcus) culture positive     Past Surgical History:  Procedure Laterality Date  . ABDOMINAL HYSTERECTOMY     2006 VAG HYST  . ABDOMINAL SURGERY    . APPLICATION OF WOUND VAC  03/10/2018   Procedure: APPLICATION OF WOUND VAC;  Surgeon: [name], MD;  Location: [facility];   Service: General;;  . BACK SURGERY     LOWER, X2  . BIOPSY Right 05/02/2017   Procedure: BIOPSY OF RIGHT UPPER EYELID LESION;  Surgeon: [name], MD;  Location: [facility];  Service: Ophthalmology;  Laterality: Right;  . COLECTOMY WITH COLOSTOMY CREATION/HARTMANN PROCEDURE N/A 02/28/2018   Procedure: PARTIAL COLECTOMY WITH COLOSTOMY;  Surgeon: [name], MD;  Location: [facility];  Service: General;  Laterality: N/A;  . EYE SURGERY Bilateral    cataract surgery with lens implant  . FLEXIBLE SIGMOIDOSCOPY N/A 02/21/2018   Procedure: FLEXIBLE SIGMOIDOSCOPY;  Surgeon: [name], MD;  [facility];  Service: Endoscopy;  Laterality: N/A;  . JOINT REPLACEMENT     2011 LT HIP  . LAPAROTOMY N/A 03/10/2018   Procedure: EXPLORATORY LAPAROTOMY;  Surgeon: [name], MD;  Location: [facility];  Service: General;  Laterality: N/A;  . POLYPECTOMY  02/21/2018   Procedure: POLYPECTOMY;  Surgeon: [name], MD;  [facility];  Service: Endoscopy;;  rectum  . RECONSTRUCTION OF EYELID Right 05/02/2017   Procedure: TOTAL RECONSTRUCTION OF UPPER EYELID RIGHT EYE WITH FULL THICKNESS SKIN GRAFT FROM RIGHT POSTERIOR EAR;  Surgeon: [name], MD;  Location: [facility];  Service: Ophthalmology;  Laterality: Right;  . SECONDARY CLOSURE OF WOUND  03/10/2018   Procedure: SECONDARY CLOSURE OF WOUND;  Surgeon:  [name], MD;  Location: [facility];  Service: General;;  . TONSILLECTOMY    . TOTAL KNEE ARTHROPLASTY Right 04/27/2014   Procedure: [place] TOTAL KNEE ARTHROPLASTY;  Surgeon: Rod Can, MD;  Location: [facility];  Service: Orthopedics;  Laterality: Right;  . TUBAL LIGATION     1974  . VENA CAVA FILTER PLACEMENT N/A 06/04/2017   Procedure: INSERTION VENA-CAVA FILTER;  Surgeon: Angelia Mould, MD;  Location: [facility];  Service: Vascular;  Laterality: N/A;    Social History   Socioeconomic History  . Marital status: Married    Spouse name: Not on file  . Number of children:  3  . Years of education: 75  . Highest education level: Associate degree: occupational, Hotel manager, or vocational program  Occupational History  . Occupation: Retired    Comment: Corporate treasurer  Social Needs  . Financial resource strain: Somewhat hard  . Food insecurity:    Worry: Never true    Inability: Never true  . Transportation needs:    Medical: No    Non-medical: No  Tobacco Use  . Smoking status: Never Smoker  . Smokeless tobacco: Never Used  Substance and Sexual Activity  . Alcohol use: Yes    Comment: OCC WINE  . Drug use: No  . Sexual activity: Not on file  Lifestyle  . Physical activity:    Days per week: 0 days    Minutes per session: 0 min  . Stress: Only a little  Relationships  . Social connections:    Talks on phone: More than three times a week    Gets together: Once a week    Attends religious service: Never    Active member of club or organization: No    Attends meetings of clubs or organizations: Never    Relationship status: Married  . Intimate partner violence:    Fear of current or ex partner: No    Emotionally abused: No    Physically abused: No    Forced sexual activity: No  Other Topics Concern  . Not on file  Social History Narrative  . Not on file   Family History  Problem Relation Age of Onset  . Congestive Heart Failure Mother   . Colon cancer Father   . Cancer Brother   . Arthritis Sister   . Asthma Sister   . Diabetes Son   . Diabetes Son       VITAL SIGNS BP 126/60   Pulse 74   Temp 98.1 F (36.7 C) (Oral)   Resp 17   Ht 5\' 6"  (1.676 m)   Wt 211 lb 9.6 oz (96 kg)   BMI 34.15 kg/m   Facility-Administered Encounter Medications as of 03/28/2018  Medication  . ondansetron (ZOFRAN) 4 mg in sodium chloride 0.9 % 50 mL IVPB   Outpatient Encounter Medications as of 03/28/2018  Medication Sig  . acetaminophen (TYLENOL) 325 MG tablet Take 650 mg by mouth every 6 (six) hours as needed.  . Amino Acids-Protein Hydrolys  (FEEDING SUPPLEMENT, PRO-STAT SUGAR FREE 64,) LIQD Take 30 mLs by mouth 3 (three) times daily.  Marland Kitchen amiodarone (PACERONE) 200 MG tablet Take 200 mg by mouth daily.  Marland Kitchen aspirin EC 81 MG tablet Take 81 mg by mouth daily.  . Biotin 1 MG CAPS Take by mouth.  . Cholecalciferol (VITAMIN D3) 5000 units TABS Take 5,000 Units by mouth daily.  . Cranberry 450 MG CAPS Take 1 capsule by mouth daily.   . feeding supplement,  ENSURE ENLIVE, (ENSURE ENLIVE) LIQD Take 237 mLs by mouth 2 (two) times daily between meals.  . ferrous sulfate 325 (65 FE) MG tablet Take 325 mg by mouth daily with breakfast.  . furosemide (LASIX) 20 MG tablet Take 20 mg by mouth daily.  . Glucosamine Sulfate 500 MG TABS Take 2 tablets by mouth daily.   . montelukast (SINGULAIR) 10 MG tablet Take 10 mg by mouth every morning.  . Multiple Vitamin (MULTIVITAMIN) tablet Take 1 tablet by mouth daily.  . NON FORMULARY Diet Type:  NAS  . Nutritional Supplements (JUVEN) POWD Juven powder in packet; 7-7-1.5 gram - Give 1 packet mixed in liquid by mouth twice daily between meals  . ondansetron (ZOFRAN) 4 MG tablet Take 4 mg by mouth every 8 (eight) hours as needed for nausea or vomiting.  . pantoprazole (PROTONIX) 40 MG tablet Take 1 tablet (40 mg total) by mouth daily at 12 noon for 30 days.  . potassium chloride SA (K-DUR,KLOR-CON) 20 MEQ tablet Take 40 mEq by mouth daily.  Marland Kitchen Propylene Glycol-Glycerin (SOOTHE) 0.6-0.6 % SOLN Place 2 drops into both eyes daily as needed.  . vitamin B-12 (CYANOCOBALAMIN) 500 MCG tablet Take 500 mcg by mouth daily.     SIGNIFICANT DIAGNOSTIC EXAMS   PREVIOUS;   02-20-18: ct of abdomen and pelvis:  1. Large amount of stool throughout the colon to the level of the distal sigmoid colon where there is a short segment of concentric, medium density wall thickening. This is concerning for an obstructing short segment mass. It would be unusual for edema due to diverticulitis to involve that short of a segment of bowel.  Correlation with sigmoidoscopy or colonoscopy is recommended. 2. Mild pericolonic soft tissue stranding involving the distal sigmoid colon, most pronounced at the level of maximal distension of the colon by the stool. This is suspicious for mild diverticulitis without abscess. There is minimal diverticulosis in that region of the colon. 3. Cholelithiasis. 4. 5 mm probable hemangioma in the spleen.  02-27-18: ct of abdomen and pelvis:  1. New small amount of free intraperitoneal air, consistent with bowel perforation. 2. Large colonic stool burden with transition point near the rectosigmoid junction, consistent with stricture. No definite soft tissue mass appreciated by CT. This is unchanged in appearance compared to previous study. Recommend correlation with colonoscopy to exclude obstructing colon carcinoma. 3. Mild diverticulitis of proximal sigmoid colon, without significant change. 4. New mild ascites and diffuse body wall edema. New small bilateral pleural effusions and bibasilar atelectasis. 5. Cholelithiasis. No radiographic evidence of cholecystitis.  03-03-18: 2-d echo:   Left Ventricle: The left ventricle has normal systolic function, with an ejection fraction of 60-65%. The cavity size was normal. There is mildly increased left ventricular wall thickness. Left ventricular diastolic parameters were normal Right Ventricle: The right ventricle has normal systolic function. The cavity was normal. There is no increase in right ventricular wall thickness.  03-04-18; chest x-ray: Low lung volumes with cardiomegaly, vascular congestion and tiny Effusions.  NO NEW EXAMS   LABS REVIEWED PREVIOUS;   02-20-18: wbc 11.8; hgb 12.6; hct 40.4 mcv 95.7 plt 187; glucose 145; bun 23; creat 1.06 ;k+ 3.6; na++ 138; ca 9.1 liver normal albumin  02-26-18; wbc 16.1; hgb 11.2; hct 34.3; mcv 94.2; plt 221; glucose 118;bun 14; creat 0.90; k+ 4.0; na++ 134; ca 8.3  blood culture/urine culture: no growth flu Neg  02-27-18: BNP 325.0  03-01-18: wbc 25.0; hgb 10.4; hct 31.7; mcv 94.;3 plt 367; glucose  162; bun 40; creat 1.95; k+ 4.3; na++ 134; ca 7.7 mag  2.4; phos 4.0 03-03-18: wbc 17.0; hgb 8.4; hct 26.3; mcv 92.6 plt 222; glucose 148; bun 53; creat 1.53; k+ 4.0; na++ 139; ca  8.3; protein 4.3; albumin 1.8; liver normal  03-06-18: wbc 17.0; hgb 8.5; hct 26.7; mcv 93.0; plt 278; glucose 184; bun 48; creat 1.38; k+ 3.2; an++ 139; ca 8.0 phos 3.1; albumin 2.0  03-10-18: wbc 13.5; hgb 8.7; hct 27.9; mcv 96.5 plt 263; glucose 115; bun 32; creat 1.25; k+ 3.8; na++ 138; ca 8.4 total protein 4.3; albumin 2.0 liver normal  03-13-18: wbc 7.5; hgb 7.1; hct 23.2; mcv 97.9; plt 148 glucose 124; bun 43; creat 1.24; k+ 4.5; na++ 136; ca 8.6  03-17-18: glucose 115; bun 52; creat 1.08  k+ 44; na++140 ca 9.3  03-24-18: wbc 6.0; hgb 7.2; hct 24.0; mcv 96.8; plt 374; glucose 128; bun 30; creat 1.02; k+ 4.1; na++ 139; ca 9.0; liver normal albumin 2.1   NO NEW LABS.    Review of Systems  Constitutional: Negative for malaise/fatigue.  Respiratory: Negative for cough and shortness of breath.   Cardiovascular: Negative for chest pain, palpitations and leg swelling.  Gastrointestinal: Negative for abdominal pain, constipation and heartburn.  Musculoskeletal: Negative for back pain, joint pain and myalgias.  Skin: Negative.   Neurological: Negative for dizziness.  Psychiatric/Behavioral: The patient is not nervous/anxious.    Mardene Celeste Exam Constitutional:      General: She is not in acute distress.    Appearance: She is well-developed. She is not diaphoretic.  Neck:     Musculoskeletal: Neck supple.     Thyroid: No thyromegaly.  Cardiovascular:     Rate and Rhythm: Normal rate and regular rhythm.     Pulses: Normal pulses.     Heart sounds: Normal heart sounds.     Comments: History of IVC filter placement  Pulmonary:     Effort: Pulmonary effort is normal. No respiratory distress.     Breath sounds: Normal breath sounds.   Abdominal:     General: Bowel sounds are normal. There is no distension.     Palpations: Abdomen is soft.     Tenderness: There is no abdominal tenderness.     Comments: Colostomy present   Musculoskeletal:     Right lower leg: Edema present.     Left lower leg: Edema present.     Comments: 2+ bilateral lower extremity edema Is able to move all extremities  History of right knee replacement 2016   Lymphadenopathy:     Cervical: No cervical adenopathy.  Skin:    General: Skin is warm and dry.     Comments: Wound vac in place in abdominal wound    Neurological:     Mental Status: She is alert and oriented to person, place, and time.  Psychiatric:        Mood and Affect: Mood normal.      ASSESSMENT/ PLAN:  TODAY:   1. Essential hypertension: is stable b/p 126/60 will continue to monitor  2. Chronic deep vein thrombosis (DVT) right lower extremity unspecified vein: is stable is status post IVC filter placement will continue asa 81 mg daily  3. Chronic kidney disease stage 3 (moderate): is stable bun 20 creat 1.02 will monitor     PREVIOUS   4. Wound dehiscence surgical; sequela: is stable has wound vac in place will monitor  5. New onset atrial fibrillation: heart rate is stable will continue  amiodarone 200 mg twice daily for rate control and asa 81 mg daily   6. Acute blood loss anemia: is without change: hgb 7.2; will continue to monitor   7. Chronic non-seasonal allergic rhinitis: is stable will continue singulair 10 mg daily   8. GERD without esophagitis: is stable will continue protonix 40 mg daily   9. Severe protein calorie malnutrition: is without change: total protein 4.3; albumin 2.1; will continue prostat 30 cc three times daily; ensure twice daily and juven twice daily   10. Venous stasis of both lower extremities: is without change will continue lasix 40 mg daily with k+ 40 meq daily   11. Large bowel obstruction/diverticulitis of large intestine with  perforation. Is status post exploratory lap with colostomy: is stable will continue wound care as indicated and will continue therapy as directed to improve upon independence with her adls.    MD is aware of resident's narcotic use and is in agreement with current plan of care. We will attempt to wean resident as apropriate   Ok Edwards NP [facility]  Contact [phone] Monday through Friday 8am- 5pm  After hours call [phone]

## 2018-04-03 ENCOUNTER — Encounter: Payer: Self-pay | Admitting: Internal Medicine

## 2018-04-03 ENCOUNTER — Non-Acute Institutional Stay (SKILLED_NURSING_FACILITY): Payer: Medicare Other | Admitting: Internal Medicine

## 2018-04-03 DIAGNOSIS — N183 Chronic kidney disease, stage 3 unspecified: Secondary | ICD-10-CM

## 2018-04-03 DIAGNOSIS — T8131XS Disruption of external operation (surgical) wound, not elsewhere classified, sequela: Secondary | ICD-10-CM | POA: Diagnosis not present

## 2018-04-03 DIAGNOSIS — E43 Unspecified severe protein-calorie malnutrition: Secondary | ICD-10-CM

## 2018-04-03 DIAGNOSIS — I4891 Unspecified atrial fibrillation: Secondary | ICD-10-CM

## 2018-04-03 NOTE — Progress Notes (Signed)
Location:  [facility] Room Number: 129 P Place of Service:  SNF [phone]) Provider:  Veleta Miners, MD  [name], DO  Patient Care Team: [name], DO as PCP - General (Family Medicine) Angelia Mould, MD as Consulting Physician (Vascular Surgery)  Extended Emergency Contact Information Primary Emergency Contact: Thomas,Virgil Address: [address] [place] [address] Johnnette Litter of [place] Phone: [phone] Mobile Phone: [phone] Relation: Spouse Secondary Emergency Contact: Shaver,Timothy          [place],  [place] of [place] Phone: [phone] Relation: Son  Code Status:  Full Code Goals of care: Advanced Directive information Advanced Directives 04/03/2018  Does Patient Have a Medical Advance Directive? No  Type of Advance Directive -  Does patient want to make changes to medical advance directive? No - Patient declined  Copy of [place] in Chart? -  Would patient like information on creating a medical advance directive? No - Patient declined     Chief Complaint  Patient presents with   Acute Visit    Discuss multiple Complains    HPI:  Pt is a 84 y.o. female seen today for an acute visit for Many issues she is having.  Patient was in the hospital from 02/06-02/27 for bowel Obstruction, s/p Hartmans procedure  And Wound Dehiscence  Patient has h/o Hypertension, DVT s/P IVC filter, Chronic back Pain, LE swelling with Left Foot Venous Ulcer,   She came to ED for Abdominal pain and Constipation. She was found to have Colonic Obstruction on CT scan. She underwent sigmoidoscopy which showed obstruction in the sigmoid colon with market edema.  Repeat CT scan showed free air.  She underwent Hartman's procedure on  2/14.for  Perforation and Fecal peritonitis.She a developed hypotension and new onset A. fib after her surgery.  She converted to NSR on amiodarone. She also  needed Pressors and Vent support. Later she developed wound dehiscence and was taken to the OR again for reclosure of her abdominal wound.  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 She was also treated with Ertapenem , linezolid and fluconazole.  Patient is doing well in facility. She is walking with therapy few steps. But she had number of complains today. She is hearing some pain in her right ear. She also has dry eyes and want Refresh Eye Drops Her main complain is Abdominal discomfort and Nausea with decrease appetite. She has no Vomiting. Her Bowels through the colostomy bag are moving well. She has no Fever, . She does have some cough but no SOB and she is off her oxygen   Past Medical History:  Diagnosis Date   Anemia    Anxiety    pt denies   Arthritis    KNEES AND BACK   Bladder incontinence    Cancer ([place])    basal cell cancer on nose   Edema of both feet    Hypertension    Pneumonia    PONV (postoperative nausea and vomiting)    PONV X 1 EPISODE, COMES OUT OF ANESTHESIA FAST, SOME AWARENESS DURING END OF COLONSCOPY   Scoliosis    VRE (vancomycin resistant enterococcus) culture positive    Past Surgical History:  Procedure Laterality Date   ABDOMINAL HYSTERECTOMY     2006 VAG HYST   ABDOMINAL SURGERY     APPLICATION OF WOUND VAC  03/10/2018  Procedure: APPLICATION OF WOUND VAC;  Surgeon: [name], MD;  Location: [facility];  Service: General;;   BACK SURGERY     LOWER, X2   BIOPSY Right 05/02/2017   Procedure: BIOPSY OF RIGHT UPPER EYELID LESION;  Surgeon: [name], MD;  Location: [facility];  Service: Ophthalmology;  Laterality: Right;   COLECTOMY WITH COLOSTOMY CREATION/HARTMANN PROCEDURE N/A 02/28/2018   Procedure: PARTIAL COLECTOMY WITH COLOSTOMY;  Surgeon: [name], MD;  Location: [facility];  Service: General;  Laterality: N/A;   EYE SURGERY Bilateral    cataract surgery with lens implant    FLEXIBLE SIGMOIDOSCOPY N/A 02/21/2018   Procedure: FLEXIBLE SIGMOIDOSCOPY;  Surgeon: [name], MD;  [facility];  Service: Endoscopy;  Laterality: N/A;   JOINT REPLACEMENT     2011 LT HIP   LAPAROTOMY N/A 03/10/2018   Procedure: EXPLORATORY LAPAROTOMY;  Surgeon: [name], MD;  Location: [facility];  Service: General;  Laterality: N/A;   POLYPECTOMY  02/21/2018   Procedure: POLYPECTOMY;  Surgeon: [name], MD;  [facility];  Service: Endoscopy;;  rectum   RECONSTRUCTION OF EYELID Right 05/02/2017   Procedure: TOTAL RECONSTRUCTION OF UPPER EYELID RIGHT EYE WITH FULL THICKNESS SKIN GRAFT FROM RIGHT POSTERIOR EAR;  Surgeon: [name], MD;  Location: [facility];  Service: Ophthalmology;  Laterality: Right;   SECONDARY CLOSURE OF WOUND  03/10/2018   Procedure: SECONDARY CLOSURE OF WOUND;  Surgeon: [name], MD;  Location: [facility];  Service: General;;   TONSILLECTOMY     TOTAL KNEE ARTHROPLASTY Right 04/27/2014   Procedure: [place] TOTAL KNEE ARTHROPLASTY;  Surgeon: Rod Can, MD;  Location: [facility];  Service: Orthopedics;  Laterality: Right;   TUBAL LIGATION     1974   VENA CAVA FILTER PLACEMENT N/A 06/04/2017   Procedure: INSERTION VENA-CAVA FILTER;  Surgeon: Angelia Mould, MD;  Location: [facility] OR;  Service: Vascular;  Laterality: N/A;    Allergies  Allergen Reactions   Penicillins Rash and Other (See Comments)    Has patient had a PCN reaction causing immediate rash, facial/tongue/throat swelling, SOB or lightheadedness with hypotension: ###Yes## Has patient had a PCN reaction causing severe rash involving mucus membranes or skin necrosis: No Has patient had a PCN reaction that required hospitalization No Has patient had a PCN reaction occurring within the last 10 years: No If all of the above answers are "NO", then may proceed with Cephalosporin use.    Vancomycin     Itching, SOB     Facility-Administered Encounter Medications as  of 04/03/2018  Medication   ondansetron (ZOFRAN) 4 mg in sodium chloride 0.9 % 50 mL IVPB   Outpatient Encounter Medications as of 04/03/2018  Medication Sig   acetaminophen (TYLENOL) 325 MG tablet Take 650 mg by mouth every 6 (six) hours as needed.   Amino Acids-Protein Hydrolys (FEEDING SUPPLEMENT, PRO-STAT SUGAR FREE 64,) LIQD Take 30 mLs by mouth 3 (three) times daily.   amiodarone (PACERONE) 200 MG tablet Take 200 mg by mouth 2 (two) times daily.    aspirin EC 81 MG tablet Take 81 mg by mouth daily.   Biotin 1 MG CAPS Take by mouth.   Cholecalciferol (VITAMIN D3) 5000 units TABS Take 5,000 Units by mouth daily.   Cranberry 450 MG CAPS Take 1 capsule by mouth daily.    feeding supplement, ENSURE ENLIVE, (ENSURE ENLIVE) LIQD Take 237 mLs by mouth 2 (two) times daily between meals.   ferrous sulfate 325 (65  FE) MG tablet Take 325 mg by mouth daily with breakfast.   furosemide (LASIX) 20 MG tablet Take 20 mg by mouth daily.   Glucosamine Sulfate 500 MG TABS Take 2 tablets by mouth daily.    montelukast (SINGULAIR) 10 MG tablet Take 10 mg by mouth every morning.   Multiple Vitamin (MULTIVITAMIN) tablet Take 1 tablet by mouth daily.   NON FORMULARY Diet Type:  NAS   Nutritional Supplements (JUVEN) POWD Juven powder in packet; 7-7-1.5 gram - Give 1 packet mixed in liquid by mouth twice daily between meals   ondansetron (ZOFRAN) 4 MG tablet Take 4 mg by mouth every 8 (eight) hours as needed for nausea or vomiting.   pantoprazole (PROTONIX) 40 MG tablet Take 1 tablet (40 mg total) by mouth daily at 12 noon for 30 days.   potassium chloride SA (K-DUR,KLOR-CON) 20 MEQ tablet Take 40 mEq by mouth daily.   Propylene Glycol-Glycerin (SOOTHE) 0.6-0.6 % SOLN Place 2 drops into both eyes daily as needed.   vitamin B-12 (CYANOCOBALAMIN) 500 MCG tablet Take 500 mcg by mouth daily.    Review of Systems  Constitutional: Positive for activity change, appetite change and unexpected  weight change.  HENT: Positive for congestion and ear pain.   Eyes: Positive for pain.  Respiratory: Positive for cough.   Cardiovascular: Positive for leg swelling.  Gastrointestinal: Positive for abdominal pain and nausea.  Genitourinary: Negative.   Musculoskeletal: Positive for back pain.  Skin: Positive for wound.  Neurological: Positive for weakness.  Psychiatric/Behavioral: Positive for sleep disturbance.    Immunization History  Administered Date(s) Administered   Influenza-Unspecified 09/24/2007, 10/11/2009, 10/03/2010, 12/02/2011, 11/03/2013   Pneumococcal Conjugate-13 11/03/2013   Pneumococcal Polysaccharide-23 11/05/2002   Pertinent  Health Maintenance Due  Topic Date Due   INFLUENZA VACCINE  12/10/2018 (Originally 08/15/2017)   DEXA SCAN  12/20/2018 (Originally 12/25/2001)   PNA vac Low Risk Adult  Completed   Fall Risk  02/20/2018 01/28/2018 12/19/2017 12/09/2017  Falls in the past year? 1 1 1  0  Number falls in past yr: 1 1 1  -  Injury with Fall? 1 0 0 -  Risk for fall due to : - - Impaired balance/gait;Impaired mobility -  Follow up - - Education provided;Falls prevention discussed -   Functional Status Survey:    Vitals:   04/03/18 1037  BP: (!) 145/64  Pulse: 64  Resp: 16  Temp: 97.7 F (36.5 C)  Weight: 208 lb 6.4 oz (94.5 kg)  Height: 5\' 6"  (1.676 m)   Body mass index is 33.64 kg/m. Physical Exam Vitals signs reviewed.  Constitutional:      Appearance: Normal appearance.  HENT:     Head: Normocephalic.     Ears:     Comments: Congested TM in Left ear    Nose: Nose normal.     Mouth/Throat:     Mouth: Mucous membranes are moist.     Pharynx: Oropharynx is clear.  Eyes:     Pupils: Pupils are equal, round, and reactive to light.  Neck:     Musculoskeletal: Neck supple.  Cardiovascular:     Rate and Rhythm: Normal rate and regular rhythm.     Pulses: Normal pulses.     Heart sounds: Normal heart sounds.  Pulmonary:     Effort:  Pulmonary effort is normal. No respiratory distress.     Breath sounds: Normal breath sounds. No wheezing or rales.  Abdominal:     General: Abdomen is flat.  Palpations: Abdomen is soft.     Comments: Bowel Sounds decreased Has Vac in her Abdomen Wound  Musculoskeletal:        General: No swelling.  Skin:    General: Skin is warm.  Neurological:     General: No focal deficit present.     Mental Status: She is alert and oriented to person, place, and time.  Psychiatric:        Mood and Affect: Mood normal.        Thought Content: Thought content normal.     Labs reviewed: Recent Labs    02/28/18 0601 03/01/18 0419  03/07/18 0526 03/08/18 6789 03/09/18 0610  03/13/18 0420 03/17/18 0631 03/24/18 0634  NA 133* 134*   < > 141 140 139   < > 136 140 139  K 4.5 4.3   < > 3.5 3.5 3.5   < > 4.5 4.4 4.1  CL 96* 105   < > 103 99 99   < > 96* 103 106  CO2 24 21*   < > 31 31 32   < > 32 31 26  GLUCOSE 100* 162*   < > 120* 131* 120*   < > 124* 115* 128*  BUN 31* 40*   < > 43* 37* 34*   < > 43* 52* 30*  CREATININE 2.23* 1.95*   < > 1.31* 1.21* 1.24*   < > 1.24* 1.08* 1.02*  CALCIUM 8.8* 7.7*   < > 8.2* 8.3* 8.3*   < > 8.6* 9.3 9.0  MG 2.5* 2.4  --  1.8  --   --   --   --   --   --   PHOS  --  4.0   < > 3.3 3.1 3.2  --   --   --   --    < > = values in this interval not displayed.   Recent Labs    03/04/18 0618  03/09/18 0610 03/10/18 0434 03/24/18 0634  AST 11*  --   --  15 13*  ALT 8  --   --  9 11  ALKPHOS 33*  --   --  41 66  BILITOT 0.6  --   --  0.7 0.3  PROT 5.6*  --   --  4.3* 4.9*  ALBUMIN 3.3*   3.2*   < > 2.0* 2.0* 2.1*   < > = values in this interval not displayed.   Recent Labs    06/04/17 0151  12/09/17 1041 01/28/18 1238  03/12/18 0412 03/13/18 0420 03/24/18 0634  WBC 7.8   < > 6.7 7.1   < > 10.4 7.5 6.0  NEUTROABS 4.6  --  3.8 4.5  --   --   --   --   HGB 8.6*   < > 13.4 13.8   < > 7.5* 7.1* 7.2*  HCT 26.6*   < > 39.5 40.8   < > 24.7* 23.2* 24.0*   MCV 94.0   < > 91 91   < > 96.5 97.9 96.8  PLT 137*   < > 214 173   < > 174 148* 374   < > = values in this interval not displayed.   No results found for: TSH No results found for: HGBA1C No results found for: CHOL, HDL, LDLCALC, LDLDIRECT, TRIG, CHOLHDL  Significant Diagnostic Results in last 30 days:  No results found.  Assessment/Plan S/P hartmann procedure with Colostomy bag and S/P Peritoneal  Was and wound vac. Patient abdomen exam is benign but due to her complaints will get abdomen x-ray. Discussed with dietitian.  Patient is eating 50% of her meal.  She is also having Ensure and Protostat.   Discussed with the nurse and her wound is looking good and probably would not need wound VAC anymore We have contacted Dr. Arnoldo Morale office and he will come and visit patient in the facility. Will use Zofran PRN for Nausea  New Onset Atrial fibrillation Patient is on amiodarone In sinus rhythm EF of 60% with LAE Per cardiology not on any anticoagulation Patient has appointment to follow-up with them CKD BUN has improved Creatinine near her baseline  Bilateral Edema Edema improved Patient has lost almost 20 pounds since she has been here but it seems more like due to fluid Anemia Hemoglobin is low but stable We will continue her on iron Generalized weakness protein malnutrition Working with  Therapy On Prostat and Ensure Lives with her husband was not very active before either due to chronic  Ear pain We will start her on Ocuflox eardrops first 5 to 7 days Dry eyes  I started her on refresh eye drops  Family/ staff Communication:   Labs/tests ordered:  Will repeat CMP and CBC  Total time spent in this patient care encounter was 45_ minutes; greater than 50% of the visit spent counseling patient,and staff reviewing records , Labs and coordinating care for problems addressed at this encounter.

## 2018-04-04 ENCOUNTER — Non-Acute Institutional Stay (SKILLED_NURSING_FACILITY): Payer: Medicare Other | Admitting: Adult Health

## 2018-04-04 ENCOUNTER — Encounter (HOSPITAL_COMMUNITY)
Admission: RE | Admit: 2018-04-04 | Discharge: 2018-04-04 | Disposition: A | Discharge status: Home / Self Care | Payer: Medicare Other | Source: Skilled Nursing Facility | Attending: Internal Medicine | Admitting: Internal Medicine

## 2018-04-04 ENCOUNTER — Encounter: Payer: Self-pay | Admitting: Adult Health

## 2018-04-04 DIAGNOSIS — T8131XS Disruption of external operation (surgical) wound, not elsewhere classified, sequela: Secondary | ICD-10-CM | POA: Diagnosis not present

## 2018-04-04 DIAGNOSIS — K5909 Other constipation: Secondary | ICD-10-CM | POA: Diagnosis not present

## 2018-04-04 DIAGNOSIS — I4891 Unspecified atrial fibrillation: Secondary | ICD-10-CM | POA: Diagnosis not present

## 2018-04-04 DIAGNOSIS — N183 Chronic kidney disease, stage 3 (moderate): Secondary | ICD-10-CM | POA: Insufficient documentation

## 2018-04-04 DIAGNOSIS — I10 Essential (primary) hypertension: Secondary | ICD-10-CM | POA: Insufficient documentation

## 2018-04-04 DIAGNOSIS — D62 Acute posthemorrhagic anemia: Secondary | ICD-10-CM | POA: Diagnosis not present

## 2018-04-04 DIAGNOSIS — T8132XD Disruption of internal operation (surgical) wound, not elsewhere classified, subsequent encounter: Secondary | ICD-10-CM | POA: Insufficient documentation

## 2018-04-04 DIAGNOSIS — M19012 Primary osteoarthritis, left shoulder: Secondary | ICD-10-CM | POA: Insufficient documentation

## 2018-04-04 LAB — COMPREHENSIVE METABOLIC PANEL
ALT: 17 U/L (ref 0–44)
AST: 18 U/L (ref 15–41)
Albumin: 2.7 g/dL — ABNORMAL LOW (ref 3.5–5.0)
Alkaline Phosphatase: 72 U/L (ref 38–126)
Anion gap: 10 (ref 5–15)
BUN: 35 mg/dL — ABNORMAL HIGH (ref 8–23)
CO2: 27 mmol/L (ref 22–32)
Calcium: 9.4 mg/dL (ref 8.9–10.3)
Chloride: 103 mmol/L (ref 98–111)
Creatinine, Ser: 1.17 mg/dL — ABNORMAL HIGH (ref 0.44–1.00)
GFR calc Af Amer: 51 mL/min — ABNORMAL LOW (ref >60)
GFR calc non Af Amer: 44 mL/min — ABNORMAL LOW (ref >60)
Glucose, Bld: 118 mg/dL — ABNORMAL HIGH (ref 70–99)
Potassium: 4 mmol/L (ref 3.5–5.1)
Sodium: 140 mmol/L (ref 135–145)
Total Bilirubin: 0.3 mg/dL (ref 0.3–1.2)
Total Protein: 5.6 g/dL — ABNORMAL LOW (ref 6.5–8.1)

## 2018-04-04 LAB — CBC
HCT: 28.5 % — ABNORMAL LOW (ref 36.0–46.0)
Hemoglobin: 8.8 g/dL — ABNORMAL LOW (ref 12.0–15.0)
MCH: 29.5 pg (ref 26.0–34.0)
MCHC: 30.9 g/dL (ref 30.0–36.0)
MCV: 95.6 fL (ref 80.0–100.0)
PLATELETS: 299 10*3/uL (ref 150–400)
RBC: 2.98 MIL/uL — ABNORMAL LOW (ref 3.87–5.11)
RDW: 15.7 % — AB (ref 11.5–15.5)
WBC: 8.2 10*3/uL (ref 4.0–10.5)
nRBC: 0 % (ref 0.0–0.2)

## 2018-04-04 NOTE — Progress Notes (Signed)
Location:   [facility] Room Number: 129 P Place of Service:  SNF (31)   CODE STATUS: Full Code  Allergies  Allergen Reactions  . Penicillins Rash and Other (See Comments)    Has patient had a PCN reaction causing immediate rash, facial/tongue/throat swelling, SOB or lightheadedness with hypotension: ###Yes## Has patient had a PCN reaction causing severe rash involving mucus membranes or skin necrosis: No Has patient had a PCN reaction that required hospitalization No Has patient had a PCN reaction occurring within the last 10 years: No If all of the above answers are "NO", then may proceed with Cephalosporin use.   . Vancomycin     Itching, SOB     Chief Complaint  Patient presents with  . Medical Management of Chronic Issues    New onset atrial fibrillation; chronic constipation; postoperative wound dehiscence, sequela; acute blood loss anemia. Weekly follow up for the first 30 days post hospitalization.     HPI:  She is a 84 year old short term rehab patient being seen for the management of her chronic illnesses: afib; constipation; post op wound; anemia. She is complaining of constipation. She denies any uncontrolled pain; no changes in appetite; no anxiety. No reports of fevers present.   Past Medical History:  Diagnosis Date  . Anemia   . Anxiety    pt denies  . Arthritis    KNEES AND BACK  . Bladder incontinence   . Cancer ([place])    basal cell cancer on nose  . Edema of both feet   . Hypertension   . Pneumonia   . PONV (postoperative nausea and vomiting)    PONV X 1 EPISODE, COMES OUT OF ANESTHESIA FAST, SOME AWARENESS DURING END OF COLONSCOPY  . Scoliosis   . VRE (vancomycin resistant enterococcus) culture positive     Past Surgical History:  Procedure Laterality Date  . ABDOMINAL HYSTERECTOMY     2006 VAG HYST  . ABDOMINAL SURGERY    . APPLICATION OF WOUND VAC  03/10/2018   Procedure: APPLICATION OF WOUND VAC;  Surgeon:  [name], MD;  Location: [facility];  Service: General;;  . BACK SURGERY     LOWER, X2  . BIOPSY Right 05/02/2017   Procedure: BIOPSY OF RIGHT UPPER EYELID LESION;  Surgeon: [name], MD;  Location: [facility];  Service: Ophthalmology;  Laterality: Right;  . COLECTOMY WITH COLOSTOMY CREATION/HARTMANN PROCEDURE N/A 02/28/2018   Procedure: PARTIAL COLECTOMY WITH COLOSTOMY;  Surgeon: [name], MD;  Location: [facility];  Service: General;  Laterality: N/A;  . EYE SURGERY Bilateral    cataract surgery with lens implant  . FLEXIBLE SIGMOIDOSCOPY N/A 02/21/2018   Procedure: FLEXIBLE SIGMOIDOSCOPY;  Surgeon: [name], MD;  [facility];  Service: Endoscopy;  Laterality: N/A;  . JOINT REPLACEMENT     2011 LT HIP  . LAPAROTOMY N/A 03/10/2018   Procedure: EXPLORATORY LAPAROTOMY;  Surgeon: [name], MD;  Location: [facility];  Service: General;  Laterality: N/A;  . POLYPECTOMY  02/21/2018   Procedure: POLYPECTOMY;  Surgeon: [name], MD;  [facility];  Service: Endoscopy;;  rectum  . RECONSTRUCTION OF EYELID Right 05/02/2017   Procedure: TOTAL RECONSTRUCTION OF UPPER EYELID RIGHT EYE WITH FULL THICKNESS SKIN GRAFT FROM RIGHT POSTERIOR EAR;  Surgeon: [name], MD;  Location: [facility];  Service: Ophthalmology;  Laterality: Right;  . SECONDARY CLOSURE OF WOUND  03/10/2018   Procedure: SECONDARY CLOSURE OF  WOUND;  Surgeon: [name], MD;  Location: [facility];  Service: General;;  . TONSILLECTOMY    . TOTAL KNEE ARTHROPLASTY Right 04/27/2014   Procedure: [place] TOTAL KNEE ARTHROPLASTY;  Surgeon: Rod Can, MD;  Location: [facility];  Service: Orthopedics;  Laterality: Right;  . TUBAL LIGATION     1974  . VENA CAVA FILTER PLACEMENT N/A 06/04/2017   Procedure: INSERTION VENA-CAVA FILTER;  Surgeon: Angelia Mould, MD;  Location[facility] OR;  Service: Vascular;  Laterality: N/A;    Social History   Socioeconomic History  . Marital status: Married    Spouse  name: Not on file  . Number of children: 3  . Years of education: 78  . Highest education level: Associate degree: occupational, Hotel manager, or vocational program  Occupational History  . Occupation: Retired    Comment: Corporate treasurer  Social Needs  . Financial resource strain: Somewhat hard  . Food insecurity:    Worry: Never true    Inability: Never true  . Transportation needs:    Medical: No    Non-medical: No  Tobacco Use  . Smoking status: Never Smoker  . Smokeless tobacco: Never Used  Substance and Sexual Activity  . Alcohol use: Yes    Comment: OCC WINE  . Drug use: No  . Sexual activity: Not on file  Lifestyle  . Physical activity:    Days per week: 0 days    Minutes per session: 0 min  . Stress: Only a little  Relationships  . Social connections:    Talks on phone: More than three times a week    Gets together: Once a week    Attends religious service: Never    Active member of club or organization: No    Attends meetings of clubs or organizations: Never    Relationship status: Married  . Intimate partner violence:    Fear of current or ex partner: No    Emotionally abused: No    Physically abused: No    Forced sexual activity: No  Other Topics Concern  . Not on file  Social History Narrative  . Not on file   Family History  Problem Relation Age of Onset  . Congestive Heart Failure Mother   . Colon cancer Father   . Cancer Brother   . Arthritis Sister   . Asthma Sister   . Diabetes Son   . Diabetes Son       VITAL SIGNS BP (!) 146/73   Pulse 70   Temp 98.2 F (36.8 C)   Resp 20   Ht 5\' 6"  (1.676 m)   Wt 202 lb 3.2 oz (91.7 kg)   BMI 32.64 kg/m   Facility-Administered Encounter Medications as of 04/04/2018  Medication  . ondansetron (ZOFRAN) 4 mg in sodium chloride 0.9 % 50 mL IVPB   Outpatient Encounter Medications as of 04/04/2018  Medication Sig  . acetaminophen (TYLENOL) 325 MG tablet Take 650 mg by mouth every 6 (six) hours as  needed.  Marland Kitchen amiodarone (PACERONE) 200 MG tablet Take 200 mg by mouth 2 (two) times daily.   Marland Kitchen aspirin EC 81 MG tablet Take 81 mg by mouth daily.  . Biotin 1 MG CAPS Take by mouth.  . Cholecalciferol (VITAMIN D3) 5000 units TABS Take 5,000 Units by mouth daily.  . Cranberry 450 MG CAPS Take 1 capsule by mouth daily.   . ferrous sulfate 325 (65 FE) MG tablet Take 325 mg by mouth daily with breakfast.  .  furosemide (LASIX) 20 MG tablet Take 20 mg by mouth daily.  . Glucosamine Sulfate 500 MG TABS Take 2 tablets by mouth daily.   . montelukast (SINGULAIR) 10 MG tablet Take 10 mg by mouth every morning.  . Multiple Vitamin (MULTIVITAMIN) tablet Take 1 tablet by mouth daily.  . NON FORMULARY Diet Type:  NAS  . Nutritional Supplements (ENSURE ENLIVE PO) Take 1 Bottle by mouth 3 (three) times daily between meals.  . Nutritional Supplements (JUVEN) POWD Juven powder in packet; 7-7-1.5 gram - Give 1 packet mixed in liquid by mouth twice daily between meals  . ofloxacin (FLOXIN) 0.3 % OTIC solution 2 drops 2 (two) times daily. X 7 days  . ondansetron (ZOFRAN) 4 MG tablet Take 4 mg by mouth every 8 (eight) hours as needed for nausea or vomiting.  . pantoprazole (PROTONIX) 40 MG tablet Take 1 tablet (40 mg total) by mouth daily at 12 noon for 30 days.  . potassium chloride SA (K-DUR,KLOR-CON) 20 MEQ tablet Take 40 mEq by mouth daily.  Marland Kitchen Propylene Glycol-Glycerin (SOOTHE) 0.6-0.6 % SOLN Place 2 drops into both eyes daily as needed.  . vitamin B-12 (CYANOCOBALAMIN) 500 MCG tablet Take 500 mcg by mouth daily.     SIGNIFICANT DIAGNOSTIC EXAMS  PREVIOUS;   02-20-18: ct of abdomen and pelvis:  1. Large amount of stool throughout the colon to the level of the distal sigmoid colon where there is a short segment of concentric, medium density wall thickening. This is concerning for an obstructing short segment mass. It would be unusual for edema due to diverticulitis to involve that short of a segment of bowel.  Correlation with sigmoidoscopy or colonoscopy is recommended. 2. Mild pericolonic soft tissue stranding involving the distal sigmoid colon, most pronounced at the level of maximal distension of the colon by the stool. This is suspicious for mild diverticulitis without abscess. There is minimal diverticulosis in that region of the colon. 3. Cholelithiasis. 4. 5 mm probable hemangioma in the spleen.  02-27-18: ct of abdomen and pelvis:  1. New small amount of free intraperitoneal air, consistent with bowel perforation. 2. Large colonic stool burden with transition point near the rectosigmoid junction, consistent with stricture. No definite soft tissue mass appreciated by CT. This is unchanged in appearance compared to previous study. Recommend correlation with colonoscopy to exclude obstructing colon carcinoma. 3. Mild diverticulitis of proximal sigmoid colon, without significant change. 4. New mild ascites and diffuse body wall edema. New small bilateral pleural effusions and bibasilar atelectasis. 5. Cholelithiasis. No radiographic evidence of cholecystitis.  03-03-18: 2-d echo:   Left Ventricle: The left ventricle has normal systolic function, with an ejection fraction of 60-65%. The cavity size was normal. There is mildly increased left ventricular wall thickness. Left ventricular diastolic parameters were normal Right Ventricle: The right ventricle has normal systolic function. The cavity was normal. There is no increase in right ventricular wall thickness.  03-04-18; chest x-ray: Low lung volumes with cardiomegaly, vascular congestion and tiny Effusions.  NO NEW EXAMS   LABS REVIEWED PREVIOUS;   02-20-18: wbc 11.8; hgb 12.6; hct 40.4 mcv 95.7 plt 187; glucose 145; bun 23; creat 1.06 ;k+ 3.6; na++ 138; ca 9.1 liver normal albumin  02-26-18; wbc 16.1; hgb 11.2; hct 34.3; mcv 94.2; plt 221; glucose 118;bun 14; creat 0.90; k+ 4.0; na++ 134; ca 8.3  blood culture/urine culture: no growth flu Neg  02-27-18: BNP 325.0  03-01-18: wbc 25.0; hgb 10.4; hct 31.7; mcv 94.;3 plt 367; glucose 162;  bun 40; creat 1.95; k+ 4.3; na++ 134; ca 7.7 mag  2.4; phos 4.0 03-03-18: wbc 17.0; hgb 8.4; hct 26.3; mcv 92.6 plt 222; glucose 148; bun 53; creat 1.53; k+ 4.0; na++ 139; ca  8.3; protein 4.3; albumin 1.8; liver normal  03-06-18: wbc 17.0; hgb 8.5; hct 26.7; mcv 93.0; plt 278; glucose 184; bun 48; creat 1.38; k+ 3.2; an++ 139; ca 8.0 phos 3.1; albumin 2.0  03-10-18: wbc 13.5; hgb 8.7; hct 27.9; mcv 96.5 plt 263; glucose 115; bun 32; creat 1.25; k+ 3.8; na++ 138; ca 8.4 total protein 4.3; albumin 2.0 liver normal  03-13-18: wbc 7.5; hgb 7.1; hct 23.2; mcv 97.9; plt 148 glucose 124; bun 43; creat 1.24; k+ 4.5; na++ 136; ca 8.6  03-17-18: glucose 115; bun 52; creat 1.08  k+ 44; na++140 ca 9.3  03-24-18: wbc 6.0; hgb 7.2; hct 24.0; mcv 96.8; plt 374; glucose 128; bun 30; creat 1.02; k+ 4.1; na++ 139; ca 9.0; liver normal albumin 2.1   TODAY:   04-04-18: wbc 8.2; hgb 8.8; hct 28.5; mcv 95.6 plt 299; glucose 118; bun 35; creat 1.17; k+ 4.0; na++ 140;ca 9.4 liver normal albumin 2.7   Review of Systems  Constitutional: Negative for malaise/fatigue.  Respiratory: Negative for cough and shortness of breath.   Cardiovascular: Negative for chest pain, palpitations and leg swelling.  Gastrointestinal: Positive for constipation. Negative for abdominal pain and heartburn.  Musculoskeletal: Negative for back pain, joint pain and myalgias.  Skin: Negative.   Neurological: Negative for dizziness.  Psychiatric/Behavioral: The patient is not nervous/anxious.     Physical Exam Constitutional:      General: She is not in acute distress.    Appearance: She is well-developed. She is not diaphoretic.  Neck:     Musculoskeletal: Neck supple.     Thyroid: No thyromegaly.  Cardiovascular:     Rate and Rhythm: Normal rate and regular rhythm.     Pulses: Normal pulses.     Heart sounds: Normal heart sounds.     Comments: IVC  filter in place  Pulmonary:     Effort: Pulmonary effort is normal. No respiratory distress.     Breath sounds: Normal breath sounds.  Abdominal:     General: Bowel sounds are normal. There is no distension.     Palpations: Abdomen is soft.     Tenderness: There is no abdominal tenderness.     Comments: Colostomy   Musculoskeletal:     Right lower leg: Edema present.     Left lower leg: Edema present.     Comments: 2+ bilateral lower extremity edema Is able to move all extremities  History of right knee replacement 2016    Lymphadenopathy:     Cervical: No cervical adenopathy.  Skin:    General: Skin is warm and dry.     Comments: Wound vac in place in abdominal wound     Neurological:     Mental Status: She is alert and oriented to person, place, and time.  Psychiatric:        Mood and Affect: Mood normal.     ASSESSMENT/ PLAN:  TODAY:   1. Wound dehiscence surgical; sequela: is stable wound is healing wound vac in place will monitor  2. New onset atrial fibrillation: heart rate is stable will continue amiodarone 200 mg twice daily for rate control and asa 81 mg daily   3. Acute blood loss anemia: is stable hgb 8,8 will continue iron daily   4. Chronic  constipation: is worse: will begin colace daily and will monitor    PREVIOUS   5. Chronic non-seasonal allergic rhinitis: is stable will continue singulair 10 mg daily   6. GERD without esophagitis: is stable will continue protonix 40 mg daily   7. Severe protein calorie malnutrition: is without change: total protein 5.6; albumin 2.7; will continue ensure three times daily   8. Venous stasis of both lower extremities: is without change will continue lasix 40 mg daily with k+ 40 meq daily   9. Large bowel obstruction/diverticulitis of large intestine with perforation. Is status post exploratory lap with colostomy: is stable will continue wound care as indicated and will continue therapy as directed to improve upon  independence with her adls.   10. Essential hypertension: is stable b/p 146/73 will continue to monitor  11. Chronic deep vein thrombosis (DVT) right lower extremity unspecified vein: is stable is status post IVC filter placement will continue asa 81 mg daily  12. Chronic kidney disease stage 3 (moderate): is stable bun 35 creat 1.17 will monitor   MD is aware of resident's narcotic use and is in agreement with current plan of care. We will attempt to wean resident as apropriate   Ok Edwards NP [facility]  Contact [phone] Monday through Friday 8am- 5pm  After hours call [phone]

## 2018-04-07 ENCOUNTER — Non-Acute Institutional Stay (SKILLED_NURSING_FACILITY): Payer: Medicare Other | Admitting: Adult Health

## 2018-04-07 ENCOUNTER — Encounter: Payer: Self-pay | Admitting: Adult Health

## 2018-04-07 DIAGNOSIS — K56609 Unspecified intestinal obstruction, unspecified as to partial versus complete obstruction: Secondary | ICD-10-CM

## 2018-04-07 DIAGNOSIS — I82501 Chronic embolism and thrombosis of unspecified deep veins of right lower extremity: Secondary | ICD-10-CM | POA: Diagnosis not present

## 2018-04-07 DIAGNOSIS — Z95828 Presence of other vascular implants and grafts: Secondary | ICD-10-CM

## 2018-04-07 DIAGNOSIS — I10 Essential (primary) hypertension: Secondary | ICD-10-CM | POA: Diagnosis not present

## 2018-04-07 DIAGNOSIS — K5732 Diverticulitis of large intestine without perforation or abscess without bleeding: Secondary | ICD-10-CM | POA: Diagnosis not present

## 2018-04-07 NOTE — Progress Notes (Signed)
Location:   [facility] Room Number: 129 P Place of Service:  SNF (31)   CODE STATUS: Full Code  Allergies  Allergen Reactions  . Penicillins Rash and Other (See Comments)    Has patient had a PCN reaction causing immediate rash, facial/tongue/throat swelling, SOB or lightheadedness with hypotension: ###Yes## Has patient had a PCN reaction causing severe rash involving mucus membranes or skin necrosis: No Has patient had a PCN reaction that required hospitalization No Has patient had a PCN reaction occurring within the last 10 years: No If all of the above answers are "NO", then may proceed with Cephalosporin use.   . Vancomycin     Itching, SOB     Chief Complaint  Patient presents with  . Medical Management of Chronic Issues    Essential hypertension; chronic deep vein thrombosis (DVT) of right lower extremity unspecified vein; large bowel obstruction; diverticulitis of large intestine without perforation or abscess without bleeding.     HPI:  She is a 84 year old short term rehab patient being seen for the management of her chronic illnesses: hypertension; dvt; diverticulitis. She denies any uncontrolled pain; no changes in appetite; no anxiety or insomnia. There are no reports of fevers present. She is participating in therapy.   Past Medical History:  Diagnosis Date  . Anemia   . Anxiety    pt denies  . Arthritis    KNEES AND BACK  . Bladder incontinence   . Cancer ([place])    basal cell cancer on nose  . Edema of both feet   . Hypertension   . Pneumonia   . PONV (postoperative nausea and vomiting)    PONV X 1 EPISODE, COMES OUT OF ANESTHESIA FAST, SOME AWARENESS DURING END OF COLONSCOPY  . Scoliosis   . VRE (vancomycin resistant enterococcus) culture positive     Past Surgical History:  Procedure Laterality Date  . ABDOMINAL HYSTERECTOMY     2006 VAG HYST  . ABDOMINAL SURGERY    . APPLICATION OF WOUND VAC  03/10/2018   Procedure:  APPLICATION OF WOUND VAC;  Surgeon: [name], MD;  Location: [facility];  Service: General;;  . BACK SURGERY     LOWER, X2  . BIOPSY Right 05/02/2017   Procedure: BIOPSY OF RIGHT UPPER EYELID LESION;  Surgeon: [name], MD;  Location: [facility];  Service: Ophthalmology;  Laterality: Right;  . COLECTOMY WITH COLOSTOMY CREATION/HARTMANN PROCEDURE N/A 02/28/2018   Procedure: PARTIAL COLECTOMY WITH COLOSTOMY;  Surgeon: [name], MD;  Location: [facility];  Service: General;  Laterality: N/A;  . EYE SURGERY Bilateral    cataract surgery with lens implant  . FLEXIBLE SIGMOIDOSCOPY N/A 02/21/2018   Procedure: FLEXIBLE SIGMOIDOSCOPY;  Surgeon: [name], MD;  [facility];  Service: Endoscopy;  Laterality: N/A;  . JOINT REPLACEMENT     2011 LT HIP  . LAPAROTOMY N/A 03/10/2018   Procedure: EXPLORATORY LAPAROTOMY;  Surgeon: [name], MD;  Location: [facility];  Service: General;  Laterality: N/A;  . POLYPECTOMY  02/21/2018   Procedure: POLYPECTOMY;  Surgeon: [name], MD;  [facility];  Service: Endoscopy;;  rectum  . RECONSTRUCTION OF EYELID Right 05/02/2017   Procedure: TOTAL RECONSTRUCTION OF UPPER EYELID RIGHT EYE WITH FULL THICKNESS SKIN GRAFT FROM RIGHT POSTERIOR EAR;  Surgeon: [name], MD;  Location: [facility];  Service: Ophthalmology;  Laterality: Right;  . SECONDARY CLOSURE OF WOUND  03/10/2018   Procedure:  SECONDARY CLOSURE OF WOUND;  Surgeon: [name], MD;  Location: [facility];  Service: General;;  . TONSILLECTOMY    . TOTAL KNEE ARTHROPLASTY Right 04/27/2014   Procedure: [place] TOTAL KNEE ARTHROPLASTY;  Surgeon: Rod Can, MD;  Location: [facility];  Service: Orthopedics;  Laterality: Right;  . TUBAL LIGATION     1974  . VENA CAVA FILTER PLACEMENT N/A 06/04/2017   Procedure: INSERTION VENA-CAVA FILTER;  Surgeon: Angelia Mould, MD;  Location: [facility] OR;  Service: Vascular;  Laterality: N/A;    Social History   Socioeconomic History  .  Marital status: Married    Spouse name: Not on file  . Number of children: 3  . Years of education: 65  . Highest education level: Associate degree: occupational, Hotel manager, or vocational program  Occupational History  . Occupation: Retired    Comment: Corporate treasurer  Social Needs  . Financial resource strain: Somewhat hard  . Food insecurity:    Worry: Never true    Inability: Never true  . Transportation needs:    Medical: No    Non-medical: No  Tobacco Use  . Smoking status: Never Smoker  . Smokeless tobacco: Never Used  Substance and Sexual Activity  . Alcohol use: Yes    Comment: OCC WINE  . Drug use: No  . Sexual activity: Not on file  Lifestyle  . Physical activity:    Days per week: 0 days    Minutes per session: 0 min  . Stress: Only a little  Relationships  . Social connections:    Talks on phone: More than three times a week    Gets together: Once a week    Attends religious service: Never    Active member of club or organization: No    Attends meetings of clubs or organizations: Never    Relationship status: Married  . Intimate partner violence:    Fear of current or ex partner: No    Emotionally abused: No    Physically abused: No    Forced sexual activity: No  Other Topics Concern  . Not on file  Social History Narrative  . Not on file   Family History  Problem Relation Age of Onset  . Congestive Heart Failure Mother   . Colon cancer Father   . Cancer Brother   . Arthritis Sister   . Asthma Sister   . Diabetes Son   . Diabetes Son       VITAL SIGNS BP (!) 153/68   Pulse 71   Temp 97.9 F (36.6 C)   Resp (!) 24   Ht 5\' 6"  (1.676 m)   Wt 202 lb 3.2 oz (91.7 kg)   BMI 32.64 kg/m   Facility-Administered Encounter Medications as of 04/07/2018  Medication  . ondansetron (ZOFRAN) 4 mg in sodium chloride 0.9 % 50 mL IVPB   Outpatient Encounter Medications as of 04/07/2018  Medication Sig  . acetaminophen (TYLENOL) 325 MG tablet Take  650 mg by mouth every 6 (six) hours as needed.  Marland Kitchen amiodarone (PACERONE) 200 MG tablet Take 200 mg by mouth 2 (two) times daily.   Marland Kitchen aspirin EC 81 MG tablet Take 81 mg by mouth daily.  . Biotin 1 MG CAPS Take by mouth.  . carbamide peroxide (DEBROX) 6.5 % OTIC solution Place 5 drops into the left ear 2 (two) times daily.  . Cholecalciferol (VITAMIN D3) 5000 units TABS Take 5,000 Units by mouth daily.  . Cranberry 450 MG CAPS Take  1 capsule by mouth daily.   Marland Kitchen docusate sodium (COLACE) 100 MG capsule Take 100 mg by mouth daily.  . ferrous sulfate 325 (65 FE) MG tablet Take 325 mg by mouth daily with breakfast.  . furosemide (LASIX) 20 MG tablet Take 20 mg by mouth daily.  . Glucosamine Sulfate 500 MG TABS Take 2 tablets by mouth daily.   . montelukast (SINGULAIR) 10 MG tablet Take 10 mg by mouth every morning.  . Multiple Vitamin (MULTIVITAMIN) tablet Take 1 tablet by mouth daily.  . NON FORMULARY Diet Type:  NAS  . Nutritional Supplements (ENSURE ENLIVE PO) Take 1 Bottle by mouth 3 (three) times daily between meals.  . Nutritional Supplements (JUVEN) POWD Juven powder in packet; 7-7-1.5 gram - Give 1 packet mixed in liquid by mouth twice daily between meals  . ondansetron (ZOFRAN) 4 MG tablet Take 4 mg by mouth every 8 (eight) hours as needed for nausea or vomiting.  . pantoprazole (PROTONIX) 40 MG tablet Take 40 mg by mouth 2 (two) times daily.  . potassium chloride SA (K-DUR,KLOR-CON) 20 MEQ tablet Take 40 mEq by mouth daily.  Marland Kitchen Propylene Glycol-Glycerin (SOOTHE) 0.6-0.6 % SOLN Place 2 drops into both eyes 4 (four) times daily. And as needed  . vitamin B-12 (CYANOCOBALAMIN) 500 MCG tablet Take 500 mcg by mouth daily.  . [DISCONTINUED] ofloxacin (FLOXIN) 0.3 % OTIC solution 2 drops 2 (two) times daily. X 7 days  . [DISCONTINUED] pantoprazole (PROTONIX) 40 MG tablet Take 1 tablet (40 mg total) by mouth daily at 12 noon for 30 days. (Patient not taking: Reported on 04/07/2018)     SIGNIFICANT  DIAGNOSTIC EXAMS   PREVIOUS;   02-20-18: ct of abdomen and pelvis:  1. Large amount of stool throughout the colon to the level of the distal sigmoid colon where there is a short segment of concentric, medium density wall thickening. This is concerning for an obstructing short segment mass. It would be unusual for edema due to diverticulitis to involve that short of a segment of bowel. Correlation with sigmoidoscopy or colonoscopy is recommended. 2. Mild pericolonic soft tissue stranding involving the distal sigmoid colon, most pronounced at the level of maximal distension of the colon by the stool. This is suspicious for mild diverticulitis without abscess. There is minimal diverticulosis in that region of the colon. 3. Cholelithiasis. 4. 5 mm probable hemangioma in the spleen.  02-27-18: ct of abdomen and pelvis:  1. New small amount of free intraperitoneal air, consistent with bowel perforation. 2. Large colonic stool burden with transition point near the rectosigmoid junction, consistent with stricture. No definite soft tissue mass appreciated by CT. This is unchanged in appearance compared to previous study. Recommend correlation with colonoscopy to exclude obstructing colon carcinoma. 3. Mild diverticulitis of proximal sigmoid colon, without significant change. 4. New mild ascites and diffuse body wall edema. New small bilateral pleural effusions and bibasilar atelectasis. 5. Cholelithiasis. No radiographic evidence of cholecystitis.  03-03-18: 2-d echo:   Left Ventricle: The left ventricle has normal systolic function, with an ejection fraction of 60-65%. The cavity size was normal. There is mildly increased left ventricular wall thickness. Left ventricular diastolic parameters were normal Right Ventricle: The right ventricle has normal systolic function. The cavity was normal. There is no increase in right ventricular wall thickness.  03-04-18; chest x-ray: Low lung volumes with  cardiomegaly, vascular congestion and tiny Effusions.  NO NEW EXAMS   LABS REVIEWED PREVIOUS;   02-20-18: wbc 11.8; hgb 12.6; hct  40.4 mcv 95.7 plt 187; glucose 145; bun 23; creat 1.06 ;k+ 3.6; na++ 138; ca 9.1 liver normal albumin  02-26-18; wbc 16.1; hgb 11.2; hct 34.3; mcv 94.2; plt 221; glucose 118;bun 14; creat 0.90; k+ 4.0; na++ 134; ca 8.3  blood culture/urine culture: no growth flu Neg 02-27-18: BNP 325.0  03-01-18: wbc 25.0; hgb 10.4; hct 31.7; mcv 94.;3 plt 367; glucose 162; bun 40; creat 1.95; k+ 4.3; na++ 134; ca 7.7 mag  2.4; phos 4.0 03-03-18: wbc 17.0; hgb 8.4; hct 26.3; mcv 92.6 plt 222; glucose 148; bun 53; creat 1.53; k+ 4.0; na++ 139; ca  8.3; protein 4.3; albumin 1.8; liver normal  03-06-18: wbc 17.0; hgb 8.5; hct 26.7; mcv 93.0; plt 278; glucose 184; bun 48; creat 1.38; k+ 3.2; an++ 139; ca 8.0 phos 3.1; albumin 2.0  03-10-18: wbc 13.5; hgb 8.7; hct 27.9; mcv 96.5 plt 263; glucose 115; bun 32; creat 1.25; k+ 3.8; na++ 138; ca 8.4 total protein 4.3; albumin 2.0 liver normal  03-13-18: wbc 7.5; hgb 7.1; hct 23.2; mcv 97.9; plt 148 glucose 124; bun 43; creat 1.24; k+ 4.5; na++ 136; ca 8.6  03-17-18: glucose 115; bun 52; creat 1.08  k+ 44; na++140 ca 9.3  03-24-18: wbc 6.0; hgb 7.2; hct 24.0; mcv 96.8; plt 374; glucose 128; bun 30; creat 1.02; k+ 4.1; na++ 139; ca 9.0; liver normal albumin 2.1  04-04-18: wbc 8.2; hgb 8.8; hct 28.5; mcv 95.6 plt 299; glucose 118; bun 35; creat 1.17; k+ 4.0; na++ 140;ca 9.4 liver normal albumin 2.7  NO NEW LABS.   Review of Systems  Constitutional: Negative for malaise/fatigue.  Respiratory: Negative for cough and shortness of breath.   Cardiovascular: Negative for chest pain, palpitations and leg swelling.  Gastrointestinal: Negative for abdominal pain, constipation and heartburn.  Musculoskeletal: Negative for back pain, joint pain and myalgias.  Skin: Negative.   Neurological: Negative for dizziness.  Psychiatric/Behavioral: The patient is not  nervous/anxious.     Physical Exam Constitutional:      General: She is not in acute distress.    Appearance: She is well-developed. She is not diaphoretic.  Neck:     Musculoskeletal: Neck supple.     Thyroid: No thyromegaly.  Cardiovascular:     Rate and Rhythm: Normal rate and regular rhythm.     Pulses: Normal pulses.     Heart sounds: Normal heart sounds.     Comments: IVC filter present  Pulmonary:     Effort: Pulmonary effort is normal. No respiratory distress.     Breath sounds: Normal breath sounds.  Abdominal:     General: Bowel sounds are normal. There is no distension.     Palpations: Abdomen is soft.     Tenderness: There is no abdominal tenderness.     Comments: Colostomy   Musculoskeletal:     Right lower leg: Edema present.     Left lower leg: Edema present.     Comments:  1-2+ bilateral lower extremity edema Is able to move all extremities  History of right knee replacement 2016     Lymphadenopathy:     Cervical: No cervical adenopathy.  Skin:    General: Skin is warm and dry.     Comments: Wound vac in place   Neurological:     Mental Status: She is alert and oriented to person, place, and time.  Psychiatric:        Mood and Affect: Mood normal.       ASSESSMENT/ PLAN:  TODAY:   1. Large bowel obstruction/diverticulitis of large intestine with perforation. Is status post exploratory lap with colostomy; is stable will continue wound care; will continue to improve upon her her independence with her adls.   2. Essential hypertension: is stable b/p 153/68 will continue to monitor   3. Chronic deep vein thrombosis (DVT) of right lower extremity unspecified vein: is stable statu post IVC filter will continue asa 81 mg daily     PREVIOUS   4. Chronic non-seasonal allergic rhinitis: is stable will continue singulair 10 mg daily   5. GERD without esophagitis: is stable will continue protonix 40 mg daily   6. Severe protein calorie malnutrition: is  without change: total protein 5.6; albumin 2.7; will continue ensure three times daily   7. Venous stasis of both lower extremities: is without change will continue lasix 40 mg daily with k+ 40 meq daily   8. Chronic kidney disease stage 3 (moderate): is stable bun 35 creat 1.17 will monitor   9. Wound dehiscence surgical; sequela: is stable wound is healing wound vac in place will monitor  10. New onset atrial fibrillation: heart rate is stable will continue amiodarone 200 mg twice daily for rate control and asa 81 mg daily   11. Acute blood loss anemia: is stable hgb 8,8 will continue iron daily   12. Chronic constipation: is stable will continue  colace daily and will monitor       MD is aware of resident's narcotic use and is in agreement with current plan of care. We will attempt to wean resident as apropriate   Ok Edwards NP [facility]  Contact [phone] Monday through Friday 8am- 5pm  After hours call [phone]

## 2018-04-11 DIAGNOSIS — Z95828 Presence of other vascular implants and grafts: Secondary | ICD-10-CM | POA: Insufficient documentation

## 2018-04-17 ENCOUNTER — Non-Acute Institutional Stay (SKILLED_NURSING_FACILITY): Payer: Medicare Other | Admitting: Internal Medicine

## 2018-04-17 ENCOUNTER — Encounter: Payer: Self-pay | Admitting: Internal Medicine

## 2018-04-17 DIAGNOSIS — K219 Gastro-esophageal reflux disease without esophagitis: Secondary | ICD-10-CM

## 2018-04-17 DIAGNOSIS — I4891 Unspecified atrial fibrillation: Secondary | ICD-10-CM

## 2018-04-17 DIAGNOSIS — D62 Acute posthemorrhagic anemia: Secondary | ICD-10-CM

## 2018-04-17 DIAGNOSIS — I10 Essential (primary) hypertension: Secondary | ICD-10-CM

## 2018-04-17 NOTE — Progress Notes (Signed)
Location:  [facility] Room Number: 129 P Place of Service:  SNF [phone])  Provider: Veleta Miners, MD  PCP: [name], DO Patient Care Team: [name], DO as PCP - General (Family Medicine) Angelia Mould, MD as Consulting Physician (Vascular Surgery)  Extended Emergency Contact Information Primary Emergency Contact: Thomas,Virgil Address: [address] [place] [address] Johnnette Litter of [place] Phone: [phone] Mobile Phone: [phone] Relation: Spouse Secondary Emergency Contact: Shaver,Timothy          [place], [place] of [place] Phone: [phone] Relation: Son  Code Status:   Full Code Goals of care:  Advanced Directive information Advanced Directives 04/17/2018  Does Patient Have a Medical Advance Directive? No  Type of Advance Directive -  Does patient want to make changes to medical advance directive? No - Patient declined  Copy of [place] in Chart? -  Would patient like information on creating a medical advance directive? No - Patient declined     Allergies  Allergen Reactions  . Penicillins Rash and Other (See Comments)    Has patient had a PCN reaction causing immediate rash, facial/tongue/throat swelling, SOB or lightheadedness with hypotension: ###Yes## Has patient had a PCN reaction causing severe rash involving mucus membranes or skin necrosis: No Has patient had a PCN reaction that required hospitalization No Has patient had a PCN reaction occurring within the last 10 years: No If all of the above answers are "NO", then may proceed with Cephalosporin use.   . Vancomycin     Itching, SOB     Chief Complaint  Patient presents with  . Discharge Note    Discharging to home on 04/18/2018    HPI:  84 y.o. female seen today for discharge from the facility.  Patient was in the hospital from 02/06-02/27 for bowel Obstruction, s/p Hartmans procedure And  Wound Dehiscence  Patient has h/o Hypertension, DVT s/P IVC filter, Chronic back Pain, LE swelling with Left Foot Venous Ulcer,  She came to ED for Abdominal pain and Constipation. She was found to have Colonic Obstruction on CT scan. She underwent sigmoidoscopy which showedobstruction in the sigmoid colon with market edema. Repeat CT scan showed free air. She underwent Hartman's procedure on 2/14.for Perforation and Fecal peritonitis.She a developed hypotension and new onset A. fib after her surgery. She converted to NSR on amiodarone. She also needed Pressors and Vent support. Latershe developed wound dehiscence and was taken to the OR again for reclosure of her abdominal wound. 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 She was also treatedwith Ertapenem ,linezolid and fluconazole.  Patient did well in facility she is walking with therapy and is now getting more independent with her transfers.  She does continue to have multiple complaints and she is upset that she is not able to stay in the facility for longer time Patient's wound has healed well and is she is off her VAC and has  small opening left which is healing itself.  Her bowels are moving well through colostomy bag.  She is eating well no fever no cough no shortness of breath she is off her oxygen. Patient is going home with her husband.  They have a son who is going to help her also.  Past Medical History:  Diagnosis Date  . Anemia   . Anxiety    pt denies  .  Arthritis    KNEES AND BACK  . Bladder incontinence   . Cancer ([place])    basal cell cancer on nose  . Edema of both feet   . Hypertension   . Pneumonia   . PONV (postoperative nausea and vomiting)    PONV X 1 EPISODE, COMES OUT OF ANESTHESIA FAST, SOME AWARENESS DURING END OF COLONSCOPY  . Scoliosis   . VRE (vancomycin resistant enterococcus) culture positive     Past Surgical History:  Procedure Laterality Date  . ABDOMINAL  HYSTERECTOMY     2006 VAG HYST  . ABDOMINAL SURGERY    . APPLICATION OF WOUND VAC  03/10/2018   Procedure: APPLICATION OF WOUND VAC;  Surgeon: [name], MD;  [facility];  Service: General;;  . BACK SURGERY     LOWER, X2  . BIOPSY Right 05/02/2017   Procedure: BIOPSY OF RIGHT UPPER EYELID LESION;  Surgeon: [name], MD;  Location: [facility];  Service: Ophthalmology;  Laterality: Right;  . COLECTOMY WITH COLOSTOMY CREATION/HARTMANN PROCEDURE N/A 02/28/2018   Procedure: PARTIAL COLECTOMY WITH COLOSTOMY;  Surgeon: [name], MD;  [facility];  Service: General;  Laterality: N/A;  . EYE SURGERY Bilateral    cataract surgery with lens implant  . FLEXIBLE SIGMOIDOSCOPY N/A 02/21/2018   Procedure: FLEXIBLE SIGMOIDOSCOPY;  Surgeon: [name], MD;  [facility];  Service: Endoscopy;  Laterality: N/A;  . JOINT REPLACEMENT     2011 LT HIP  . LAPAROTOMY N/A 03/10/2018   Procedure: EXPLORATORY LAPAROTOMY;  Surgeon: [name], MD;  [facility];  Service: General;  Laterality: N/A;  . POLYPECTOMY  02/21/2018   Procedure: POLYPECTOMY;  Surgeon: [name], MD;  [facility];  Service: Endoscopy;;  rectum  . RECONSTRUCTION OF EYELID Right 05/02/2017   Procedure: TOTAL RECONSTRUCTION OF UPPER EYELID RIGHT EYE WITH FULL THICKNESS SKIN GRAFT FROM RIGHT POSTERIOR EAR;  Surgeon: [name], MD;  Location: [facility];  Service: Ophthalmology;  Laterality: Right;  . SECONDARY CLOSURE OF WOUND  03/10/2018   Procedure: SECONDARY CLOSURE OF WOUND;  Surgeon: [name], MD;  [facility];  Service: General;;  . TONSILLECTOMY    . TOTAL KNEE ARTHROPLASTY Right 04/27/2014   Procedure: [place] TOTAL KNEE ARTHROPLASTY;  Surgeon: Rod Can, MD;  [facility];  Service: Orthopedics;  Laterality: Right;  . TUBAL LIGATION     1974  . VENA CAVA FILTER PLACEMENT N/A 06/04/2017   Procedure: INSERTION VENA-CAVA FILTER;  Surgeon: Angelia Mould,  MD;  Location: [facility];  Service: Vascular;  Laterality: N/A;      reports that she has never smoked. She has never used smokeless tobacco. She reports current alcohol use. She reports that she does not use drugs. Social History   Socioeconomic History  . Marital status: Married    Spouse name: Not on file  . Number of children: 3  . Years of education: 110  . Highest education level: Associate degree: occupational, Hotel manager, or vocational program  Occupational History  . Occupation: Retired    Comment: Corporate treasurer  Social Needs  . Financial resource strain: Somewhat hard  . Food insecurity:    Worry: Never true    Inability: Never true  . Transportation needs:    Medical: No    Non-medical: No  Tobacco Use  . Smoking status: Never Smoker  . Smokeless tobacco: Never Used  Substance and Sexual Activity  . Alcohol use: Yes  Comment: OCC WINE  . Drug use: No  . Sexual activity: Not on file  Lifestyle  . Physical activity:    Days per week: 0 days    Minutes per session: 0 min  . Stress: Only a little  Relationships  . Social connections:    Talks on phone: More than three times a week    Gets together: Once a week    Attends religious service: Never    Active member of club or organization: No    Attends meetings of clubs or organizations: Never    Relationship status: Married  . Intimate partner violence:    Fear of current or ex partner: No    Emotionally abused: No    Physically abused: No    Forced sexual activity: No  Other Topics Concern  . Not on file  Social History Narrative  . Not on file   Functional Status Survey:    Allergies  Allergen Reactions  . Penicillins Rash and Other (See Comments)    Has patient had a PCN reaction causing immediate rash, facial/tongue/throat swelling, SOB or lightheadedness with hypotension: ###Yes## Has patient had a PCN reaction causing severe rash involving mucus membranes or skin necrosis: No Has patient had a  PCN reaction that required hospitalization No Has patient had a PCN reaction occurring within the last 10 years: No If all of the above answers are "NO", then may proceed with Cephalosporin use.   . Vancomycin     Itching, SOB     Pertinent  Health Maintenance Due  Topic Date Due  . DEXA SCAN  12/20/2018 (Originally 12/25/2001)  . INFLUENZA VACCINE  08/16/2018  . PNA vac Low Risk Adult  Completed    Medications: Facility-Administered Encounter Medications as of 04/17/2018  Medication  . ondansetron (ZOFRAN) 4 mg in sodium chloride 0.9 % 50 mL IVPB   Outpatient Encounter Medications as of 04/17/2018  Medication Sig  . acetaminophen (TYLENOL) 325 MG tablet Take 650 mg by mouth every 6 (six) hours as needed.  Marland Kitchen amiodarone (PACERONE) 200 MG tablet Take 200 mg by mouth 2 (two) times daily.   Marland Kitchen aspirin EC 81 MG tablet Take 81 mg by mouth daily.  . Biotin 1 MG CAPS Take by mouth.  . Cholecalciferol (VITAMIN D3) 5000 units TABS Take 5,000 Units by mouth daily.  . Cranberry 450 MG CAPS Take 1 capsule by mouth daily.   Marland Kitchen docusate sodium (COLACE) 100 MG capsule Take 100 mg by mouth daily.  . ferrous sulfate 325 (65 FE) MG tablet Take 325 mg by mouth daily with breakfast.  . furosemide (LASIX) 20 MG tablet Take 20 mg by mouth daily.  . Glucosamine Sulfate 500 MG TABS Take 2 tablets by mouth daily.   . magnesium hydroxide (MILK OF MAGNESIA) 400 MG/5ML suspension Take 30 mLs by mouth daily as needed for mild constipation.  . montelukast (SINGULAIR) 10 MG tablet Take 10 mg by mouth every morning.  . Multiple Vitamin (MULTIVITAMIN) tablet Take 1 tablet by mouth daily.  . NON FORMULARY Diet Type:  NAS  . Nutritional Supplements (ENSURE ENLIVE PO) Take 1 Bottle by mouth 3 (three) times daily between meals.  . Nutritional Supplements (JUVEN) POWD Juven powder in packet; 7-7-1.5 gram - Give 1 packet mixed in liquid by mouth twice daily between meals  . ondansetron (ZOFRAN) 4 MG tablet Take 4 mg by  mouth every 8 (eight) hours as needed for nausea or vomiting.  . pantoprazole (PROTONIX) 40 MG  tablet Take 40 mg by mouth 2 (two) times daily.  . potassium chloride SA (K-DUR,KLOR-CON) 20 MEQ tablet Take 40 mEq by mouth daily.  Marland Kitchen Propylene Glycol-Glycerin (SOOTHE) 0.6-0.6 % SOLN Place 2 drops into both eyes 4 (four) times daily. And as needed  . psyllium (REGULOID) 0.52 g capsule Take 0.52 g by mouth daily.  . vitamin B-12 (CYANOCOBALAMIN) 500 MCG tablet Take 500 mcg by mouth daily.    Review of Systems  Vitals:   04/17/18 0953  BP: 128/76  Pulse: 72  Resp: 18  Temp: (!) 97.5 F (36.4 C)  Weight: 193 lb 12.8 oz (87.9 kg)  Height: 5\' 6"  (1.676 m)   Body mass index is 31.28 kg/m. Physical Exam Vitals signs reviewed.  Constitutional:      Appearance: Normal appearance.  HENT:     Head: Normocephalic.     Nose: Nose normal.     Mouth/Throat:     Mouth: Mucous membranes are moist.     Pharynx: Oropharynx is clear.  Eyes:     Pupils: Pupils are equal, round, and reactive to light.  Neck:     Musculoskeletal: Neck supple.  Cardiovascular:     Rate and Rhythm: Normal rate and regular rhythm.     Pulses: Normal pulses.     Heart sounds: Normal heart sounds.  Pulmonary:     Effort: Pulmonary effort is normal. No respiratory distress.     Breath sounds: Normal breath sounds. No wheezing or rales.  Abdominal:     General: Abdomen is flat.     Palpations: Abdomen is soft.  Musculoskeletal:        General: No swelling.  Skin:    General: Skin is warm.  Neurological:     General: No focal deficit present.     Mental Status: She is alert and oriented to person, place, and time.  Psychiatric:        Mood and Affect: Mood normal.        Thought Content: Thought content normal.     Labs reviewed: Basic Metabolic Panel: Recent Labs    02/28/18 0601 03/01/18 0419  03/07/18 0526 03/08/18 1610 03/09/18 0610  03/17/18 0631 03/24/18 0634 04/04/18 0708  NA 133* 134*   < >  141 140 139   < > 140 139 140  K 4.5 4.3   < > 3.5 3.5 3.5   < > 4.4 4.1 4.0  CL 96* 105   < > 103 99 99   < > 103 106 103  CO2 24 21*   < > 31 31 32   < > 31 26 27   GLUCOSE 100* 162*   < > 120* 131* 120*   < > 115* 128* 118*  BUN 31* 40*   < > 43* 37* 34*   < > 52* 30* 35*  CREATININE 2.23* 1.95*   < > 1.31* 1.21* 1.24*   < > 1.08* 1.02* 1.17*  CALCIUM 8.8* 7.7*   < > 8.2* 8.3* 8.3*   < > 9.3 9.0 9.4  MG 2.5* 2.4  --  1.8  --   --   --   --   --   --   PHOS  --  4.0   < > 3.3 3.1 3.2  --   --   --   --    < > = values in this interval not displayed.   Liver Function Tests: Recent Labs    03/10/18 0434 03/24/18  6063 04/04/18 0708  AST 15 13* 18  ALT 9 11 17   ALKPHOS 41 66 72  BILITOT 0.7 0.3 0.3  PROT 4.3* 4.9* 5.6*  ALBUMIN 2.0* 2.1* 2.7*   No results for input(s): LIPASE, AMYLASE in the last 8760 hours. No results for input(s): AMMONIA in the last 8760 hours. CBC: Recent Labs    06/04/17 0151  12/09/17 1041 01/28/18 1238  03/13/18 0420 03/24/18 0634 04/04/18 0708  WBC 7.8   < > 6.7 7.1   < > 7.5 6.0 8.2  NEUTROABS 4.6  --  3.8 4.5  --   --   --   --   HGB 8.6*   < > 13.4 13.8   < > 7.1* 7.2* 8.8*  HCT 26.6*   < > 39.5 40.8   < > 23.2* 24.0* 28.5*  MCV 94.0   < > 91 91   < > 97.9 96.8 95.6  PLT 137*   < > 214 173   < > 148* 374 299   < > = values in this interval not displayed.   Cardiac Enzymes: No results for input(s): CKTOTAL, CKMB, CKMBINDEX, TROPONINI in the last 8760 hours. BNP: Invalid input(s): POCBNP CBG: Recent Labs    03/13/18 0418 03/13/18 0820 03/13/18 1132  GLUCAP 112* 106* 119*    Procedures and Imaging Studies During Stay: No results found.  Assessment/Plan:   S/P hartmann procedure with Colostomy bag  Wound has healed well. Would be followed by Home Nurse for NS dressing Would follow with Dr Arnoldo Morale  New Onset Atrial fibrillation Patient is on amiodarone In sinus rhythm EF of 60% with LAE Per cardiology not on any  anticoagulation Patient has appointment to follow-up with them CKD BUN has improved Creatinine near her baseline  Bilateral Edema Edema improved Patient has lost almost 20 pounds since she has been here but it seems more like due to fluid Anemia Hemoglobin is low but stable We will continue her on iron Will need follow up with her PCP and surgeon Discharge Disposition Lives with her husband was not very active before either due to chronic pain She will be discharged home with her husband  And Home Therapy to continue    Patient is being discharged with the following home health services: [facility]  Patient is being discharged with the following durable medical equipment:  Approved for Wheelchair  Patient has been advised to f/u with their PCP in 1-2 weeks to for a transitions of care visit.  Social services at their facility was responsible for arranging this appointment.  Pt was provided with adequate prescriptions of noncontrolled medications to reach the scheduled appointment .  For controlled substances, a limited supply was provided as appropriate for the individual patient.  If the pt normally receives these medications from a pain clinic or has a contract with another physician, these medications should be received from that clinic or physician only).    Future labs/tests needed:   Discharge planning for more then 35 min

## 2018-04-17 NOTE — Progress Notes (Deleted)
Location:  [facility] Room Number: 129 P Place of Service:  SNF [phone]) Provider:  Veleta Miners, MD  [name], DO  Patient Care Team: [name], DO as PCP - General (Family Medicine) Angelia Mould, MD as Consulting Physician (Vascular Surgery)  Extended Emergency Contact Information Primary Emergency Contact: Thomas,Virgil Address: [address] [place] [address] Johnnette Litter of [place] Phone: [phone] Mobile Phone: [phone] Relation: Spouse Secondary Emergency Contact: Shaver,Timothy          [place], [place] of [place] Phone: [phone] Relation: Son  Code Status:  Full Code Goals of care: Advanced Directive information Advanced Directives 04/17/2018  Does Patient Have a Medical Advance Directive? No  Type of Advance Directive -  Does patient want to make changes to medical advance directive? No - Patient declined  Copy of [place] in Chart? -  Would patient like information on creating a medical advance directive? No - Patient declined     Chief Complaint  Patient presents with  . Discharge Note    Discharging to home    HPI:  Pt is a 84 y.o. female seen today for medical management of chronic diseases.     Past Medical History:  Diagnosis Date  . Anemia   . Anxiety    pt denies  . Arthritis    KNEES AND BACK  . Bladder incontinence   . Cancer ([place])    basal cell cancer on nose  . Edema of both feet   . Hypertension   . Pneumonia   . PONV (postoperative nausea and vomiting)    PONV X 1 EPISODE, COMES OUT OF ANESTHESIA FAST, SOME AWARENESS DURING END OF COLONSCOPY  . Scoliosis   . VRE (vancomycin resistant enterococcus) culture positive    Past Surgical History:  Procedure Laterality Date  . ABDOMINAL HYSTERECTOMY     2006 VAG HYST  . ABDOMINAL SURGERY    . APPLICATION OF WOUND VAC  03/10/2018   Procedure: APPLICATION OF WOUND VAC;  Surgeon:  [name], MD;  [facility];  Service: General;;  . BACK SURGERY     LOWER, X2  . BIOPSY Right 05/02/2017   Procedure: BIOPSY OF RIGHT UPPER EYELID LESION;  Surgeon: [name], MD;  Location: [facility];  Service: Ophthalmology;  Laterality: Right;  . COLECTOMY WITH COLOSTOMY CREATION/HARTMANN PROCEDURE N/A 02/28/2018   Procedure: PARTIAL COLECTOMY WITH COLOSTOMY;  Surgeon: [name], MD;  [facility];  Service: General;  Laterality: N/A;  . EYE SURGERY Bilateral    cataract surgery with lens implant  . FLEXIBLE SIGMOIDOSCOPY N/A 02/21/2018   Procedure: FLEXIBLE SIGMOIDOSCOPY;  Surgeon: [name], MD;  [facility];  Service: Endoscopy;  Laterality: N/A;  . JOINT REPLACEMENT     2011 LT HIP  . LAPAROTOMY N/A 03/10/2018   Procedure: EXPLORATORY LAPAROTOMY;  Surgeon: [name], MD;  [facility];  Service: General;  Laterality: N/A;  . POLYPECTOMY  02/21/2018   Procedure: POLYPECTOMY;  Surgeon: [name], MD;  [facility];  Service: Endoscopy;;  rectum  . RECONSTRUCTION OF EYELID Right 05/02/2017   Procedure: TOTAL RECONSTRUCTION OF UPPER EYELID RIGHT EYE WITH FULL THICKNESS SKIN GRAFT FROM RIGHT POSTERIOR EAR;  Surgeon: [name], MD;  Location: [facility];  Service: Ophthalmology;  Laterality: Right;  . SECONDARY CLOSURE OF WOUND  03/10/2018  Procedure: SECONDARY CLOSURE OF WOUND;  Surgeon: [name], MD;  [facility];  Service: General;;  . TONSILLECTOMY    . TOTAL KNEE ARTHROPLASTY Right 04/27/2014   Procedure: [place] TOTAL KNEE ARTHROPLASTY;  Surgeon: Rod Can, MD;  [facility];  Service: Orthopedics;  Laterality: Right;  . TUBAL LIGATION     1974  . VENA CAVA FILTER PLACEMENT N/A 06/04/2017   Procedure: INSERTION VENA-CAVA FILTER;  Surgeon: Angelia Mould, MD;  [facility];  Service: Vascular;  Laterality: N/A;    Allergies  Allergen Reactions  . Penicillins Rash and Other (See Comments)     Has patient had a PCN reaction causing immediate rash, facial/tongue/throat swelling, SOB or lightheadedness with hypotension: ###Yes## Has patient had a PCN reaction causing severe rash involving mucus membranes or skin necrosis: No Has patient had a PCN reaction that required hospitalization No Has patient had a PCN reaction occurring within the last 10 years: No If all of the above answers are "NO", then may proceed with Cephalosporin use.   . Vancomycin     Itching, SOB     Facility-Administered Encounter Medications as of 04/17/2018  Medication  . ondansetron (ZOFRAN) 4 mg in sodium chloride 0.9 % 50 mL IVPB   Outpatient Encounter Medications as of 04/17/2018  Medication Sig  . acetaminophen (TYLENOL) 325 MG tablet Take 650 mg by mouth every 6 (six) hours as needed.  Marland Kitchen amiodarone (PACERONE) 200 MG tablet Take 200 mg by mouth 2 (two) times daily.   Marland Kitchen aspirin EC 81 MG tablet Take 81 mg by mouth daily.  . Biotin 1 MG CAPS Take by mouth.  . Cholecalciferol (VITAMIN D3) 5000 units TABS Take 5,000 Units by mouth daily.  . Cranberry 450 MG CAPS Take 1 capsule by mouth daily.   Marland Kitchen docusate sodium (COLACE) 100 MG capsule Take 100 mg by mouth daily.  . ferrous sulfate 325 (65 FE) MG tablet Take 325 mg by mouth daily with breakfast.  . furosemide (LASIX) 20 MG tablet Take 20 mg by mouth daily.  . Glucosamine Sulfate 500 MG TABS Take 2 tablets by mouth daily.   . magnesium hydroxide (MILK OF MAGNESIA) 400 MG/5ML suspension Take 30 mLs by mouth daily as needed for mild constipation.  . montelukast (SINGULAIR) 10 MG tablet Take 10 mg by mouth every morning.  . Multiple Vitamin (MULTIVITAMIN) tablet Take 1 tablet by mouth daily.  . NON FORMULARY Diet Type:  NAS  . Nutritional Supplements (ENSURE ENLIVE PO) Take 1 Bottle by mouth 3 (three) times daily between meals.  . Nutritional Supplements (JUVEN) POWD Juven powder in packet; 7-7-1.5 gram - Give 1 packet mixed in liquid by mouth twice daily between  meals  . ondansetron (ZOFRAN) 4 MG tablet Take 4 mg by mouth every 8 (eight) hours as needed for nausea or vomiting.  . pantoprazole (PROTONIX) 40 MG tablet Take 40 mg by mouth 2 (two) times daily.  . potassium chloride SA (K-DUR,KLOR-CON) 20 MEQ tablet Take 40 mEq by mouth daily.  Marland Kitchen Propylene Glycol-Glycerin (SOOTHE) 0.6-0.6 % SOLN Place 2 drops into both eyes 4 (four) times daily. And as needed  . psyllium (REGULOID) 0.52 g capsule Take 0.52 g by mouth daily.  . vitamin B-12 (CYANOCOBALAMIN) 500 MCG tablet Take 500 mcg by mouth daily.    Review of Systems  Immunization History  Administered Date(s) Administered  . Influenza-Unspecified 09/24/2007, 10/11/2009, 10/03/2010, 12/02/2011, 11/03/2013  . Pneumococcal Conjugate-13 11/03/2013  . Pneumococcal Polysaccharide-23 11/05/2002  Pertinent  Health Maintenance Due  Topic Date Due  . DEXA SCAN  12/20/2018 (Originally 12/25/2001)  . INFLUENZA VACCINE  08/16/2018  . PNA vac Low Risk Adult  Completed   Fall Risk  02/20/2018 01/28/2018 12/19/2017 12/09/2017  Falls in the past year? 1 1 1  0  Number falls in past yr: 1 1 1  -  Injury with Fall? 1 0 0 -  Risk for fall due to : - - Impaired balance/gait;Impaired mobility -  Follow up - - Education provided;Falls prevention discussed -   Functional Status Survey:    Vitals:   04/17/18 0953  BP: 128/76  Pulse: 72  Resp: 18  Temp: (!) 97.5 F (36.4 C)  Weight: 193 lb 12.8 oz (87.9 kg)  Height: 5\' 6"  (1.676 m)   Body mass index is 31.28 kg/m. Physical Exam  Labs reviewed: Recent Labs    02/28/18 0601 03/01/18 0419  03/07/18 0526 03/08/18 5035 03/09/18 0610  03/17/18 0631 03/24/18 0634 04/04/18 0708  NA 133* 134*   < > 141 140 139   < > 140 139 140  K 4.5 4.3   < > 3.5 3.5 3.5   < > 4.4 4.1 4.0  CL 96* 105   < > 103 99 99   < > 103 106 103  CO2 24 21*   < > 31 31 32   < > 31 26 27   GLUCOSE 100* 162*   < > 120* 131* 120*   < > 115* 128* 118*  BUN 31* 40*   < > 43* 37* 34*    < > 52* 30* 35*  CREATININE 2.23* 1.95*   < > 1.31* 1.21* 1.24*   < > 1.08* 1.02* 1.17*  CALCIUM 8.8* 7.7*   < > 8.2* 8.3* 8.3*   < > 9.3 9.0 9.4  MG 2.5* 2.4  --  1.8  --   --   --   --   --   --   PHOS  --  4.0   < > 3.3 3.1 3.2  --   --   --   --    < > = values in this interval not displayed.   Recent Labs    03/10/18 0434 03/24/18 0634 04/04/18 0708  AST 15 13* 18  ALT 9 11 17   ALKPHOS 41 66 72  BILITOT 0.7 0.3 0.3  PROT 4.3* 4.9* 5.6*  ALBUMIN 2.0* 2.1* 2.7*   Recent Labs    06/04/17 0151  12/09/17 1041 01/28/18 1238  03/13/18 0420 03/24/18 0634 04/04/18 0708  WBC 7.8   < > 6.7 7.1   < > 7.5 6.0 8.2  NEUTROABS 4.6  --  3.8 4.5  --   --   --   --   HGB 8.6*   < > 13.4 13.8   < > 7.1* 7.2* 8.8*  HCT 26.6*   < > 39.5 40.8   < > 23.2* 24.0* 28.5*  MCV 94.0   < > 91 91   < > 97.9 96.8 95.6  PLT 137*   < > 214 173   < > 148* 374 299   < > = values in this interval not displayed.   No results found for: TSH No results found for: HGBA1C No results found for: CHOL, HDL, LDLCALC, LDLDIRECT, TRIG, CHOLHDL  Significant Diagnostic Results in last 30 days:  No results found.  Assessment/Plan There are no diagnoses linked to this encounter.   Family/  staff Communication: ***  Labs/tests ordered:  ***

## 2018-04-18 ENCOUNTER — Encounter (HOSPITAL_COMMUNITY)
Admission: RE | Admit: 2018-04-18 | Discharge: 2018-04-18 | Disposition: A | Discharge status: Home / Self Care | Payer: Medicare Other | Source: Skilled Nursing Facility | Attending: Internal Medicine | Admitting: Internal Medicine

## 2018-04-18 DIAGNOSIS — N183 Chronic kidney disease, stage 3 (moderate): Secondary | ICD-10-CM | POA: Insufficient documentation

## 2018-04-18 DIAGNOSIS — M19012 Primary osteoarthritis, left shoulder: Secondary | ICD-10-CM | POA: Insufficient documentation

## 2018-04-18 DIAGNOSIS — T8132XD Disruption of internal operation (surgical) wound, not elsewhere classified, subsequent encounter: Secondary | ICD-10-CM | POA: Insufficient documentation

## 2018-04-18 LAB — CBC
HCT: 30.4 % — ABNORMAL LOW (ref 36.0–46.0)
Hemoglobin: 9.3 g/dL — ABNORMAL LOW (ref 12.0–15.0)
MCH: 29.7 pg (ref 26.0–34.0)
MCHC: 30.6 g/dL (ref 30.0–36.0)
MCV: 97.1 fL (ref 80.0–100.0)
Platelets: 223 10*3/uL (ref 150–400)
RBC: 3.13 MIL/uL — ABNORMAL LOW (ref 3.87–5.11)
RDW: 15.9 % — ABNORMAL HIGH (ref 11.5–15.5)
WBC: 8.8 10*3/uL (ref 4.0–10.5)
nRBC: 0 % (ref 0.0–0.2)

## 2018-04-19 ENCOUNTER — Encounter: Payer: Self-pay | Admitting: Family Medicine

## 2018-04-22 ENCOUNTER — Ambulatory Visit: Payer: Medicare Other | Admitting: Family Medicine

## 2018-04-24 ENCOUNTER — Emergency Department (HOSPITAL_COMMUNITY)
Admission: EM | Admit: 2018-04-24 | Discharge: 2018-04-24 | Disposition: A | Discharge status: Home / Self Care | Payer: Medicare Other | Attending: Emergency Medicine | Admitting: Emergency Medicine

## 2018-04-24 ENCOUNTER — Emergency Department (HOSPITAL_COMMUNITY): Payer: Medicare Other

## 2018-04-24 ENCOUNTER — Encounter (HOSPITAL_COMMUNITY): Payer: Self-pay | Admitting: Emergency Medicine

## 2018-04-24 ENCOUNTER — Other Ambulatory Visit: Payer: Self-pay

## 2018-04-24 DIAGNOSIS — Z933 Colostomy status: Secondary | ICD-10-CM | POA: Insufficient documentation

## 2018-04-24 DIAGNOSIS — R1084 Generalized abdominal pain: Secondary | ICD-10-CM | POA: Diagnosis not present

## 2018-04-24 DIAGNOSIS — Z8542 Personal history of malignant neoplasm of other parts of uterus: Secondary | ICD-10-CM | POA: Insufficient documentation

## 2018-04-24 DIAGNOSIS — K5900 Constipation, unspecified: Secondary | ICD-10-CM | POA: Insufficient documentation

## 2018-04-24 DIAGNOSIS — R110 Nausea: Secondary | ICD-10-CM | POA: Insufficient documentation

## 2018-04-24 DIAGNOSIS — K9403 Colostomy malfunction: Secondary | ICD-10-CM | POA: Diagnosis present

## 2018-04-24 LAB — CBC WITH DIFFERENTIAL/PLATELET
Abs Immature Granulocytes: 0.02 10*3/uL (ref 0.00–0.07)
Basophils Absolute: 0 10*3/uL (ref 0.0–0.1)
Basophils Relative: 0 %
Eosinophils Absolute: 0.2 10*3/uL (ref 0.0–0.5)
Eosinophils Relative: 2 %
HCT: 35.2 % — ABNORMAL LOW (ref 36.0–46.0)
Hemoglobin: 11 g/dL — ABNORMAL LOW (ref 12.0–15.0)
Immature Granulocytes: 0 %
Lymphocytes Relative: 22 %
Lymphs Abs: 2.1 10*3/uL (ref 0.7–4.0)
MCH: 29.6 pg (ref 26.0–34.0)
MCHC: 31.3 g/dL (ref 30.0–36.0)
MCV: 94.6 fL (ref 80.0–100.0)
Monocytes Absolute: 0.9 10*3/uL (ref 0.1–1.0)
Monocytes Relative: 9 %
Neutro Abs: 6.5 10*3/uL (ref 1.7–7.7)
Neutrophils Relative %: 67 %
Platelets: 287 10*3/uL (ref 150–400)
RBC: 3.72 MIL/uL — ABNORMAL LOW (ref 3.87–5.11)
RDW: 15.6 % — ABNORMAL HIGH (ref 11.5–15.5)
WBC: 9.8 10*3/uL (ref 4.0–10.5)
nRBC: 0 % (ref 0.0–0.2)

## 2018-04-24 LAB — COMPREHENSIVE METABOLIC PANEL
ALT: 21 U/L (ref 0–44)
AST: 16 U/L (ref 15–41)
Albumin: 3.4 g/dL — ABNORMAL LOW (ref 3.5–5.0)
Alkaline Phosphatase: 68 U/L (ref 38–126)
Anion gap: 10 (ref 5–15)
BUN: 25 mg/dL — ABNORMAL HIGH (ref 8–23)
CO2: 27 mmol/L (ref 22–32)
Calcium: 9.4 mg/dL (ref 8.9–10.3)
Chloride: 102 mmol/L (ref 98–111)
Creatinine, Ser: 1.53 mg/dL — ABNORMAL HIGH (ref 0.44–1.00)
GFR calc Af Amer: 37 mL/min — ABNORMAL LOW (ref >60)
GFR calc non Af Amer: 32 mL/min — ABNORMAL LOW (ref >60)
Glucose, Bld: 112 mg/dL — ABNORMAL HIGH (ref 70–99)
Potassium: 3.4 mmol/L — ABNORMAL LOW (ref 3.5–5.1)
Sodium: 139 mmol/L (ref 135–145)
Total Bilirubin: 0.5 mg/dL (ref 0.3–1.2)
Total Protein: 6.8 g/dL (ref 6.5–8.1)

## 2018-04-24 LAB — LIPASE, BLOOD: Lipase: 28 U/L (ref 11–51)

## 2018-04-24 MED ORDER — SODIUM CHLORIDE 0.9 % IV BOLUS
500.0000 mL | Freq: Once | INTRAVENOUS | Status: AC
  Administered 2018-04-24: 500 mL via INTRAVENOUS

## 2018-04-24 MED ORDER — SENNOSIDES-DOCUSATE SODIUM 8.6-50 MG PO TABS: 1.0000 | ORAL_TABLET | Freq: Every evening | ORAL | 0 refills | Status: DC | PRN

## 2018-04-24 MED ORDER — IOHEXOL 300 MG/ML  SOLN
75.0000 mL | Freq: Once | INTRAMUSCULAR | Status: AC | PRN
  Administered 2018-04-24: 22:00:00 75 mL via INTRAVENOUS

## 2018-04-24 NOTE — ED Notes (Signed)
Called family to get patient

## 2018-04-24 NOTE — Discharge Instructions (Signed)

## 2018-04-24 NOTE — ED Notes (Signed)
edp in room with rn to assess stoma

## 2018-04-24 NOTE — ED Provider Notes (Signed)
Emergency Department Provider Note   I have reviewed the triage vital signs and the nursing notes.   HISTORY  Chief Complaint stoma problems   HPI Regina Hall is a 84 y.o. 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  Patient states she was evaluated today by the home health aide/nurse.  They changed her dressing and stoma bag.  Patient states the aide noticed that the stoma appeared "inverted" and referred her to the ED. Patient denies any fever or abdominal pain currently. She denies any vomiting but has had severe nausea today. No CP or SOB. No cough.    Past Medical History:  Diagnosis Date  . Anemia   . Anxiety    pt denies  . Arthritis    KNEES AND BACK  . Bladder incontinence   . Cancer ([place])    basal cell cancer on nose  . Edema of both feet   . Hypertension   . Pneumonia   . PONV (postoperative nausea and vomiting)    PONV X 1 EPISODE, COMES OUT OF ANESTHESIA FAST, SOME AWARENESS DURING END OF COLONSCOPY  . Scoliosis   . VRE (vancomycin resistant enterococcus) culture positive     Patient Active Problem List   Diagnosis Date Noted  . Presence of IVC filter 04/11/2018  . Chronic kidney disease, stage 3 (moderate) ([employer]) 03/14/2018  . New onset atrial fibrillation ([place]) 03/14/2018  . Chronic non-seasonal allergic rhinitis 03/14/2018  . GERD without esophagitis 03/14/2018  . Severe protein-calorie malnutrition ([place]) 03/14/2018  . Venous stasis of both lower extremities 03/14/2018  . Colostomy status ([place]) 03/14/2018  . Wound dehiscence, surgical   . Pulmonary edema   . Bronchospasm   . AKI (acute kidney injury) ([place])   . Diverticulitis of large intestine with perforation   . Fecal  peritonitis ([place])   . Fever 02/26/2018  . Abnormal CT scan, sigmoid colon   . Diverticulitis of large intestine without perforation or abscess without bleeding   . Large bowel obstruction ([place])   . Chronic constipation   . Mass of colon 02/20/2018  . DDD (degenerative disc disease), lumbosacral 12/09/2017  . Impaired fasting glucose 12/09/2017  . DVT (deep venous thrombosis) ([place]) 06/04/2017  . Hematoma of right thigh 06/04/2017  . Essential hypertension 06/04/2017  . Acute blood loss anemia 06/04/2017  . Chronic conjunctivitis 10/22/2016  . Chronic back pain 08/21/2016  . Scoliosis of lumbar spine 03/28/2015  . Primary osteoarthritis of right knee 04/27/2014  . Hip pain 02/17/2013  . History of total hip arthroplasty 09/09/2012    Past Surgical History:  Procedure Laterality Date  . ABDOMINAL HYSTERECTOMY     2006 VAG HYST  . ABDOMINAL SURGERY    . APPLICATION OF WOUND VAC  03/10/2018   Procedure: APPLICATION OF WOUND VAC;  Surgeon: [name], MD;  Location: [facility];  Service: General;;  . BACK SURGERY     LOWER, X2  . BIOPSY Right 05/02/2017   Procedure: BIOPSY OF RIGHT UPPER EYELID LESION;  Surgeon: [name], MD;  Location: [facility];  Service: Ophthalmology;  Laterality: Right;  . COLECTOMY WITH COLOSTOMY CREATION/HARTMANN PROCEDURE N/A 02/28/2018   Procedure: PARTIAL COLECTOMY WITH COLOSTOMY;  Surgeon: [name], MD;  Location: [facility];  Service:  General;  Laterality: N/A;  . EYE SURGERY Bilateral    cataract surgery with lens implant  . FLEXIBLE SIGMOIDOSCOPY N/A 02/21/2018   Procedure: FLEXIBLE SIGMOIDOSCOPY;  Surgeon: [name], MD;  [facility];  Service: Endoscopy;  Laterality: N/A;  . JOINT REPLACEMENT     2011 LT HIP  . LAPAROTOMY N/A 03/10/2018   Procedure: EXPLORATORY LAPAROTOMY;  Surgeon: [name], MD;  Location: [facility];  Service: General;  Laterality: N/A;  . POLYPECTOMY  02/21/2018   Procedure: POLYPECTOMY;  Surgeon: [name], MD;  [facility];  Service: Endoscopy;;  rectum  . RECONSTRUCTION OF EYELID Right 05/02/2017   Procedure: TOTAL RECONSTRUCTION OF UPPER EYELID RIGHT EYE WITH FULL THICKNESS SKIN GRAFT FROM RIGHT POSTERIOR EAR;  Surgeon: [name], MD;  Location: [facility];  Service: Ophthalmology;  Laterality: Right;  . SECONDARY CLOSURE OF WOUND  03/10/2018   Procedure: SECONDARY CLOSURE OF WOUND;  Surgeon: [name], MD;  Location: [facility];  Service: General;;  . TONSILLECTOMY    . TOTAL KNEE ARTHROPLASTY Right 04/27/2014   Procedure: [place] TOTAL KNEE ARTHROPLASTY;  Surgeon: Rod Can, MD;  Location: [facility];  Service: Orthopedics;  Laterality: Right;  . TUBAL LIGATION     1974  . VENA CAVA FILTER PLACEMENT N/A 06/04/2017   Procedure: INSERTION VENA-CAVA FILTER;  Surgeon: Angelia Mould, MD;  Location[facility] OR;  Service: Vascular;  Laterality: N/A;    Allergies Penicillins and Vancomycin  Family History  Problem Relation Age of Onset  . Congestive Heart Failure Mother   . Colon cancer Father   . Cancer Brother   . Arthritis Sister   . Asthma Sister   . Diabetes Son   . Diabetes Son     Social History Social History   Tobacco Use  . Smoking status: Never Smoker  . Smokeless tobacco: Never Used  Substance Use Topics  . Alcohol use: Yes    Comment: OCC WINE  . Drug use: No    Review of Systems  Constitutional: No fever/chills Eyes: No visual changes. ENT: No sore throat. Cardiovascular: Denies chest pain. Respiratory: Denies shortness of breath. Gastrointestinal: No abdominal pain. Positive nausea, no vomiting.  No ostomy output x 2 days.  Genitourinary: Negative for dysuria. Musculoskeletal: Negative for back pain. Skin: Negative for rash. Neurological: Negative for headaches, focal weakness or numbness.  10-point ROS otherwise negative.  ____________________________________________   PHYSICAL EXAM:  VITAL SIGNS: ED Triage Vitals  Enc Vitals  Group     BP 04/24/18 1922 (!) 156/67     Pulse Rate 04/24/18 1922 73     Resp 04/24/18 1922 18     Temp 04/24/18 1922 98.3 F (36.8 C)     Temp Source 04/24/18 1922 Oral     SpO2 04/24/18 1922 100 %     Weight 04/24/18 1923 190 lb (86.2 kg)     Height 04/24/18 1923 5\' 6"  (1.676 m)     Pain Score 04/24/18 1926 7   Constitutional: Alert and oriented. Well appearing and in no acute distress. Eyes: Conjunctivae are normal.  Head: Atraumatic. Nose: No congestion/rhinnorhea. Mouth/Throat: Mucous membranes are moist.  Neck: No stridor.  Cardiovascular: Normal rate, regular rhythm. Good peripheral circulation. Grossly normal heart sounds.   Respiratory: Normal respiratory effort.  No retractions. Lungs CTAB. Gastrointestinal: Soft with mild/moderte tenderness diffusely. No rebound or guarding. Mild distension noted.  Musculoskeletal: No gross deformities of extremities. Neurologic:  Normal speech and language.  No gross focal neurologic deficits are appreciated.  Skin:  Skin is warm, dry and intact. No rash noted.  ____________________________________________   LABS (all labs ordered are listed, but only abnormal results are displayed)  Labs Reviewed  COMPREHENSIVE METABOLIC PANEL - Abnormal; Notable for the following components:      Result Value   Potassium 3.4 (*)    Glucose, Bld 112 (*)    BUN 25 (*)    Creatinine, Ser 1.53 (*)    Albumin 3.4 (*)    GFR calc non Af Amer 32 (*)    GFR calc Af Amer 37 (*)    All other components within normal limits  CBC WITH DIFFERENTIAL/PLATELET - Abnormal; Notable for the following components:   RBC 3.72 (*)    Hemoglobin 11.0 (*)    HCT 35.2 (*)    RDW 15.6 (*)    All other components within normal limits  LIPASE, BLOOD   ____________________________________________  RADIOLOGY  Ct Abdomen Pelvis W Contrast  Result Date: 04/24/2018 CLINICAL DATA:  Decreased colostomy output EXAM: CT ABDOMEN AND PELVIS WITH CONTRAST TECHNIQUE:  Multidetector CT imaging of the abdomen and pelvis was performed using the standard protocol following bolus administration of intravenous contrast. CONTRAST:  60mL OMNIPAQUE IOHEXOL 300 MG/ML  SOLN COMPARISON:  CT abdomen pelvis 02/27/2018 FINDINGS: LOWER CHEST: There is no basilar pleural or apical pericardial effusion. HEPATOBILIARY: The hepatic contours and density are normal. There is no intra- or extrahepatic biliary dilatation. Unchanged patent cyst in the right lobe. There is cholelithiasis without acute inflammation. PANCREAS: The pancreatic parenchymal contours are normal and there is no ductal dilatation. There is no peripancreatic fluid collection. SPLEEN: Normal. ADRENALS/URINARY TRACT: --Adrenal glands: Normal. --Right kidney/ureter: No hydronephrosis, nephroureterolithiasis, perinephric stranding or solid renal mass. --Left kidney/ureter: No hydronephrosis, nephroureterolithiasis, perinephric stranding or solid renal mass. --Urinary bladder: Normal for degree of distention STOMACH/BOWEL: Status post partial colectomy with formation of left lower quadrant colostomy. There is a small parastomal hernia containing a loop of unobstructed small bowel. There is no small bowel dilatation. In the pelvis, there is a fluid-filled structure near a staple line, measuring 8.4 x 4.9 x 3.6 cm. There is peripheral enhancement. No free intraperitoneal air. No evidence of colonic inflammation. VASCULAR/LYMPHATIC: There is aortic atherosclerosis without hemodynamically significant stenosis. There is an infrarenal IVC filter. No abdominal or pelvic lymphadenopathy. REPRODUCTIVE: Limited visualization due to streak artifact from hip arthroplasty. MUSCULOSKELETAL. Left total hip arthroplasty.  L4-5 PLIF. OTHER: Prior ventral hernia repair. Packing material overlies ventral abdominal wound. IMPRESSION: 1. Fluid-filled, peripherally enhancing structure within the pelvis, just dorsal to a surgical staple line, most consistent  with intra-abdominal abscess. No free intraperitoneal air. 2. Status post partial colectomy with left lower quadrant colostomy. Small parastomal hernia containing a loop of nonobstructed small bowel. 3.  Aortic atherosclerosis (ICD10-I70.0). Electronically Signed   By: Ulyses Jarred M.D.   On: 04/24/2018 21:47    ____________________________________________   PROCEDURES  Procedure(s) performed:   Procedures  None ____________________________________________   INITIAL IMPRESSION / ASSESSMENT AND PLAN / ED COURSE  Pertinent labs & imaging results that were available during my care of the patient were reviewed by me and considered in my medical decision making (see chart for details).   Patient presents to the emergency department for evaluation of no STEMI output for the past 2 days.  The stoma is not fully visualized.  The inferior portion is not protruding through the ostomy.  No output in the bag.  Mild  to moderate tenderness diffusely on exam.  Suspect obstruction.  The stoma mucosa is not dusky.  Plan for CT imaging along with screening labs, IV fluids.  Patient denies wanting any nausea medication for now.  10:05 PM  Discussed the CT findings, patient presentation, lab work with Dr. Constance Haw.  Clinically my suspicion for intra-abdominal abscess is very low.  Patient has no leukocytosis, fever, other infection symptoms.  No evidence of bowel obstruction.  Does have a parastomal hernia but not causing active obstruction.  Plan for bowel regimen at home.  Dr. Constance Haw will discuss with Dr. Arnoldo Morale who can follow-up with the patient by phone.  Discussed the lab work and CT with the patient who is comfortable with the plan for discharge.  She is overall feeling well here.   I reviewed all nursing notes, vitals, pertinent old records, EKGs, labs, imaging (as available).  ____________________________________________  FINAL CLINICAL IMPRESSION(S) / ED DIAGNOSES  Final diagnoses:   Generalized abdominal pain  Nausea  Constipation, unspecified constipation type     MEDICATIONS GIVEN DURING THIS VISIT:  Medications  sodium chloride 0.9 % bolus 500 mL (0 mLs Intravenous Stopped 04/24/18 2101)  iohexol (OMNIPAQUE) 300 MG/ML solution 75 mL (75 mLs Intravenous Contrast Given 04/24/18 2130)     NEW OUTPATIENT MEDICATIONS STARTED DURING THIS VISIT:  Discharge Medication List as of 04/24/2018 10:17 PM    START taking these medications   Details  senna-docusate (SENOKOT-S) 8.6-50 MG tablet Take 1 tablet by mouth at bedtime as needed for mild constipation., Starting Thu 04/24/2018, Print        Note:  This document was prepared using Dragon voice recognition software and may include unintentional dictation errors.  Nanda Quinton, MD Emergency Medicine    Peta Peachey, [facility], MD 04/25/18 [phone]

## 2018-04-24 NOTE — ED Notes (Signed)
Pt to ct 

## 2018-04-24 NOTE — ED Triage Notes (Signed)
ED c/o inverted stoma site x2 days. Recent discharge from SNF. No output in colostomy x2 days.

## 2018-04-30 ENCOUNTER — Ambulatory Visit: Payer: Medicare Other | Admitting: Vascular Surgery

## 2018-05-01 ENCOUNTER — Telehealth: Payer: Self-pay | Admitting: Family Medicine

## 2018-05-01 NOTE — Telephone Encounter (Signed)
Patient aware surgeon name is mark jenkins.

## 2018-05-01 NOTE — Telephone Encounter (Signed)
PT has called recently seen at ER and was told she needed to follow up with someone for surgery she can't remember the name of the doctor she needed to follow up with, wants to know if we can review the notes to see if we can tell who she needs to follow up with.

## 2018-05-02 ENCOUNTER — Ambulatory Visit (INDEPENDENT_AMBULATORY_CARE_PROVIDER_SITE_OTHER): Payer: Medicare Other | Admitting: Family Medicine

## 2018-05-02 ENCOUNTER — Other Ambulatory Visit: Payer: Self-pay

## 2018-05-02 ENCOUNTER — Telehealth: Payer: Self-pay | Admitting: Cardiovascular Disease

## 2018-05-02 DIAGNOSIS — Z09 Encounter for follow-up examination after completed treatment for conditions other than malignant neoplasm: Secondary | ICD-10-CM

## 2018-05-02 DIAGNOSIS — C801 Malignant (primary) neoplasm, unspecified: Secondary | ICD-10-CM | POA: Diagnosis not present

## 2018-05-02 DIAGNOSIS — M5137 Other intervertebral disc degeneration, lumbosacral region: Secondary | ICD-10-CM | POA: Diagnosis not present

## 2018-05-02 DIAGNOSIS — I4891 Unspecified atrial fibrillation: Secondary | ICD-10-CM | POA: Diagnosis not present

## 2018-05-02 DIAGNOSIS — Z79899 Other long term (current) drug therapy: Secondary | ICD-10-CM

## 2018-05-02 MED ORDER — MONTELUKAST SODIUM 10 MG PO TABS: 10.0000 mg | ORAL_TABLET | Freq: Every morning | ORAL | 3 refills | Status: DC

## 2018-05-02 MED ORDER — LOSARTAN POTASSIUM 25 MG PO TABS: 12.5000 mg | ORAL_TABLET | Freq: Every day | ORAL | 3 refills | Status: DC

## 2018-05-02 NOTE — Telephone Encounter (Signed)
I would switch Lasix from daily dosing to prn dosing. I would resume losartan 12.5 mg daily and check BMET in one week. If BP remains elevated and renal function will tolerate, I would increase to 25 mg daily.

## 2018-05-02 NOTE — Telephone Encounter (Signed)
Recently discharge from [facility]  Was told to call and find out if her medications needed to be adjusted

## 2018-05-02 NOTE — Telephone Encounter (Signed)
Pt says at recent d/c from [facility] for stomach pain that losartan was stopped for abnormal kidney function. Pt says BP has been running high (at home 496P/591M systolic) doesn't remember what diastolic or HR has been running. Says at hospital and nursing home systolic was in the 384Y. Says she was advised to contact Dr Bronson Ing about BP and medications that she could safely take. Pt still taking amiodarone. Denies any cardiac symptoms at this time.

## 2018-05-02 NOTE — Telephone Encounter (Signed)
Patient was told to ask cardiology if any of her meds need to be changed.She was diagnosed with constipation in the ED.Told by her pcp to call us

## 2018-05-02 NOTE — Progress Notes (Signed)
Telephone visit  Subjective: CC: Follow up hospitalization/ nursing home discharge PCP: [name], DO XBM:WUXL [name] is a 84 y.o. female calls for telephone consult today. Patient provides verbal consent for consult held via phone.  Location of patient: home Location of provider: [facility] Others present for call: none  1.  Hospital discharge/nursing home discharge Patient was hospitalized for diverticulitis and concern for colonic obstruction.  Sigmoidectomy demonstrated marked edema.  She had a CT abdomen pelvis which showed free air and therefore she underwent surgical intervention.  She had a Hartmann procedure performed 02/28/2018 and was placed in the ICU postop where she developed new onset atrial fibrillation.  She was started on amiodarone and subsequently converted to sinus rhythm.  She developed peritonitis and underwent a second exploratory surgery and peritoneal washing.  She was noted to have a biopsy which demonstrated serous adenocarcinoma.  She was discharged to a skilled nursing facility for rehabilitation.  Her losartan was discontinued.  She was discharged with amiodarone, Lasix and potassium as well as multiple supplements.  She reports compliance with the amiodarone, Lasix and potassium.  She has had some elevations in blood pressure since discharge from the nursing facility which she was worried about and felt that she needed to resume use of her losartan for.  She is currently receiving home health physical therapy and nursing care.  She had a follow-up CBC for 10/05/2018 which demonstrated improvement in hemoglobin to 11.0 from 7.2 on 03/24/2018.  She has not continued the Protonix because she felt that her hair was falling out from the medication.  She notes that she has discontinued other prescribed medications from the nursing home but she is unable to tell me what medicines she is no longer taking.  She has follow-up with her general surgeon today via telephone but has  not yet scheduled an appointment with her cardiologist, Dr. Bronson Ing in [place].  Additionally, she notes chronic back pain that was treated with Norco in January.  She is wondering if she can get refills on this medication.  She notes that she was seeing a back specialist in [place] and in [place] but that they are not willing to perform the procedure that she wants.  She is asking for a separate referral to a new specialist to further discuss options.  She reports generalized weakness that is improving some with physical therapy but she continues to be nonambulatory (independently) at this point.   ROS: Per HPI  Allergies  Allergen Reactions  . Penicillins Rash and Other (See Comments)    Has patient had a PCN reaction causing immediate rash, facial/tongue/throat swelling, SOB or lightheadedness with hypotension: ###Yes## Has patient had a PCN reaction causing severe rash involving mucus membranes or skin necrosis: No Has patient had a PCN reaction that required hospitalization No Has patient had a PCN reaction occurring within the last 10 years: No If all of the above answers are "NO", then may proceed with Cephalosporin use.   . Vancomycin Shortness Of Breath and Itching   Past Medical History:  Diagnosis Date  . Anemia   . Anxiety    pt denies  . Arthritis    KNEES AND BACK  . Bladder incontinence   . Cancer ([place])    basal cell cancer on nose  . Edema of both feet   . Hypertension   . Pneumonia   . PONV (postoperative nausea and vomiting)    PONV X 1 EPISODE, COMES OUT OF ANESTHESIA FAST, SOME AWARENESS  DURING END OF COLONSCOPY  . Scoliosis   . VRE (vancomycin resistant enterococcus) culture positive     Current Outpatient Medications:  .  acetaminophen (TYLENOL) 325 MG tablet, Take 650 mg by mouth every 6 (six) hours as needed for mild pain or moderate pain. , Disp: , Rfl:  .  amiodarone (PACERONE) 200 MG tablet, Take 200 mg by mouth 2 (two) times daily. , Disp: ,  Rfl:  .  Biotin 1 MG CAPS, Take by mouth., Disp: , Rfl:  .  Cholecalciferol (VITAMIN D3) 5000 units TABS, Take 5,000 Units by mouth daily., Disp: , Rfl:  .  Cranberry 450 MG CAPS, Take 1 capsule by mouth daily. , Disp: , Rfl:  .  docusate sodium (COLACE) 100 MG capsule, Take 100 mg by mouth daily., Disp: , Rfl:  .  ferrous sulfate 325 (65 FE) MG tablet, Take 325 mg by mouth daily with breakfast., Disp: , Rfl:  .  furosemide (LASIX) 20 MG tablet, Take 20 mg by mouth daily., Disp: , Rfl:  .  furosemide (LASIX) 40 MG tablet, , Disp: , Rfl:  .  Glucosamine Sulfate 500 MG TABS, Take 2 tablets by mouth daily. , Disp: , Rfl:  .  magnesium hydroxide (MILK OF MAGNESIA) 400 MG/5ML suspension, Take 30 mLs by mouth daily as needed for mild constipation., Disp: , Rfl:  .  montelukast (SINGULAIR) 10 MG tablet, Take 1 tablet (10 mg total) by mouth every morning., Disp: 30 tablet, Rfl: 3 .  Multiple Vitamin (MULTIVITAMIN) tablet, Take 1 tablet by mouth daily., Disp: , Rfl:  .  NON FORMULARY, Diet Type:  NAS, Disp: , Rfl:  .  Nutritional Supplements (ENSURE ENLIVE PO), Take 1 Bottle by mouth 3 (three) times daily between meals., Disp: , Rfl:  .  Nutritional Supplements (JUVEN) POWD, Juven powder in packet; 7-7-1.5 gram - Give 1 packet mixed in liquid by mouth twice daily between meals, Disp: , Rfl:  .  pantoprazole (PROTONIX) 40 MG tablet, Take 40 mg by mouth 2 (two) times daily., Disp: , Rfl:  .  potassium chloride SA (K-DUR,KLOR-CON) 20 MEQ tablet, Take 40 mEq by mouth daily., Disp: , Rfl:  .  Propylene Glycol-Glycerin (SOOTHE) 0.6-0.6 % SOLN, Place 2 drops into both eyes 4 (four) times daily. And as needed, Disp: , Rfl:  .  psyllium (REGULOID) 0.52 g capsule, Take 0.52 g by mouth daily., Disp: , Rfl:  .  senna-docusate (SENOKOT-S) 8.6-50 MG tablet, Take 1 tablet by mouth at bedtime as needed for mild constipation., Disp: 1 tablet, Rfl: 0 .  vitamin B-12 (CYANOCOBALAMIN) 500 MCG tablet, Take 500 mcg by mouth  daily., Disp: , Rfl:   Assessment/ Plan: 84 y.o. female   1. New onset atrial fibrillation ([place]) I have recommended that she follow-up with her cardiologist as directed.  With regards to her blood pressure, we could consider adding something like amlodipine versus restarting her ARB.  I do see that she had an acute on chronic bump in renal function on her last lab draw in April.  I have asked that she follow-up with cardiology for further recommendations and follow-up of her hospitalization given new onset atrial fibrillation.  2. 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  Unsure if there are plans for referral to oncology at this point.  I will await their notes from today's visit  3. DDD (degenerative disc disease), lumbosacral Patient has requested a second opinion  and I have placed a referral as requested.  Though I did reiterate that there may be limited interventions given frailty and multiple comorbidities.  She voiced good understanding and wishes to proceed with referral - Ambulatory referral to Orthopedic Surgery  4. Hospital discharge follow-up I reviewed her discharge summary, nursing home summaries and recent laboratory results.   Start time: 9:11am End time: 9:35am  Total time spent on patient care (including telephone call/ virtual visit): 32 minutes  [place], [place] [phone]

## 2018-05-02 NOTE — Telephone Encounter (Signed)
Pt will re-start losartan 12.5 mg daily and repeat bmet in 1 week. She will also take Lasix  PRN ( she will take for weight gain over 3 lbs in 24 hours , or 5 lbs in one week ) I will  Mail lab slip to her

## 2018-05-05 ENCOUNTER — Other Ambulatory Visit: Payer: Self-pay

## 2018-05-05 ENCOUNTER — Telehealth: Payer: Self-pay

## 2018-05-05 ENCOUNTER — Ambulatory Visit (INDEPENDENT_AMBULATORY_CARE_PROVIDER_SITE_OTHER): Payer: Medicare Other

## 2018-05-05 DIAGNOSIS — N183 Chronic kidney disease, stage 3 (moderate): Secondary | ICD-10-CM

## 2018-05-05 DIAGNOSIS — I4891 Unspecified atrial fibrillation: Secondary | ICD-10-CM

## 2018-05-05 DIAGNOSIS — K219 Gastro-esophageal reflux disease without esophagitis: Secondary | ICD-10-CM

## 2018-05-05 DIAGNOSIS — J811 Chronic pulmonary edema: Secondary | ICD-10-CM

## 2018-05-05 DIAGNOSIS — T8132XD Disruption of internal operation (surgical) wound, not elsewhere classified, subsequent encounter: Secondary | ICD-10-CM | POA: Diagnosis not present

## 2018-05-05 DIAGNOSIS — D631 Anemia in chronic kidney disease: Secondary | ICD-10-CM

## 2018-05-05 DIAGNOSIS — K658 Other peritonitis: Secondary | ICD-10-CM

## 2018-05-05 DIAGNOSIS — Z433 Encounter for attention to colostomy: Secondary | ICD-10-CM | POA: Diagnosis not present

## 2018-05-05 DIAGNOSIS — I878 Other specified disorders of veins: Secondary | ICD-10-CM

## 2018-05-05 DIAGNOSIS — E43 Unspecified severe protein-calorie malnutrition: Secondary | ICD-10-CM

## 2018-05-05 DIAGNOSIS — I129 Hypertensive chronic kidney disease with stage 1 through stage 4 chronic kidney disease, or unspecified chronic kidney disease: Secondary | ICD-10-CM

## 2018-05-05 DIAGNOSIS — C189 Malignant neoplasm of colon, unspecified: Secondary | ICD-10-CM

## 2018-05-05 DIAGNOSIS — H04129 Dry eye syndrome of unspecified lacrimal gland: Secondary | ICD-10-CM

## 2018-05-05 DIAGNOSIS — I517 Cardiomegaly: Secondary | ICD-10-CM

## 2018-05-05 DIAGNOSIS — G8929 Other chronic pain: Secondary | ICD-10-CM

## 2018-05-05 DIAGNOSIS — M479 Spondylosis, unspecified: Secondary | ICD-10-CM

## 2018-05-05 DIAGNOSIS — M19012 Primary osteoarthritis, left shoulder: Secondary | ICD-10-CM

## 2018-05-05 DIAGNOSIS — Z978 Presence of other specified devices: Secondary | ICD-10-CM

## 2018-05-05 NOTE — Telephone Encounter (Signed)
Thanks for looking into this further.  Yes, I referred to ortho per her request.  She asked for Norco but I explained that she needed an OV for this medication since I'd never rx'd it before.

## 2018-05-05 NOTE — Telephone Encounter (Signed)
Patient called stating that she is supposed to go to the hospital for bloodwork but they do not have any orders. I read through last note and it looks like she was referred to ortho so not sure if she has this confused. Please review and advise

## 2018-05-05 NOTE — Telephone Encounter (Signed)
It appears that Dr Bronson Ing had ordered some labs for her.  See their note.  I think she was supposed to receive a lab slip in the mail according to the phone note.

## 2018-05-05 NOTE — Telephone Encounter (Signed)
Just FYI. Spoke with patient and advised on [facility] orders and she stated that she knew about those and this was different. She said you couldn't fill meds until she had labwork done. I asked if it was for pain medication and she stated it was and I told her that it appeared she was referred to ortho and they should follow up from there.

## 2018-05-06 ENCOUNTER — Other Ambulatory Visit: Payer: Self-pay

## 2018-05-06 DIAGNOSIS — I872 Venous insufficiency (chronic) (peripheral): Secondary | ICD-10-CM

## 2018-05-06 DIAGNOSIS — R609 Edema, unspecified: Secondary | ICD-10-CM

## 2018-05-09 ENCOUNTER — Telehealth: Payer: Self-pay | Admitting: Student

## 2018-05-09 ENCOUNTER — Other Ambulatory Visit: Payer: Self-pay | Admitting: Family Medicine

## 2018-05-09 ENCOUNTER — Other Ambulatory Visit: Payer: Self-pay | Admitting: *Deleted

## 2018-05-09 ENCOUNTER — Other Ambulatory Visit: Payer: Self-pay

## 2018-05-09 DIAGNOSIS — Z95828 Presence of other vascular implants and grafts: Secondary | ICD-10-CM

## 2018-05-09 DIAGNOSIS — I82402 Acute embolism and thrombosis of unspecified deep veins of left lower extremity: Secondary | ICD-10-CM

## 2018-05-09 MED ORDER — PANTOPRAZOLE SODIUM 40 MG PO TBEC: 40.0000 mg | DELAYED_RELEASE_TABLET | Freq: Two times a day (BID) | ORAL | 1 refills | Status: DC

## 2018-05-09 NOTE — Telephone Encounter (Signed)
Please make sure that patient has a follow up with Dr Raliegh Ip in [place], cardiology.  He should be the one prescribing the amiodarone.  I will give her a bridge supply but this should be managed by cardiology.  I will send the PPI.

## 2018-05-09 NOTE — Telephone Encounter (Signed)
Pt gave consent. ° °YOUR CARDIOLOGY TEAM HAS ARRANGED FOR AN E-VISIT FOR YOUR APPOINTMENT - PLEASE REVIEW IMPORTANT INFORMATION BELOW SEVERAL DAYS PRIOR TO YOUR APPOINTMENT ° °Due to the recent COVID-19 pandemic, we are transitioning in-person office visits to tele-medicine visits in an effort to decrease unnecessary exposure to our patients and staff. Medicare and most insurances are covering these visits without a copay needed. We also encourage you to sign up for MyChart if you have not already done so. You will need a smartphone if possible. For patients that do not have this, we can still complete the visit using a regular telephone but do prefer a smartphone to enable video when possible. You may have a close family member that lives with you that can help. If possible, we also ask that you have a blood pressure cuff and scale at home to measure your blood pressure, heart rate and weight prior to your scheduled appointment. Patients with clinical needs that need an in-person evaluation and testing will still be able to come to the office if absolutely necessary. If you have any questions, feel free to call our office. ° ° ° °IF YOU HAVE A SMARTPHONE, PLEASE DOWNLOAD THE WEBEX APP TO YOUR SMARTPHONE ° °- If Apple, go to App Store and type in WebEx in the search bar. Download [employer] Webex Meetings, the blue/green circle. The app is free but as with any other app download, your phone may require you to verify saved payment information or Apple password. You do NOT have to create a WebEx account. ° °- If Android, go to Google Play Store and type in WebEx in the search bar. Download [employer] Webex Meetings, the blue/green circle. The app is free but as with any other app download, your phone may require you to verify saved payment information or Android password. You do NOT have to create a WebEx account. ° °It is very helpful to have this downloaded before your visit. ° ° ° °2-3 DAYS BEFORE YOUR APPOINTMENT ° °You  will receive a telephone call from one of our [facility] team members - your caller ID may say "Unknown caller." If this is a video visit, we will confirm that you have been able to download the WebEx app. We will remind you check your blood pressure, heart rate and weight prior to your scheduled appointment. If you have an Apple Watch or Kardia, please upload any pertinent ECG strips the day before or morning of your appointment to MyChart. Our staff will also make sure you have reviewed the consent and agree to move forward with your scheduled tele-health visit.  ° ° ° °THE DAY OF YOUR APPOINTMENT ° °Approximately 15 minutes prior to your scheduled appointment, you will receive a telephone call from one of [facility] team - your caller ID may say "Unknown caller."  Our staff will confirm medications, vital signs for the day and any symptoms you may be experiencing. Please have this information available prior to the time of visit start. It may also be helpful for you to have a pad of paper and pen handy for any instructions given during your visit. They will also walk you through joining the WebEx smartphone meeting if this is a video visit. ° ° ° °CONSENT FOR TELE-HEALTH VISIT - PLEASE REVIEW ° °I hereby voluntarily request, consent and authorize [facility] and its employed or contracted physicians, physician assistants, nurse practitioners or other licensed health care professionals (the Practitioner), to provide me with telemedicine health care   services (the “Services") as deemed necessary by the treating Practitioner. I acknowledge and consent to receive the Services by the Practitioner via telemedicine. I understand that the telemedicine visit will involve communicating with the Practitioner through live audiovisual communication technology and the disclosure of certain medical information by electronic transmission. I acknowledge that I have been given the opportunity to request an in-person assessment or  other available alternative prior to the telemedicine visit and am voluntarily participating in the telemedicine visit. ° °I understand that I have the right to withhold or withdraw my consent to the use of telemedicine in the course of my care at any time, without affecting my right to future care or treatment, and that the Practitioner or I may terminate the telemedicine visit at any time. I understand that I have the right to inspect all information obtained and/or recorded in the course of the telemedicine visit and may receive copies of available information for a reasonable fee.  I understand that some of the potential risks of receiving the Services via telemedicine include:  °• Delay or interruption in medical evaluation due to technological equipment failure or disruption; °• Information transmitted may not be sufficient (e.g. poor resolution of images) to allow for appropriate medical decision making by the Practitioner; and/or  °• In rare instances, security protocols could fail, causing a breach of personal health information. ° °Furthermore, I acknowledge that it is my responsibility to provide information about my medical history, conditions and care that is complete and accurate to the best of my ability. I acknowledge that Practitioner's advice, recommendations, and/or decision may be based on factors not within their control, such as incomplete or inaccurate data provided by me or distortions of diagnostic images or specimens that may result from electronic transmissions. I understand that the practice of medicine is not an exact science and that Practitioner makes no warranties or guarantees regarding treatment outcomes. I acknowledge that I will receive a copy of this consent concurrently upon execution via email to the email address I last provided but may also request a printed copy by calling the office of [facility].   ° °I understand that my insurance will be billed for this visit.  ° °I  have read or had this consent read to me. °• I understand the contents of this consent, which adequately explains the benefits and risks of the Services being provided via telemedicine.  °• I have been provided ample opportunity to ask questions regarding this consent and the Services and have had my questions answered to my satisfaction. °• I give my informed consent for the services to be provided through the use of telemedicine in my medical care ° °By participating in this telemedicine visit I agree to the above. ° °

## 2018-05-09 NOTE — Telephone Encounter (Signed)
What is the name of the medication? Regina Hall 40mg  tablet take 1 tablet twice a day, amiodarone 200mg  tablet twice a day  Have you contacted your pharmacy to request a refill? Yes but she needs approval from PCP these were given at hospital  Which pharmacy would you like this sent to? [facility] [address], [facility]   Patient notified that their request is being sent to the clinical staff for review and that they should receive a call once it is complete. If they do not receive a call within 24 hours they can check with their pharmacy or our office.

## 2018-05-09 NOTE — Telephone Encounter (Signed)
I am going to reach out to Dr Bronson Ing about the Amiodarone. It appears that the intention was to keep on Amio 200mg  BID x1 month for new onset afib.  Then f/u with cardiology for reduction of dose.  I don't see where she has been seen yet so I want their input on dose.  I am cc'd Dr Raliegh Ip in [place].

## 2018-05-12 ENCOUNTER — Other Ambulatory Visit (HOSPITAL_COMMUNITY)
Admission: RE | Admit: 2018-05-12 | Discharge: 2018-05-12 | Disposition: A | Discharge status: Home / Self Care | Payer: Medicare Other | Source: Ambulatory Visit | Attending: Cardiovascular Disease | Admitting: Cardiovascular Disease

## 2018-05-12 ENCOUNTER — Telehealth: Payer: Self-pay | Admitting: Cardiovascular Disease

## 2018-05-12 ENCOUNTER — Ambulatory Visit (HOSPITAL_COMMUNITY)
Admission: RE | Admit: 2018-05-12 | Discharge: 2018-05-12 | Disposition: A | Discharge status: Home / Self Care | Payer: Medicare Other | Source: Ambulatory Visit | Attending: Vascular Surgery | Admitting: Vascular Surgery

## 2018-05-12 ENCOUNTER — Other Ambulatory Visit: Payer: Self-pay

## 2018-05-12 DIAGNOSIS — Z95828 Presence of other vascular implants and grafts: Secondary | ICD-10-CM | POA: Insufficient documentation

## 2018-05-12 DIAGNOSIS — I82402 Acute embolism and thrombosis of unspecified deep veins of left lower extremity: Secondary | ICD-10-CM | POA: Diagnosis present

## 2018-05-12 DIAGNOSIS — Z79899 Other long term (current) drug therapy: Secondary | ICD-10-CM | POA: Insufficient documentation

## 2018-05-12 LAB — BASIC METABOLIC PANEL
Anion gap: 7 (ref 5–15)
BUN: 20 mg/dL (ref 8–23)
CO2: 26 mmol/L (ref 22–32)
Calcium: 9.3 mg/dL (ref 8.9–10.3)
Chloride: 105 mmol/L (ref 98–111)
Creatinine, Ser: 1.23 mg/dL — ABNORMAL HIGH (ref 0.44–1.00)
GFR calc Af Amer: 48 mL/min — ABNORMAL LOW (ref >60)
GFR calc non Af Amer: 41 mL/min — ABNORMAL LOW (ref >60)
Glucose, Bld: 142 mg/dL — ABNORMAL HIGH (ref 70–99)
Potassium: 3.8 mmol/L (ref 3.5–5.1)
Sodium: 138 mmol/L (ref 135–145)

## 2018-05-12 NOTE — Telephone Encounter (Signed)
Pt wanted to make sure her dose of losartan was correct I informed her it was .

## 2018-05-12 NOTE — Telephone Encounter (Signed)
Pt is needing to speak w/ nurse about losartan (COZAAR) 25 MG tablet [[id number]]

## 2018-05-13 ENCOUNTER — Telehealth (HOSPITAL_COMMUNITY): Payer: Self-pay | Admitting: Rehabilitation

## 2018-05-13 NOTE — Telephone Encounter (Signed)
The above patient or their representative was contacted and gave the following answers to these questions:         Do you have any of the following symptoms? No  Fever                    Cough                   Shortness of breath  Do  you have any of the following other symptoms? No   muscle pain         vomiting,        diarrhea        rash         weakness        red eye        abdominal pain         bruising          bruising or bleeding              joint pain           severe headache    Have you been in contact with someone who was or has been sick in the past 2 weeks? No  Yes                 Unsure                         Unable to assess   Does the person that you were in contact with have any of the following symptoms?   Cough         shortness of breath           muscle pain         vomiting,            diarrhea            rash            weakness           fever            red eye           abdominal pain           bruising  or  bleeding                joint pain                severe headache               Have you  or someone you have been in contact with traveled internationally in th last month? No        If yes, which countries?   Have you  or someone you have been in contact with traveled outside [place] in th last month? No         If yes, which state and city?   COMMENTS OR ACTION PLAN FOR THIS PATIENT:          

## 2018-05-14 ENCOUNTER — Telehealth: Payer: Self-pay

## 2018-05-14 ENCOUNTER — Other Ambulatory Visit: Payer: Self-pay

## 2018-05-14 ENCOUNTER — Other Ambulatory Visit (HOSPITAL_COMMUNITY): Payer: Self-pay | Admitting: Vascular Surgery

## 2018-05-14 ENCOUNTER — Encounter: Payer: Self-pay | Admitting: Vascular Surgery

## 2018-05-14 ENCOUNTER — Ambulatory Visit (HOSPITAL_COMMUNITY)
Admission: RE | Admit: 2018-05-14 | Discharge: 2018-05-14 | Disposition: A | Discharge status: Home / Self Care | Payer: Medicare Other | Source: Ambulatory Visit | Attending: Family | Admitting: Family

## 2018-05-14 ENCOUNTER — Ambulatory Visit (INDEPENDENT_AMBULATORY_CARE_PROVIDER_SITE_OTHER): Payer: Medicare Other | Admitting: Vascular Surgery

## 2018-05-14 VITALS — BP 145/69 | HR 64 | Temp 97.3°F | Resp 20 | Ht 66.0 in | Wt 198.0 lb

## 2018-05-14 DIAGNOSIS — M79661 Pain in right lower leg: Secondary | ICD-10-CM | POA: Insufficient documentation

## 2018-05-14 DIAGNOSIS — I82402 Acute embolism and thrombosis of unspecified deep veins of left lower extremity: Secondary | ICD-10-CM | POA: Diagnosis not present

## 2018-05-14 DIAGNOSIS — Z95828 Presence of other vascular implants and grafts: Secondary | ICD-10-CM | POA: Diagnosis not present

## 2018-05-14 DIAGNOSIS — I872 Venous insufficiency (chronic) (peripheral): Secondary | ICD-10-CM

## 2018-05-14 MED ORDER — RIVAROXABAN (XARELTO) VTE STARTER PACK (15 & 20 MG): ORAL_TABLET | ORAL | 0 refills | Status: DC

## 2018-05-14 NOTE — Progress Notes (Signed)
Patient name: Regina Hall MRN: [id number] DOB: 04/17/1934 Sex: female  REASON FOR VISIT:   Follow-up of DVT and IVC filter.  HPI:   Regina Hall is a pleasant 84 y.o. female who I last saw on 01/29/2018.  I had originally seen the patient in July 2019.  She had presented with a left lower extremity DVT.  She was on Coumadin and developed a significant right thigh hematoma.  Given the contraindication to heparin at the time an IVC filter was placed. This was a Actor that was placed via a right femoral approach on 06/04/2017.  The patient was not felt to be a good candidate for long-term anticoagulation given that she had a spontaneous hematoma in her right thigh.  I felt that she was at high risk for anticoagulation and therefore did not favor removing her filter.  I ordered an abdominal x-ray at a follow-up visit in 3 months and she returns for that visit.  The patient also has significant chronic venous insufficiency and lymphedema.  We discussed the importance of intermittent leg elevation and the proper positioning for this.  I wrote her a prescription for knee-high compression stocking with a mild gradient given that she had a very hard time getting tighter stockings on.  Of note we also did submit her information to see if she could get approved for a Flexitouch Plus pneumatic compression system.  She apparently had measurements may for the pneumatic compression system however soon after that was hospitalized with what sounds like bowel perforation and she required a colostomy.  This was at [facility] in February according to the patient.  Since that time her activity has been very limited and she is pretty much been limited to the wheelchair.  She denies any significant chest pain or shortness of breath.  She has had persistent bilateral lower extremity swelling which is now worse on the right side.  She tries to elevate her legs but this does not completely relieve her  swelling.  Her left lower extremity swelling has actually improved however she notes that she has gradually developed worsening right lower extremity swelling.  This is occurred over the last several months.  Past Medical History:  Diagnosis Date  . Anemia   . Anxiety    pt denies  . Arthritis    KNEES AND BACK  . Bladder incontinence   . Cancer ([place])    basal cell cancer on nose  . Edema of both feet   . Hypertension   . Pneumonia   . PONV (postoperative nausea and vomiting)    PONV X 1 EPISODE, COMES OUT OF ANESTHESIA FAST, SOME AWARENESS DURING END OF COLONSCOPY  . Scoliosis   . VRE (vancomycin resistant enterococcus) culture positive     Family History  Problem Relation Age of Onset  . Congestive Heart Failure Mother   . Colon cancer Father   . Cancer Brother   . Arthritis Sister   . Asthma Sister   . Diabetes Son   . Diabetes Son     SOCIAL HISTORY: Social History   Tobacco Use  . Smoking status: Never Smoker  . Smokeless tobacco: Never Used  Substance Use Topics  . Alcohol use: Yes    Comment: OCC WINE    Allergies  Allergen Reactions  . Penicillins Rash and Other (See Comments)    Has patient had a PCN reaction causing immediate rash, facial/tongue/throat swelling, SOB or lightheadedness with hypotension: ###Yes## Has  patient had a PCN reaction causing severe rash involving mucus membranes or skin necrosis: No Has patient had a PCN reaction that required hospitalization No Has patient had a PCN reaction occurring within the last 10 years: No If all of the above answers are "NO", then may proceed with Cephalosporin use.   . Vancomycin Shortness Of Breath and Itching    Current Outpatient Medications  Medication Sig Dispense Refill  . acetaminophen (TYLENOL) 325 MG tablet Take 650 mg by mouth every 6 (six) hours as needed for mild pain or moderate pain.     Marland Kitchen amiodarone (PACERONE) 200 MG tablet Take 200 mg by mouth 2 (two) times daily.     . Biotin 1  MG CAPS Take by mouth.    . Cholecalciferol (VITAMIN D3) 5000 units TABS Take 5,000 Units by mouth daily.    . Cranberry 450 MG CAPS Take 1 capsule by mouth daily.     Marland Kitchen docusate sodium (COLACE) 100 MG capsule Take 100 mg by mouth daily.    . ferrous sulfate 325 (65 FE) MG tablet Take 325 mg by mouth daily with breakfast.    . furosemide (LASIX) 20 MG tablet Take 20 mg by mouth daily.    . Glucosamine Sulfate 500 MG TABS Take 2 tablets by mouth daily.     Marland Kitchen losartan (COZAAR) 25 MG tablet Take 0.5 tablets (12.5 mg total) by mouth daily. 90 tablet 3  . magnesium hydroxide (MILK OF MAGNESIA) 400 MG/5ML suspension Take 30 mLs by mouth daily as needed for mild constipation.    . montelukast (SINGULAIR) 10 MG tablet Take 1 tablet (10 mg total) by mouth every morning. 30 tablet 3  . Multiple Vitamin (MULTIVITAMIN) tablet Take 1 tablet by mouth daily.    . NON FORMULARY Diet Type:  NAS    . Nutritional Supplements (ENSURE ENLIVE PO) Take 1 Bottle by mouth 3 (three) times daily between meals.    . Nutritional Supplements (JUVEN) POWD Juven powder in packet; 7-7-1.5 gram - Give 1 packet mixed in liquid by mouth twice daily between meals    . pantoprazole (PROTONIX) 40 MG tablet Take 1 tablet (40 mg total) by mouth 2 (two) times daily. 60 tablet 1  . potassium chloride SA (K-DUR,KLOR-CON) 20 MEQ tablet Take 40 mEq by mouth daily.    Marland Kitchen Propylene Glycol-Glycerin (SOOTHE) 0.6-0.6 % SOLN Place 2 drops into both eyes 4 (four) times daily. And as needed    . psyllium (REGULOID) 0.52 g capsule Take 0.52 g by mouth daily.    Marland Kitchen senna-docusate (SENOKOT-S) 8.6-50 MG tablet Take 1 tablet by mouth at bedtime as needed for mild constipation. 1 tablet 0  . vitamin B-12 (CYANOCOBALAMIN) 500 MCG tablet Take 500 mcg by mouth daily.     No current facility-administered medications for this visit.     REVIEW OF SYSTEMS:  [X]  denotes positive finding, [ ]  denotes negative finding Cardiac  Comments:  Chest pain or chest  pressure:    Shortness of breath upon exertion: x   Short of breath when lying flat:    Irregular heart rhythm:        Vascular    Pain in calf, thigh, or hip brought on by ambulation:    Pain in feet at night that wakes you up from your sleep:     Blood clot in your veins:    Leg swelling:  x  right greater than left      Pulmonary  Oxygen at home:    Productive cough:     Wheezing:         Neurologic    Sudden weakness in arms or legs:     Sudden numbness in arms or legs:     Sudden onset of difficulty speaking or slurred speech:    Temporary loss of vision in one eye:     Problems with dizziness:         Gastrointestinal    Blood in stool:     Vomited blood:         Genitourinary    Burning when urinating:     Blood in urine:        Psychiatric    Major depression:         Hematologic    Bleeding problems:    Problems with blood clotting too easily:        Skin    Rashes or ulcers:        Constitutional    Fever or chills:     PHYSICAL EXAM:   Vitals:   05/14/18 1053  BP: (!) 145/69  Pulse: 64  Resp: 20  Temp: (!) 97.3 F (36.3 C)  SpO2: 96%  Weight: 198 lb (89.8 kg)  Height: 5\' 6"  (1.676 m)    GENERAL: The patient is a well-nourished female, in no acute distress. The vital signs are documented above. CARDIAC: There is a regular rate and rhythm.  VASCULAR: I do not detect carotid bruits. Both feet are warm and well-perfused although I cannot palpate pulses because of her swelling. She has bilateral lower extremity swelling which is more significant on the right side. PULMONARY: There is good air exchange bilaterally without wheezing or rales. ABDOMEN: Soft and non-tender with normal pitched bowel sounds.  MUSCULOSKELETAL: There are no major deformities or cyanosis. NEUROLOGIC: No focal weakness or paresthesias are detected. SKIN: There are no ulcers or rashes noted. PSYCHIATRIC: The patient has a normal affect.  DATA:    X-RAY: I have  reviewed the images of her x-ray that was performed on 05/12/2018.  The IVC filter remains in good position and is unchanged compared to previous studies.  It is at the L2-L3 level.  VENOUS DUPLEX: I have independently interpreted her venous duplex scan today.  This was obtained given her new right lower extremity swelling.  The patient was noted to have deep venous thrombosis involving the right common femoral vein and femoral vein.  The distal external iliac vein appeared to be compressible and patent.  The calf veins could not be visualized secondary to her edema.  MEDICAL ISSUES:   NEW RIGHT LOWER EXTREMITY DVT: This patient previously had a left lower extremity DVT and no DVT on the right.  She has no developed an acute right lower extremity DVT.  She has not been on anticoagulation because of a previous thigh hematoma.  Given the new DVT however I have placed her on Xarelto.  I have explained that certainly there will be some increased risk of bleeding given her history with this and we will need to follow her closely.  However without anticoagulation certainly there is risk of propagation of her clot.  Fortunately she does have a IVC filter which is in good position.  We have discussed the the risks and benefits of both anticoagulation and not anticoagulating and I feel that the best option would be to proceed with anticoagulation.  I have ordered a follow-up duplex scan in 3 months  and I will see her back at that time.  Based on this study we may be able to discontinue her Xarelto at that time.  Certainly given her history she will be at increased risk for DVT in the future but is also increased risk for bleeding.  STATUS POST IVC FILTER: Her IVC filter is in good position based on her x-ray.  As per my previous notes we do not have plans to remove this filter.  When I see her back in 3 months we can schedule her for a plain x-ray of her abdomen 1 year from today.  CHRONIC VENOUS INSUFFICIENCY: She  was being evaluated for a pneumatic compression sleeve and that has take measurements once.  Given his new onset of DVT we will have to hold off on obtaining this.Deitra [facility] [phone]

## 2018-05-14 NOTE — Telephone Encounter (Signed)
FYI, patient declined orthopedic referral.

## 2018-05-16 ENCOUNTER — Encounter: Payer: Self-pay | Admitting: Student

## 2018-05-16 ENCOUNTER — Other Ambulatory Visit: Payer: Self-pay | Admitting: Cardiovascular Disease

## 2018-05-16 ENCOUNTER — Telehealth (INDEPENDENT_AMBULATORY_CARE_PROVIDER_SITE_OTHER): Payer: Medicare Other | Admitting: Student

## 2018-05-16 VITALS — BP 140/80 | Wt 198.0 lb

## 2018-05-16 DIAGNOSIS — Z7189 Other specified counseling: Secondary | ICD-10-CM

## 2018-05-16 DIAGNOSIS — I9789 Other postprocedural complications and disorders of the circulatory system, not elsewhere classified: Secondary | ICD-10-CM

## 2018-05-16 DIAGNOSIS — D649 Anemia, unspecified: Secondary | ICD-10-CM

## 2018-05-16 DIAGNOSIS — I4891 Unspecified atrial fibrillation: Secondary | ICD-10-CM | POA: Diagnosis not present

## 2018-05-16 DIAGNOSIS — I82501 Chronic embolism and thrombosis of unspecified deep veins of right lower extremity: Secondary | ICD-10-CM

## 2018-05-16 DIAGNOSIS — I10 Essential (primary) hypertension: Secondary | ICD-10-CM

## 2018-05-16 MED ORDER — LOSARTAN POTASSIUM 25 MG PO TABS: 12.5000 mg | ORAL_TABLET | Freq: Every day | ORAL | 1 refills | Status: DC

## 2018-05-16 NOTE — Patient Instructions (Signed)
Medication Instructions:  Your physician has recommended you make the following change in your medication:  STOP taking Amiodarone   Labwork: NONE  Testing/Procedures: NONE  Follow-Up: Your physician recommends that you schedule a follow-up appointment in: 3-4 Months with Dr. Bronson Ing.   Any Other Special Instructions Will Be Listed Below (If Applicable).  Your physician has requested that you regularly monitor and record your blood pressure and heart rate readings at home. Please use the same machine at the same time of day to check your readings and record them to bring to your follow-up visit.     If you need a refill on your cardiac medications before your next appointment, please call your pharmacy.  Thank you for choosing [facility]!

## 2018-05-16 NOTE — Progress Notes (Signed)
Virtual Visit via Telephone Note   This visit type was conducted due to national recommendations for restrictions regarding the COVID-19 Pandemic (e.g. social distancing) in an effort to limit this patient's exposure and mitigate transmission in our community.  Due to her co-morbid illnesses, this patient is at least at moderate risk for complications without adequate follow up.  This format is felt to be most appropriate for this patient at this time.  The patient did not have access to video technology/had technical difficulties with video requiring transitioning to audio format only (telephone).  All issues noted in this document were discussed and addressed.  No physical exam could be performed with this format.  Please refer to the patient's chart for her  consent to telehealth for [facility].   Date:  05/16/2018   ID:  [name], DOB 1934/07/08, MRN [id number]  Patient Location: Home Provider Location: Home  PCP:  [name], DO  Cardiologist:  Kate Sable, MD  Electrophysiologist:  None   Evaluation Performed:  Follow-Up Visit  Chief Complaint:  Hospital Follow-Up  History of Present Illness:    Regina Hall is a 84 y.o. female with past medical history of DVT (s/p IVC filter), HTN, and anemia who presents for a telemedicine visit in regards to medication management.   She was last examined by Dr. Bronson Ing in 03/2017 and reported having intermittent lower extremity edema with weight stable at 237 lbs. Was continued on Lasix PRN at that time. In the interim, she was admitted to [facility] in 02/2018 for abdominal pain and found to have a large bowel obstruction and sigmoidoscopy biopsy positive for adenocarcinoma. Also had acute diverticulitis with perforation. She required Hartmann procedure on 2/14 and went back to the OR on 2/24 for exploration and reclosure of her abdominal wound. During admission. She went into new-onset atrial fibrillation with RVR and  was started on IV Amiodarone due to hypotension with return to NSR after 4-5 hours. Was discharged on Amiodarone 200mg  BID with plans to remain on Amiodarone for 1 month then follow-up with Cardiology. Was not started on anticoagulation given her brief episode and concurrent anemia. Was discharged to SNF but has not returned home.    Following discharge, she did follow-up with Dr. Scot Dock and was found to have an acute right lower extremity DVT. She was started on anticoagulation with plans for a repeat scan in 3 months.   In talking with the patient today, she reports "feeling terrible" since her hospital stay as she is frustrated with the recent diagnosis of her DVT and having to be on anticoagulation. She says she continues to feel weak but is working with [place] multiple times per week. Denies any specific dyspnea on exertion or chest pain. No recent palpitations, orthopnea, or PND. She does experience intermittent lower extremity edema but has not utilized Lasix recently. Says weight has been stable at 198 lbs on her home scales.   She reports improvement in her appetite and says she monitors her sodium and fluid intake.   The patient does not have symptoms concerning for COVID-19 infection (fever, chills, cough, or new shortness of breath).    Past Medical History:  Diagnosis Date  . Anemia   . Anxiety    pt denies  . Arthritis    KNEES AND BACK  . Bladder incontinence   . Cancer ([place])    basal cell cancer on nose  . Edema of both feet   .  Hypertension   . Pneumonia   . PONV (postoperative nausea and vomiting)    PONV X 1 EPISODE, COMES OUT OF ANESTHESIA FAST, SOME AWARENESS DURING END OF COLONSCOPY  . Scoliosis   . VRE (vancomycin resistant enterococcus) culture positive    Past Surgical History:  Procedure Laterality Date  . ABDOMINAL HYSTERECTOMY     2006 VAG HYST  . ABDOMINAL SURGERY    . APPLICATION OF WOUND VAC  03/10/2018   Procedure: APPLICATION OF WOUND  VAC;  Surgeon: [name], MD;  Location: [facility];  Service: General;;  . BACK SURGERY     LOWER, X2  . BIOPSY Right 05/02/2017   Procedure: BIOPSY OF RIGHT UPPER EYELID LESION;  Surgeon: [name], MD;  Location: [place];  Service: Ophthalmology;  Laterality: Right;  . COLECTOMY WITH COLOSTOMY CREATION/HARTMANN PROCEDURE N/A 02/28/2018   Procedure: PARTIAL COLECTOMY WITH COLOSTOMY;  Surgeon: [name], MD;  Location: [facility];  Service: General;  Laterality: N/A;  . EYE SURGERY Bilateral    cataract surgery with lens implant  . FLEXIBLE SIGMOIDOSCOPY N/A 02/21/2018   Procedure: FLEXIBLE SIGMOIDOSCOPY;  Surgeon: [name], MD;  Location[facility];  Service: Endoscopy;  Laterality: N/A;  . JOINT REPLACEMENT     2011 LT HIP  . LAPAROTOMY N/A 03/10/2018   Procedure: EXPLORATORY LAPAROTOMY;  Surgeon: [name], MD;  Location: [facility];  Service: General;  Laterality: N/A;  . POLYPECTOMY  02/21/2018   Procedure: POLYPECTOMY;  Surgeon: [name], MD;  [facility];  Service: Endoscopy;;  rectum  . RECONSTRUCTION OF EYELID Right 05/02/2017   Procedure: TOTAL RECONSTRUCTION OF UPPER EYELID RIGHT EYE WITH FULL THICKNESS SKIN GRAFT FROM RIGHT POSTERIOR EAR;  Surgeon: [name], MD;  Location: [place];  Service: Ophthalmology;  Laterality: Right;  . SECONDARY CLOSURE OF WOUND  03/10/2018   Procedure: SECONDARY CLOSURE OF WOUND;  Surgeon: [name], MD;  Location: [facility];  Service: General;;  . TONSILLECTOMY    . TOTAL KNEE ARTHROPLASTY Right 04/27/2014   Procedure: [place] TOTAL KNEE ARTHROPLASTY;  Surgeon: Rod Can, MD;  Location: [facility];  Service: Orthopedics;  Laterality: Right;  . TUBAL LIGATION     1974  . VENA CAVA FILTER PLACEMENT N/A 06/04/2017   Procedure: INSERTION VENA-CAVA FILTER;  Surgeon: Angelia Mould, MD;  Location: [facility] OR;  Service: Vascular;  Laterality: N/A;     Current Meds  Medication Sig  . Biotin 1 MG CAPS Take by mouth.   . Cholecalciferol (VITAMIN D3) 5000 units TABS Take 5,000 Units by mouth daily.  . Cranberry 450 MG CAPS Take 1 capsule by mouth daily.   Marland Kitchen docusate sodium (COLACE) 100 MG capsule Take 100 mg by mouth daily.  . furosemide (LASIX) 20 MG tablet Take 20 mg by mouth daily as needed.   . Glucosamine Sulfate 500 MG TABS Take 2 tablets by mouth daily.   . magnesium hydroxide (MILK OF MAGNESIA) 400 MG/5ML suspension Take 30 mLs by mouth daily as needed for mild constipation.  . montelukast (SINGULAIR) 10 MG tablet Take 1 tablet (10 mg total) by mouth every morning.  . Multiple Vitamin (MULTIVITAMIN) tablet Take 1 tablet by mouth daily.  . NON FORMULARY Diet Type:  NAS  . Nutritional Supplements (ENSURE ENLIVE PO) Take 1 Bottle by mouth 3 (three) times daily between meals.  . pantoprazole (PROTONIX) 40 MG tablet Take 1 tablet (40 mg total) by mouth 2 (two) times daily.  Marland Kitchen  potassium chloride SA (K-DUR,KLOR-CON) 20 MEQ tablet Take 40 mEq by mouth daily.  Marland Kitchen Propylene Glycol-Glycerin (SOOTHE) 0.6-0.6 % SOLN Place 2 drops into both eyes 4 (four) times daily. And as needed  . psyllium (REGULOID) 0.52 g capsule Take 0.52 g by mouth daily.  . Rivaroxaban 15 & 20 MG TBPK Take as directed on package: Start with one 15mg  tablet by mouth twice a day with food. On Day 22, switch to one 20mg  tablet once a day with food.  . senna-docusate (SENOKOT-S) 8.6-50 MG tablet Take 1 tablet by mouth at bedtime as needed for mild constipation.  . vitamin B-12 (CYANOCOBALAMIN) 500 MCG tablet Take 500 mcg by mouth daily.  . [DISCONTINUED] amiodarone (PACERONE) 200 MG tablet Take 200 mg by mouth 2 (two) times daily.   . [DISCONTINUED] amiodarone (PACERONE) 200 MG tablet TAKE 1 TABLET BY MOUTH TWICE DAILY  . [DISCONTINUED] losartan (COZAAR) 25 MG tablet Take 0.5 tablets (12.5 mg total) by mouth daily.     Allergies:   Penicillins and Vancomycin   Social History   Tobacco Use  . Smoking status: Never Smoker  . Smokeless  tobacco: Never Used  Substance Use Topics  . Alcohol use: Yes    Comment: OCC WINE  . Drug use: No     Family Hx: The patient's family history includes Arthritis in her sister; Asthma in her sister; Cancer in her brother; Colon cancer in her father; Congestive Heart Failure in her mother; Diabetes in her son and son.  ROS:   Please see the history of present illness.     All other systems reviewed and are negative.   Prior CV studies:   The following studies were reviewed today:  Echocardiogram: 02/2018 IMPRESSIONS   1. The left ventricle has normal systolic function with an ejection fraction of 60-65%. The cavity size was normal. There is mildly increased left ventricular wall thickness. Left ventricular diastolic parameters were normal.  2. The right ventricle has normal systolic function. The cavity was normal. There is no increase in right ventricular wall thickness.  3. Left atrial size was mildly dilated.  4. The mitral valve is degenerative. Mild thickening of the mitral valve leaflet. Mild calcification of the mitral valve leaflet.  5. The tricuspid valve is normal in structure.  6. The aortic valve is tricuspid Mild thickening of the aortic valve Mild calcification of the aortic valve.  7. The pulmonic valve was normal in structure.  8. The inferior vena cava was dilated in size with >50% respiratory variability.  9. Right atrial pressure is estimated at 8 mmHg.  Labs/Other Tests and Data Reviewed:    EKG:  An ECG dated 02/28/2018 was personally reviewed today and demonstrated:  atrial fibrillation with RVR, HR 127.  Recent Labs: 02/27/2018: B Natriuretic Peptide 325.0 03/07/2018: Magnesium 1.8 04/24/2018: ALT 21; Hemoglobin 11.0; Platelets 287 05/12/2018: BUN 20; Creatinine, Ser 1.23; Potassium 3.8; Sodium 138   Recent Lipid Panel No results found for: CHOL, TRIG, HDL, CHOLHDL, LDLCALC, LDLDIRECT  Wt Readings from Last 3 Encounters:  05/16/18 198 lb (89.8 kg)   05/14/18 198 lb (89.8 kg)  04/24/18 190 lb (86.2 kg)     Objective:    Vital Signs:  BP 140/80   Wt 198 lb (89.8 kg)   BMI 31.96 kg/m    General: Pleasant, female sounding in NAD Psych: Normal affect. Neuro: Alert and oriented X 3.  Lungs:  Respirations sound regular and unlabored while talking on the phone.  ASSESSMENT & PLAN:    1. Post-Operative Atrial Fibrillation - diagnosed during admission for large bowel obstruction and acute diverticulitis with perforation. She converted back to NSR within 5 hours of onset with the use of IV Amiodarone. She had remained on PO Amiodarone 200mg  BID and given no recurrences of her arrhythmia over the past 2 months, I reviewed with Dr. Bronson Ing this could be discontinued.  - I did discuss with the patient that she would benefit from a cardiac monitor in the future to rule-out any recurrences of atrial fibrillation, especially in the setting of recurrent DVT's. She wishes to wait until her current physical health has improved prior to wearing a monitor.   2. HTN - reports BP has overall been stable when checked by Home Health with readings ranging from the 120's-140's/80's. Remains on Losartan 12.5mg  daily. I encouraged her to continue to follow readings as this could be titrated to 25mg  daily if needed in the future.   3. Right Lower Extremity DVT - followed by Vascular Surgery. She is s/p IVC filter but was recently started on Xarelto given her diagnosis of a new DVT. She is scheduled for repeat imaging in 3 months.   4. Anemia - Hgb stable at 9.3 when checked in 04/2018. She denies any evidence of active bleeding. Will need to follow closely with the use of anticoagulation.   5. COVID-19 Education - The signs and symptoms of COVID-19 were discussed with the patient. The importance of social distancing was discussed today.  Time:   Today, I have spent 27 minutes with the patient with telehealth technology discussing the above problems.      Medication Adjustments/Labs and Tests Ordered: Current medicines are reviewed at length with the patient today.  Concerns regarding medicines are outlined above.   Tests Ordered: No orders of the defined types were placed in this encounter.   Medication Changes: Meds ordered this encounter  Medications  . losartan (COZAAR) 25 MG tablet    Sig: Take 0.5 tablets (12.5 mg total) by mouth daily.    Dispense:  90 tablet    Refill:  1    Order Specific Question:   Supervising Provider    Answer:   Dorothy Spark [[id number]]    Disposition:  Follow up with Dr. Bronson Ing in 3-4 months.   Signed, Erma Heritage, PA-C  05/16/2018 5:22 PM    [facility]

## 2018-05-21 ENCOUNTER — Telehealth: Payer: Self-pay

## 2018-05-21 NOTE — Telephone Encounter (Signed)
Pt called and said that she had to call 911 yesterday and go to the ER because she had a nose bleed that would not stop.   She said that she is now having issues with bleeding on her legs and other places that she should not be bleeding. Every time she bumps something she said that she till just gush blood.   She called PCP and they told her to contact our office since we prescribed the medication.   Spoke with Dr Scot Dock and advised him of the above information and he said that she has a filter that is working well so she can stop the Xarelto.   Pt notified and will call if she develops any new concerns.   York Cerise, CMA

## 2018-05-23 ENCOUNTER — Telehealth: Payer: Self-pay | Admitting: Cardiovascular Disease

## 2018-05-23 NOTE — Telephone Encounter (Signed)
Refer to recent vascular surgery note for recommendations. No need for Lasix (hasn't helped in past).

## 2018-05-23 NOTE — Telephone Encounter (Signed)
Patient has no c/o SOB, states she didn't take lasix because she has not gained 3 lbs.

## 2018-05-23 NOTE — Telephone Encounter (Signed)
Patient will speak with her HHN, she will wrap her weeping legs and elevate them

## 2018-05-23 NOTE — Telephone Encounter (Signed)
Pt just had her home health nurse there and she's having swelling in her feet and legs, they are weeping, states she has not had a 3lb weight gain. the nurse told her she needs to go back on her furosemide.   Please give pt a call @ [phone]

## 2018-05-26 ENCOUNTER — Telehealth: Payer: Self-pay | Admitting: Cardiovascular Disease

## 2018-05-26 NOTE — Telephone Encounter (Signed)
Pt will speak with pcp

## 2018-05-26 NOTE — Telephone Encounter (Signed)
Asking for prescription for Ondansepron 4 mg tablet  1 tab needed every 8 hours   [facility] on [address]

## 2018-06-03 ENCOUNTER — Ambulatory Visit: Payer: Medicare Other | Admitting: Gastroenterology

## 2018-06-20 NOTE — Progress Notes (Signed)
[facility] Note  06/23/2018     CHIEF COMPLAINT Patient presents for Retina Evaluation   HISTORY OF PRESENT ILLNESS: Regina Hall is a 84 y.o. female who presents to the clinic today for:   HPI    Retina Evaluation    In both eyes.  This started 4 months ago.  Duration of 4 months.  Associated Symptoms Distortion, Blind Spot and Photophobia.  Context:  distance vision, mid-range vision and near vision.  Treatments tried include no treatments.  I, the attending physician,  performed the HPI with the patient and updated documentation appropriately.          Comments    84 y/o female pt referred by Dr. 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  Saw Dr. [name] about 1 mo ago.  Pt began noticing her VA OU getting blurrier about 4 mos ago, and since then has noted constant blurred VA OU, along with distortion, blind spots, intermittent diplopia and photophobia OU.  Denies pain, flashes, floaters.  AT prn OU.       Last edited by [name], MD on 06/23/2018  2:52 PM. (History)    pt states she is a regular pt of Dr. [name], she states her last exam with him was at the beginning of June, she states her right eye vision bothers her more than the left eye, she states when she is reading there are letters missing or they are jumbled on top of each other, she states she has had RK sx several years ago, Dr. [name] did her cataract sx  Referring physician: Vevelyn Royals, MD No address on file  HISTORICAL INFORMATION:   Selected notes from the MEDICAL RECORD NUMBER Referred by Dr. Vevelyn Royals for concern of ARMD LEE: Astrid Drafts) [BCVA: OD: OS:] Ocular Hx-pseudo OU, UL reconstuction (Dr. [name], 02.24.20) PMH-anxiety, arthritis, cancer, HTN, scoliosis   CURRENT MEDICATIONS: Current Outpatient Medications (Ophthalmic Drugs)  Medication Sig  . Propylene Glycol-Glycerin (SOOTHE) 0.6-0.6 % SOLN Place 2 drops into both eyes 4 (four) times daily.  And as needed   No current facility-administered medications for this visit.  (Ophthalmic Drugs)   Current Outpatient Medications (Other)  Medication Sig  . amiodarone (PACERONE) 200 MG tablet TK 1 T PO  BID  . Biotin 1 MG CAPS Take by mouth.  . Cholecalciferol (VITAMIN D3) 5000 units TABS Take 5,000 Units by mouth daily.  . Cranberry 450 MG CAPS Take 1 capsule by mouth daily.   Marland Kitchen docusate sodium (COLACE) 100 MG capsule Take 100 mg by mouth daily.  . furosemide (LASIX) 20 MG tablet Take 20 mg by mouth daily as needed.   . Glucosamine Sulfate 500 MG TABS Take 2 tablets by mouth daily.   Marland Kitchen losartan (COZAAR) 25 MG tablet Take 0.5 tablets (12.5 mg total) by mouth daily.  . magnesium hydroxide (MILK OF MAGNESIA) 400 MG/5ML suspension Take 30 mLs by mouth daily as needed for mild constipation.  . montelukast (SINGULAIR) 10 MG tablet Take 1 tablet (10 mg total) by mouth every morning.  . Multiple Vitamin (MULTIVITAMIN) tablet Take 1 tablet by mouth daily.  . NON FORMULARY Diet Type:  NAS  . Nutritional Supplements (ENSURE ENLIVE PO) Take 1 Bottle by mouth 3 (three) times daily between meals.  . ondansetron (ZOFRAN) 4 MG tablet TK 1 T PO D PRF NAUSEA  . oxyCODONE-acetaminophen (PERCOCET/ROXICET) 5-325 MG tablet TK 1 T  PO D PRF PAIN  . pantoprazole (PROTONIX) 40 MG  tablet Take 1 tablet (40 mg total) by mouth 2 (two) times daily.  . potassium chloride SA (K-DUR,KLOR-CON) 20 MEQ tablet Take 40 mEq by mouth daily.  . psyllium (REGULOID) 0.52 g capsule Take 0.52 g by mouth daily.  . Rivaroxaban 15 & 20 MG TBPK Take as directed on package: Start with one 15mg  tablet by mouth twice a day with food. On Day 22, switch to one 20mg  tablet once a day with food.  . senna-docusate (SENOKOT-S) 8.6-50 MG tablet Take 1 tablet by mouth at bedtime as needed for mild constipation.  . silver sulfADIAZINE (SILVADENE) 1 % cream silver sulfadiazine 1 % topical cream  APP TOPICALLY TO AFFECTED SKIN D UTD  .  triamcinolone cream (KENALOG) 0.1 % APP AA BID PRN  . triamterene-hydrochlorothiazide (DYAZIDE) 37.5-25 MG capsule triamterene 37.5 mg-hydrochlorothiazide 25 mg capsule  TK 1 C PO QD  . vitamin B-12 (CYANOCOBALAMIN) 500 MCG tablet Take 500 mcg by mouth daily.   No current facility-administered medications for this visit.  (Other)      REVIEW OF SYSTEMS: ROS    Positive for: Gastrointestinal, Genitourinary, Musculoskeletal, Cardiovascular, Eyes   Negative for: Constitutional, Neurological, Skin, HENT, Endocrine, Respiratory, Psychiatric, Allergic/Imm, Heme/Lymph   Last edited by Matthew Folks, COA on 06/23/2018  1:33 PM. (History)       ALLERGIES Allergies  Allergen Reactions  . Penicillins Rash and Other (See Comments)    Has patient had a PCN reaction causing immediate rash, facial/tongue/throat swelling, SOB or lightheadedness with hypotension: ###Yes## Has patient had a PCN reaction causing severe rash involving mucus membranes or skin necrosis: No Has patient had a PCN reaction that required hospitalization No Has patient had a PCN reaction occurring within the last 10 years: No If all of the above answers are "NO", then may proceed with Cephalosporin use.   . Vancomycin Shortness Of Breath and Itching    PAST MEDICAL HISTORY Past Medical History:  Diagnosis Date  . Anemia   . Anxiety    pt denies  . Arthritis    KNEES AND BACK  . Bladder incontinence   . Cancer ([place])    basal cell cancer on nose  . Edema of both feet   . Hypertension   . Pneumonia   . PONV (postoperative nausea and vomiting)    PONV X 1 EPISODE, COMES OUT OF ANESTHESIA FAST, SOME AWARENESS DURING END OF COLONSCOPY  . Scoliosis   . VRE (vancomycin resistant enterococcus) culture positive    Past Surgical History:  Procedure Laterality Date  . ABDOMINAL HYSTERECTOMY     2006 VAG HYST  . ABDOMINAL SURGERY    . APPLICATION OF WOUND VAC  03/10/2018   Procedure: APPLICATION OF WOUND VAC;   Surgeon: [name], MD;  Location: [facility];  Service: General;;  . BACK SURGERY     LOWER, X2  . BIOPSY Right 05/02/2017   Procedure: BIOPSY OF RIGHT UPPER EYELID LESION;  Surgeon: [name], MD;  Location: [facility];  Service: Ophthalmology;  Laterality: Right;  . COLECTOMY WITH COLOSTOMY CREATION/HARTMANN PROCEDURE N/A 02/28/2018   Procedure: PARTIAL COLECTOMY WITH COLOSTOMY;  Surgeon: [name], MD;  Location: [facility];  Service: General;  Laterality: N/A;  . EYE SURGERY Bilateral    cataract surgery with lens implant  . FLEXIBLE SIGMOIDOSCOPY N/A 02/21/2018   Procedure: FLEXIBLE SIGMOIDOSCOPY;  Surgeon: [name], MD;  [facility];  Service: Endoscopy;  Laterality: N/A;  . JOINT REPLACEMENT  2011 LT HIP  . LAPAROTOMY N/A 03/10/2018   Procedure: EXPLORATORY LAPAROTOMY;  Surgeon: [name], MD;  Location: [facility];  Service: General;  Laterality: N/A;  . POLYPECTOMY  02/21/2018   Procedure: POLYPECTOMY;  Surgeon: [name], MD;  [facility];  Service: Endoscopy;;  rectum  . RECONSTRUCTION OF EYELID Right 05/02/2017   Procedure: TOTAL RECONSTRUCTION OF UPPER EYELID RIGHT EYE WITH FULL THICKNESS SKIN GRAFT FROM RIGHT POSTERIOR EAR;  Surgeon: [name], MD;  Location: [facility];  Service: Ophthalmology;  Laterality: Right;  . SECONDARY CLOSURE OF WOUND  03/10/2018   Procedure: SECONDARY CLOSURE OF WOUND;  Surgeon: [name], MD;  Location: [facility];  Service: General;;  . TONSILLECTOMY    . TOTAL KNEE ARTHROPLASTY Right 04/27/2014   Procedure: Divide TOTAL KNEE ARTHROPLASTY;  Surgeon: Rod Can, MD;  Location: [facility];  Service: Orthopedics;  Laterality: Right;  . TUBAL LIGATION     1974  . VENA CAVA FILTER PLACEMENT N/A 06/04/2017   Procedure: INSERTION VENA-CAVA FILTER;  Surgeon: Angelia Mould, MD;  Location: [facility];  Service: Vascular;  Laterality: N/A;    FAMILY HISTORY Family History  Problem Relation Age of Onset  .  Congestive Heart Failure Mother   . Colon cancer Father   . Cancer Brother   . Arthritis Sister   . Asthma Sister   . Diabetes Son   . Diabetes Son     SOCIAL HISTORY Social History   Tobacco Use  . Smoking status: Never Smoker  . Smokeless tobacco: Never Used  Substance Use Topics  . Alcohol use: Yes    Comment: OCC WINE  . Drug use: No         OPHTHALMIC EXAM:  Base Eye Exam    Visual Acuity (Snellen - Linear)      Right Left   Dist [place] 20/100 -2 20/100 -2   Dist ph [place] 20/80 +2 20/80 -2  Pt states letters keep disappearing while checking VA OU.       Tonometry (Tonopen, 1:38 PM)      Right Left   Pressure 15 14       Pupils      Dark Light Shape React APD   Right 3 2 Round Brisk None   Left 3 2 Round Brisk None       Visual Fields (Counting fingers)      Left Right     Full   Restrictions Partial inner superior temporal, inferior temporal, superior nasal, inferior nasal deficiencies        Extraocular Movement      Right Left    Full, Ortho Full, Ortho       Neuro/Psych    Oriented x3:  Yes   Mood/Affect:  Normal       Dilation    Both eyes:  1.0% Mydriacyl, 2.5% Phenylephrine @ 1:38 PM        Slit Lamp and Fundus Exam    Slit Lamp Exam      Right Left   Lids/Lashes Dermatochalasis - upper lid, Meibomian gland dysfunction, Telangiectasia Dermatochalasis - upper lid, Meibomian gland dysfunction, Telangiectasia   Conjunctiva/Sclera White and quiet White and quiet   Cornea 8 RK scars with central iron lines, mild Arcus 8 RK scars with central iron lines, mild Arcus   Anterior Chamber Deep and quiet Deep and quiet   Iris Round and dilated Round and dilated   Lens PC IOL in good position  with open PC PC IOL in good position with open PC   Vitreous Vitreous syneresis, Posterior vitreous detachment Vitreous syneresis, Posterior vitreous detachment       Fundus Exam      Right Left   Disc Sharp rim, mild Pallor Compact, mild temporal pallor    C/D Ratio 0.3 0.2   Macula Flat, Blunted foveal reflex, Drusen, shallow SRF, no heme Blunted foveal reflex, Retinal pigment epithelial mottling, Drusen, early central Atrophy, No heme or edema   Vessels Vascular attenuation Vascular attenuation   Periphery Attached, midzonal drusen Attached, miszonal drusen          Refraction    Manifest Refraction   Unable to refract.  Pt unable to see centrally OU.  Letters keep disappearing.          IMAGING AND PROCEDURES  Imaging and Procedures for @TODAY @  OCT, Retina - OU - Both Eyes       Right Eye Quality was good. Central Foveal Thickness: 306. Progression has no prior data. Findings include intraretinal hyper-reflective material, no IRF, abnormal foveal contour, outer retinal atrophy, retinal drusen , pigment epithelial detachment, subretinal hyper-reflective material, subretinal fluid.   Left Eye Quality was good. Central Foveal Thickness: 263. Progression has no prior data. Findings include abnormal foveal contour, intraretinal fluid, outer retinal atrophy, retinal drusen , pigment epithelial detachment, intraretinal hyper-reflective material, no SRF, central retinal atrophy.   Notes *Images captured and stored on drive  Diagnosis / Impression:  OD: exu ARMD  OS: non-exu ARMD   Clinical management:  See below  Abbreviations: NFP - Normal foveal profile. CME - cystoid macular edema. PED - pigment epithelial detachment. IRF - intraretinal fluid. SRF - subretinal fluid. EZ - ellipsoid zone. ERM - epiretinal membrane. ORA - outer retinal atrophy. ORT - outer retinal tubulation. SRHM - subretinal hyper-reflective material        Intravitreal Injection, Pharmacologic Agent - OD - Right Eye       Time Out 06/23/2018. Confirmed correct patient, procedure, site, and patient consented.   Anesthesia Topical anesthesia was used. Anesthetic medications included Lidocaine 2%, Proparacaine 0.5%.   Procedure Preparation included 5%  betadine to ocular surface, eyelid speculum. A supplied needle was used.   Injection:  1.25 mg Bevacizumab (AVASTIN) SOLN   NDC: 67209-470-96, Lot: [id number] , Expiration date: 08/11/2018   Route: Intravitreal, Site: Right Eye, Waste: 0 mL  Post-op Post injection exam found visual acuity of at least counting fingers. The patient tolerated the procedure well. There were no complications. The patient received written and verbal post procedure care education.                 ASSESSMENT/PLAN:    ICD-10-CM   1. Exudative age-related macular degeneration of right eye with active choroidal neovascularization (HCC) H35.3211 Intravitreal Injection, Pharmacologic Agent - OD - Right Eye  2. Retinal edema H35.81 OCT, Retina - OU - Both Eyes  3. 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 Essential hypertension I10   5. Hypertensive retinopathy of both eyes H35.033     1,2. Exudative age related macular degeneration, OD   - The incidence pathology and anatomy of wet AMD discussed   - The ANCHOR, MARINA, CATT and VIEW trials discussed with patient.    - discussed treatment options including observation vs intravitreal anti-VEGF agents such as Avastin, Lucentis, Eylea.    - Risks of endophthalmitis and vascular occlusive events and atrophic changes discussed  with patient  - pt reports decreased vision OD for months -- started while in and out of hospital around January or Feb 2020  - OCT shows central PED with overlying SRHM -- +CNVM  - recommend IVA OD #1 today (6.8.20)  - pt wishes to be treated with IVA  - RBA of procedure discussed, questions answered  - informed consent obtained and signed  - see procedure note  - f/u in 4 wks  3. Age related macular degeneration, non-exudative, OS  - OCT shows central ORA with some mild cystic changes  - pt not particularly bothered by vision OS suggestive of chronicity--much  more bothered by right eye changes  - The incidence, anatomy, and pathology of dry AMD, risk of progression, and the AREDS and AREDS 2 study including smoking risks discussed with patient.  - Recommend amsler grid monitoring  - monitor  4,5. Hypertensive retinopathy OU  - discussed importance of tight BP control  - monitor  6. Pseudophakia OU  - s/p CE/IOL OU (Dr. [name])  - beautiful surgeries, doing well  - monitor  7. History of Radial Keratotomy   - 8 cut RK OU  - central iron lines OU, but cornea stable  - moinitor    Ophthalmic Meds Ordered this visit:  No orders of the defined types were placed in this encounter.      Return in about 4 weeks (around 07/21/2018) for exudative ARMD OD- Dilated Exam, OCT, Possible Injxn.  There are no Patient Instructions on file for this visit.   Explained the diagnoses, plan, and follow up with the patient and they expressed understanding.  Patient expressed understanding of the importance of proper follow up care.   This document serves as a record of services personally performed by Gardiner Sleeper, MD, PhD. It was created on their behalf by Ernest Mallick, OA, an ophthalmic assistant. The creation of this record is the provider's dictation and/or activities during the visit.    Electronically signed by: Ernest Mallick, OA  06.05.2020 4:30 PM    Gardiner Sleeper, M.D., Ph.D. Diseases & Surgery of the Retina and Vitreous [facility]  I have reviewed the above documentation for accuracy and completeness, and I agree with the above. Gardiner Sleeper, M.D., Ph.D. 06/23/18 4:37 PM    Abbreviations: M myopia (nearsighted); A astigmatism; H hyperopia (farsighted); P presbyopia; Mrx spectacle prescription;  CTL contact lenses; OD right eye; OS left eye; OU both eyes  XT exotropia; ET esotropia; PEK punctate epithelial keratitis; PEE punctate epithelial erosions; DES dry eye syndrome; MGD meibomian gland dysfunction;  ATs artificial tears; PFAT's preservative free artificial tears; [place] nuclear sclerotic cataract; PSC posterior subcapsular cataract; ERM epi-retinal membrane; PVD posterior vitreous detachment; RD retinal detachment; DM diabetes mellitus; DR diabetic retinopathy; NPDR non-proliferative diabetic retinopathy; PDR proliferative diabetic retinopathy; CSME clinically significant macular edema; DME diabetic macular edema; dbh dot blot hemorrhages; CWS cotton wool spot; POAG primary open angle glaucoma; C/D cup-to-disc ratio; HVF humphrey visual field; GVF goldmann visual field; OCT optical coherence tomography; IOP intraocular pressure; BRVO Branch retinal vein occlusion; CRVO central retinal vein occlusion; CRAO central retinal artery occlusion; BRAO branch retinal artery occlusion; RT retinal tear; SB scleral buckle; PPV pars plana vitrectomy; VH Vitreous hemorrhage; PRP panretinal laser photocoagulation; IVK intravitreal kenalog; VMT vitreomacular traction; MH Macular hole;  NVD neovascularization of the disc; NVE neovascularization elsewhere; AREDS age related eye disease study; ARMD age related macular degeneration; POAG primary open angle glaucoma;  EBMD epithelial/anterior basement membrane dystrophy; ACIOL anterior chamber intraocular lens; IOL intraocular lens; PCIOL posterior chamber intraocular lens; Phaco/IOL phacoemulsification with intraocular lens placement; [place] photorefractive keratectomy; LASIK laser assisted in situ keratomileusis; HTN hypertension; DM diabetes mellitus; COPD chronic obstructive pulmonary disease

## 2018-06-23 ENCOUNTER — Other Ambulatory Visit: Payer: Self-pay

## 2018-06-23 ENCOUNTER — Encounter (INDEPENDENT_AMBULATORY_CARE_PROVIDER_SITE_OTHER): Payer: Self-pay | Admitting: Ophthalmology

## 2018-06-23 ENCOUNTER — Ambulatory Visit (INDEPENDENT_AMBULATORY_CARE_PROVIDER_SITE_OTHER): Payer: Medicare Other | Admitting: Ophthalmology

## 2018-06-23 DIAGNOSIS — H3581 Retinal edema: Secondary | ICD-10-CM

## 2018-06-23 DIAGNOSIS — Z9889 Other specified postprocedural states: Secondary | ICD-10-CM

## 2018-06-23 DIAGNOSIS — H35033 Hypertensive retinopathy, bilateral: Secondary | ICD-10-CM

## 2018-06-23 DIAGNOSIS — H353124 Nonexudative age-related macular degeneration, left eye, advanced atrophic with subfoveal involvement: Secondary | ICD-10-CM

## 2018-06-23 DIAGNOSIS — H353211 Exudative age-related macular degeneration, right eye, with active choroidal neovascularization: Secondary | ICD-10-CM

## 2018-06-23 DIAGNOSIS — I10 Essential (primary) hypertension: Secondary | ICD-10-CM | POA: Diagnosis not present

## 2018-06-23 DIAGNOSIS — Z961 Presence of intraocular lens: Secondary | ICD-10-CM

## 2018-06-23 MED ORDER — BEVACIZUMAB CHEMO INJECTION 1.25MG/0.05ML SYRINGE FOR KALEIDOSCOPE
1.2500 mg | INTRAVITREAL | Status: AC | PRN
  Administered 2018-06-23: 1.25 mg via INTRAVITREAL

## 2018-07-08 ENCOUNTER — Other Ambulatory Visit: Payer: Self-pay

## 2018-07-08 ENCOUNTER — Emergency Department (HOSPITAL_COMMUNITY)
Admission: EM | Admit: 2018-07-08 | Discharge: 2018-07-08 | Disposition: A | Discharge status: Home / Self Care | Payer: Medicare Other | Attending: Emergency Medicine | Admitting: Emergency Medicine

## 2018-07-08 ENCOUNTER — Emergency Department (HOSPITAL_COMMUNITY): Payer: Medicare Other

## 2018-07-08 ENCOUNTER — Encounter (HOSPITAL_COMMUNITY): Payer: Self-pay

## 2018-07-08 DIAGNOSIS — K5900 Constipation, unspecified: Secondary | ICD-10-CM | POA: Diagnosis not present

## 2018-07-08 DIAGNOSIS — I10 Essential (primary) hypertension: Secondary | ICD-10-CM | POA: Diagnosis not present

## 2018-07-08 DIAGNOSIS — Z79899 Other long term (current) drug therapy: Secondary | ICD-10-CM | POA: Diagnosis not present

## 2018-07-08 DIAGNOSIS — Z933 Colostomy status: Secondary | ICD-10-CM | POA: Diagnosis not present

## 2018-07-08 DIAGNOSIS — Z96642 Presence of left artificial hip joint: Secondary | ICD-10-CM | POA: Diagnosis not present

## 2018-07-08 LAB — CBC WITH DIFFERENTIAL/PLATELET
Abs Immature Granulocytes: 0.01 10*3/uL (ref 0.00–0.07)
Basophils Absolute: 0 10*3/uL (ref 0.0–0.1)
Basophils Relative: 1 %
Eosinophils Absolute: 0.2 10*3/uL (ref 0.0–0.5)
Eosinophils Relative: 4 %
HCT: 38.7 % (ref 36.0–46.0)
Hemoglobin: 12.1 g/dL (ref 12.0–15.0)
Immature Granulocytes: 0 %
Lymphocytes Relative: 35 %
Lymphs Abs: 2 10*3/uL (ref 0.7–4.0)
MCH: 28.5 pg (ref 26.0–34.0)
MCHC: 31.3 g/dL (ref 30.0–36.0)
MCV: 91.3 fL (ref 80.0–100.0)
Monocytes Absolute: 0.7 10*3/uL (ref 0.1–1.0)
Monocytes Relative: 12 %
Neutro Abs: 2.9 10*3/uL (ref 1.7–7.7)
Neutrophils Relative %: 48 %
Platelets: 207 10*3/uL (ref 150–400)
RBC: 4.24 MIL/uL (ref 3.87–5.11)
RDW: 14.2 % (ref 11.5–15.5)
WBC: 5.8 10*3/uL (ref 4.0–10.5)
nRBC: 0 % (ref 0.0–0.2)

## 2018-07-08 LAB — COMPREHENSIVE METABOLIC PANEL
ALT: 16 U/L (ref 0–44)
AST: 17 U/L (ref 15–41)
Albumin: 3.8 g/dL (ref 3.5–5.0)
Alkaline Phosphatase: 53 U/L (ref 38–126)
Anion gap: 10 (ref 5–15)
BUN: 24 mg/dL — ABNORMAL HIGH (ref 8–23)
CO2: 27 mmol/L (ref 22–32)
Calcium: 9.8 mg/dL (ref 8.9–10.3)
Chloride: 102 mmol/L (ref 98–111)
Creatinine, Ser: 1.35 mg/dL — ABNORMAL HIGH (ref 0.44–1.00)
GFR calc Af Amer: 43 mL/min — ABNORMAL LOW (ref >60)
GFR calc non Af Amer: 37 mL/min — ABNORMAL LOW (ref >60)
Glucose, Bld: 96 mg/dL (ref 70–99)
Potassium: 3.3 mmol/L — ABNORMAL LOW (ref 3.5–5.1)
Sodium: 139 mmol/L (ref 135–145)
Total Bilirubin: 0.9 mg/dL (ref 0.3–1.2)
Total Protein: 6.6 g/dL (ref 6.5–8.1)

## 2018-07-08 LAB — LIPASE, BLOOD: Lipase: 33 U/L (ref 11–51)

## 2018-07-08 MED ORDER — IOHEXOL 300 MG/ML  SOLN
75.0000 mL | Freq: Once | INTRAMUSCULAR | Status: AC | PRN
  Administered 2018-07-08: 18:00:00 75 mL via INTRAVENOUS

## 2018-07-08 MED ORDER — SODIUM CHLORIDE 0.9 % IV SOLN
INTRAVENOUS | Status: DC
  Administered 2018-07-08: 15:00:00 via INTRAVENOUS

## 2018-07-08 NOTE — ED Notes (Signed)
Patient transported to CT 

## 2018-07-08 NOTE — ED Triage Notes (Signed)
Pt states her colostomy bag is not working. Last time this happened she was given Mag Citrate and that helped. States she was sent here for an abdominal CT. Has not had any output from colostomy bag in 4 days.

## 2018-07-08 NOTE — Discharge Instructions (Addendum)
Use the mag citrate again if you do not have a stool by this weekend.  Follow-up with your doctor next week

## 2018-07-08 NOTE — ED Notes (Signed)
Pt. Back from CT.

## 2018-07-08 NOTE — ED Provider Notes (Signed)
[facility] EMERGENCY DEPARTMENT Provider Note   CSN: [id number] Arrival date & time: 07/08/18  1203     History   Chief Complaint Chief Complaint  Patient presents with  . Constipation    HPI Regina Hall is a 84 y.o. female.     Patient presents for concerns for constipation associated with her colostomy.  Patient had a colostomy placed due to bowel perforation done by Dr. Arnoldo Morale.  Patient on Sunday was prescribed some mag citrate.  Which did open her up and she had good bowel movements.  But then it has stopped since then.  Patient has home nursing.  Patient was referred in for abdominal CT and evaluation.  Patient denies any persistent vomiting.  Denies any fevers or upper respiratory symptoms.     Past Medical History:  Diagnosis Date  . Anemia   . Anxiety    pt denies  . Arthritis    KNEES AND BACK  . Bladder incontinence   . Cancer (HCC)    basal cell cancer on nose  . Edema of both feet   . Hypertension   . Pneumonia   . PONV (postoperative nausea and vomiting)    PONV X 1 EPISODE, COMES OUT OF ANESTHESIA FAST, SOME AWARENESS DURING END OF COLONSCOPY  . Scoliosis   . VRE (vancomycin resistant enterococcus) culture positive     Patient Active Problem List   Diagnosis Date Noted  . 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 Presence of IVC filter 04/11/2018  . Chronic kidney disease, stage 3 (moderate) (HCC) 03/14/2018  . New onset atrial fibrillation (HCC) 03/14/2018  . Chronic non-seasonal allergic rhinitis 03/14/2018  . GERD without esophagitis 03/14/2018  . Severe protein-calorie malnutrition (HCC) 03/14/2018  . Venous stasis of both lower extremities 03/14/2018  . Colostomy status (HCC) 03/14/2018  . Wound dehiscence, surgical   . Pulmonary edema   . Bronchospasm   . AKI (acute kidney injury) (HCC)   . Diverticulitis of large intestine with perforation   . Fecal peritonitis (HCC)   . Fever 02/26/2018  . Abnormal CT scan, sigmoid colon   .  Diverticulitis of large intestine without perforation or abscess without bleeding   . Large bowel obstruction (HCC)   . Chronic constipation   . Mass of colon 02/20/2018  . DDD (degenerative disc disease), lumbosacral 12/09/2017  . Impaired fasting glucose 12/09/2017  . DVT (deep venous thrombosis) (HCC) 06/04/2017  . Hematoma of right thigh 06/04/2017  . Essential hypertension 06/04/2017  . Acute blood loss anemia 06/04/2017  . Chronic conjunctivitis 10/22/2016  . Chronic back pain 08/21/2016  . Scoliosis of lumbar spine 03/28/2015  . Primary osteoarthritis of right knee 04/27/2014  . Hip pain 02/17/2013  . History of total hip arthroplasty 09/09/2012    Past Surgical History:  Procedure Laterality Date  . ABDOMINAL HYSTERECTOMY     20 06 VAG HYST  . ABDOMINAL SURGERY    . APPLICATION OF WOUND VAC  03/10/2018   Procedure: APPLICATION OF WOUND VAC;  Surgeon: [name], MD;  Location: [facility];  Service: General;;  . BACK SURGERY     LOWER, X2  . BIOPSY Right 05/02/2017   Procedure: BIOPSY OF RIGHT UPPER EYELID LESION;  Surgeon: [name], MD;  Location: [place];  Service: Ophthalmology;  Laterality: Right;  . COLECTOMY WITH COLOSTOMY CREATION/HARTMANN PROCEDURE N/A 02/28/2018   Procedure: PARTIAL COLECTOMY WITH COLOSTOMY;  Surgeon: [name], MD;  Location: [facility];  Service: General;  Laterality: N/A;  .  EYE SURGERY Bilateral    cataract surgery with lens implant  . FLEXIBLE SIGMOIDOSCOPY N/A 02/21/2018   Procedure: FLEXIBLE SIGMOIDOSCOPY;  Surgeon: [name], MD;  Location: [facility];  Service: Endoscopy;  Laterality: N/A;  . JOINT REPLACEMENT     2011 LT HIP  . LAPAROTOMY N/A 03/10/2018   Procedure: EXPLORATORY LAPAROTOMY;  Surgeon: [name], MD;  Location: [facility];  Service: General;  Laterality: N/A;  . POLYPECTOMY  02/21/2018   Procedure: POLYPECTOMY;  Surgeon: [name], MD;  [facility]: [facility];  Service: Endoscopy;;  rectum  .  RECONSTRUCTION OF EYELID Right 05/02/2017   Procedure: TOTAL RECONSTRUCTION OF UPPER EYELID RIGHT EYE WITH FULL THICKNESS SKIN GRAFT FROM RIGHT POSTERIOR EAR;  Surgeon: [name], MD;  Location: [place];  Service: Ophthalmology;  Laterality: Right;  . SECONDARY CLOSURE OF WOUND  03/10/2018   Procedure: SECONDARY CLOSURE OF WOUND;  Surgeon: [name], MD;  Location: [facility];  Service: General;;  . TONSILLECTOMY    . TOTAL KNEE ARTHROPLASTY Right 04/27/2014   Procedure: [place] TOTAL KNEE ARTHROPLASTY;  Surgeon: Rod Can, MD;  Location: [facility];  Service: Orthopedics;  Laterality: Right;  . TUBAL LIGATION     1974  . VENA CAVA FILTER PLACEMENT N/A 06/04/2017   Procedure: INSERTION VENA-CAVA FILTER;  Surgeon: Angelia Mould, MD;  Location: [facility];  Service: Vascular;  Laterality: N/A;     OB History   No obstetric history on file.      Home Medications    Prior to Admission medications   Medication Sig Start Date End Date Taking? Authorizing Provider  Cholecalciferol (VITAMIN D3) 5000 units TABS Take 5,000 Units by mouth daily.   Yes [provider]  Cranberry 450 MG CAPS Take 1 capsule by mouth daily.  03/18/18  Yes [provider]  docusate sodium (COLACE) 100 MG capsule Take 100 mg by mouth daily. 04/04/18  Yes [provider]  losartan (COZAAR) 25 MG tablet Take 0.5 tablets (12.5 mg total) by mouth daily. 05/16/18  Yes Strader, [place] M, PA-C  montelukast (SINGULAIR) 10 MG tablet Take 1 tablet (10 mg total) by mouth every morning. 05/02/18  Yes Ronnie Doss M, DO  Multiple Vitamin (MULTIVITAMIN) tablet Take 1 tablet by mouth daily. 03/13/18  Yes [provider]  NON FORMULARY Diet Type:  NAS 03/13/18  Yes [provider]  Nutritional Supplements (ENSURE ENLIVE PO) Take 1 Bottle by mouth 3 (three) times daily between meals. 04/03/18  Yes [provider]  Propylene Glycol-Glycerin (SOOTHE) 0.6-0.6 % SOLN Place 2 drops  into both eyes 4 (four) times daily. And as needed 03/18/18  Yes [provider]  psyllium (REGULOID) 0.52 g capsule Take 0.52 g by mouth daily. 04/12/18  Yes [provider]  senna-docusate (SENOKOT-S) 8.6-50 MG tablet Take 1 tablet by mouth at bedtime as needed for mild constipation. 04/24/18  Yes Long, Wonda Olds, MD  silver sulfADIAZINE (SILVADENE) 1 % cream silver sulfadiazine 1 % topical cream  APP TOPICALLY TO AFFECTED SKIN D UTD   Yes [provider]  triamcinolone cream (KENALOG) 0.1 % APP AA BID PRN 05/28/18  Yes [provider]  vitamin B-12 (CYANOCOBALAMIN) 500 MCG tablet Take 500 mcg by mouth daily.   Yes [provider]  amiodarone (PACERONE) 200 MG tablet TK 1 T PO  BID 05/16/18   [provider]  Biotin 1 MG CAPS Take by mouth.    [provider]  furosemide (LASIX) 20 MG tablet Take 20 mg by mouth daily as needed.  03/20/18   [provider]  Glucosamine Sulfate 500 MG TABS Take 2 tablets by mouth daily.  03/13/18   [provider]  magnesium hydroxide (MILK OF MAGNESIA) 400 MG/5ML suspension Take 30 mLs by mouth daily as needed for mild constipation. 04/11/18   [provider]  ondansetron (ZOFRAN) 4 MG tablet TK 1 T PO D PRF NAUSEA 05/28/18   [provider]  oxyCODONE-acetaminophen (PERCOCET/ROXICET) 5-325 MG tablet TK 1 T  PO D PRF PAIN 05/21/18   [provider]  pantoprazole (PROTONIX) 40 MG tablet Take 1 tablet (40 mg total) by mouth 2 (two) times daily. Patient not taking: Reported on 07/08/2018 05/09/18   [name], DO  potassium chloride SA (K-DUR,KLOR-CON) 20 MEQ tablet Take 40 mEq by mouth daily. 03/13/18   [provider]  Rivaroxaban 15 & 20 MG TBPK Take as directed on package: Start with one 15mg  tablet by mouth twice a day with food. On Day 22, switch to one 20mg  tablet once a day with food. Patient not taking: Reported on 07/08/2018 05/14/18   Angelia Mould, MD  triamterene-hydrochlorothiazide (DYAZIDE) 37.5-25 MG capsule triamterene 37.5 mg-hydrochlorothiazide 25 mg capsule  TK 1 C PO QD    [provider]    Family History Family History  Problem Relation Age of Onset  . Congestive Heart Failure Mother   . Colon cancer Father   . Cancer Brother   . Arthritis Sister   . Asthma Sister   . Diabetes Son   . Diabetes Son     Social History Social History   Tobacco Use  . Smoking status: Never Smoker  . Smokeless tobacco: Never Used  Substance Use Topics  . Alcohol use: Yes    Comment: OCC WINE  . Drug use: No     Allergies   Penicillins and Vancomycin   Review of Systems Review of Systems  Constitutional: Negative for chills and fever.  HENT: Negative for congestion, rhinorrhea and sore throat.   Eyes: Negative for visual disturbance.  Respiratory: Negative for cough and shortness of breath.   Cardiovascular: Negative for chest pain and leg swelling.  Gastrointestinal: Positive for constipation. Negative for abdominal pain, diarrhea, nausea and vomiting.  Genitourinary: Negative for dysuria.  Musculoskeletal: Negative for back pain and neck pain.  Skin: Negative for rash.  Neurological: Negative for dizziness, light-headedness and headaches.  Hematological: Does not bruise/bleed easily.  Psychiatric/Behavioral: Negative for confusion.     Physical Exam Updated Vital Signs BP (!) 170/81 (BP Location: Right Arm)   Pulse 65   Temp 98.3 F (36.8 C) (Oral)   Resp 20   Ht 1.676 m (5\' 6" )   Wt 88.9 kg   SpO2 96%   BMI 31.64 kg/m   Physical Exam Vitals signs and nursing note reviewed.  Constitutional:      General: She is not in acute distress.    Appearance: Normal appearance. She is well-developed.  HENT:     Head: Normocephalic and atraumatic.  Eyes:     Extraocular Movements: Extraocular movements intact.     Conjunctiva/sclera: Conjunctivae normal.     Pupils: Pupils are equal,  round, and reactive to light.  Neck:     Musculoskeletal: Normal range of motion and neck supple.  Cardiovascular:     Rate and Rhythm: Normal rate and regular rhythm.     Heart sounds: No murmur.  Pulmonary:     Effort: Pulmonary effort is normal. No respiratory distress.     Breath sounds: Normal breath sounds.  Abdominal:     General: There is distension.     Palpations: Abdomen is soft.     Tenderness: There is no abdominal tenderness.     Comments: Patient with a colostomy on left side of abdomen.  Colostomy appears healthy.  Has some liquid stool.  No formed stool.  Abdomen without any significant tenderness.  May be slightly distended.  Musculoskeletal: Normal range of motion.  Skin:    General: Skin is warm and dry.  Neurological:     General: No focal deficit present.     Mental Status: She is alert and oriented to person, place, and time.      ED Treatments / Results  Labs (all labs ordered are listed, but only abnormal results are displayed) Labs Reviewed  COMPREHENSIVE METABOLIC PANEL  LIPASE, BLOOD  CBC WITH DIFFERENTIAL/PLATELET    EKG    Radiology No results found.  Procedures Procedures (including critical care time)  Medications Ordered in ED Medications  0.9 %  sodium chloride infusion (has no administration in time range)     Initial Impression / Assessment and Plan / ED Course  I have reviewed the triage vital signs and the nursing notes.  Pertinent labs & imaging results that were available during my care of the patient were reviewed by me and considered in my medical decision making (see chart for details).        Will get abdominal labs and do CT abdomen with contrast to evaluate.  Patient is followed by Dr. Arnoldo Morale and he did her colostomy.  Patient nontoxic no acute distress.  Disposition will be as per CT findings.  Final Clinical Impressions(s) / ED Diagnoses   Final diagnoses:  Constipation, unspecified constipation type     ED Discharge Orders    None       Fredia Sorrow, MD 07/08/18 1458

## 2018-07-11 ENCOUNTER — Encounter (INDEPENDENT_AMBULATORY_CARE_PROVIDER_SITE_OTHER): Payer: Self-pay | Admitting: Ophthalmology

## 2018-07-21 NOTE — Progress Notes (Signed)
[facility] Note  07/22/2018     CHIEF COMPLAINT Patient presents for Retina Follow Up   HISTORY OF PRESENT ILLNESS: Regina Hall is a 84 y.o. female who presents to the clinic today for:   HPI    Retina Follow Up    Patient presents with  Wet AMD.  In both eyes.  This started 4 weeks ago.  Severity is moderate.  I, the attending physician,  performed the HPI with the patient and updated documentation appropriately.          Comments    Patient here for 4 weeks retina follow up for exu ARMD. Patient states vision not good. No eye pain. Has dry eyes.       Last edited by [name], MD on 07/22/2018  1:26 PM. (History)    pt states she feels like her vision has improved a little bit since last visit  Referring physician: [name], DO [place],  [place] [id number]  HISTORICAL INFORMATION:   Selected notes from the MEDICAL RECORD NUMBER Referred by Dr. Vevelyn Royals for concern of ARMD LEE: Regina Hall) [BCVA: OD: OS:] Ocular Hx-pseudo OU, UL reconstuction (Dr. [name], 02.24.20) PMH-anxiety, arthritis, cancer, HTN, scoliosis   CURRENT MEDICATIONS: Current Outpatient Medications (Ophthalmic Drugs)  Medication Sig  . Propylene Glycol-Glycerin (SOOTHE) 0.6-0.6 % SOLN Place 2 drops into both eyes 4 (four) times daily. And as needed   No current facility-administered medications for this visit.  (Ophthalmic Drugs)   Current Outpatient Medications (Other)  Medication Sig  . amiodarone (PACERONE) 200 MG tablet TK 1 T Hall  BID  . Biotin 1 MG CAPS Take by mouth.  . Cholecalciferol (VITAMIN D3) 5000 units TABS Take 5,000 Units by mouth daily.  . Cranberry 450 MG CAPS Take 1 capsule by mouth daily.   Marland Kitchen docusate sodium (COLACE) 100 MG capsule Take 100 mg by mouth daily.  . furosemide (LASIX) 20 MG tablet Take 20 mg by mouth daily as needed.   . Glucosamine Sulfate 500 MG TABS Take 2 tablets by mouth daily.   Marland Kitchen losartan  (COZAAR) 25 MG tablet Take 0.5 tablets (12.5 mg total) by mouth daily.  . magnesium hydroxide (MILK OF MAGNESIA) 400 MG/5ML suspension Take 30 mLs by mouth daily as needed for mild constipation.  . montelukast (SINGULAIR) 10 MG tablet Take 1 tablet (10 mg total) by mouth every morning.  . Multiple Vitamin (MULTIVITAMIN) tablet Take 1 tablet by mouth daily.  . NON FORMULARY Diet Type:  NAS  . Nutritional Supplements (ENSURE ENLIVE Hall) Take 1 Bottle by mouth 3 (three) times daily between meals.  . ondansetron (ZOFRAN) 4 MG tablet TK 1 T Hall D PRF NAUSEA  . oxyCODONE-acetaminophen (PERCOCET/ROXICET) 5-325 MG tablet TK 1 T  Hall D PRF PAIN  . pantoprazole (PROTONIX) 40 MG tablet Take 1 tablet (40 mg total) by mouth 2 (two) times daily. (Patient not taking: Reported on 07/08/2018)  . potassium chloride SA (K-DUR,KLOR-CON) 20 MEQ tablet Take 40 mEq by mouth daily.  . psyllium (REGULOID) 0.52 g capsule Take 0.52 g by mouth daily.  . Rivaroxaban 15 & 20 MG TBPK Take as directed on package: Start with one 15mg  tablet by mouth twice a day with food. On Day 22, switch to one 20mg  tablet once a day with food. (Patient not taking: Reported on 07/08/2018)  . senna-docusate (SENOKOT-S) 8.6-50 MG tablet Take 1 tablet by mouth at bedtime as needed  for mild constipation.  . silver sulfADIAZINE (SILVADENE) 1 % cream silver sulfadiazine 1 % topical cream  APP TOPICALLY TO AFFECTED SKIN D UTD  . triamcinolone cream (KENALOG) 0.1 % APP AA BID PRN  . triamterene-hydrochlorothiazide (DYAZIDE) 37.5-25 MG capsule triamterene 37.5 mg-hydrochlorothiazide 25 mg capsule  TK 1 C Hall QD  . vitamin B-12 (CYANOCOBALAMIN) 500 MCG tablet Take 500 mcg by mouth daily.   No current facility-administered medications for this visit.  (Other)      REVIEW OF SYSTEMS: ROS    Positive for: Gastrointestinal, Genitourinary, Musculoskeletal, Cardiovascular, Eyes   Negative for: Constitutional, Neurological, Skin, HENT, Endocrine,  Respiratory, Psychiatric, Allergic/Imm, Heme/Lymph   Last edited by [name] on 07/22/2018  1:12 PM. (History)       ALLERGIES Allergies  Allergen Reactions  . Penicillins Rash and Other (See Comments)    Has patient had a PCN reaction causing immediate rash, facial/tongue/throat swelling, SOB or lightheadedness with hypotension: ###Yes## Has patient had a PCN reaction causing severe rash involving mucus membranes or skin necrosis: No Has patient had a PCN reaction that required hospitalization No Has patient had a PCN reaction occurring within the last 10 years: No If all of the above answers are "NO", then may proceed with Cephalosporin use.   . Vancomycin Shortness Of Breath and Itching    PAST MEDICAL HISTORY Past Medical History:  Diagnosis Date  . Anemia   . Anxiety    pt denies  . Arthritis    KNEES AND BACK  . Bladder incontinence   . Cancer ([place])    basal cell cancer on nose  . Edema of both feet   . Hypertension   . Pneumonia   . PONV (postoperative nausea and vomiting)    PONV X 1 EPISODE, COMES OUT OF ANESTHESIA FAST, SOME AWARENESS DURING END OF COLONSCOPY  . Scoliosis   . VRE (vancomycin resistant enterococcus) culture positive    Past Surgical History:  Procedure Laterality Date  . ABDOMINAL HYSTERECTOMY     2006 VAG HYST  . ABDOMINAL SURGERY    . APPLICATION OF WOUND VAC  03/10/2018   Procedure: APPLICATION OF WOUND VAC;  Surgeon: [name], MD;  Location: [facility];  Service: General;;  . BACK SURGERY     LOWER, X2  . BIOPSY Right 05/02/2017   Procedure: BIOPSY OF RIGHT UPPER EYELID LESION;  Surgeon: [name], MD;  Location: [facility];  Service: Ophthalmology;  Laterality: Right;  . COLECTOMY WITH COLOSTOMY CREATION/HARTMANN PROCEDURE N/A 02/28/2018   Procedure: PARTIAL COLECTOMY WITH COLOSTOMY;  Surgeon: [name], MD;  Location: [facility];  Service: General;  Laterality: N/A;  . EYE SURGERY Bilateral    cataract surgery with lens  implant  . FLEXIBLE SIGMOIDOSCOPY N/A 02/21/2018   Procedure: FLEXIBLE SIGMOIDOSCOPY;  Surgeon: [name], MD;  [facility];  Service: Endoscopy;  Laterality: N/A;  . JOINT REPLACEMENT     2011 LT HIP  . LAPAROTOMY N/A 03/10/2018   Procedure: EXPLORATORY LAPAROTOMY;  Surgeon: [name], MD;  Location: [facility];  Service: General;  Laterality: N/A;  . POLYPECTOMY  02/21/2018   Procedure: POLYPECTOMY;  Surgeon: [name], MD;  [facility];  Service: Endoscopy;;  rectum  . RECONSTRUCTION OF EYELID Right 05/02/2017   Procedure: TOTAL RECONSTRUCTION OF UPPER EYELID RIGHT EYE WITH FULL THICKNESS SKIN GRAFT FROM RIGHT POSTERIOR EAR;  Surgeon: [name], MD;  Location: [facility];  Service: Ophthalmology;  Laterality: Right;  .  SECONDARY CLOSURE OF WOUND  03/10/2018   Procedure: SECONDARY CLOSURE OF WOUND;  Surgeon: [name], MD;  Location: [facility];  Service: General;;  . TONSILLECTOMY    . TOTAL KNEE ARTHROPLASTY Right 04/27/2014   Procedure: [place] TOTAL KNEE ARTHROPLASTY;  Surgeon: Rod Can, MD;  Location: [facility];  Service: Orthopedics;  Laterality: Right;  . TUBAL LIGATION     1974  . VENA CAVA FILTER PLACEMENT N/A 06/04/2017   Procedure: INSERTION VENA-CAVA FILTER;  Surgeon: Angelia Mould, MD;  Location: [facility];  Service: Vascular;  Laterality: N/A;    FAMILY HISTORY Family History  Problem Relation Age of Onset  . Congestive Heart Failure Mother   . Colon cancer Father   . Cancer Brother   . Arthritis Sister   . Asthma Sister   . Diabetes Son   . Diabetes Son     SOCIAL HISTORY Social History   Tobacco Use  . Smoking status: Never Smoker  . Smokeless tobacco: Never Used  Substance Use Topics  . Alcohol use: Yes    Comment: OCC WINE  . Drug use: No         OPHTHALMIC EXAM:  Base Eye Exam    Visual Acuity (Snellen - Linear)      Right Left   Dist [place] 20/100 20/100 -2   Dist ph [place] 20/60 -1 20/60 -2       Tonometry  (Tonopen, 1:08 PM)      Right Left   Pressure 10 07       Pupils      Dark Light Shape React APD   Right 3 2 Round Brisk None   Left 3 2 Round Brisk None       Visual Fields (Counting fingers)      Left Right     Full   Restrictions Partial inner superior temporal, inferior temporal, superior nasal, inferior nasal deficiencies        Extraocular Movement      Right Left    Full, Ortho Full, Ortho       Neuro/Psych    Oriented x3: Yes   Mood/Affect: Normal       Dilation    Both eyes: 1.0% Mydriacyl, 2.5% Phenylephrine @ 1:08 PM        Slit Lamp and Fundus Exam    Slit Lamp Exam      Right Left   Lids/Lashes Dermatochalasis - upper lid, Meibomian gland dysfunction, Telangiectasia Dermatochalasis - upper lid, Meibomian gland dysfunction, Telangiectasia   Conjunctiva/Sclera White and quiet White and quiet   Cornea 8 RK scars with central iron lines, mild Arcus 8 RK scars with central iron lines, mild Arcus   Anterior Chamber Deep and quiet Deep and quiet   Iris Round and dilated Round and dilated   Lens PC IOL in good position with open PC PC IOL in good position with open PC   Vitreous Vitreous syneresis, Posterior vitreous detachment Vitreous syneresis, Posterior vitreous detachment       Fundus Exam      Right Left   Disc Sharp rim, mild Pallor Compact, mild temporal pallor   C/D Ratio 0.3 0.2   Macula Flat, Blunted foveal reflex, Drusen, shallow SRF, no heme - stable from prior, not worse Blunted foveal reflex, Retinal pigment epithelial mottling, Drusen, early central Atrophy, No heme or edema   Vessels Vascular attenuation Vascular attenuation   Periphery Attached, midzonal drusen Attached, midzonal drusen  IMAGING AND PROCEDURES  Imaging and Procedures for @TODAY @  OCT, Retina - OU - Both Eyes       Right Eye Quality was good. Central Foveal Thickness: 303. Progression has been stable. Findings include intraretinal hyper-reflective  material, no IRF, abnormal foveal contour, outer retinal atrophy, retinal drusen , pigment epithelial detachment, subretinal hyper-reflective material, subretinal fluid (Persistent SRF).   Left Eye Quality was good. Central Foveal Thickness: 264. Progression has been stable. Findings include abnormal foveal contour, intraretinal fluid, outer retinal atrophy, retinal drusen , pigment epithelial detachment, intraretinal hyper-reflective material, no SRF, central retinal atrophy.   Notes *Images captured and stored on drive  Diagnosis / Impression:  OD: exu ARMD -- no significant change from prior OS: non-exu ARMD with early central ORA   Clinical management:  See below  Abbreviations: NFP - Normal foveal profile. CME - cystoid macular edema. PED - pigment epithelial detachment. IRF - intraretinal fluid. SRF - subretinal fluid. EZ - ellipsoid zone. ERM - epiretinal membrane. ORA - outer retinal atrophy. ORT - outer retinal tubulation. SRHM - subretinal hyper-reflective material        Intravitreal Injection, Pharmacologic Agent - OD - Right Eye       Time Out 07/22/2018. 2:02 PM. Confirmed correct patient, procedure, site, and patient consented.   Anesthesia Topical anesthesia was used. Anesthetic medications included Lidocaine 2%, Proparacaine 0.5%.   Procedure Preparation included 5% betadine to ocular surface, eyelid speculum. A supplied needle was used.   Injection:  1.25 mg Bevacizumab (AVASTIN) SOLN   NDC: 02585-277-82, Lot: [id number] , Expiration date: 09/24/2018   Route: Intravitreal, Site: Right Eye, Waste: 0 mL  Post-op Post injection exam found visual acuity of at least counting fingers. The patient tolerated the procedure well. There were no complications. The patient received written and verbal post procedure care education.                 ASSESSMENT/PLAN:    ICD-10-CM   1. Exudative age-related macular degeneration of right eye with active choroidal  neovascularization (HCC)  H35.3211 Intravitreal Injection, Pharmacologic Agent - OD - Right Eye    Bevacizumab (AVASTIN) SOLN 1.25 mg  2. Retinal edema  H35.81 OCT, Retina - OU - Both Eyes  3. 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 Essential hypertension  I10   5. Hypertensive retinopathy of both eyes  H35.033   6. Pseudophakia of both eyes  Z96.1   7. History of radial keratotomy  Z98.890     1,2. Exudative age related macular degeneration, OD   - at presentation 06/23/2018 pt reported decreased vision OD for months -- started while in and out of hospital around January or Feb 2020  - s/p IVA OD #1 (06.08.20)  - OCT shows persistent SRF, essentially unchanged  - BCVA improved to 20/60 from 20/80  - recommend IVA OD #2 today (07.07.20)  - pt wishes to be treated with IVA  - RBA of procedure discussed, questions answered  - informed consent obtained and signed  - see procedure note  - f/u in 4 wks  3. Age related macular degeneration, non-exudative, OS  - OCT shows central ORA with some mild cystic changes  - pt not particularly bothered by vision OS suggestive of chronicity--much more bothered by right eye changes  - The incidence, anatomy, and pathology of dry AMD, risk of progression, and the AREDS and AREDS 2 study including smoking risks discussed with patient.  -  Recommend amsler grid monitoring  - monitor  4,5. Hypertensive retinopathy OU  - discussed importance of tight BP control  - monitor  6. Pseudophakia OU  - s/p CE/IOL OU (Dr. [name])  - beautiful surgeries, doing well  - monitor  7. History of Radial Keratotomy   - 8 cut RK OU  - central iron lines OU, but cornea stable  - moinitor    Ophthalmic Meds Ordered this visit:  Meds ordered this encounter  Medications  . Bevacizumab (AVASTIN) SOLN 1.25 mg       Return in about 4 weeks (around 08/19/2018) for f/u exu ARMD OD,  DFE, OCT.  There are no Patient Instructions on file for this visit.   Explained the diagnoses, plan, and follow up with the patient and they expressed understanding.  Patient expressed understanding of the importance of proper follow up care.   This document serves as a record of services personally performed by Gardiner Sleeper, MD, PhD. It was created on their behalf by Ernest Mallick, OA, an ophthalmic assistant. The creation of this record is the provider's dictation and/or activities during the visit.    Electronically signed by: Ernest Mallick, OA  07.06.2020 10:53 PM    Gardiner Sleeper, M.D., Ph.D. Diseases & Surgery of the Retina and Vitreous [facility]  I have reviewed the above documentation for accuracy and completeness, and I agree with the above. Gardiner Sleeper, M.D., Ph.D. 07/22/18 10:55 PM    Abbreviations: M myopia (nearsighted); A astigmatism; H hyperopia (farsighted); P presbyopia; Mrx spectacle prescription;  CTL contact lenses; OD right eye; OS left eye; OU both eyes  XT exotropia; ET esotropia; PEK punctate epithelial keratitis; PEE punctate epithelial erosions; DES dry eye syndrome; MGD meibomian gland dysfunction; ATs artificial tears; PFAT's preservative free artificial tears; [place] nuclear sclerotic cataract; PSC posterior subcapsular cataract; ERM epi-retinal membrane; PVD posterior vitreous detachment; RD retinal detachment; DM diabetes mellitus; DR diabetic retinopathy; NPDR non-proliferative diabetic retinopathy; PDR proliferative diabetic retinopathy; CSME clinically significant macular edema; DME diabetic macular edema; dbh dot blot hemorrhages; CWS cotton wool spot; POAG primary open angle glaucoma; C/D cup-to-disc ratio; HVF humphrey visual field; GVF goldmann visual field; OCT optical coherence tomography; IOP intraocular pressure; BRVO Branch retinal vein occlusion; CRVO central retinal vein occlusion; CRAO central retinal artery occlusion;  BRAO branch retinal artery occlusion; RT retinal tear; SB scleral buckle; PPV pars plana vitrectomy; VH Vitreous hemorrhage; PRP panretinal laser photocoagulation; IVK intravitreal kenalog; VMT vitreomacular traction; MH Macular hole;  NVD neovascularization of the disc; NVE neovascularization elsewhere; AREDS age related eye disease study; ARMD age related macular degeneration; POAG primary open angle glaucoma; EBMD epithelial/anterior basement membrane dystrophy; ACIOL anterior chamber intraocular lens; IOL intraocular lens; PCIOL posterior chamber intraocular lens; Phaco/IOL phacoemulsification with intraocular lens placement; [place] photorefractive keratectomy; LASIK laser assisted in situ keratomileusis; HTN hypertension; DM diabetes mellitus; COPD chronic obstructive pulmonary disease

## 2018-07-22 ENCOUNTER — Ambulatory Visit (INDEPENDENT_AMBULATORY_CARE_PROVIDER_SITE_OTHER): Payer: Medicare Other | Admitting: Ophthalmology

## 2018-07-22 ENCOUNTER — Encounter (INDEPENDENT_AMBULATORY_CARE_PROVIDER_SITE_OTHER): Payer: Self-pay | Admitting: Ophthalmology

## 2018-07-22 ENCOUNTER — Other Ambulatory Visit: Payer: Self-pay

## 2018-07-22 DIAGNOSIS — Z9889 Other specified postprocedural states: Secondary | ICD-10-CM

## 2018-07-22 DIAGNOSIS — H353124 Nonexudative age-related macular degeneration, left eye, advanced atrophic with subfoveal involvement: Secondary | ICD-10-CM

## 2018-07-22 DIAGNOSIS — H353211 Exudative age-related macular degeneration, right eye, with active choroidal neovascularization: Secondary | ICD-10-CM | POA: Diagnosis not present

## 2018-07-22 DIAGNOSIS — H3581 Retinal edema: Secondary | ICD-10-CM

## 2018-07-22 DIAGNOSIS — I10 Essential (primary) hypertension: Secondary | ICD-10-CM | POA: Diagnosis not present

## 2018-07-22 DIAGNOSIS — H35033 Hypertensive retinopathy, bilateral: Secondary | ICD-10-CM

## 2018-07-22 DIAGNOSIS — Z961 Presence of intraocular lens: Secondary | ICD-10-CM

## 2018-07-22 MED ORDER — BEVACIZUMAB CHEMO INJECTION 1.25MG/0.05ML SYRINGE FOR KALEIDOSCOPE
1.2500 mg | INTRAVITREAL | Status: AC | PRN
  Administered 2018-07-22: 1.25 mg via INTRAVITREAL

## 2018-07-29 ENCOUNTER — Telehealth: Payer: Self-pay

## 2018-07-29 ENCOUNTER — Encounter (INDEPENDENT_AMBULATORY_CARE_PROVIDER_SITE_OTHER): Payer: Self-pay | Admitting: Ophthalmology

## 2018-07-29 NOTE — Telephone Encounter (Signed)
Home Health Nurse Cecille Rubin [phone], called regarding patient. Stating that patient did not have a post op apt with Dr Arnoldo Morale. Patient has colostomy and is having intermittent bouts of diarrhea vs constipation. Cecille Rubin states that her abdominal wound is closed and stoma looks good. Will notify MD. Nurse aware.

## 2018-08-08 ENCOUNTER — Other Ambulatory Visit: Payer: Self-pay

## 2018-08-08 DIAGNOSIS — I82402 Acute embolism and thrombosis of unspecified deep veins of left lower extremity: Secondary | ICD-10-CM

## 2018-08-12 ENCOUNTER — Telehealth (HOSPITAL_COMMUNITY): Payer: Self-pay

## 2018-08-12 NOTE — Telephone Encounter (Signed)
The above patient or their representative was contacted and gave the following answers to these questions:         Do you have any of the following symptoms? No  Fever                    Cough                   Shortness of breath  Do  you have any of the following other symptoms?  No   muscle pain         vomiting,        diarrhea        rash         weakness        red eye        abdominal pain         bruising          bruising or bleeding              joint pain           severe headache    Have you been in contact with someone who was or has been sick in the past 2 weeks? No  Yes                 Unsure                         Unable to assess   Does the person that you were in contact with have any of the following symptoms? N/A   Cough         shortness of breath           muscle pain         vomiting,            diarrhea            rash            weakness           fever            red eye           abdominal pain           bruising  or  bleeding                joint pain                severe headache                COMMENTS OR ACTION PLAN FOR THIS PATIENT:         

## 2018-08-13 ENCOUNTER — Ambulatory Visit (INDEPENDENT_AMBULATORY_CARE_PROVIDER_SITE_OTHER): Payer: Medicare Other | Admitting: Vascular Surgery

## 2018-08-13 ENCOUNTER — Ambulatory Visit (HOSPITAL_COMMUNITY)
Admission: RE | Admit: 2018-08-13 | Discharge: 2018-08-13 | Disposition: A | Discharge status: Home / Self Care | Payer: Medicare Other | Source: Ambulatory Visit | Attending: Family | Admitting: Family

## 2018-08-13 ENCOUNTER — Encounter: Payer: Self-pay | Admitting: Vascular Surgery

## 2018-08-13 ENCOUNTER — Encounter (HOSPITAL_COMMUNITY): Payer: Self-pay

## 2018-08-13 ENCOUNTER — Other Ambulatory Visit: Payer: Self-pay

## 2018-08-13 VITALS — BP 187/95 | HR 68 | Temp 98.1°F | Resp 99 | Ht 66.0 in | Wt 188.0 lb

## 2018-08-13 DIAGNOSIS — I82402 Acute embolism and thrombosis of unspecified deep veins of left lower extremity: Secondary | ICD-10-CM

## 2018-08-13 NOTE — Progress Notes (Signed)
Patient name: Regina Hall MRN: [id number] DOB: 18-Oct-1934 Sex: female  REASON FOR VISIT:   Follow-up of left lower extremity DVT and IVC filter.  HPI:   Regina Hall is a pleasant 84 y.o. female who I had seen initially in July 2019.  She had presented with a left lower extremity DVT.  She was on Coumadin developed significant right thigh hematoma.  Given the contraindication to heparin at that time an IVC filter was placed.  This was on 06/04/2017.  The patient was not felt to be a good candidate for long-term anticoagulation given that she had a spontaneous hematoma in her right thigh.  I did not favor removing the filter.   The patient does have a history of chronic venous insufficiency and lymphedema.  We have discussed conservative measures multiple times.  When I saw her last she was having some right lower extremity swelling so venous duplex scan was obtained at that time which showed deep venous thrombosis involving the right common femoral vein and femoral vein.  This was new.  I therefore felt it necessary to place her on Xarelto.  I ordered a follow-up duplex scan in 3 months.  She refused that study today.  The patient is a poor historian but she tells me that someone told her she needed to stop her Xarelto because of the risk of bleeding.  For this reason she has not been on Xarelto.  Her main complaint is significant bilateral lower extremity swelling which is more significant on the right side.  The pain improves slightly with elevation but does not resolve and has been gradually progressing.  She is unable to exercise as this limits her mobility.  Past Medical History:  Diagnosis Date  . Anemia   . Anxiety    pt denies  . Arthritis    KNEES AND BACK  . Bladder incontinence   . Cancer ([place])    basal cell cancer on nose  . Edema of both feet   . Hypertension   . Pneumonia   . PONV (postoperative nausea and vomiting)    PONV X 1 EPISODE, COMES OUT OF ANESTHESIA FAST,  SOME AWARENESS DURING END OF COLONSCOPY  . Scoliosis   . VRE (vancomycin resistant enterococcus) culture positive     Family History  Problem Relation Age of Onset  . Congestive Heart Failure Mother   . Colon cancer Father   . Cancer Brother   . Arthritis Sister   . Asthma Sister   . Diabetes Son   . Diabetes Son     SOCIAL HISTORY: Social History   Tobacco Use  . Smoking status: Never Smoker  . Smokeless tobacco: Never Used  Substance Use Topics  . Alcohol use: Yes    Comment: OCC WINE    Allergies  Allergen Reactions  . Penicillins Rash and Other (See Comments)    Has patient had a PCN reaction causing immediate rash, facial/tongue/throat swelling, SOB or lightheadedness with hypotension: ###Yes## Has patient had a PCN reaction causing severe rash involving mucus membranes or skin necrosis: No Has patient had a PCN reaction that required hospitalization No Has patient had a PCN reaction occurring within the last 10 years: No If all of the above answers are "NO", then may proceed with Cephalosporin use.   . Vancomycin Shortness Of Breath and Itching    Current Outpatient Medications  Medication Sig Dispense Refill  . Biotin 1 MG CAPS Take by mouth.    . Cholecalciferol (  VITAMIN D3) 5000 units TABS Take 5,000 Units by mouth daily.    . Cranberry 450 MG CAPS Take 1 capsule by mouth daily.     Marland Kitchen docusate sodium (COLACE) 100 MG capsule Take 100 mg by mouth daily.    . Glucosamine Sulfate 500 MG TABS Take 2 tablets by mouth daily.     Marland Kitchen losartan (COZAAR) 25 MG tablet Take 0.5 tablets (12.5 mg total) by mouth daily. (Patient not taking: Reported on 08/13/2018) 90 tablet 1  . magnesium hydroxide (MILK OF MAGNESIA) 400 MG/5ML suspension Take 30 mLs by mouth daily as needed for mild constipation.    . montelukast (SINGULAIR) 10 MG tablet Take 1 tablet (10 mg total) by mouth every morning. (Patient not taking: Reported on 08/13/2018) 30 tablet 3  . Multiple Vitamin  (MULTIVITAMIN) tablet Take 1 tablet by mouth daily.    . NON FORMULARY Diet Type:  NAS    . Nutritional Supplements (ENSURE ENLIVE PO) Take 1 Bottle by mouth 3 (three) times daily between meals.    . ondansetron (ZOFRAN) 4 MG tablet TK 1 T PO D PRF NAUSEA    . oxyCODONE-acetaminophen (PERCOCET/ROXICET) 5-325 MG tablet TK 1 T  PO D PRF PAIN    . pantoprazole (PROTONIX) 40 MG tablet Take 1 tablet (40 mg total) by mouth 2 (two) times daily. (Patient not taking: Reported on 07/08/2018) 60 tablet 1  . potassium chloride SA (K-DUR,KLOR-CON) 20 MEQ tablet Take 40 mEq by mouth daily.    Marland Kitchen Propylene Glycol-Glycerin (SOOTHE) 0.6-0.6 % SOLN Place 2 drops into both eyes 4 (four) times daily. And as needed    . psyllium (REGULOID) 0.52 g capsule Take 0.52 g by mouth daily.    . Rivaroxaban 15 & 20 MG TBPK Take as directed on package: Start with one 15mg  tablet by mouth twice a day with food. On Day 22, switch to one 20mg  tablet once a day with food. (Patient not taking: Reported on 07/08/2018) 51 each 0  . senna-docusate (SENOKOT-S) 8.6-50 MG tablet Take 1 tablet by mouth at bedtime as needed for mild constipation. (Patient not taking: Reported on 08/13/2018) 1 tablet 0  . silver sulfADIAZINE (SILVADENE) 1 % cream silver sulfadiazine 1 % topical cream  APP TOPICALLY TO AFFECTED SKIN D UTD    . triamcinolone cream (KENALOG) 0.1 % APP AA BID PRN    . triamterene-hydrochlorothiazide (DYAZIDE) 37.5-25 MG capsule triamterene 37.5 mg-hydrochlorothiazide 25 mg capsule  TK 1 C PO QD    . vitamin B-12 (CYANOCOBALAMIN) 500 MCG tablet Take 500 mcg by mouth daily.     No current facility-administered medications for this visit.     REVIEW OF SYSTEMS:  [X]  denotes positive finding, [ ]  denotes negative finding Cardiac  Comments:  Chest pain or chest pressure:    Shortness of breath upon exertion:    Short of breath when lying flat:    Irregular heart rhythm:        Vascular    Pain in calf, thigh, or hip brought on  by ambulation:    Pain in feet at night that wakes you up from your sleep:     Blood clot in your veins:    Leg swelling:         Pulmonary    Oxygen at home:    Productive cough:     Wheezing:         Neurologic    Sudden weakness in arms or legs:  Sudden numbness in arms or legs:     Sudden onset of difficulty speaking or slurred speech:    Temporary loss of vision in one eye:     Problems with dizziness:         Gastrointestinal    Blood in stool:     Vomited blood:         Genitourinary    Burning when urinating:     Blood in urine:        Psychiatric    Major depression:         Hematologic    Bleeding problems:    Problems with blood clotting too easily:        Skin    Rashes or ulcers:        Constitutional    Fever or chills:     PHYSICAL EXAM:   Vitals:   08/13/18 1324  BP: (!) 187/95  Pulse: 68  Resp: (!) 99  Temp: 98.1 F (36.7 C)  TempSrc: Oral  SpO2: 99%  Weight: 188 lb (85.3 kg)  Height: 5\' 6"  (1.676 m)    GENERAL: The patient is a well-nourished female, in no acute distress. The vital signs are documented above. CARDIAC: There is a regular rate and rhythm.  VASCULAR: I do not detect carotid bruits. She has severe bilateral lower extremity swelling which is nonpitting.  This is more significant on the right side.   The right calf measures 20 inches in diameter.  The left calf measures 18 inches in diameter.  PULMONARY: There is good air exchange bilaterally without wheezing or rales. ABDOMEN: Soft and non-tender with normal pitched bowel sounds.  MUSCULOSKELETAL: There are no major deformities or cyanosis. NEUROLOGIC: No focal weakness or paresthesias are detected. SKIN: There are no ulcers or rashes noted. PSYCHIATRIC: The patient has a normal affect.  DATA:    She refused venous duplex scan today.  MEDICAL ISSUES:   COMBINED CHRONIC VENOUS INSUFFICIENCY AND LYMPHEDEMA: Her swelling has worsened.  She has been elevating her  legs and this is helped some but continues to have significant swelling.  She is unable to exercise as the swelling limits her mobility.  She is unable to do manual lymphatic drainage techniques because of her mobility.  She is unable to apply compression dressings.  For all these reasons I think she would be a good candidate for the Flexitouch Pneumatic compression system.  She was previously in the process of trying to obtain this however she had a bowel perforation and this was then put on hold after she had a right lower extremity DVT which was new.  I plan on seeing her back in April with a abdominal x-ray to check her IVC filter.  Given that she has a contraindication to heparin with bilateral lower extremity DVTs do not have any plans to remove the filter.  He knows to call sooner if he has problems. Deitra Mayo [facility] [phone]

## 2018-08-19 ENCOUNTER — Encounter (INDEPENDENT_AMBULATORY_CARE_PROVIDER_SITE_OTHER): Payer: Medicare Other | Admitting: Ophthalmology

## 2018-08-22 ENCOUNTER — Telehealth: Payer: Self-pay | Admitting: Cardiovascular Disease

## 2018-08-22 NOTE — Telephone Encounter (Signed)
No answer

## 2018-08-22 NOTE — Telephone Encounter (Signed)
Has questions about taking lasix - another doctor trying to get her take it

## 2018-08-27 NOTE — Telephone Encounter (Signed)
Went to kidney doctor - saw a different provider than normal.  Was told she needed fluid medication due to swollen feet.  Stated that he was suggesting Lasix, but states that Dr. Bronson Ing told her not to take it due to her kidney function.  Stated that she did pick it up, but did not start yet.  Does have OV scheduled with Dr. Bronson Ing on 09/01/18.  She will bring bottle with her to visit & discuss further in detail with doctor at visit.

## 2018-09-01 ENCOUNTER — Other Ambulatory Visit: Payer: Self-pay

## 2018-09-01 ENCOUNTER — Telehealth: Payer: Self-pay | Admitting: *Deleted

## 2018-09-01 ENCOUNTER — Ambulatory Visit (INDEPENDENT_AMBULATORY_CARE_PROVIDER_SITE_OTHER): Payer: Medicare Other | Admitting: Cardiovascular Disease

## 2018-09-01 ENCOUNTER — Encounter: Payer: Self-pay | Admitting: Cardiovascular Disease

## 2018-09-01 VITALS — BP 138/78 | HR 76 | Temp 98.1°F | Ht 65.0 in | Wt 188.0 lb

## 2018-09-01 DIAGNOSIS — I872 Venous insufficiency (chronic) (peripheral): Secondary | ICD-10-CM

## 2018-09-01 DIAGNOSIS — I82501 Chronic embolism and thrombosis of unspecified deep veins of right lower extremity: Secondary | ICD-10-CM

## 2018-09-01 DIAGNOSIS — I10 Essential (primary) hypertension: Secondary | ICD-10-CM

## 2018-09-01 DIAGNOSIS — I82402 Acute embolism and thrombosis of unspecified deep veins of left lower extremity: Secondary | ICD-10-CM | POA: Diagnosis not present

## 2018-09-01 DIAGNOSIS — I4891 Unspecified atrial fibrillation: Secondary | ICD-10-CM | POA: Diagnosis not present

## 2018-09-01 DIAGNOSIS — R600 Localized edema: Secondary | ICD-10-CM

## 2018-09-01 DIAGNOSIS — D649 Anemia, unspecified: Secondary | ICD-10-CM

## 2018-09-01 DIAGNOSIS — I9789 Other postprocedural complications and disorders of the circulatory system, not elsewhere classified: Secondary | ICD-10-CM

## 2018-09-01 NOTE — Patient Instructions (Addendum)
Medication Instructions:   Your physician recommends that you continue on your current medications as directed. Please refer to the Current Medication list given to you today.  Labwork:  NONE  Testing/Procedures:  NONE  Follow-Up:  Your physician recommends that you schedule a follow-up appointment in: as needed.   Any Other Special Instructions Will Be Listed Below (If Applicable).  If you need a refill on your cardiac medications before your next appointment, please call your pharmacy. 

## 2018-09-01 NOTE — Telephone Encounter (Signed)

## 2018-09-01 NOTE — Progress Notes (Signed)
SUBJECTIVE: The patient is an 84 year old woman with a history of DVT status post IVC filter, hypertension, and anemia.  She had a telehealth visit with Bernerd Pho, PA-C on 05/16/2018.  She underwent abdominal surgery in February and developed postoperative rapid atrial fibrillation.  She was not anticoagulated as it was a brief episode and she was anemic.  She told me at length about her surgery earlier this year.  She denies chest pain, shortness of breath, and palpitations.  She has significant bilateral leg edema from chronic venous insufficiency and lymphedema.  Her husband is also my patient.  He has prostate cancer.  Review of Systems: As per "subjective", otherwise negative.  Allergies  Allergen Reactions  . Penicillins Rash and Other (See Comments)    Has patient had a PCN reaction causing immediate rash, facial/tongue/throat swelling, SOB or lightheadedness with hypotension: ###Yes## Has patient had a PCN reaction causing severe rash involving mucus membranes or skin necrosis: No Has patient had a PCN reaction that required hospitalization No Has patient had a PCN reaction occurring within the last 10 years: No If all of the above answers are "NO", then may proceed with Cephalosporin use.   . Vancomycin Shortness Of Breath and Itching    Current Outpatient Medications  Medication Sig Dispense Refill  . Biotin 1 MG CAPS Take by mouth.    . Cholecalciferol (VITAMIN D3) 5000 units TABS Take 5,000 Units by mouth daily.    . Cranberry 450 MG CAPS Take 1 capsule by mouth daily.     Marland Kitchen docusate sodium (COLACE) 100 MG capsule Take 100 mg by mouth daily.    . Glucosamine Sulfate 500 MG TABS Take 2 tablets by mouth daily.     Marland Kitchen losartan (COZAAR) 25 MG tablet Take 0.5 tablets (12.5 mg total) by mouth daily. 90 tablet 1  . magnesium hydroxide (MILK OF MAGNESIA) 400 MG/5ML suspension Take 30 mLs by mouth daily as needed for mild constipation.    . montelukast (SINGULAIR) 10  MG tablet Take 1 tablet (10 mg total) by mouth every morning. 30 tablet 3  . Multiple Vitamin (MULTIVITAMIN) tablet Take 1 tablet by mouth daily.    . NON FORMULARY Diet Type:  NAS    . Nutritional Supplements (ENSURE ENLIVE PO) Take 1 Bottle by mouth 3 (three) times daily between meals.    . ondansetron (ZOFRAN) 4 MG tablet TK 1 T PO D PRF NAUSEA    . oxyCODONE-acetaminophen (PERCOCET/ROXICET) 5-325 MG tablet TK 1 T  PO D PRF PAIN    . pantoprazole (PROTONIX) 40 MG tablet Take 1 tablet (40 mg total) by mouth 2 (two) times daily. 60 tablet 1  . potassium chloride SA (K-DUR,KLOR-CON) 20 MEQ tablet Take 40 mEq by mouth daily.    Marland Kitchen Propylene Glycol-Glycerin (SOOTHE) 0.6-0.6 % SOLN Place 2 drops into both eyes 4 (four) times daily. And as needed    . psyllium (REGULOID) 0.52 g capsule Take 0.52 g by mouth daily.    . silver sulfADIAZINE (SILVADENE) 1 % cream silver sulfadiazine 1 % topical cream  APP TOPICALLY TO AFFECTED SKIN D UTD    . triamcinolone cream (KENALOG) 0.1 % APP AA BID PRN    . vitamin B-12 (CYANOCOBALAMIN) 500 MCG tablet Take 500 mcg by mouth daily.     No current facility-administered medications for this visit.     Past Medical History:  Diagnosis Date  . Anemia   . Anxiety    pt denies  .  Arthritis    KNEES AND BACK  . Bladder incontinence   . Cancer ([place])    basal cell cancer on nose  . Edema of both feet   . Hypertension   . Pneumonia   . PONV (postoperative nausea and vomiting)    PONV X 1 EPISODE, COMES OUT OF ANESTHESIA FAST, SOME AWARENESS DURING END OF COLONSCOPY  . Scoliosis   . VRE (vancomycin resistant enterococcus) culture positive     Past Surgical History:  Procedure Laterality Date  . ABDOMINAL HYSTERECTOMY     2006 VAG HYST  . ABDOMINAL SURGERY    . APPLICATION OF WOUND VAC  03/10/2018   Procedure: APPLICATION OF WOUND VAC;  Surgeon: [name], MD;  Location: [facility];  Service: General;;  . BACK SURGERY     LOWER, X2  . BIOPSY Right  05/02/2017   Procedure: BIOPSY OF RIGHT UPPER EYELID LESION;  Surgeon: [name], MD;  Location: [facility];  Service: Ophthalmology;  Laterality: Right;  . COLECTOMY WITH COLOSTOMY CREATION/HARTMANN PROCEDURE N/A 02/28/2018   Procedure: PARTIAL COLECTOMY WITH COLOSTOMY;  Surgeon: [name], MD;  Location: [facility];  Service: General;  Laterality: N/A;  . EYE SURGERY Bilateral    cataract surgery with lens implant  . FLEXIBLE SIGMOIDOSCOPY N/A 02/21/2018   Procedure: FLEXIBLE SIGMOIDOSCOPY;  Surgeon: [name], MD;  [facility];  Service: Endoscopy;  Laterality: N/A;  . JOINT REPLACEMENT     2011 LT HIP  . LAPAROTOMY N/A 03/10/2018   Procedure: EXPLORATORY LAPAROTOMY;  Surgeon: [name], MD;  Location: [facility];  Service: General;  Laterality: N/A;  . POLYPECTOMY  02/21/2018   Procedure: POLYPECTOMY;  Surgeon: [name], MD;  [facility];  Service: Endoscopy;;  rectum  . RECONSTRUCTION OF EYELID Right 05/02/2017   Procedure: TOTAL RECONSTRUCTION OF UPPER EYELID RIGHT EYE WITH FULL THICKNESS SKIN GRAFT FROM RIGHT POSTERIOR EAR;  Surgeon: [name], MD;  Location: [facility];  Service: Ophthalmology;  Laterality: Right;  . SECONDARY CLOSURE OF WOUND  03/10/2018   Procedure: SECONDARY CLOSURE OF WOUND;  Surgeon: [name], MD;  Location: [facility];  Service: General;;  . TONSILLECTOMY    . TOTAL KNEE ARTHROPLASTY Right 04/27/2014   Procedure: [place] TOTAL KNEE ARTHROPLASTY;  Surgeon: Rod Can, MD;  Location: [facility];  Service: Orthopedics;  Laterality: Right;  . TUBAL LIGATION     1974  . VENA CAVA FILTER PLACEMENT N/A 06/04/2017   Procedure: INSERTION VENA-CAVA FILTER;  Surgeon: Angelia Mould, MD;  Location: [facility];  Service: Vascular;  Laterality: N/A;    Social History   Socioeconomic History  . Marital status: Married    Spouse name: Not on file  . Number of children: 3  . Years of education: 69  . Highest education level: Associate  degree: occupational, Hotel manager, or vocational program  Occupational History  . Occupation: Retired    Comment: Corporate treasurer  Social Needs  . Financial resource strain: Somewhat hard  . Food insecurity    Worry: Never true    Inability: Never true  . Transportation needs    Medical: No    Non-medical: No  Tobacco Use  . Smoking status: Never Smoker  . Smokeless tobacco: Never Used  Substance and Sexual Activity  . Alcohol use: Yes    Comment: OCC WINE  . Drug use: No  . Sexual activity: Not on file  Lifestyle  . Physical activity    Days  per week: 0 days    Minutes per session: 0 min  . Stress: Only a little  Relationships  . Social connections    Talks on phone: More than three times a week    Gets together: Once a week    Attends religious service: Never    Active member of club or organization: No    Attends meetings of clubs or organizations: Never    Relationship status: Married  . Intimate partner violence    Fear of current or ex partner: No    Emotionally abused: No    Physically abused: No    Forced sexual activity: No  Other Topics Concern  . Not on file  Social History Narrative  . Not on file     Vitals:   09/01/18 1337  BP: 138/78  Pulse: 76  Temp: 98.1 F (36.7 C)  SpO2: 98%  Weight: 188 lb (85.3 kg)  Height: 5\' 5"  (1.651 m)    Wt Readings from Last 3 Encounters:  09/01/18 188 lb (85.3 kg)  08/13/18 188 lb (85.3 kg)  07/08/18 196 lb (88.9 kg)     PHYSICAL EXAM General: NAD HEENT: Normal. Neck: No JVD, no thyromegaly. Lungs: Clear to auscultation bilaterally with normal respiratory effort. CV: Regular rate and rhythm, normal S1/S2, no S3/S4, no murmur.  Chronic bilateral leg edema/lymphedema.   Abdomen: Soft, nontender, no distention.  Neurologic: Alert and oriented.  Psych: Normal affect. Skin: Normal. Musculoskeletal: No gross deformities.    ECG: Reviewed above under Subjective   Labs: Lab Results  Component Value  Date/Time   K 3.3 (L) 07/08/2018 02:49 PM   BUN 24 (H) 07/08/2018 02:49 PM   BUN 33 (H) 01/28/2018 12:38 PM   CREATININE 1.35 (H) 07/08/2018 02:49 PM   ALT 16 07/08/2018 02:49 PM   HGB 12.1 07/08/2018 02:49 PM   HGB 13.8 01/28/2018 12:38 PM     Lipids: No results found for: LDLCALC, LDLDIRECT, CHOL, TRIG, HDL     ASSESSMENT AND PLAN:  1.  Postoperative atrial fibrillation: Diagnosed during admission for large bowel obstruction and acute diverticulitis in February 2020.  She converted to sinus rhythm within 5 hours of onset with use of IV amiodarone.  She was on oral amiodarone for some time but it was eventually discontinued.  Symptomatically stable.  2.  Hypertension: Blood pressure is normal.  No changes to therapy.  3.  Right lower extremity DVT: Followed by vascular surgery.  4.  Anemia: Hemoglobin 12.1 on 07/08/2018.  5.  Chronic combined venous insufficiency and lymphedema: Followed by vascular surgery.  She refused a duplex scan when she saw Dr. Doren Custard on 08/13/2018.  Disposition: Follow up as needed   Kate Sable, M.D., F.A.C.C.

## 2018-09-23 ENCOUNTER — Other Ambulatory Visit: Payer: Self-pay | Admitting: Family Medicine

## 2018-10-07 ENCOUNTER — Telehealth: Payer: Self-pay | Admitting: Cardiovascular Disease

## 2018-10-07 NOTE — Telephone Encounter (Signed)
I do not recall saying that.  If her nephrologist prescribed Lasix 10 mg daily I think that would be fine.  She actually follows with vascular surgery for her chronic lower extremity lymphedema and venous insufficiency.

## 2018-10-07 NOTE — Telephone Encounter (Signed)
Lori from home health says last week car door punctured her leg and has been going to wound clinic since this has been profusely leaking since that time - says she has swelling in both legs for the last month - went to see nephrologist who gave her lasix 10 mg daily - pt doesn't want to start this saying that Dr Bronson Ing told her never to take lasix again - denies any SOB cannot get on a scale to weigh

## 2018-10-07 NOTE — Telephone Encounter (Signed)
Received telephone call from [facility] with [facility]. States that Dr. Bronson Ing told her not to ever take Lasix again. Patient is retaining fluid around her ankles, feet and legs.  Please call Cecille Rubin at [phone].

## 2018-10-07 NOTE — Telephone Encounter (Signed)
Left detailed message for [place] home health regarding lasix and f/u with vascular

## 2018-10-20 ENCOUNTER — Telehealth: Payer: Self-pay

## 2018-10-20 NOTE — Progress Notes (Signed)
[facility] Note  10/21/2018     CHIEF COMPLAINT Patient presents for Retina Follow Up   HISTORY OF PRESENT ILLNESS: Regina Hall is a 84 y.o. female who presents to the clinic today for:   HPI    Retina Follow Up    Patient presents with  Wet AMD.  In right eye.  This started months ago.  Severity is moderate.  Duration of 3 months.  Since onset it is gradually improving.  I, the attending physician,  performed the HPI with the patient and updated documentation appropriately.          Comments    84 y/o female pt here for 3 mo f/u for exu ARMD OD.  VA much improved OU since last visit.  Pt repeatedly states after being asked different questions for clarification that she uses a green top drop four times daily in both eyes that "shrink her eyes."  Denies pain, flashes, floaters.  Letters appear more distorted OS.        Last edited by [name], MD on 10/21/2018  2:46 PM. (History)    pt states she is using a "green top drop to make her eyes smaller" she states they worked well and helped her see better  Referring physician: Vevelyn Royals, MD No address on file  HISTORICAL INFORMATION:   Selected notes from the MEDICAL RECORD NUMBER Referred by Dr. Vevelyn Royals for concern of ARMD LEE: [name]) [BCVA: OD: OS:] Ocular Hx-pseudo OU, UL reconstuction (Dr. [name], 02.24.20) PMH-anxiety, arthritis, cancer, HTN, scoliosis   CURRENT MEDICATIONS: Current Outpatient Medications (Ophthalmic Drugs)  Medication Sig  . Propylene Glycol-Glycerin (SOOTHE) 0.6-0.6 % SOLN Place 2 drops into both eyes 4 (four) times daily. And as needed   No current facility-administered medications for this visit.  (Ophthalmic Drugs)   Current Outpatient Medications (Other)  Medication Sig  . Biotin 1 MG CAPS Take by mouth.  . bumetanide (BUMEX) 1 MG tablet bumetanide 1 mg tablet  . cephALEXin (KEFLEX) 500 MG capsule cephalexin 500 mg capsule  .  Cholecalciferol (VITAMIN D3) 5000 units TABS Take 5,000 Units by mouth daily.  . Cranberry 450 MG CAPS Take 1 capsule by mouth daily.   Marland Kitchen docusate sodium (COLACE) 100 MG capsule Take 100 mg by mouth daily.  . Glucosamine Sulfate 500 MG TABS Take 2 tablets by mouth daily.   Marland Kitchen losartan (COZAAR) 25 MG tablet Take 0.5 tablets (12.5 mg total) by mouth daily.  . magnesium hydroxide (MILK OF MAGNESIA) 400 MG/5ML suspension Take 30 mLs by mouth daily as needed for mild constipation.  . montelukast (SINGULAIR) 10 MG tablet TAKE 1 TABLET(10 MG) BY MOUTH EVERY MORNING  . Multiple Vitamin (MULTIVITAMIN) tablet Take 1 tablet by mouth daily.  . naproxen (NAPROSYN) 500 MG tablet naproxen 500 mg tablet  Take 1 tablet twice a day by oral route.  . NON FORMULARY Diet Type:  NAS  . Nutritional Supplements (ENSURE ENLIVE PO) Take 1 Bottle by mouth 3 (three) times daily between meals.  . ondansetron (ZOFRAN) 4 MG tablet TK 1 T PO D PRF NAUSEA  . oxyCODONE-acetaminophen (PERCOCET/ROXICET) 5-325 MG tablet TK 1 T  PO D PRF PAIN  . pantoprazole (PROTONIX) 40 MG tablet Take 1 tablet (40 mg total) by mouth 2 (two) times daily.  . potassium chloride SA (K-DUR,KLOR-CON) 20 MEQ tablet Take 40 mEq by mouth daily.  . psyllium (REGULOID) 0.52 g capsule Take 0.52 g by mouth daily.  Marland Kitchen  silver sulfADIAZINE (SILVADENE) 1 % cream silver sulfadiazine 1 % topical cream  APP TOPICALLY TO AFFECTED SKIN D UTD  . triamcinolone cream (KENALOG) 0.1 % APP AA BID PRN  . vitamin B-12 (CYANOCOBALAMIN) 500 MCG tablet Take 500 mcg by mouth daily.   No current facility-administered medications for this visit.  (Other)      REVIEW OF SYSTEMS: ROS    Positive for: Gastrointestinal, Genitourinary, Musculoskeletal, Eyes, Respiratory   Negative for: Constitutional, Neurological, Skin, HENT, Endocrine, Cardiovascular, Psychiatric, Allergic/Imm, Heme/Lymph   Last edited by Matthew Folks, COA on 10/21/2018  2:25 PM. (History)        ALLERGIES Allergies  Allergen Reactions  . Penicillins Rash and Other (See Comments)    Has patient had a PCN reaction causing immediate rash, facial/tongue/throat swelling, SOB or lightheadedness with hypotension: ###Yes## Has patient had a PCN reaction causing severe rash involving mucus membranes or skin necrosis: No Has patient had a PCN reaction that required hospitalization No Has patient had a PCN reaction occurring within the last 10 years: No If all of the above answers are "NO", then may proceed with Cephalosporin use.   . Vancomycin Shortness Of Breath and Itching    PAST MEDICAL HISTORY Past Medical History:  Diagnosis Date  . Anemia   . Anxiety    pt denies  . Arthritis    KNEES AND BACK  . Bladder incontinence   . Cancer ([place])    basal cell cancer on nose  . Edema of both feet   . Hypertension   . Hypertensive retinopathy    OU  . Macular degeneration    Wet OD, Dry OS  . Pneumonia   . PONV (postoperative nausea and vomiting)    PONV X 1 EPISODE, COMES OUT OF ANESTHESIA FAST, SOME AWARENESS DURING END OF COLONSCOPY  . Scoliosis   . VRE (vancomycin resistant enterococcus) culture positive    Past Surgical History:  Procedure Laterality Date  . ABDOMINAL HYSTERECTOMY     2006 VAG HYST  . ABDOMINAL SURGERY    . APPLICATION OF WOUND VAC  03/10/2018   Procedure: APPLICATION OF WOUND VAC;  Surgeon: [name], MD;  Location: [facility];  Service: General;;  . BACK SURGERY     LOWER, X2  . BIOPSY Right 05/02/2017   Procedure: BIOPSY OF RIGHT UPPER EYELID LESION;  Surgeon: [name], MD;  Location: [facility];  Service: Ophthalmology;  Laterality: Right;  . CATARACT EXTRACTION Bilateral   . COLECTOMY WITH COLOSTOMY CREATION/HARTMANN PROCEDURE N/A 02/28/2018   Procedure: PARTIAL COLECTOMY WITH COLOSTOMY;  Surgeon: [name], MD;  Location: [facility];  Service: General;  Laterality: N/A;  . EYE SURGERY Bilateral    cataract surgery with lens implant  .  FLEXIBLE SIGMOIDOSCOPY N/A 02/21/2018   Procedure: FLEXIBLE SIGMOIDOSCOPY;  Surgeon: [name], MD;  [facility];  Service: Endoscopy;  Laterality: N/A;  . JOINT REPLACEMENT     2011 LT HIP  . LAPAROTOMY N/A 03/10/2018   Procedure: EXPLORATORY LAPAROTOMY;  Surgeon: [name], MD;  Location: [facility];  Service: General;  Laterality: N/A;  . POLYPECTOMY  02/21/2018   Procedure: POLYPECTOMY;  Surgeon: [name], MD;  [facility];  Service: Endoscopy;;  rectum  . RECONSTRUCTION OF EYELID Right 05/02/2017   Procedure: TOTAL RECONSTRUCTION OF UPPER EYELID RIGHT EYE WITH FULL THICKNESS SKIN GRAFT FROM RIGHT POSTERIOR EAR;  Surgeon: [name], MD;  Location: [facility];  Service: Ophthalmology;  Laterality: Right;  . SECONDARY CLOSURE OF WOUND  03/10/2018   Procedure: SECONDARY CLOSURE OF WOUND;  Surgeon: [name], MD;  Location: [facility];  Service: General;;  . TONSILLECTOMY    . TOTAL KNEE ARTHROPLASTY Right 04/27/2014   Procedure: [place] TOTAL KNEE ARTHROPLASTY;  Surgeon: Rod Can, MD;  Location: [facility];  Service: Orthopedics;  Laterality: Right;  . TUBAL LIGATION     1974  . VENA CAVA FILTER PLACEMENT N/A 06/04/2017   Procedure: INSERTION VENA-CAVA FILTER;  Surgeon: Angelia Mould, MD;  Location: [facility];  Service: Vascular;  Laterality: N/A;    FAMILY HISTORY Family History  Problem Relation Age of Onset  . Congestive Heart Failure Mother   . Colon cancer Father   . Cancer Brother   . Arthritis Sister   . Asthma Sister   . Diabetes Son   . Diabetes Son     SOCIAL HISTORY Social History   Tobacco Use  . Smoking status: Never Smoker  . Smokeless tobacco: Never Used  Substance Use Topics  . Alcohol use: Yes    Comment: OCC WINE  . Drug use: No         OPHTHALMIC EXAM:  Base Eye Exam    Visual Acuity (Snellen - Linear)      Right Left   Dist [place] 20/50 -2 20/50 -2   Dist ph [place] NI NI       Tonometry (Tonopen, 2:32 PM)       Right Left   Pressure 14 13       Pupils      Dark Light Shape React APD   Right 3 2 Round Brisk None   Left 3 2 Round Brisk None       Visual Fields (Counting fingers)      Left Right     Full   Restrictions Partial inner superior temporal, inferior temporal, superior nasal, inferior nasal deficiencies        Extraocular Movement      Right Left    Full, Ortho Full, Ortho       Neuro/Psych    Oriented x3: Yes   Mood/Affect: Normal       Dilation    Both eyes: 1.0% Mydriacyl, 2.5% Phenylephrine @ 2:32 PM        Slit Lamp and Fundus Exam    Slit Lamp Exam      Right Left   Lids/Lashes Dermatochalasis - upper lid, Meibomian gland dysfunction, Telangiectasia Dermatochalasis - upper lid, Meibomian gland dysfunction, Telangiectasia   Conjunctiva/Sclera White and quiet White and quiet   Cornea 8 RK scars with central iron lines, mild Arcus 8 RK scars with central iron lines, mild Arcus   Anterior Chamber Deep and quiet Deep and quiet   Iris Round and dilated Round and dilated   Lens PC IOL in good position with open PC PC IOL in good position with open PC   Vitreous Vitreous syneresis, Posterior vitreous detachment Vitreous syneresis, Posterior vitreous detachment       Fundus Exam      Right Left   Disc Sharp rim, mild Pallor Compact, mild temporal pallor, sharp rim   C/D Ratio 0.3 0.2   Macula Flat, Blunted foveal reflex, Drusen, shallow SRF, no heme - stable from prior Blunted foveal reflex, Retinal pigment epithelial mottling, Drusen, early central Atrophy, No heme or edema   Vessels Vascular attenuation Vascular attenuation   Periphery Attached, midzonal drusen Attached, midzonal drusen  IMAGING AND PROCEDURES  Imaging and Procedures for @TODAY @  OCT, Retina - OU - Both Eyes       Right Eye Quality was good. Central Foveal Thickness: 298. Progression has been stable. Findings include intraretinal hyper-reflective material, no IRF, abnormal  foveal contour, outer retinal atrophy, retinal drusen , pigment epithelial detachment, subretinal hyper-reflective material, subretinal fluid (Trace Persistent SRF).   Left Eye Quality was good. Central Foveal Thickness: 270. Progression has been stable. Findings include abnormal foveal contour, intraretinal fluid, outer retinal atrophy, retinal drusen , pigment epithelial detachment, intraretinal hyper-reflective material, no SRF, central retinal atrophy (Stable from prior).   Notes *Images captured and stored on drive  Diagnosis / Impression:  OD: exu ARMD -- persistent focal SRF; no significant change from prior OS: non-exu ARMD with early central ORA - stable from prio   Clinical management:  See below  Abbreviations: NFP - Normal foveal profile. CME - cystoid macular edema. PED - pigment epithelial detachment. IRF - intraretinal fluid. SRF - subretinal fluid. EZ - ellipsoid zone. ERM - epiretinal membrane. ORA - outer retinal atrophy. ORT - outer retinal tubulation. SRHM - subretinal hyper-reflective material        Intravitreal Injection, Pharmacologic Agent - OD - Right Eye       Time Out 10/21/2018. 3:47 PM. Confirmed correct patient, procedure, site, and patient consented.   Anesthesia Topical anesthesia was used. Anesthetic medications included Lidocaine 2%, Proparacaine 0.5%.   Procedure Preparation included 5% betadine to ocular surface, eyelid speculum. A 30 gauge needle was used.   Injection:  1.25 mg Bevacizumab (AVASTIN) SOLN   NDC: 70360-001-02, Lot: [id number] , Expiration date: 01/01/2019   Route: Intravitreal, Site: Right Eye, Waste: 0 mL  Post-op Post injection exam found visual acuity of at least counting fingers. The patient tolerated the procedure well. There were no complications. The patient received written and verbal post procedure care education.                 ASSESSMENT/PLAN:    ICD-10-CM   1. Exudative age-related macular  degeneration of right eye with active choroidal neovascularization (HCC)  H35.3211 Intravitreal Injection, Pharmacologic Agent - OD - Right Eye    Bevacizumab (AVASTIN) SOLN 1.25 mg  2. Retinal edema  H35.81 OCT, Retina - OU - Both Eyes  3. 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 Essential hypertension  I10   5. Hypertensive retinopathy of both eyes  H35.033   6. Pseudophakia of both eyes  Z96.1   7. History of radial keratotomy  Z98.890     1,2. Exudative age related macular degeneration, OD   - at presentation 06/23/2018 pt reported decreased vision OD for months -- started while in and out of hospital around January or Feb 2020  - s/p IVA OD #1 (06.08.20) #2 (07.07.20)  - today, delayed f/u at 3 mos rather than 4 wks  - OCT shows persistent SRF, essentially unchanged  - BCVA improved to 20/50 from 20/60  - recommend IVA OD #3 today (10.06.20)  - pt wishes to be treated with IVA  - RBA of procedure discussed, questions answered  - informed consent obtained and signed  - see procedure note  - f/u in 3 months, sooner prn -- DFE/OCT/possible injection  3. Age related macular degeneration, non-exudative, OS  - OCT shows central ORA with some mild cystic changes  - BCVA improved to 20/50 OS today  - pt not particularly  bothered by vision OS  - The incidence, anatomy, and pathology of dry AMD, risk of progression, and the AREDS and AREDS 2 study including smoking risks discussed with patient.  - Recommend amsler grid monitoring             - monitor  4,5. Hypertensive retinopathy OU  - discussed importance of tight BP control  - monitor   6. Pseudophakia OU  - s/p CE/IOL OU (Dr. [name])  - beautiful surgeries, doing well  - monitor  7. History of Radial Keratotomy   - 8 cut RK OU  - central iron lines OU, but cornea stable  - moinitor    Ophthalmic Meds Ordered this visit:  Meds ordered this  encounter  Medications  . Bevacizumab (AVASTIN) SOLN 1.25 mg       Return in about 12 weeks (around 01/13/2019) for f/u exu ARMD OD, DFE, OCT.  There are no Patient Instructions on file for this visit.   Explained the diagnoses, plan, and follow up with the patient and they expressed understanding.  Patient expressed understanding of the importance of proper follow up care.   Electronically signed by: Estill Bakes, COT 10/20/18 10:54 a.m.  This document serves as a record of services personally performed by Gardiner Sleeper, MD, PhD. It was created on their behalf by Ernest Mallick, OA, an ophthalmic assistant. The creation of this record is the provider's dictation and/or activities during the visit.    Electronically signed by: Ernest Mallick, OA 10.06.2020 5:07 PM  Gardiner Sleeper, M.D., Ph.D. Diseases & Surgery of the Retina and [place] 10/21/18  I have reviewed the above documentation for accuracy and completeness, and I agree with the above. Gardiner Sleeper, M.D., Ph.D. 10/21/18 5:07 PM    Abbreviations: M myopia (nearsighted); A astigmatism; H hyperopia (farsighted); P presbyopia; Mrx spectacle prescription;  CTL contact lenses; OD right eye; OS left eye; OU both eyes  XT exotropia; ET esotropia; PEK punctate epithelial keratitis; PEE punctate epithelial erosions; DES dry eye syndrome; MGD meibomian gland dysfunction; ATs artificial tears; PFAT's preservative free artificial tears; [place] nuclear sclerotic cataract; PSC posterior subcapsular cataract; ERM epi-retinal membrane; PVD posterior vitreous detachment; RD retinal detachment; DM diabetes mellitus; DR diabetic retinopathy; NPDR non-proliferative diabetic retinopathy; PDR proliferative diabetic retinopathy; CSME clinically significant macular edema; DME diabetic macular edema; dbh dot blot hemorrhages; CWS cotton wool spot; POAG primary open angle glaucoma; C/D cup-to-disc ratio; HVF humphrey  visual field; GVF goldmann visual field; OCT optical coherence tomography; IOP intraocular pressure; BRVO Branch retinal vein occlusion; CRVO central retinal vein occlusion; CRAO central retinal artery occlusion; BRAO branch retinal artery occlusion; RT retinal tear; SB scleral buckle; PPV pars plana vitrectomy; VH Vitreous hemorrhage; PRP panretinal laser photocoagulation; IVK intravitreal kenalog; VMT vitreomacular traction; MH Macular hole;  NVD neovascularization of the disc; NVE neovascularization elsewhere; AREDS age related eye disease study; ARMD age related macular degeneration; POAG primary open angle glaucoma; EBMD epithelial/anterior basement membrane dystrophy; ACIOL anterior chamber intraocular lens; IOL intraocular lens; PCIOL posterior chamber intraocular lens; Phaco/IOL phacoemulsification with intraocular lens placement; [place] photorefractive keratectomy; LASIK laser assisted in situ keratomileusis; HTN hypertension; DM diabetes mellitus; COPD chronic obstructive pulmonary disease

## 2018-10-20 NOTE — Telephone Encounter (Signed)
Pt called and said that she has had a lot of increased swelling in her legs and she would like to get in to be seen. Dr Scot Dock had recommended a Flexitouch Pneumatic compression system and she does not know if that is still something that would be beneficial given she has gotten worse.   Appt made for her to be seen and evaluated.   York Cerise, CMA

## 2018-10-21 ENCOUNTER — Ambulatory Visit (INDEPENDENT_AMBULATORY_CARE_PROVIDER_SITE_OTHER): Payer: Medicare Other | Admitting: Ophthalmology

## 2018-10-21 ENCOUNTER — Encounter (INDEPENDENT_AMBULATORY_CARE_PROVIDER_SITE_OTHER): Payer: Self-pay | Admitting: Ophthalmology

## 2018-10-21 DIAGNOSIS — H3581 Retinal edema: Secondary | ICD-10-CM

## 2018-10-21 DIAGNOSIS — H353124 Nonexudative age-related macular degeneration, left eye, advanced atrophic with subfoveal involvement: Secondary | ICD-10-CM

## 2018-10-21 DIAGNOSIS — I10 Essential (primary) hypertension: Secondary | ICD-10-CM | POA: Diagnosis not present

## 2018-10-21 DIAGNOSIS — H353211 Exudative age-related macular degeneration, right eye, with active choroidal neovascularization: Secondary | ICD-10-CM | POA: Diagnosis not present

## 2018-10-21 DIAGNOSIS — H35033 Hypertensive retinopathy, bilateral: Secondary | ICD-10-CM

## 2018-10-21 DIAGNOSIS — Z9889 Other specified postprocedural states: Secondary | ICD-10-CM

## 2018-10-21 DIAGNOSIS — Z961 Presence of intraocular lens: Secondary | ICD-10-CM

## 2018-10-21 MED ORDER — BEVACIZUMAB CHEMO INJECTION 1.25MG/0.05ML SYRINGE FOR KALEIDOSCOPE
1.2500 mg | INTRAVITREAL | Status: AC | PRN
  Administered 2018-10-21: 1.25 mg via INTRAVITREAL

## 2018-10-23 ENCOUNTER — Other Ambulatory Visit: Payer: Self-pay

## 2018-10-23 ENCOUNTER — Ambulatory Visit (INDEPENDENT_AMBULATORY_CARE_PROVIDER_SITE_OTHER): Payer: Medicare Other | Admitting: Physician Assistant

## 2018-10-23 VITALS — BP 171/82 | HR 77 | Temp 97.2°F | Resp 14 | Ht 66.0 in | Wt 197.0 lb

## 2018-10-23 DIAGNOSIS — R609 Edema, unspecified: Secondary | ICD-10-CM

## 2018-10-23 DIAGNOSIS — I890 Lymphedema, not elsewhere classified: Secondary | ICD-10-CM

## 2018-10-23 DIAGNOSIS — I872 Venous insufficiency (chronic) (peripheral): Secondary | ICD-10-CM

## 2018-10-23 NOTE — Progress Notes (Signed)
[facility] [place]     History of Present Illness Regina Hall is a pleasant 84 y.o. female who I had seen initially in July 2019.  She had presented with a left lower extremity DVT.  She was on Coumadin developed significant right thigh hematoma.  Given the contraindication to heparin at that time an IVC filter was placed.  This was on 06/04/2017.  The patient was not felt to be a good candidate for long-term anticoagulation given that she had a spontaneous hematoma in her right thigh.  I did not favor removing the filter.   The patient does have a history of chronic venous insufficiency and lymphedema.  She states she has been in the hospital and her legs were not elevated properly.  He edema and weeping clear drainage has increased over the last few weeks.  Her HH RN re wrapped her legs this am with triple layer wraps.  She reports a new left LE blister.    She is unable to exercise as this limits her mobility.   Past Medical History:  Diagnosis Date  . Anemia   . Anxiety    pt denies  . Arthritis    KNEES AND BACK  . Bladder incontinence   . Cancer ([place])    basal cell cancer on nose  . Edema of both feet   . Hypertension   . Hypertensive retinopathy    OU  . Macular degeneration    Wet OD, Dry OS  . Pneumonia   . PONV (postoperative nausea and vomiting)    PONV X 1 EPISODE, COMES OUT OF ANESTHESIA FAST, SOME AWARENESS DURING END OF COLONSCOPY  . Scoliosis   . VRE (vancomycin resistant enterococcus) culture positive     Past Surgical History:  Procedure Laterality Date  . ABDOMINAL HYSTERECTOMY     2006 VAG HYST  . ABDOMINAL SURGERY    . APPLICATION OF WOUND VAC  03/10/2018   Procedure: APPLICATION OF WOUND VAC;  Surgeon: [name], MD;  Location: [facility];  Service: General;;  . BACK SURGERY     LOWER, X2  . BIOPSY Right 05/02/2017   Procedure: BIOPSY OF RIGHT UPPER EYELID LESION;  Surgeon: [name], MD;  Location: [facility];  Service:  Ophthalmology;  Laterality: Right;  . CATARACT EXTRACTION Bilateral   . COLECTOMY WITH COLOSTOMY CREATION/HARTMANN PROCEDURE N/A 02/28/2018   Procedure: PARTIAL COLECTOMY WITH COLOSTOMY;  Surgeon: [name], MD;  Location: [facility];  Service: General;  Laterality: N/A;  . EYE SURGERY Bilateral    cataract surgery with lens implant  . FLEXIBLE SIGMOIDOSCOPY N/A 02/21/2018   Procedure: FLEXIBLE SIGMOIDOSCOPY;  Surgeon: [name], MD;  [facility];  Service: Endoscopy;  Laterality: N/A;  . JOINT REPLACEMENT     2011 LT HIP  . LAPAROTOMY N/A 03/10/2018   Procedure: EXPLORATORY LAPAROTOMY;  Surgeon: [name], MD;  Location: [facility];  Service: General;  Laterality: N/A;  . POLYPECTOMY  02/21/2018   Procedure: POLYPECTOMY;  Surgeon: [name], MD;  [facility];  Service: Endoscopy;;  rectum  . RECONSTRUCTION OF EYELID Right 05/02/2017   Procedure: TOTAL RECONSTRUCTION OF UPPER EYELID RIGHT EYE WITH FULL THICKNESS SKIN GRAFT FROM RIGHT POSTERIOR EAR;  Surgeon: [name], MD;  Location: [facility];  Service: Ophthalmology;  Laterality: Right;  . SECONDARY CLOSURE OF WOUND  03/10/2018   Procedure: SECONDARY CLOSURE OF WOUND;  Surgeon: [name], MD;  Location: [facility];  Service: General;;  . TONSILLECTOMY    . TOTAL KNEE ARTHROPLASTY Right 04/27/2014   Procedure: [place] TOTAL KNEE ARTHROPLASTY;  Surgeon: Rod Can, MD;  Location: [facility];  Service: Orthopedics;  Laterality: Right;  . TUBAL LIGATION     1974  . VENA CAVA FILTER PLACEMENT N/A 06/04/2017   Procedure: INSERTION VENA-CAVA FILTER;  Surgeon: Angelia Mould, MD;  Location: [facility];  Service: Vascular;  Laterality: N/A;    Social History   Socioeconomic History  . Marital status: Married    Spouse name: Not on file  . Number of children: 3  . Years of education: 17  . Highest education level: Associate degree: occupational, Hotel manager, or vocational program  Occupational History  .  Occupation: Retired    Comment: Corporate treasurer  Social Needs  . Financial resource strain: Somewhat hard  . Food insecurity    Worry: Never true    Inability: Never true  . Transportation needs    Medical: No    Non-medical: No  Tobacco Use  . Smoking status: Never Smoker  . Smokeless tobacco: Never Used  Substance and Sexual Activity  . Alcohol use: Yes    Comment: OCC WINE  . Drug use: No  . Sexual activity: Not on file  Lifestyle  . Physical activity    Days per week: 0 days    Minutes per session: 0 min  . Stress: Only a little  Relationships  . Social connections    Talks on phone: More than three times a week    Gets together: Once a week    Attends religious service: Never    Active member of club or organization: No    Attends meetings of clubs or organizations: Never    Relationship status: Married  . Intimate partner violence    Fear of current or ex partner: No    Emotionally abused: No    Physically abused: No    Forced sexual activity: No  Other Topics Concern  . Not on file  Social History Narrative  . Not on file    Family History  Problem Relation Age of Onset  . Congestive Heart Failure Mother   . Colon cancer Father   . Cancer Brother   . Arthritis Sister   . Asthma Sister   . Diabetes Son   . Diabetes Son     Current Outpatient Medications on File Prior to Visit  Medication Sig Dispense Refill  . Biotin 1 MG CAPS Take by mouth.    . bumetanide (BUMEX) 1 MG tablet bumetanide 1 mg tablet    . cephALEXin (KEFLEX) 500 MG capsule cephalexin 500 mg capsule    . Cholecalciferol (VITAMIN D3) 5000 units TABS Take 5,000 Units by mouth daily.    . Cranberry 450 MG CAPS Take 1 capsule by mouth daily.     Marland Kitchen docusate sodium (COLACE) 100 MG capsule Take 100 mg by mouth daily.    . Glucosamine Sulfate 500 MG TABS Take 2 tablets by mouth daily.     Marland Kitchen losartan (COZAAR) 25 MG tablet Take 0.5 tablets (12.5 mg total) by mouth daily. 90 tablet 1  .  magnesium hydroxide (MILK OF MAGNESIA) 400 MG/5ML suspension Take 30 mLs by mouth daily as needed for mild constipation.    . montelukast (SINGULAIR) 10 MG tablet TAKE 1 TABLET(10 MG) BY MOUTH EVERY MORNING 90 tablet 3  . Multiple Vitamin (MULTIVITAMIN) tablet Take 1 tablet by mouth daily.    . naproxen (  NAPROSYN) 500 MG tablet naproxen 500 mg tablet  Take 1 tablet twice a day by oral route.    . NON FORMULARY Diet Type:  NAS    . Nutritional Supplements (ENSURE ENLIVE PO) Take 1 Bottle by mouth 3 (three) times daily between meals.    . ondansetron (ZOFRAN) 4 MG tablet TK 1 T PO D PRF NAUSEA    . oxyCODONE-acetaminophen (PERCOCET/ROXICET) 5-325 MG tablet TK 1 T  PO D PRF PAIN    . pantoprazole (PROTONIX) 40 MG tablet Take 1 tablet (40 mg total) by mouth 2 (two) times daily. 60 tablet 1  . potassium chloride SA (K-DUR,KLOR-CON) 20 MEQ tablet Take 40 mEq by mouth daily.    Marland Kitchen Propylene Glycol-Glycerin (SOOTHE) 0.6-0.6 % SOLN Place 2 drops into both eyes 4 (four) times daily. And as needed    . psyllium (REGULOID) 0.52 g capsule Take 0.52 g by mouth daily.    . silver sulfADIAZINE (SILVADENE) 1 % cream silver sulfadiazine 1 % topical cream  APP TOPICALLY TO AFFECTED SKIN D UTD    . triamcinolone cream (KENALOG) 0.1 % APP AA BID PRN    . vitamin B-12 (CYANOCOBALAMIN) 500 MCG tablet Take 500 mcg by mouth daily.     No current facility-administered medications on file prior to visit.     Allergies as of 10/23/2018 - Review Complete 10/21/2018  Allergen Reaction Noted  . Penicillins Rash and Other (See Comments) 07/17/2011  . Vancomycin Shortness Of Breath and Itching 01/28/2018     ROS:   General:  No weight loss, Fever, chills  HEENT: No recent headaches, no nasal bleeding, no visual changes, no sore throat  Neurologic: No dizziness, blackouts, seizures. No recent symptoms of stroke or mini- stroke. No recent episodes of slurred speech, or temporary blindness.  Cardiac: No recent  episodes of chest pain/pressure, no shortness of breath at rest.  No shortness of breath with exertion.  Denies history of atrial fibrillation or irregular heartbeat  Vascular: No history of rest pain in feet.  No history of claudication.  No history of non-healing ulcer, positive history of DVT   Pulmonary: No home oxygen, no productive cough, no hemoptysis,  No asthma or wheezing  Musculoskeletal:  [x ] Arthritis, [ ]  Low back pain,  [ ]  Joint pain  Hematologic:No history of hypercoagulable state.  No history of easy bleeding.  No history of anemia  Gastrointestinal: No hematochezia or melena,  No gastroesophageal reflux, no trouble swallowing  Urinary: [x ] chronic Kidney disease, [ ]  on HD - [ ]  MWF or [ ]  TTHS, [ ]  Burning with urination, [ ]  Frequent urination, [ ]  Difficulty urinating;   Skin: No rashes  Psychological: No history of anxiety,  No history of depression  Physical Examination  There were no vitals filed for this visit.  There is no height or weight on file to calculate BMI.  General:  Alert and oriented, no acute distress HEENT: Normal Neck: No bruit or JVD Pulmonary: Clear to auscultation bilaterally Cardiac: Regular Rate and Rhythm without murmur Abdomen: Soft, non-tender, non-distended, no mass, no scars Skin: No rash Extremity Pulses:  2+ radial,  dorsalis pedis,  pulses bilaterally Musculoskeletal: No deformity or edema  Neurologic: Upper and lower extremity motor grossly intact, weak LE secondary to chronic edema.      Assessment: COMBINED CHRONIC VENOUS INSUFFICIENCY AND LYMPHEDEMA Deflated blister right anterior LE without signs of infection.  Plan: Continue with compression wraps and Elevation daily.  She  is unable to exercise as the swelling limits her mobility.  She is unable to do manual lymphatic drainage techniques because of her mobility.  She is unable to apply compression dressings.  For all these reasons I think she would be a good  candidate for the Flexitouch Pneumatic compression system.  She was previously in the process of trying to obtain this however she had a bowel perforation and this was then put on hold after she had a right lower extremity DVT which was new.  Per Dr. Nicole Cella last assessment:  Given that she has a contraindication to heparin with bilateral lower extremity DVTs do not have any plans to remove the filter  She states her PCP has ordered the device and now she has to wait 1 month.  Plan for her to f/u in 6 months for abdominal x ray to check her IVC filter.     Roxy Horseman PA-C [facility] [place] Office: [phone]  MD in clinic [place]

## 2018-10-27 ENCOUNTER — Telehealth: Payer: Self-pay | Admitting: Cardiovascular Disease

## 2018-10-27 NOTE — Telephone Encounter (Signed)
Returned call to patient - is concerned that people taking care of her leg thinks she needs to be on fluid medication.  Legs are oozing & weeping - they are wrapped now - waiting to get special boots covered by her insurance.  Explained to her that she was only seeing Dr. Bronson Ing back as needed per her last OV.  Vascular is taking care of her legs now.  Suggested she follow their recommendations in regards to that since they are managing.  She verbalized understanding.

## 2018-10-27 NOTE — Telephone Encounter (Signed)
Would like to know if she should taking medication for fluid

## 2018-12-23 NOTE — Progress Notes (Signed)
[facility] Note  01/05/2019     CHIEF COMPLAINT Patient presents for Retina Follow Up   HISTORY OF PRESENT ILLNESS: Regina Hall is a 84 y.o. female who presents to the clinic today for:   HPI    Retina Follow Up    Patient presents with  Wet AMD.  In right eye.  Severity is moderate.  Duration of 12 weeks.  Since onset it is stable.  I, the attending physician,  performed the HPI with the patient and updated documentation appropriately.          Comments    Pt states she feels like she needs new glasses bc she can't see as well, she denies flashes, floaters and eye pain, pt is using pilocarpine BID per Dr. [name]        Last edited by [name], MD on 01/05/2019 10:05 AM. (History)    pt states she is doing okay, pt is still using pilo drops from Dr. [name]   Referring physician: [name], DO [place],  [place] [address]  HISTORICAL INFORMATION:   Selected notes from the MEDICAL RECORD NUMBER Referred by Dr. Vevelyn Royals for concern of ARMD LEE: ([name]) [BCVA: OD: OS:] Ocular Hx-pseudo OU, UL reconstuction (Dr. [name], 02.24.20) PMH-anxiety, arthritis, cancer, HTN, scoliosis   CURRENT MEDICATIONS: Current Outpatient Medications (Ophthalmic Drugs)  Medication Sig  . pilocarpine (PILOCAR) 1 % ophthalmic solution 1 drop 2 (two) times daily.  Marland Kitchen Propylene Glycol-Glycerin (SOOTHE) 0.6-0.6 % SOLN Place 2 drops into both eyes 4 (four) times daily. And as needed   No current facility-administered medications for this visit. (Ophthalmic Drugs)   Current Outpatient Medications (Other)  Medication Sig  . losartan (COZAAR) 25 MG tablet Take 0.5 tablets (12.5 mg total) by mouth daily.  . montelukast (SINGULAIR) 10 MG tablet TAKE 1 TABLET(10 MG) BY MOUTH EVERY MORNING  . Biotin 1 MG CAPS Take by mouth.  . bumetanide (BUMEX) 1 MG tablet bumetanide 1 mg tablet  . cephALEXin (KEFLEX) 500 MG capsule  cephalexin 500 mg capsule  . Cholecalciferol (VITAMIN D3) 5000 units TABS Take 5,000 Units by mouth daily.  . Cranberry 450 MG CAPS Take 1 capsule by mouth daily.   Marland Kitchen docusate sodium (COLACE) 100 MG capsule Take 100 mg by mouth daily.  . Glucosamine Sulfate 500 MG TABS Take 2 tablets by mouth daily.   . magnesium hydroxide (MILK OF MAGNESIA) 400 MG/5ML suspension Take 30 mLs by mouth daily as needed for mild constipation.  . Multiple Vitamin (MULTIVITAMIN) tablet Take 1 tablet by mouth daily.  . naproxen (NAPROSYN) 500 MG tablet naproxen 500 mg tablet  Take 1 tablet twice a day by oral route.  . NON FORMULARY Diet Type:  NAS  . Nutritional Supplements (ENSURE ENLIVE PO) Take 1 Bottle by mouth 3 (three) times daily between meals.  . ondansetron (ZOFRAN) 4 MG tablet TK 1 T PO D PRF NAUSEA  . oxyCODONE-acetaminophen (PERCOCET/ROXICET) 5-325 MG tablet TK 1 T  PO D PRF PAIN  . pantoprazole (PROTONIX) 40 MG tablet Take 1 tablet (40 mg total) by mouth 2 (two) times daily. (Patient not taking: Reported on 01/05/2019)  . potassium chloride SA (K-DUR,KLOR-CON) 20 MEQ tablet Take 40 mEq by mouth daily.  . psyllium (REGULOID) 0.52 g capsule Take 0.52 g by mouth daily.  . silver sulfADIAZINE (SILVADENE) 1 % cream silver sulfadiazine 1 % topical cream  APP TOPICALLY TO AFFECTED SKIN D  UTD  . triamcinolone cream (KENALOG) 0.1 % APP AA BID PRN  . vitamin B-12 (CYANOCOBALAMIN) 500 MCG tablet Take 500 mcg by mouth daily.   No current facility-administered medications for this visit. (Other)      REVIEW OF SYSTEMS: ROS    Positive for: Eyes   Negative for: Constitutional, Gastrointestinal, Neurological, Skin, Genitourinary, Musculoskeletal, HENT, Endocrine, Cardiovascular, Respiratory, Psychiatric, Allergic/Imm, Heme/Lymph   Last edited by Debbrah Alar, COT on 01/05/2019  8:59 AM. (History)       ALLERGIES Allergies  Allergen Reactions  . Penicillins Rash, Other (See Comments) and Hives    Has  patient had a PCN reaction causing immediate rash, facial/tongue/throat swelling, SOB or lightheadedness with hypotension: ###Yes## Has patient had a PCN reaction causing severe rash involving mucus membranes or skin necrosis: No Has patient had a PCN reaction that required hospitalization No Has patient had a PCN reaction occurring within the last 10 years: No If all of the above answers are "NO", then may proceed with Cephalosporin use.   . Vancomycin Shortness Of Breath and Itching    PAST MEDICAL HISTORY Past Medical History:  Diagnosis Date  . Anemia   . Anxiety    pt denies  . Arthritis    KNEES AND BACK  . Bladder incontinence   . Cancer ([place])    basal cell cancer on nose  . Edema of both feet   . Hypertension   . Hypertensive retinopathy    OU  . Macular degeneration    Wet OD, Dry OS  . Pneumonia   . PONV (postoperative nausea and vomiting)    PONV X 1 EPISODE, COMES OUT OF ANESTHESIA FAST, SOME AWARENESS DURING END OF COLONSCOPY  . Scoliosis   . VRE (vancomycin resistant enterococcus) culture positive    Past Surgical History:  Procedure Laterality Date  . ABDOMINAL HYSTERECTOMY     2006 VAG HYST  . ABDOMINAL SURGERY    . APPLICATION OF WOUND VAC  03/10/2018   Procedure: APPLICATION OF WOUND VAC;  Surgeon: [name], MD;  Location: [facility];  Service: General;;  . BACK SURGERY     LOWER, X2  . BIOPSY Right 05/02/2017   Procedure: BIOPSY OF RIGHT UPPER EYELID LESION;  Surgeon: [name], MD;  Location: [facility];  Service: Ophthalmology;  Laterality: Right;  . CATARACT EXTRACTION Bilateral   . COLECTOMY WITH COLOSTOMY CREATION/HARTMANN PROCEDURE N/A 02/28/2018   Procedure: PARTIAL COLECTOMY WITH COLOSTOMY;  Surgeon: [name], MD;  Location: [facility];  Service: General;  Laterality: N/A;  . EYE SURGERY Bilateral    cataract surgery with lens implant  . FLEXIBLE SIGMOIDOSCOPY N/A 02/21/2018   Procedure: FLEXIBLE SIGMOIDOSCOPY;  Surgeon: [name],  MD;  [facility];  Service: Endoscopy;  Laterality: N/A;  . JOINT REPLACEMENT     2011 LT HIP  . LAPAROTOMY N/A 03/10/2018   Procedure: EXPLORATORY LAPAROTOMY;  Surgeon: [name], MD;  Location: [facility];  Service: General;  Laterality: N/A;  . POLYPECTOMY  02/21/2018   Procedure: POLYPECTOMY;  Surgeon: [name], MD;  [facility];  Service: Endoscopy;;  rectum  . RECONSTRUCTION OF EYELID Right 05/02/2017   Procedure: TOTAL RECONSTRUCTION OF UPPER EYELID RIGHT EYE WITH FULL THICKNESS SKIN GRAFT FROM RIGHT POSTERIOR EAR;  Surgeon: [name], MD;  Location: [facility];  Service: Ophthalmology;  Laterality: Right;  . SECONDARY CLOSURE OF WOUND  03/10/2018   Procedure: SECONDARY CLOSURE OF WOUND;  Surgeon:  [name], MD;  Location: [facility];  Service: General;;  . TONSILLECTOMY    . TOTAL KNEE ARTHROPLASTY Right 04/27/2014   Procedure: [place] TOTAL KNEE ARTHROPLASTY;  Surgeon: Rod Can, MD;  Location: [facility];  Service: Orthopedics;  Laterality: Right;  . TUBAL LIGATION     1974  . VENA CAVA FILTER PLACEMENT N/A 06/04/2017   Procedure: INSERTION VENA-CAVA FILTER;  Surgeon: Angelia Mould, MD;  Location: [facility];  Service: Vascular;  Laterality: N/A;    FAMILY HISTORY Family History  Problem Relation Age of Onset  . Congestive Heart Failure Mother   . Colon cancer Father   . Cancer Brother   . Arthritis Sister   . Asthma Sister   . Diabetes Son   . Diabetes Son     SOCIAL HISTORY Social History   Tobacco Use  . Smoking status: Never Smoker  . Smokeless tobacco: Never Used  Substance Use Topics  . Alcohol use: Yes    Comment: OCC WINE  . Drug use: No         OPHTHALMIC EXAM:  Base Eye Exam    Visual Acuity (Snellen - Linear)      Right Left   Dist [place] 20/50 20/50 +2   Dist ph [place] 20/40 -1 20/40 -2   Correction: Glasses       Tonometry (Tonopen, 9:09 AM)      Right Left   Pressure 11 13       Pupils      Dark Light Shape  React APD   Right 2 1.5 Round Minimal None   Left 2 1.5 Round Minimal None       Visual Fields (Counting fingers)      Left Right    Full Full       Extraocular Movement      Right Left    Full, Ortho Full, Ortho       Neuro/Psych    Oriented x3: Yes   Mood/Affect: Normal       Dilation    Both eyes: 1.0% Mydriacyl, 2.5% Phenylephrine @ 9:09 AM        Slit Lamp and Fundus Exam    Slit Lamp Exam      Right Left   Lids/Lashes Dermatochalasis - upper lid, Meibomian gland dysfunction, Telangiectasia Dermatochalasis - upper lid, Meibomian gland dysfunction, Telangiectasia   Conjunctiva/Sclera White and quiet White and quiet   Cornea 8 RK scars with central iron lines, mild Arcus 8 RK scars with central iron lines, mild Arcus   Anterior Chamber Deep and quiet Deep and quiet   Iris Round and dilated Round and dilated   Lens PC IOL in good position with open PC PC IOL in good position with open PC   Vitreous Vitreous syneresis, Posterior vitreous detachment Vitreous syneresis, Posterior vitreous detachment       Fundus Exam      Right Left   Disc Sharp rim, mild Pallor, Compact Compact, mild temporal pallor, sharp rim   C/D Ratio 0.3 0.2   Macula Flat, Blunted foveal reflex, Drusen, RPE mottling and clumping, shallow SRF, no heme - stable from prior,  Blunted foveal reflex, Retinal pigment epithelial mottling, Drusen, early central Atrophy with +cystic changes, No heme   Vessels Vascular attenuation Vascular attenuation   Periphery Attached, midzonal drusen Attached, midzonal drusen            IMAGING AND PROCEDURES  Imaging and Procedures for @TODAY @  OCT, Retina - OU -  Both Eyes       Right Eye Quality was good. Central Foveal Thickness: 303. Progression has been stable. Findings include intraretinal hyper-reflective material, no IRF, abnormal foveal contour, outer retinal atrophy, retinal drusen , pigment epithelial detachment, subretinal hyper-reflective material,  subretinal fluid (Trace Persistent SRF/SRHM).   Left Eye Quality was good. Central Foveal Thickness: 264. Progression has been stable. Findings include abnormal foveal contour, intraretinal fluid, outer retinal atrophy, retinal drusen , pigment epithelial detachment, intraretinal hyper-reflective material, no SRF, central retinal atrophy (Stable from prior).   Notes *Images captured and stored on drive  Diagnosis / Impression:  OD: exu ARMD -- persistent focal SRF/SRHM; no significant change from prior OS: non-exu ARMD with early central ORA - stable from prior   Clinical management:  See below  Abbreviations: NFP - Normal foveal profile. CME - cystoid macular edema. PED - pigment epithelial detachment. IRF - intraretinal fluid. SRF - subretinal fluid. EZ - ellipsoid zone. ERM - epiretinal membrane. ORA - outer retinal atrophy. ORT - outer retinal tubulation. SRHM - subretinal hyper-reflective material        Intravitreal Injection, Pharmacologic Agent - OD - Right Eye       Time Out 01/05/2019. 9:09 AM. Confirmed correct patient, procedure, site, and patient consented.   Anesthesia Topical anesthesia was used. Anesthetic medications included Lidocaine 2%, Proparacaine 0.5%.   Procedure Preparation included 5% betadine to ocular surface, eyelid speculum. A supplied needle was used.   Injection:  1.25 mg Bevacizumab (AVASTIN) SOLN   NDC: [id number], Lot: [id number] , Expiration date: 02/18/2019   Route: Intravitreal, Site: Right Eye, Waste: 0 mg  Post-op Post injection exam found visual acuity of at least counting fingers. The patient tolerated the procedure well. There were no complications. The patient received written and verbal post procedure care education.                 ASSESSMENT/PLAN:    ICD-10-CM   1. Exudative age-related macular degeneration of right eye with active choroidal neovascularization (HCC)  H35.3211 Intravitreal Injection, Pharmacologic  Agent - OD - Right Eye    Bevacizumab (AVASTIN) SOLN 1.25 mg  2. Retinal edema  H35.81 OCT, Retina - OU - Both Eyes  3. 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 Essential hypertension  I10   5. Hypertensive retinopathy of both eyes  H35.033   6. Pseudophakia of both eyes  Z96.1   7. History of radial keratotomy  Z98.890     1,2. Exudative age related macular degeneration, OD   - at presentation 06/23/2018 pt reported decreased vision OD for months -- started while in and out of hospital around January or Feb 2020  - s/p IVA OD #1 (06.08.20) #2 (07.07.20), #3 (10.06.20)  - OCT shows persistent SRF, essentially unchanged  - BCVA improved to 20/40 from 20/50  - recommend IVA OD #3 today (12.21.20)  - pt wishes to be treated with IVA  - RBA of procedure discussed, questions answered  - informed consent obtained and signed  - see procedure note  - f/u in 12 weeks, sooner prn -- DFE/OCT/possible injection  3. Age related macular degeneration, non-exudative, OS  - OCT shows central ORA with some mild cystic changes  - BCVA improved to 20/40 OS today  - pt not particularly bothered by vision OS  - Recommend amsler grid monitoring             - monitor  4,5.  Hypertensive retinopathy OU  - discussed importance of tight BP control  - monitor   6. Pseudophakia OU  - s/p CE/IOL OU (Dr. [name])  - beautiful surgeries, doing well  - monitor  7. History of Radial Keratotomy   - 8 cut RK OU  - central iron lines OU, but cornea stable  - moinitor    Ophthalmic Meds Ordered this visit:  Meds ordered this encounter  Medications  . Bevacizumab (AVASTIN) SOLN 1.25 mg       Return in about 12 weeks (around 03/30/2019) for f/u exu ARMD OD, DFE, OCT.  There are no Patient Instructions on file for this visit.   Explained the diagnoses, plan, and follow up with the patient and they expressed understanding.   Patient expressed understanding of the importance of proper follow up care.   This document serves as a record of services personally performed by Gardiner Sleeper, MD, PhD. It was created on their behalf by Leeann Must, [place], a certified ophthalmic assistant. The creation of this record is the provider's dictation and/or activities during the visit.    Electronically signed by: Leeann Must, COA @TODAY @ 1:06 PM   This document serves as a record of services personally performed by Gardiner Sleeper, MD, PhD. It was created on their behalf by Ernest Mallick, OA, an ophthalmic assistant. The creation of this record is the provider's dictation and/or activities during the visit.    Electronically signed by: Ernest Mallick, OA 12.21.2020 1:06 PM   Gardiner Sleeper, M.D., Ph.D. Diseases & Surgery of the Retina and Vitreous [facility]  I have reviewed the above documentation for accuracy and completeness, and I agree with the above. Gardiner Sleeper, M.D., Ph.D. 01/05/19 1:06 PM   Abbreviations: M myopia (nearsighted); A astigmatism; H hyperopia (farsighted); P presbyopia; Mrx spectacle prescription;  CTL contact lenses; OD right eye; OS left eye; OU both eyes  XT exotropia; ET esotropia; PEK punctate epithelial keratitis; PEE punctate epithelial erosions; DES dry eye syndrome; MGD meibomian gland dysfunction; ATs artificial tears; PFAT's preservative free artificial tears; [place] nuclear sclerotic cataract; PSC posterior subcapsular cataract; ERM epi-retinal membrane; PVD posterior vitreous detachment; RD retinal detachment; DM diabetes mellitus; DR diabetic retinopathy; NPDR non-proliferative diabetic retinopathy; PDR proliferative diabetic retinopathy; CSME clinically significant macular edema; DME diabetic macular edema; dbh dot blot hemorrhages; CWS cotton wool spot; POAG primary open angle glaucoma; C/D cup-to-disc ratio; HVF humphrey visual field; GVF goldmann visual field; OCT  optical coherence tomography; IOP intraocular pressure; BRVO Branch retinal vein occlusion; CRVO central retinal vein occlusion; CRAO central retinal artery occlusion; BRAO branch retinal artery occlusion; RT retinal tear; SB scleral buckle; PPV pars plana vitrectomy; VH Vitreous hemorrhage; PRP panretinal laser photocoagulation; IVK intravitreal kenalog; VMT vitreomacular traction; MH Macular hole;  NVD neovascularization of the disc; NVE neovascularization elsewhere; AREDS age related eye disease study; ARMD age related macular degeneration; POAG primary open angle glaucoma; EBMD epithelial/anterior basement membrane dystrophy; ACIOL anterior chamber intraocular lens; IOL intraocular lens; PCIOL posterior chamber intraocular lens; Phaco/IOL phacoemulsification with intraocular lens placement; [place] photorefractive keratectomy; LASIK laser assisted in situ keratomileusis; HTN hypertension; DM diabetes mellitus; COPD chronic obstructive pulmonary disease

## 2019-01-05 ENCOUNTER — Other Ambulatory Visit: Payer: Self-pay

## 2019-01-05 ENCOUNTER — Ambulatory Visit (INDEPENDENT_AMBULATORY_CARE_PROVIDER_SITE_OTHER): Payer: Medicare Other | Admitting: Ophthalmology

## 2019-01-05 ENCOUNTER — Encounter (INDEPENDENT_AMBULATORY_CARE_PROVIDER_SITE_OTHER): Payer: Self-pay | Admitting: Ophthalmology

## 2019-01-05 DIAGNOSIS — H353211 Exudative age-related macular degeneration, right eye, with active choroidal neovascularization: Secondary | ICD-10-CM | POA: Diagnosis not present

## 2019-01-05 DIAGNOSIS — Z961 Presence of intraocular lens: Secondary | ICD-10-CM

## 2019-01-05 DIAGNOSIS — H353124 Nonexudative age-related macular degeneration, left eye, advanced atrophic with subfoveal involvement: Secondary | ICD-10-CM

## 2019-01-05 DIAGNOSIS — I10 Essential (primary) hypertension: Secondary | ICD-10-CM | POA: Diagnosis not present

## 2019-01-05 DIAGNOSIS — H3581 Retinal edema: Secondary | ICD-10-CM | POA: Diagnosis not present

## 2019-01-05 DIAGNOSIS — H35033 Hypertensive retinopathy, bilateral: Secondary | ICD-10-CM

## 2019-01-05 DIAGNOSIS — Z9889 Other specified postprocedural states: Secondary | ICD-10-CM

## 2019-01-05 MED ORDER — BEVACIZUMAB CHEMO INJECTION 1.25MG/0.05ML SYRINGE FOR KALEIDOSCOPE
1.2500 mg | INTRAVITREAL | Status: AC | PRN
  Administered 2019-01-05: 1.25 mg via INTRAVITREAL

## 2019-03-09 ENCOUNTER — Telehealth: Payer: Self-pay

## 2019-03-09 ENCOUNTER — Telehealth: Payer: Self-pay | Admitting: Cardiovascular Disease

## 2019-03-09 MED ORDER — LOSARTAN POTASSIUM 25 MG PO TABS: 25.0000 mg | ORAL_TABLET | Freq: Every day | ORAL | 0 refills | Status: DC

## 2019-03-09 MED ORDER — LOSARTAN POTASSIUM 25 MG PO TABS: 12.5000 mg | ORAL_TABLET | Freq: Every day | ORAL | 1 refills | Status: DC

## 2019-03-09 NOTE — Telephone Encounter (Signed)
Patient contacted and rx for losartan correction already taken care of by [facility].

## 2019-03-09 NOTE — Telephone Encounter (Signed)
Called and spoke with pt about the change in her losartan 25mg  was changed to a whole tablet instead of half a tab daily, refill was sednt to [facility]

## 2019-03-09 NOTE — Telephone Encounter (Signed)
refill 

## 2019-03-09 NOTE — Telephone Encounter (Signed)
Patient called in regards to a prescription called into pharmacy today.

## 2019-03-24 NOTE — Progress Notes (Signed)
[facility] Note  03/30/2019     CHIEF COMPLAINT Patient presents for Retina Follow Up   HISTORY OF PRESENT ILLNESS: Regina Hall is a 85 y.o. female who presents to the clinic today for:   HPI    Retina Follow Up    Patient presents with  Wet AMD.  In right eye.  This started 12 weeks ago.  Severity is moderate.  I, the attending physician,  performed the HPI with the patient and updated documentation appropriately.          Comments    Patient here for 12 weeks retina follow up for exu ARMD OD. Patient states vision got new glasses. They weren't made right. Being remade. OD has pain sometimes.        Last edited by [name], MD on 03/30/2019  1:47 PM. (History)    pt states her vision is doing pretty good, she states when she looks at the eye chart with just her left eye the letters are on top of each other instead of being spaced out, pt uses AT's and pilo per Dr. [name]   Referring physician: [name], DO [place],  [place] [address]  HISTORICAL INFORMATION:   Selected notes from the MEDICAL RECORD NUMBER Referred by Dr. Vevelyn Royals for concern of ARMD LEE: ([name]) [BCVA: OD: OS:] Ocular Hx-pseudo OU, UL reconstuction (Dr. [name], 02.24.20) PMH-anxiety, arthritis, cancer, HTN, scoliosis   CURRENT MEDICATIONS: Current Outpatient Medications (Ophthalmic Drugs)  Medication Sig  . pilocarpine (PILOCAR) 1 % ophthalmic solution 1 drop 2 (two) times daily.  Marland Kitchen Propylene Glycol-Glycerin (SOOTHE) 0.6-0.6 % SOLN Place 2 drops into both eyes 4 (four) times daily. And as needed   No current facility-administered medications for this visit. (Ophthalmic Drugs)   Current Outpatient Medications (Other)  Medication Sig  . Biotin 1 MG CAPS Take by mouth.  . bumetanide (BUMEX) 1 MG tablet bumetanide 1 mg tablet  . cephALEXin (KEFLEX) 500 MG capsule cephalexin 500 mg capsule  . Cholecalciferol (VITAMIN D3) 5000  units TABS Take 5,000 Units by mouth daily.  . Cranberry 450 MG CAPS Take 1 capsule by mouth daily.   Marland Kitchen docusate sodium (COLACE) 100 MG capsule Take 100 mg by mouth daily.  . Glucosamine Sulfate 500 MG TABS Take 2 tablets by mouth daily.   Marland Kitchen losartan (COZAAR) 25 MG tablet Take 1 tablet (25 mg total) by mouth daily.  . magnesium hydroxide (MILK OF MAGNESIA) 400 MG/5ML suspension Take 30 mLs by mouth daily as needed for mild constipation.  . montelukast (SINGULAIR) 10 MG tablet TAKE 1 TABLET(10 MG) BY MOUTH EVERY MORNING  . Multiple Vitamin (MULTIVITAMIN) tablet Take 1 tablet by mouth daily.  . naproxen (NAPROSYN) 500 MG tablet naproxen 500 mg tablet  Take 1 tablet twice a day by oral route.  . NON FORMULARY Diet Type:  NAS  . Nutritional Supplements (ENSURE ENLIVE PO) Take 1 Bottle by mouth 3 (three) times daily between meals.  . ondansetron (ZOFRAN) 4 MG tablet TK 1 T PO D PRF NAUSEA  . oxyCODONE-acetaminophen (PERCOCET/ROXICET) 5-325 MG tablet TK 1 T  PO D PRF PAIN  . pantoprazole (PROTONIX) 40 MG tablet Take 1 tablet (40 mg total) by mouth 2 (two) times daily. (Patient not taking: Reported on 01/05/2019)  . potassium chloride SA (K-DUR,KLOR-CON) 20 MEQ tablet Take 40 mEq by mouth daily.  . psyllium (REGULOID) 0.52 g capsule Take 0.52 g by mouth  daily.  . silver sulfADIAZINE (SILVADENE) 1 % cream silver sulfadiazine 1 % topical cream  APP TOPICALLY TO AFFECTED SKIN D UTD  . triamcinolone cream (KENALOG) 0.1 % APP AA BID PRN  . vitamin B-12 (CYANOCOBALAMIN) 500 MCG tablet Take 500 mcg by mouth daily.   No current facility-administered medications for this visit. (Other)      REVIEW OF SYSTEMS: ROS    Positive for: Gastrointestinal, Genitourinary, Musculoskeletal, Eyes, Respiratory   Negative for: Constitutional, Neurological, Skin, HENT, Endocrine, Cardiovascular, Psychiatric, Allergic/Imm, Heme/Lymph   Last edited by [name], COA on 03/30/2019  1:28 PM. (History)        ALLERGIES Allergies  Allergen Reactions  . Penicillins Rash, Other (See Comments) and Hives    Has patient had a PCN reaction causing immediate rash, facial/tongue/throat swelling, SOB or lightheadedness with hypotension: ###Yes## Has patient had a PCN reaction causing severe rash involving mucus membranes or skin necrosis: No Has patient had a PCN reaction that required hospitalization No Has patient had a PCN reaction occurring within the last 10 years: No If all of the above answers are "NO", then may proceed with Cephalosporin use.   . Vancomycin Shortness Of Breath and Itching    PAST MEDICAL HISTORY Past Medical History:  Diagnosis Date  . Anemia   . Anxiety    pt denies  . Arthritis    KNEES AND BACK  . Bladder incontinence   . Cancer ([place])    basal cell cancer on nose  . Edema of both feet   . Hypertension   . Hypertensive retinopathy    OU  . Macular degeneration    Wet OD, Dry OS  . Pneumonia   . PONV (postoperative nausea and vomiting)    PONV X 1 EPISODE, COMES OUT OF ANESTHESIA FAST, SOME AWARENESS DURING END OF COLONSCOPY  . Scoliosis   . VRE (vancomycin resistant enterococcus) culture positive    Past Surgical History:  Procedure Laterality Date  . ABDOMINAL HYSTERECTOMY     2006 VAG HYST  . ABDOMINAL SURGERY    . APPLICATION OF WOUND VAC  03/10/2018   Procedure: APPLICATION OF WOUND VAC;  Surgeon: [name], MD;  Location: [facility];  Service: General;;  . BACK SURGERY     LOWER, X2  . BIOPSY Right 05/02/2017   Procedure: BIOPSY OF RIGHT UPPER EYELID LESION;  Surgeon: [name], MD;  Location: [facility];  Service: Ophthalmology;  Laterality: Right;  . CATARACT EXTRACTION Bilateral   . COLECTOMY WITH COLOSTOMY CREATION/HARTMANN PROCEDURE N/A 02/28/2018   Procedure: PARTIAL COLECTOMY WITH COLOSTOMY;  Surgeon: [name], MD;  Location: [facility];  Service: General;  Laterality: N/A;  . EYE SURGERY Bilateral    cataract surgery with lens  implant  . FLEXIBLE SIGMOIDOSCOPY N/A 02/21/2018   Procedure: FLEXIBLE SIGMOIDOSCOPY;  Surgeon: [name], MD;  [facility];  Service: Endoscopy;  Laterality: N/A;  . JOINT REPLACEMENT     2011 LT HIP  . LAPAROTOMY N/A 03/10/2018   Procedure: EXPLORATORY LAPAROTOMY;  Surgeon: [name], MD;  Location: [facility];  Service: General;  Laterality: N/A;  . POLYPECTOMY  02/21/2018   Procedure: POLYPECTOMY;  Surgeon: [name], MD;  [facility];  Service: Endoscopy;;  rectum  . RECONSTRUCTION OF EYELID Right 05/02/2017   Procedure: TOTAL RECONSTRUCTION OF UPPER EYELID RIGHT EYE WITH FULL THICKNESS SKIN GRAFT FROM RIGHT POSTERIOR EAR;  Surgeon: [name], MD;  Location: [facility];  Service: Ophthalmology;  Laterality: Right;  . SECONDARY CLOSURE OF WOUND  03/10/2018   Procedure: SECONDARY CLOSURE OF WOUND;  Surgeon: [name], MD;  Location: [facility];  Service: General;;  . TONSILLECTOMY    . TOTAL KNEE ARTHROPLASTY Right 04/27/2014   Procedure: [place] TOTAL KNEE ARTHROPLASTY;  Surgeon: Rod Can, MD;  Location: [facility];  Service: Orthopedics;  Laterality: Right;  . TUBAL LIGATION     1974  . VENA CAVA FILTER PLACEMENT N/A 06/04/2017   Procedure: INSERTION VENA-CAVA FILTER;  Surgeon: Angelia Mould, MD;  [facility];  Service: Vascular;  Laterality: N/A;    FAMILY HISTORY Family History  Problem Relation Age of Onset  . Congestive Heart Failure Mother   . Colon cancer Father   . Cancer Brother   . Arthritis Sister   . Asthma Sister   . Diabetes Son   . Diabetes Son     SOCIAL HISTORY Social History   Tobacco Use  . Smoking status: Never Smoker  . Smokeless tobacco: Never Used  Substance Use Topics  . Alcohol use: Yes    Comment: OCC WINE  . Drug use: No         OPHTHALMIC EXAM:  Base Eye Exam    Visual Acuity (Snellen - Linear)      Right Left   Dist [place] 20/25 20/40 -1   Dist ph [place] NI NI       Tonometry (Tonopen, 1:24  PM)      Right Left   Pressure 15 14       Pupils      Dark Light Shape React APD   Right 2 1.5 Round Minimal None   Left 2 1.5 Round Minimal None       Visual Fields (Counting fingers)      Left Right    Full Full       Extraocular Movement      Right Left    Full, Ortho Full, Ortho       Neuro/Psych    Oriented x3: Yes   Mood/Affect: Normal       Dilation    Both eyes: 1.0% Mydriacyl, 2.5% Phenylephrine @ 1:24 PM        Slit Lamp and Fundus Exam    Slit Lamp Exam      Right Left   Lids/Lashes Dermatochalasis - upper lid, Meibomian gland dysfunction, Telangiectasia Dermatochalasis - upper lid, Meibomian gland dysfunction, Telangiectasia   Conjunctiva/Sclera White and quiet White and quiet   Cornea 8 RK scars with central iron lines, mild Arcus 8 RK scars with central iron lines, mild Arcus   Anterior Chamber Deep and quiet Deep and quiet   Iris Round and dilated Round and dilated   Lens PC IOL in good position with open PC PC IOL in good position with open PC   Vitreous Vitreous syneresis, Posterior vitreous detachment Vitreous syneresis, Posterior vitreous detachment       Fundus Exam      Right Left   Disc Sharp rim, mild Pallor, Compact Compact, mild temporal pallor, sharp rim   C/D Ratio 0.3 0.2   Macula Flat, Blunted foveal reflex, Drusen, RPE mottling and clumping, shallow SRF overlying central PED, no heme - stable from prior Blunted foveal reflex, Retinal pigment epithelial mottling, Drusen, early central Atrophy with +cystic changes, focal dot heme superior macula   Vessels Vascular attenuation Vascular attenuation   Periphery Attached, midzonal drusen Attached, midzonal drusen  IMAGING AND PROCEDURES  Imaging and Procedures for @TODAY @  OCT, Retina - OU - Both Eyes       Right Eye Quality was good. Central Foveal Thickness: 297. Progression has been stable. Findings include intraretinal hyper-reflective material, no IRF, abnormal  foveal contour, outer retinal atrophy, retinal drusen , pigment epithelial detachment, subretinal hyper-reflective material, subretinal fluid (Mild Persistent SRF overlying PED).   Left Eye Quality was good. Central Foveal Thickness: 264. Progression has been stable. Findings include abnormal foveal contour, intraretinal fluid, outer retinal atrophy, retinal drusen , pigment epithelial detachment, intraretinal hyper-reflective material, no SRF, central retinal atrophy (Central ORA with overlying cystic changes - persistent; mild interval progression of atrophy).   Notes *Images captured and stored on drive  Diagnosis / Impression:  OD: exu ARMD -- persistent focal SRF/SRHM; no significant change from prior OS: non-exu ARMD with central ORA with overlying cystic changes - persistent - mild interval progression of atrophy  Clinical management:  See below  Abbreviations: NFP - Normal foveal profile. CME - cystoid macular edema. PED - pigment epithelial detachment. IRF - intraretinal fluid. SRF - subretinal fluid. EZ - ellipsoid zone. ERM - epiretinal membrane. ORA - outer retinal atrophy. ORT - outer retinal tubulation. SRHM - subretinal hyper-reflective material        Intravitreal Injection, Pharmacologic Agent - OD - Right Eye       Time Out 03/30/2019. 1:05 PM. Confirmed correct patient, procedure, site, and patient consented.   Anesthesia Topical anesthesia was used. Anesthetic medications included Lidocaine 2%, Proparacaine 0.5%.   Procedure Preparation included 5% betadine to ocular surface, eyelid speculum. A supplied needle was used.   Injection:  1.25 mg Bevacizumab (AVASTIN) SOLN   NDC: [id number], Lot: [id number] , Expiration date: 05/12/2019   Route: Intravitreal, Site: Right Eye, Waste: 0 mL  Post-op Post injection exam found visual acuity of at least counting fingers. The patient tolerated the procedure well. There were no complications. The patient received written  and verbal post procedure care education.                 ASSESSMENT/PLAN:    ICD-10-CM   1. Exudative age-related macular degeneration of right eye with active choroidal neovascularization (HCC)  H35.3211 Intravitreal Injection, Pharmacologic Agent - OD - Right Eye    Bevacizumab (AVASTIN) SOLN 1.25 mg  2. Retinal edema  H35.81 OCT, Retina - OU - Both Eyes  3. 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 Pseudophakia of both eyes  Z96.1   5. Hypertensive retinopathy of both eyes  H35.033   6. Essential hypertension  I10   7. History of radial keratotomy  Z98.890     1,2. Exudative age related macular degeneration, OD   - at presentation 06/23/2018 pt reported decreased vision OD for months -- started while in and out of hospital around January or Feb 2020  - s/p IVA OD #1 (06.08.20) #2 (07.07.20), #3 (10.06.20), #4 (12.21.20)  - OCT shows persistent SRF overlying PED, essentially unchanged  - BCVA improved to 20/25 from 20/40  - recommend IVA OD #5 today (03.15.21), maintenance  - pt wishes to be treated with IVA  - RBA of procedure discussed, questions answered  - informed consent obtained  - Avastin informed consent form signed and scanned on 12.21.2020  - see procedure note  - f/u in 12 weeks, sooner prn -- DFE/OCT/possible injection  3. Age related macular degeneration, non-exudative, OS  - OCT  shows central ORA with some mild cystic changes  - BCVA stable at 20/40 OS today  - pt not particularly bothered by vision OS  - Recommend amsler grid monitoring             - monitor  4,5. Hypertensive retinopathy OU  - discussed importance of tight BP control  - monitor   6. Pseudophakia OU  - s/p CE/IOL OU (Dr. [name])  - beautiful surgeries, doing well  - monitor  7. History of Radial Keratotomy   - 8 cut RK OU  - central iron lines OU, but cornea stable  - moinitor    Ophthalmic Meds  Ordered this visit:  Meds ordered this encounter  Medications  . Bevacizumab (AVASTIN) SOLN 1.25 mg       Return in about 12 weeks (around 06/22/2019) for f/u exu ARMD OD, DFE, OCT.  There are no Patient Instructions on file for this visit.   Explained the diagnoses, plan, and follow up with the patient and they expressed understanding.  Patient expressed understanding of the importance of proper follow up care.   This document serves as a record of services personally performed by Gardiner Sleeper, MD, PhD. It was created on their behalf by Leeann Must, [place], a certified ophthalmic assistant. The creation of this record is the provider's dictation and/or activities during the visit.    Electronically signed by: Leeann Must, COA @TODAY @ 2:21 PM   This document serves as a record of services personally performed by Gardiner Sleeper, MD, PhD. It was created on their behalf by Ernest Mallick, OA, an ophthalmic assistant. The creation of this record is the provider's dictation and/or activities during the visit.    Electronically signed by: Ernest Mallick, OA 03.15.2021 2:21 PM   Gardiner Sleeper, M.D., Ph.D. Diseases & Surgery of the Retina and Vitreous [facility]  I have reviewed the above documentation for accuracy and completeness, and I agree with the above. Gardiner Sleeper, M.D., Ph.D. 03/30/19 2:21 PM    Abbreviations: M myopia (nearsighted); A astigmatism; H hyperopia (farsighted); P presbyopia; Mrx spectacle prescription;  CTL contact lenses; OD right eye; OS left eye; OU both eyes  XT exotropia; ET esotropia; PEK punctate epithelial keratitis; PEE punctate epithelial erosions; DES dry eye syndrome; MGD meibomian gland dysfunction; ATs artificial tears; PFAT's preservative free artificial tears; [place] nuclear sclerotic cataract; PSC posterior subcapsular cataract; ERM epi-retinal membrane; PVD posterior vitreous detachment; RD retinal detachment; DM diabetes  mellitus; DR diabetic retinopathy; NPDR non-proliferative diabetic retinopathy; PDR proliferative diabetic retinopathy; CSME clinically significant macular edema; DME diabetic macular edema; dbh dot blot hemorrhages; CWS cotton wool spot; POAG primary open angle glaucoma; C/D cup-to-disc ratio; HVF humphrey visual field; GVF goldmann visual field; OCT optical coherence tomography; IOP intraocular pressure; BRVO Branch retinal vein occlusion; CRVO central retinal vein occlusion; CRAO central retinal artery occlusion; BRAO branch retinal artery occlusion; RT retinal tear; SB scleral buckle; PPV pars plana vitrectomy; VH Vitreous hemorrhage; PRP panretinal laser photocoagulation; IVK intravitreal kenalog; VMT vitreomacular traction; MH Macular hole;  NVD neovascularization of the disc; NVE neovascularization elsewhere; AREDS age related eye disease study; ARMD age related macular degeneration; POAG primary open angle glaucoma; EBMD epithelial/anterior basement membrane dystrophy; ACIOL anterior chamber intraocular lens; IOL intraocular lens; PCIOL posterior chamber intraocular lens; Phaco/IOL phacoemulsification with intraocular lens placement; [place] photorefractive keratectomy; LASIK laser assisted in situ keratomileusis; HTN hypertension; DM diabetes mellitus; COPD chronic obstructive pulmonary disease

## 2019-03-30 ENCOUNTER — Ambulatory Visit (INDEPENDENT_AMBULATORY_CARE_PROVIDER_SITE_OTHER): Payer: Medicare Other | Admitting: Ophthalmology

## 2019-03-30 ENCOUNTER — Encounter (INDEPENDENT_AMBULATORY_CARE_PROVIDER_SITE_OTHER): Payer: Self-pay | Admitting: Ophthalmology

## 2019-03-30 DIAGNOSIS — Z961 Presence of intraocular lens: Secondary | ICD-10-CM

## 2019-03-30 DIAGNOSIS — H353124 Nonexudative age-related macular degeneration, left eye, advanced atrophic with subfoveal involvement: Secondary | ICD-10-CM

## 2019-03-30 DIAGNOSIS — I10 Essential (primary) hypertension: Secondary | ICD-10-CM

## 2019-03-30 DIAGNOSIS — H3581 Retinal edema: Secondary | ICD-10-CM | POA: Diagnosis not present

## 2019-03-30 DIAGNOSIS — H353211 Exudative age-related macular degeneration, right eye, with active choroidal neovascularization: Secondary | ICD-10-CM

## 2019-03-30 DIAGNOSIS — Z9889 Other specified postprocedural states: Secondary | ICD-10-CM

## 2019-03-30 DIAGNOSIS — H35033 Hypertensive retinopathy, bilateral: Secondary | ICD-10-CM

## 2019-03-30 MED ORDER — BEVACIZUMAB CHEMO INJECTION 1.25MG/0.05ML SYRINGE FOR KALEIDOSCOPE
1.2500 mg | INTRAVITREAL | Status: AC | PRN
  Administered 2019-03-30: 1.25 mg via INTRAVITREAL

## 2019-05-13 ENCOUNTER — Ambulatory Visit (INDEPENDENT_AMBULATORY_CARE_PROVIDER_SITE_OTHER): Payer: Medicare Other | Admitting: Family Medicine

## 2019-05-13 ENCOUNTER — Encounter: Payer: Self-pay | Admitting: Family Medicine

## 2019-05-13 ENCOUNTER — Other Ambulatory Visit: Payer: Self-pay

## 2019-05-13 VITALS — BP 145/76 | HR 78 | Temp 97.5°F | Ht 65.0 in

## 2019-05-13 DIAGNOSIS — M4126 Other idiopathic scoliosis, lumbar region: Secondary | ICD-10-CM | POA: Insufficient documentation

## 2019-05-13 DIAGNOSIS — C801 Malignant (primary) neoplasm, unspecified: Secondary | ICD-10-CM

## 2019-05-13 DIAGNOSIS — G8929 Other chronic pain: Secondary | ICD-10-CM | POA: Insufficient documentation

## 2019-05-13 DIAGNOSIS — M545 Low back pain, unspecified: Secondary | ICD-10-CM

## 2019-05-13 DIAGNOSIS — M25512 Pain in left shoulder: Secondary | ICD-10-CM | POA: Diagnosis not present

## 2019-05-13 DIAGNOSIS — R109 Unspecified abdominal pain: Secondary | ICD-10-CM

## 2019-05-13 DIAGNOSIS — G894 Chronic pain syndrome: Secondary | ICD-10-CM | POA: Insufficient documentation

## 2019-05-13 DIAGNOSIS — Z933 Colostomy status: Secondary | ICD-10-CM

## 2019-05-13 DIAGNOSIS — R1310 Dysphagia, unspecified: Secondary | ICD-10-CM

## 2019-05-13 DIAGNOSIS — K5900 Constipation, unspecified: Secondary | ICD-10-CM

## 2019-05-13 DIAGNOSIS — Z8719 Personal history of other diseases of the digestive system: Secondary | ICD-10-CM

## 2019-05-13 DIAGNOSIS — J302 Other seasonal allergic rhinitis: Secondary | ICD-10-CM

## 2019-05-13 DIAGNOSIS — I10 Essential (primary) hypertension: Secondary | ICD-10-CM

## 2019-05-13 MED ORDER — MONTELUKAST SODIUM 10 MG PO TABS: 10.0000 mg | ORAL_TABLET | Freq: Every day | ORAL | 1 refills | Status: DC

## 2019-05-13 MED ORDER — POLYETHYLENE GLYCOL 3350 17 GM/SCOOP PO POWD: 17.0000 g | Freq: Every day | ORAL | 1 refills | Status: DC | PRN

## 2019-05-13 MED ORDER — LOSARTAN POTASSIUM 25 MG PO TABS: 25.0000 mg | ORAL_TABLET | Freq: Every day | ORAL | 1 refills | Status: DC

## 2019-05-13 MED ORDER — GABAPENTIN 100 MG PO CAPS: ORAL_CAPSULE | ORAL | 2 refills | Status: DC

## 2019-05-13 MED ORDER — DICYCLOMINE HCL 10 MG PO CAPS: 10.0000 mg | ORAL_CAPSULE | Freq: Three times a day (TID) | ORAL | 2 refills | Status: DC

## 2019-05-13 NOTE — Patient Instructions (Addendum)
Thank you for coming to the office today.  Start Gabapentin 100mg  capsules, take at night for 2-3 nights only, and then increase to 2 times a day for a few days, and then may increase to 3 times a day, it may make you drowsy, if helps significantly at night only, then you can increase instead to 3 capsules at night, instead of 3 times a day - In the future if needed, we can significantly increase the dose if tolerated well, some common doses are 300mg  three times a day up to 600mg  three times a day, usually it takes several weeks or months to get to higher doses  Dicyclomine for abdominal pain and cramping, can take up to 2-3 times a day, with meals if prefer. See if helpful.  For Constipation (less frequent bowel movement that can be hard dry or involve straining).  Recommend trying OTC Miralax 17g = 1 capful in large glass water once daily for now, try several days to see if working, goal is soft stool or BM 1-2 times daily, if too loose then reduce dose or try every other day. If not effective may need to increase it to 2 doses at once in AM or may do 1 in morning and 1 in afternoon/evening  - This medicine is very safe and can be used often without any problem and will not make you dehydrated. It is good for use on AS NEEDED BASIS or even MAINTENANCE therapy for longer term for several days to weeks at a time to help regulate bowel movements  Other more natural remedies or preventative treatment: - Increase hydration with water - Increase fiber in diet (high fiber foods = vegetables, leafy greens, oats/grains) - May take OTC Fiber supplement (metamucil powder or pill/gummy) - May try OTC Probiotic  ---------  Re ordered Losartan and Montelukast  We will work on referrals to Palliative Care ([facility]) they can come to the house soon and do an assessment and help [place] manage pain.  We will refer to Chronic Care Management - phone calls from - [place] to assist with  other care at home and equipment.  Also will likely refer to a GI specialist or Surgeon about the Colostomy  DUE for FASTING BLOOD WORK (no food or drink after midnight before the lab appointment, only water or coffee without cream/sugar on the morning of)  SCHEDULE "Lab Only" visit in the morning at the clinic for lab draw in [place]   - Make sure Lab Only appointment is at about 1 week before your next appointment, so that results will be available  For Lab Results, once available within 2-3 days of blood draw, you can can log in to MyChart online to view your results and a brief explanation. Also, we can discuss results at next follow-up visit.    Please schedule a Follow-up Appointment to: Return in about 4 weeks (around 06/10/2019) for 4 weeks follow-up Chronic Pain / Colostomy.  If you have any other questions or concerns, please feel free to call the office or send a message through [place]. You may also schedule an earlier appointment if necessary.  Additionally, you may be receiving a survey about your experience at our office within a few days to 1 week by e-mail or mail. We value your feedback.  Nobie Putnam, DO [place]

## 2019-05-13 NOTE — Progress Notes (Signed)
Subjective:    Patient ID: Regina Hall, female    DOB: 1935/01/01, 85 y.o.   MRN: [id number]  Regina Hall is a 85 y.o. female presenting on 05/13/2019 for Establish Care (HTN, leg and feet pain)  Moved from [place], about 5 weeks ago to live with son here near [place], [place]. Previous was followed by [facility] doctors in [place] (while still living in [place])  Here accompanied with son and grandson today.   HPI   Lymphedema lower extremity Previously used to have very severe lower extremity edema but has improved under care of her son who has assisted in improving elevation, compression, and applying moisturizer cream to dry skin on lower extremity has had improved edema overall.  Chronic Pain Syndrome Chronic Low Back Pain Failed Lumbar Spine Syndrome Complicated course, reviewed prior records from 2019, referencing past history of L4-L5 decompressive laminectomy in 2013 and 2017 had L4-L5 posterior interbody fusion by Dr Ellene Route, she has had prior imaging, injections, PT, and still has complicated problem with daily pain with pain and other areas of body, has history of radiating pain at times. - She has been on variety of medications in past including Opiate pain medication. She has had prior referral to Pain Management. No management for this recently. She has relocated now.   Hospitalization for Diverticulitis with bowel perforation / s/p Colectomy / Colostomy Serous Adenocarcinoma  Complicated hospital course in 02/2018, Ultimately had ruptured colon, and was managed in hospital at [facility] in [place], she had surgery on partial colectomy, then transitioned to a [place] after. Review of records shows she had colonoscopy done most recently 12/2018 via [facility] that is reported normal however I do not have access to this report. There was documentation of dx serous adenocarcinoma of colon during her hospital course in 02/2018 with diverticular perforation, identified  on surgical path report - for omentum resected tissue with high grade serous adenocarcinoma, however colon resected mucosa was serositis and benign lymph nodes. - Last seen by Gen Surgery [facility] (Dr Drue Flirt 01/2019) - She currently has Colostomy and is managing with this but prefers to have it reversed, ultimately was advised she is high risk and did not pursue this with [facility] back in 01/2019, they wanted to do a 2 step procedure. - She has constipation at times, not taking anything other than fiber - Admits chronic abdominal pain vs Left flank pain  Additional PMH History of provoked LLE DVT, s/p IVC filter, was not on anticoag due to surgeries  Additionally  GERD / Dysphagia Admits difficulty with swallowing, dysphagia. Usually needs to drink liquid with it. Off PPI Nutrition she takes Occasional Boost supplement     Depression screen [facility] 2/9 05/13/2019 02/20/2018 01/28/2018  Decreased Interest 0 0 0  Down, Depressed, Hopeless 0 1 0  PHQ - 2 Score 0 1 0    Past Medical History:  Diagnosis Date  . Anemia   . Anxiety    pt denies  . Arthritis    KNEES AND BACK  . Bladder incontinence   . Cancer ([place])    basal cell cancer on nose  . Edema of both feet   . Hypertension   . Hypertensive retinopathy    OU  . Macular degeneration    Wet OD, Dry OS  . Pneumonia   . PONV (postoperative nausea and vomiting)    PONV X 1 EPISODE, COMES OUT OF ANESTHESIA FAST, SOME AWARENESS DURING END OF  COLONSCOPY  . Scoliosis   . VRE (vancomycin resistant enterococcus) culture positive    Past Surgical History:  Procedure Laterality Date  . ABDOMINAL HYSTERECTOMY     2006 VAG HYST  . ABDOMINAL SURGERY    . APPLICATION OF WOUND VAC  03/10/2018   Procedure: APPLICATION OF WOUND VAC;  Surgeon: [name], MD;  Location: [facility];  Service: General;;  . BACK SURGERY     LOWER, X2  . BIOPSY Right 05/02/2017   Procedure: BIOPSY OF RIGHT UPPER EYELID LESION;  Surgeon: [name],  MD;  Location: [place];  Service: Ophthalmology;  Laterality: Right;  . CATARACT EXTRACTION Bilateral   . COLECTOMY WITH COLOSTOMY CREATION/HARTMANN PROCEDURE N/A 02/28/2018   Procedure: PARTIAL COLECTOMY WITH COLOSTOMY;  Surgeon: [name], MD;  Location: [facility];  Service: General;  Laterality: N/A;  . EYE SURGERY Bilateral    cataract surgery with lens implant  . FLEXIBLE SIGMOIDOSCOPY N/A 02/21/2018   Procedure: FLEXIBLE SIGMOIDOSCOPY;  Surgeon: [name], MD;  [facility];  Service: Endoscopy;  Laterality: N/A;  . JOINT REPLACEMENT     2011 LT HIP  . LAPAROTOMY N/A 03/10/2018   Procedure: EXPLORATORY LAPAROTOMY;  Surgeon: [name], MD;  Location: [facility];  Service: General;  Laterality: N/A;  . POLYPECTOMY  02/21/2018   Procedure: POLYPECTOMY;  Surgeon: [name], MD;  [facility];  Service: Endoscopy;;  rectum  . RECONSTRUCTION OF EYELID Right 05/02/2017   Procedure: TOTAL RECONSTRUCTION OF UPPER EYELID RIGHT EYE WITH FULL THICKNESS SKIN GRAFT FROM RIGHT POSTERIOR EAR;  Surgeon: [name], MD;  Location: [place];  Service: Ophthalmology;  Laterality: Right;  . SECONDARY CLOSURE OF WOUND  03/10/2018   Procedure: SECONDARY CLOSURE OF WOUND;  Surgeon: [name], MD;  Location: [facility];  Service: General;;  . TONSILLECTOMY    . TOTAL KNEE ARTHROPLASTY Right 04/27/2014   Procedure: [place] TOTAL KNEE ARTHROPLASTY;  Surgeon: Rod Can, MD;  Location: [facility];  Service: Orthopedics;  Laterality: Right;  . TUBAL LIGATION     1974  . VENA CAVA FILTER PLACEMENT N/A 06/04/2017   Procedure: INSERTION VENA-CAVA FILTER;  Surgeon: Angelia Mould, MD;  Location: [facility];  Service: Vascular;  Laterality: N/A;   Social History   Socioeconomic History  . Marital status: Married    Spouse name: Not on file  . Number of children: 3  . Years of education: 34  . Highest education level: Associate degree: occupational, Hotel manager, or vocational program    Occupational History  . Occupation: Retired    Comment: Corporate treasurer  Tobacco Use  . Smoking status: Never Smoker  . Smokeless tobacco: Never Used  Substance and Sexual Activity  . Alcohol use: Yes    Comment: OCC WINE  . Drug use: No  . Sexual activity: Not on file  Other Topics Concern  . Not on file  Social History Narrative  . Not on file   Social Determinants of Health   Financial Resource Strain:   . Difficulty of Paying Living Expenses:   Food Insecurity:   . Worried About Charity fundraiser in the Last Year:   . Arboriculturist in the Last Year:   Transportation Needs:   . Film/video editor (Medical):   Marland Kitchen Lack of Transportation (Non-Medical):   Physical Activity:   . Days of Exercise per Week:   . Minutes of Exercise per Session:   Stress:   .  Feeling of Stress :   Social Connections:   . Frequency of Communication with Friends and Family:   . Frequency of Social Gatherings with Friends and Family:   . Attends Religious Services:   . Active Member of Clubs or Organizations:   . Attends Archivist Meetings:   Marland Kitchen Marital Status:   Intimate Partner Violence:   . Fear of Current or Ex-Partner:   . Emotionally Abused:   Marland Kitchen Physically Abused:   . Sexually Abused:    Family History  Problem Relation Age of Onset  . Congestive Heart Failure Mother   . Colon cancer Father   . Cancer Brother   . Arthritis Sister   . Asthma Sister   . Diabetes Son   . Diabetes Son    Current Outpatient Medications on File Prior to Visit  Medication Sig  . Cholecalciferol (VITAMIN D3) 5000 units TABS Take 5,000 Units by mouth daily.  . Cranberry 450 MG CAPS Take 1 capsule by mouth daily.   . Multiple Vitamin (MULTIVITAMIN) tablet Take 1 tablet by mouth daily.  . Nutritional Supplements (ENSURE ENLIVE PO) Take 1 Bottle by mouth 3 (three) times daily between meals. Not every time --occ.  . pilocarpine (PILOCAR) 1 % ophthalmic solution 1 drop 2 (two) times  daily.  Marland Kitchen triamcinolone cream (KENALOG) 0.1 % APP AA BID PRN  . vitamin B-12 (CYANOCOBALAMIN) 500 MCG tablet Take 500 mcg by mouth daily.  . Biotin 1 MG CAPS Take by mouth.  . magnesium hydroxide (MILK OF MAGNESIA) 400 MG/5ML suspension Take 30 mLs by mouth daily as needed for mild constipation.  . psyllium (REGULOID) 0.52 g capsule Take 0.52 g by mouth daily.   No current facility-administered medications on file prior to visit.    Review of Systems Per HPI unless specifically indicated above      Objective:    BP (!) 145/76   Pulse 78   Temp (!) 97.5 F (36.4 C) (Temporal)   Ht 5\' 5"  (1.651 m)   SpO2 99%   BMI 32.78 kg/m   Wt Readings from Last 3 Encounters:  10/23/18 197 lb (89.4 kg)  09/01/18 188 lb (85.3 kg)  08/13/18 188 lb (85.3 kg)    Physical Exam Vitals and nursing note reviewed.  Constitutional:      General: She is not in acute distress.    Appearance: She is well-developed. She is obese. She is not diaphoretic.     Comments: Chronically ill 85 year old female mostly comfortable, cooperative, in wheelchair  HENT:     Head: Normocephalic and atraumatic.  Eyes:     General:        Right eye: No discharge.        Left eye: No discharge.     Conjunctiva/sclera: Conjunctivae normal.  Neck:     Thyroid: No thyromegaly.  Cardiovascular:     Rate and Rhythm: Normal rate and regular rhythm.     Heart sounds: Normal heart sounds. No murmur.  Pulmonary:     Effort: Pulmonary effort is normal. No respiratory distress.     Breath sounds: Normal breath sounds. No wheezing or rales.  Abdominal:     General: Bowel sounds are normal. There is no distension.     Palpations: Abdomen is soft.     Comments: L sided colostomy bag in place with normal appearing stool, no abnormal ostomy site  Musculoskeletal:        General: Normal range of motion.  Cervical back: Normal range of motion and neck supple.     Right lower leg: Edema (+1-2) present.     Left lower leg:  Edema (+1-2) present.     Comments: Limited spinal exam due to limited mobility in wheelchair Forward flexion does increase discomfort L>R low back Has palpable tender area with some muscle spasm Left low back paraspinal region  Lymphadenopathy:     Cervical: No cervical adenopathy.  Skin:    General: Skin is warm and dry.     Findings: No erythema or rash.  Neurological:     Mental Status: She is alert and oriented to person, place, and time.  Psychiatric:        Behavior: Behavior normal.     Comments: Well groomed, good eye contact, normal speech and thoughts    Results for orders placed or performed during the hospital encounter of 07/08/18  Comprehensive metabolic panel  Result Value Ref Range   Sodium 139 135 - 145 mmol/L   Potassium 3.3 (L) 3.5 - 5.1 mmol/L   Chloride 102 98 - 111 mmol/L   CO2 27 22 - 32 mmol/L   Glucose, Bld 96 70 - 99 mg/dL   BUN 24 (H) 8 - 23 mg/dL   Creatinine, Ser 1.35 (H) 0.44 - 1.00 mg/dL   Calcium 9.8 8.9 - 10.3 mg/dL   Total Protein 6.6 6.5 - 8.1 g/dL   Albumin 3.8 3.5 - 5.0 g/dL   AST 17 15 - 41 U/L   ALT 16 0 - 44 U/L   Alkaline Phosphatase 53 38 - 126 U/L   Total Bilirubin 0.9 0.3 - 1.2 mg/dL   GFR calc non Af Amer 37 (L) >60 mL/min   GFR calc Af Amer 43 (L) >60 mL/min   Anion gap 10 5 - 15  Lipase, blood  Result Value Ref Range   Lipase 33 11 - 51 U/L  CBC with Differential/Platelet  Result Value Ref Range   WBC 5.8 4.0 - 10.5 K/uL   RBC 4.24 3.87 - 5.11 MIL/uL   Hemoglobin 12.1 12.0 - 15.0 g/dL   HCT 38.7 36.0 - 46.0 %   MCV 91.3 80.0 - 100.0 fL   MCH 28.5 26.0 - 34.0 pg   MCHC 31.3 30.0 - 36.0 g/dL   RDW 14.2 11.5 - 15.5 %   Platelets 207 150 - 400 K/uL   nRBC 0.0 0.0 - 0.2 %   Neutrophils Relative % 48 %   Neutro Abs 2.9 1.7 - 7.7 K/uL   Lymphocytes Relative 35 %   Lymphs Abs 2.0 0.7 - 4.0 K/uL   Monocytes Relative 12 %   Monocytes Absolute 0.7 0.1 - 1.0 K/uL   Eosinophils Relative 4 %   Eosinophils Absolute 0.2 0.0 -  0.5 K/uL   Basophils Relative 1 %   Basophils Absolute 0.0 0.0 - 0.1 K/uL   Immature Granulocytes 0 %   Abs Immature Granulocytes 0.01 0.00 - 0.07 K/uL    02/28/18 Pathology from bowel surgery in hospital  Diagnosis 1. Omentum, resection for tumor - HIGH GRADE SEROUS CARCINOMA. - SEE COMMENT.  2. Colon, segmental resection, sigmoid - BENIGN COLORECTAL TYPE MUCOSA WITH TRANSMURAL DEFECT(S). - SEROSITIS. - THREE BENIGN LYMPH NODES (0/3). - THE SURGICAL RESECTION MARGINS ARE HISTOLOGICALLY VIABLE    Assessment & Plan:   Problem List Items Addressed This Visit    Serous adenocarcinoma [facility]) - Primary   Relevant Orders   Ambulatory referral to Chronic Care Management Services  Ambulatory referral to Gastroenterology   Other idiopathic scoliosis, lumbar region   Relevant Orders   Ambulatory referral to Chronic Care Management Services   Essential hypertension   Relevant Medications   losartan (COZAAR) 25 MG tablet   Other Relevant Orders   Ambulatory referral to Chronic Care Management Services   Colostomy in place [facility])   Relevant Orders   Ambulatory referral to Chronic Care Management Services   Ambulatory referral to Gastroenterology   Chronic pain syndrome   Relevant Medications   gabapentin (NEURONTIN) 100 MG capsule   Other Relevant Orders   Ambulatory referral to Chronic Care Management Services   Chronic bilateral low back pain without sciatica   Relevant Medications   gabapentin (NEURONTIN) 100 MG capsule    Other Visit Diagnoses    Chronic abdominal pain       Relevant Medications   gabapentin (NEURONTIN) 100 MG capsule   dicyclomine (BENTYL) 10 MG capsule   Seasonal allergies       Relevant Medications   montelukast (SINGULAIR) 10 MG tablet   Constipation, unspecified constipation type       Relevant Medications   polyethylene glycol powder (GLYCOLAX/MIRALAX) 17 GM/SCOOP powder   Other Relevant Orders   Ambulatory referral to Gastroenterology    History of diverticulitis of colon       Relevant Orders   Ambulatory referral to Gastroenterology   Dysphagia, unspecified type       Relevant Orders   Ambulatory referral to Gastroenterology      #Chronic Pain LBP / OA/DJD and Scoliosis Chronic Abdominal / Flank Pain Long history of chronic spinal back pain, s/p lumbar spine surgery x 2 in past 10+ years. With lumbar L4-5 surgeries and fusion S/p injections, pain management in past Failed variety of therapy Was on opiate therapy for period of time Today concerns with pain. She has transitioned several doctors in past 1-2 years, difficult to determine current regimen, seems to not have any treatment for her pain. She has significant frailty with multiple chronic co morbid conditions, not candidate for spine surgery - Will offer restart Gabapentin titration, dosing reviewed - For abdominal pain, can be related to constipation or history of bowel surgeries, can trial Dicyclomine PRN caution side effect  Will refer to [facility] for evaluation at home and consider further pain management options. Otherwise we could consider Pain Management referral but suspect interventions may be limited.  #Serous Adenocarcinoma S/p partial colectomy, colostomy / history diverticulitis w perforation History of serous adenocarcinoma 02/2018 - omentum surgical pathology report - see above  Complicated GI course over past 1 year, now is living with colostomy and some chronic functional bowel problems constipation and cramping/pain. May have issues from previous opiate therapy with [facility] as possibility as well.  She was evaluated by Gen Surg [facility] 01/2019 but ultimately it seems they did not pursue closure of ostomy, she has moved now and transitioning care.  Given finding of adenocarcinoma, her GI symptoms, Dysphagia - and may not be candidate for surgical closure of ostomy - will refer to local [place] preference location for  further assistance in coordinating her GI care. She may warrant future referral to General Surgery vs Oncology if indicated.  Also refer to CCM Case Manager to assist with transition to new specialists and help coordinate if any home care needs may be warranted in future.  OTC Miralax PRn constipation  Orders Placed This Encounter  Procedures  . Ambulatory referral to Chronic Care Management  Services    Referral Priority:   Routine    Referral Type:   Consultation    Referral Reason:   Care Coordination    Number of Visits Requested:   1  . Ambulatory referral to Gastroenterology    Referral Priority:   Routine    Referral Type:   Consultation    Referral Reason:   Specialty Services Required    Number of Visits Requested:   1     Meds ordered this encounter  Medications  . montelukast (SINGULAIR) 10 MG tablet    Sig: Take 1 tablet (10 mg total) by mouth at bedtime.    Dispense:  90 tablet    Refill:  1  . gabapentin (NEURONTIN) 100 MG capsule    Sig: Start 1 capsule daily, increase by 1 cap every 2-3 days as tolerated up to 3 times a day, or may take 3 at once in evening.    Dispense:  90 capsule    Refill:  2  . losartan (COZAAR) 25 MG tablet    Sig: Take 1 tablet (25 mg total) by mouth daily.    Dispense:  90 tablet    Refill:  1  . dicyclomine (BENTYL) 10 MG capsule    Sig: Take 1 capsule (10 mg total) by mouth 3 (three) times daily before meals. As needed for abdominal pain and cramping.    Dispense:  30 capsule    Refill:  2  . polyethylene glycol powder (GLYCOLAX/MIRALAX) 17 GM/SCOOP powder    Sig: Take 17 g by mouth daily as needed.    Dispense:  250 g    Refill:  1      Follow up plan: Return in about 4 weeks (around 06/10/2019) for 4 weeks follow-up Chronic Pain / Colostomy.  Nobie Putnam, [facility] 05/13/2019, 1:53 PM

## 2019-05-15 ENCOUNTER — Telehealth: Payer: Self-pay | Admitting: Primary Care

## 2019-05-15 NOTE — Telephone Encounter (Signed)
Spoke with patient's son Darrick Grinder, regarding Palliative services and he was in agreement with this.  I have scheduled an In-person Consult for 05/28/19 @ 9:30 AM.

## 2019-05-19 ENCOUNTER — Ambulatory Visit: Payer: Self-pay | Admitting: General Practice

## 2019-05-19 DIAGNOSIS — M549 Dorsalgia, unspecified: Secondary | ICD-10-CM

## 2019-05-19 DIAGNOSIS — G8929 Other chronic pain: Secondary | ICD-10-CM

## 2019-05-19 DIAGNOSIS — K5909 Other constipation: Secondary | ICD-10-CM

## 2019-05-19 DIAGNOSIS — M545 Low back pain: Secondary | ICD-10-CM

## 2019-05-19 DIAGNOSIS — K5900 Constipation, unspecified: Secondary | ICD-10-CM

## 2019-05-19 DIAGNOSIS — C801 Malignant (primary) neoplasm, unspecified: Secondary | ICD-10-CM

## 2019-05-19 DIAGNOSIS — I10 Essential (primary) hypertension: Secondary | ICD-10-CM

## 2019-05-19 DIAGNOSIS — Z933 Colostomy status: Secondary | ICD-10-CM

## 2019-05-19 DIAGNOSIS — I878 Other specified disorders of veins: Secondary | ICD-10-CM

## 2019-05-19 DIAGNOSIS — G894 Chronic pain syndrome: Secondary | ICD-10-CM

## 2019-05-19 NOTE — Patient Instructions (Signed)
Visit Information  Goals Addressed            This Visit's Progress   . RNCM: [name] son: "She is actually doing better since coming to live with me" (pt-stated)       [place] (see longitudinal plan of care for additional care plan information)  Current Barriers:  Marland Kitchen Knowledge Deficits related to incontinence needs and healing pressure ulcerations . 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 Lacks caregiver support. Son Octavia Bruckner is the primary caregiver of the patient . Transportation barriers . Patient unable to do ADLS/IADLS independently . Very limited mobility   Nurse Case Manager Clinical Goal(s):  Marland Kitchen Over the next 120 days, patient will verbalize understanding of plan for CCM team help and assistance in meeting patients health care and wellness needs . Over the next 120 days, patient will work with CCM team, pcp and specialist  to address needs related to omentum serous adenocarcinoma  . Over the next 120 days, patient will demonstrate a decrease in pressure ulcer and venous stasis  exacerbations as evidenced by healing pressure ulcerations and improved venous stasis to bilateral lower extremities  . Over the next 120 days, patient will attend all scheduled medical appointments: sees GI specialist on 5-24, pcp on 5-26 and [facility] is coming into the home for evaluation to start services  . Over the next 120 days, patient will demonstrate improved adherence to prescribed treatment plan for healing pressure ulcers and venous stasis  as evidenced bycontinued healing with no new break down, finding incontinence products to help with urinary incontinence and maintaining fluid balance  . Over  the next 120 days, patient will work with CM team pharmacist to polypharmacy and pain relief measures to help with chronic pain . Over the next 120 days, patient will work with CM clinical social worker to depression and anxiety and care giver strain. The patients son takes care of the patient and his autistic son in his home without any support or help . Over the next 120 days, patient will work with careguides  (community agency) to assist with alternate transportation and other resources in the community  Interventions:  . Inter-disciplinary care team collaboration (see longitudinal plan of care) . Evaluation of current treatment plan related to omentum serous adenocarcinoma and existing colostomy, urinary incontinence and chronic constipation  and patient's adherence to plan as established by provider. . Advised patient to let the RNCM know of any new needs or concerns before next outreach. Explained services and the CCM team being available to help with resources to help in the health and well being of the patient.  . Provided education to patient re: referrals being placed and follow up after research for incontinence needs  . Reviewed medications with patient and discussed compliance.  . Discussed plans with patient for ongoing care management follow up and provided patient with direct contact information for care management team . Provided patient and/or caregiver with care guide  information about transportation resources in the community and how they can help with other needs as well (community resource). . Evaluation of home health needs. The patient does not have a hospital bed and the son wishes she would try one but the patient sleeps in her reclining  chair and is not interested in a hospital bed. Has other equipment to use in the home. Is interested to see if there is a "pure wick" device to use in the home for urinary incontinence. The RNCM will do some research on this product that is  widely used in the hospital setting.  . Provided patient with incontinence and colostomy educational materials related to urinary incontinence, pressure ulcers and colostomy in place . Reviewed scheduled/upcoming provider appointments including:  . Care Guide referral for transportation resources  . Social Work referral for help with depression and care giver strain . Pharmacy referral for poly pharmacy and chronic pain management help  Patient Self Care Activities:  . Patient verbalizes understanding of plan to work with the CCM team to help with the chronic disease management and care coordination Needs of the patient with multiple co-morbidities  . Self administers medications as prescribed . Attends all scheduled provider appointments . Calls provider office for new concerns or questions . Unable to independently manage chronic conditions as evidence of having to move in with her son.  She is totally dependent on her son for meeting her needs.  . Unable to perform ADLs independently . Unable to perform IADLs independently  Initial goal documentation     . RNCM:-[name] son "I am fixing her food now" (pt-stated)       [place] (see longtitudinal plan of care for additional care plan information)  Current Barriers:  . Chronic Disease Management support, education, and care coordination needs related to Atrial Fibrillation, HTN, CKD Stage 3, and Chronic pain  Clinical Goal(s) related to Atrial Fibrillation, HTN, CKD Stage 3, and Chronic pain :  Over the next 120 days, patient will:  . Work with the care management team to address educational, disease management, and care coordination needs  . Begin or continue self health monitoring activities as directed today Measure and record blood pressure 2 times per week and adhere to a heart healthy diet . Call provider office for new or worsened signs and symptoms Blood pressure findings outside established parameters, Chest pain, Shortness  of breath, and New or worsened symptom related to Afib/CKD3 and other chronic conditions . Call care management team with questions or concerns . Verbalize basic understanding of patient centered plan of care established today  Interventions related to Atrial Fibrillation, HTN, CKD Stage 3, and chronic pain :  . Evaluation of current treatment plans and patient's adherence to plan as established by provider.  The son is a former paramedic and is the primary caregiver of the patient. He has a good understanding of the plan of care established by the provider . Assessed patient understanding of disease states.  The patient is doing much better since moving in with her son about 8 weeks ago. The son is working with pcp to get referrals in place to help with chronic conditions . Assessed patient's education and care coordination needs.  The patients son is asking for alternate transportation, incontinence help, best practices of caring for patient in his home, and anything that will help in the care of the patient. Referrals in place for care guide assistance. CCM team involvement. Upcoming appointments with the GI specialist and follow up with pcp . Provided disease specific education to patient .  Education on heart healthy diet, use of Ensure (will provide samples for the patient to pick up at next visit on 5/26).  Will send relevant information on dietary restrictions by my Chart and EMMI .  Collaborated with appropriate clinical care team members regarding patient needs.  Referrals in place for LCSW and Pharmacist  Patient Self Care Activities related to Atrial Fibrillation, HTN, CKD Stage 3, and Chronic pain :  . Patient is unable to independently self-manage chronic health conditions  Initial goal documentation        Ms. Bucker was given information about Chronic Care Management services today including:  1. CCM service includes personalized support from designated clinical staff supervised  by her physician, including individualized plan of care and coordination with other care providers 2. 24/7 contact phone numbers for assistance for urgent and routine care needs. 3. Service will only be billed when office clinical staff spend 20 minutes or more in a month to coordinate care. 4. Only one practitioner may furnish and bill the service in a calendar month. 5. The patient may stop CCM services at any time (effective at the end of the month) by phone call to the office staff. 6. The patient will be responsible for cost sharing (co-pay) of up to 20% of the service fee (after annual deductible is met).  Patient agreed to services and verbal consent obtained.   Patient verbalizes understanding of instructions provided today.   The care management team will reach out to the patient again over the next 30 days.   Noreene Larsson RN, MSN, [place] [facility] Mobile: [phone] Venous Ulcer A venous ulcer is a shallow sore on your lower leg. Venous ulcer is the most common type of lower leg ulcer. You may have venous ulcers on one leg or on both legs. This condition most often develops around your ankles. This type of ulcer may last for a long time (chronic ulcer) or it may return often (recurrent ulcer). What are the causes? This condition is caused by poor blood flow in your legs. The poor flow causes blood to pool in your legs. This can break the skin, causing an ulcer. What increases the risk? You are more likely to develop this condition if:  You are 55 years of age or older.  You are female.  You are overweight.  You are not active.  You have had a leg ulcer in the past.  You have varicose veins.  You have clots in your lower leg veins (deep vein thrombosis).  You have inflammation of your leg veins (phlebitis).  You have recently been pregnant.  You smoke. What are the signs or  symptoms? The main symptom of this condition is an open sore near your ankle. Other symptoms may include:  Swelling.  Thick skin.  Fluid coming from the ulcer.  Bleeding.  Itching.  Pain and swelling. This gets worse when you stand up and feels better when you raise your leg.  Blotchy skin.  Dark skin. How is this treated? This condition may be treated by:  Keeping your leg raised (elevated).  Wearing a type of bandage or stocking to keep pressure (compression) on the veins of your leg.  Taking medicines, including antibiotic medicines.  Cleaning your ulcer and removing any dead tissue from the wound.  Using bandages and wraps that have medicines in them to cover your ulcer.  Closing the wound using a piece of skin taken from another area of your body (graft). Follow these instructions at home: Medicines  Take or apply over-the-counter and prescription medicines only as told by your doctor.  If you were prescribed an antibiotic medicine,  take it as told by your doctor. Do not stop using the antibiotic even if you start to feel better.  Ask your doctor if you should take aspirin before long trips. Wound care  Follow instructions from your doctor about how to take care of your wound. Make sure you: ? Wash your hands with soap and water before and after you change your bandage (dressing). If you cannot use soap and water, use hand sanitizer. ? Change your bandage as told by your doctor. ? If you had a skin graft, leave stitches (sutures) in place. These may need to stay in place for 2 weeks or longer. ? Ask when you should remove your bandage. If your bandage is dry and sticks to your leg when you try to remove it, moisten or wet the bandage with saline solution or water to make it easier to remove.  Once your bandage is off, check your wound each day for signs of infection. Have a caregiver do this for you if you are not able to do it yourself. Check for: ? More  redness, swelling, or pain. ? More fluid or blood. ? Warmth. ? Pus or a bad smell. Activity  Do not sit for a long time without moving. Get up to take short walks every 1-2 hours. This is important. Ask for help if you feel weak or unsteady.  Ask your doctor what level of activity is safe for you.  Rest with your legs raised during the day. If you can, keep your legs above the level of your heart for 30 minutes, 3-4 times a day, or as told by your doctor.  Do not sit with your legs crossed. General instructions   Wear elastic stockings, compression stockings, or support hose as told by your doctor.  Raise the foot of your bed as told by your doctor.  Do not use any products that contain nicotine or tobacco, such as cigarettes, e-cigarettes, and chewing tobacco. If you need help quitting, ask your doctor.  Keep all follow-up visits as told by your doctor. This is important. Contact a doctor if:  Your ulcer is getting larger or is not healing.  Your pain gets worse. Get help right away if:  You have more redness, swelling, or pain around your ulcer.  You have more fluid or blood coming from your ulcer.  Your ulcer feels warm to the touch.  You have pus or a bad smell coming from your ulcer.  You have a fever. Summary  A venous ulcer is a shallow sore on your lower leg.  Follow instructions from your doctor about how to take care of your wound.  Check your wound each day for signs of infection.  Take over-the-counter and prescription medicines only as told by your doctor.  Keep all follow-up visits as told by your doctor. This is important. This information is not intended to replace advice given to you by your health care provider. Make sure you discuss any questions you have with your health care provider. Document Revised: 08/29/2017 Document Reviewed: 08/29/2017 Elsevier Patient Education  [place].  Urinary Incontinence  Urinary incontinence refers  to a condition in which a person is unable to control where and when to pass urine. A person with this condition will urinate when he or she does not mean to (involuntarily). What are the causes? This condition may be caused by:  Medicines.  Infections.  Constipation.  Overactive bladder muscles.  Weak bladder muscles.  Weak pelvic  floor muscles. These muscles provide support for the bladder, intestine, and, in women, the uterus.  Enlarged prostate in men. The prostate is a gland near the bladder. When it gets too big, it can pinch the urethra. With the urethra blocked, the bladder can weaken and lose the ability to empty properly.  Surgery.  Emotional factors, such as anxiety, stress, or post-traumatic stress disorder (PTSD).  Pelvic organ prolapse. This happens in women when organs shift out of place and into the vagina. This shift can prevent the bladder and urethra from working properly. What increases the risk? The following factors may make you more likely to develop this condition:  Older age.  Obesity and physical inactivity.  Pregnancy and childbirth.  Menopause.  Diseases that affect the nerves or spinal cord (neurological diseases).  Long-term (chronic) coughing. This can increase pressure on the bladder and pelvic floor muscles. What are the signs or symptoms? Symptoms may vary depending on the type of urinary incontinence you have. They include:  A sudden urge to urinate, but passing urine involuntarily before you can get to a bathroom (urge incontinence).  Suddenly passing urine with any activity that forces urine to pass, such as coughing, laughing, exercise, or sneezing (stress incontinence).  Needing to urinate often, but urinating only a small amount, or constantly dribbling urine (overflow incontinence).  Urinating because you cannot get to the bathroom in time due to a physical disability, such as arthritis or injury, or communication and thinking  problems, such as Alzheimer disease (functional incontinence). How is this diagnosed? This condition may be diagnosed based on:  Your medical history.  A physical exam.  Tests, such as: ? Urine tests. ? X-rays of your kidney and bladder. ? Ultrasound. ? CT scan. ? Cystoscopy. In this procedure, a health care provider inserts a tube with a light and camera (cystoscope) through the urethra and into the bladder in order to check for problems. ? Urodynamic testing. These tests assess how well the bladder, urethra, and sphincter can store and release urine. There are different types of urodynamic tests, and they vary depending on what the test is measuring. To help diagnose your condition, your health care provider may recommend that you keep a log of when you urinate and how much you urinate. How is this treated? Treatment for this condition depends on the type of incontinence that you have and its cause. Treatment may include:  Lifestyle changes, such as: ? Quitting smoking. ? Maintaining a healthy weight. ? Staying active. Try to get 150 minutes of moderate-intensity exercise every week. Ask your health care provider which activities are safe for you. ? Eating a healthy diet.  Avoid high-fat foods, like fried foods.  Avoid refined carbohydrates like white bread and white rice.  Limit how much alcohol and caffeine you drink.  Increase your fiber intake. Foods such as fresh fruits, vegetables, beans, and whole grains are healthy sources of fiber.  Pelvic floor muscle exercises.  Bladder training, such as lengthening the amount of time between bathroom breaks, or using the bathroom at regular intervals.  Using techniques to suppress bladder urges. This can include distraction techniques or controlled breathing exercises.  Medicines to relax the bladder muscles and prevent bladder spasms.  Medicines to help slow or prevent the growth of a man's prostate.  Botox injections. These  can help relax the bladder muscles.  Using pulses of electricity to help change bladder reflexes (electrical nerve stimulation).  For women, using a medical device to prevent  urine leaks. This is a small, tampon-like, disposable device that is inserted into the urethra.  Injecting collagen or carbon beads (bulking agents) into the urinary sphincter. These can help thicken tissue and close the bladder opening.  Surgery. Follow these instructions at home: Lifestyle  Limit alcohol and caffeine. These can fill your bladder quickly and irritate it.  Keep yourself clean to help prevent odors and skin damage. Ask your doctor about special skin creams and cleansers that can protect the skin from urine.  Consider wearing pads or adult diapers. Make sure to change them regularly, and always change them right after experiencing incontinence. General instructions  Take over-the-counter and prescription medicines only as told by your health care provider.  Use the bathroom about every 3-4 hours, even if you do not feel the need to urinate. Try to empty your bladder completely every time. After urinating, wait a minute. Then try to urinate again.  Make sure you are in a relaxed position while urinating.  If your incontinence is caused by nerve problems, keep a log of the medicines you take and the times you go to the bathroom.  Keep all follow-up visits as told by your health care provider. This is important. Contact a health care provider if:  You have pain that gets worse.  Your incontinence gets worse. Get help right away if:  You have a fever or chills.  You are unable to urinate.  You have redness in your groin area or down your legs. Summary  Urinary incontinence refers to a condition in which a person is unable to control where and when to pass urine.  This condition may be caused by medicines, infection, weak bladder muscles, weak pelvic floor muscles, enlargement of the  prostate (in men), or surgery.  The following factors increase your risk for developing this condition: older age, obesity, pregnancy and childbirth, menopause, neurological diseases, and chronic coughing.  There are several types of urinary incontinence. They include urge incontinence, stress incontinence, overflow incontinence, and functional incontinence.  This condition is usually treated first with lifestyle and behavioral changes, such as quitting smoking, eating a healthier diet, and doing regular pelvic floor exercises. Other treatment options include medicines, bulking agents, medical devices, electrical nerve stimulation, or surgery. This information is not intended to replace advice given to you by your health care provider. Make sure you discuss any questions you have with your health care provider. Document Revised: 01/11/2017 Document Reviewed: 04/12/2016 Elsevier Patient Education  [place], Adult  Colostomy surgery is done to create an opening in the front of the abdomen for stool (feces) to leave the body through an ostomy (stoma). Part of the large intestine is attached to the stoma. A bag, also called a pouch, is fitted over the stoma. Stool and gas will collect in the bag. After surgery, you will need to empty and change your colostomy bag as needed. You will also need to care for your stoma. How to care for the stoma Your stoma should look pink, red, and moist, like the inside of your cheek. Soon after surgery, the stoma may be swollen, but this swelling will go away within 6 weeks. To care for the stoma:  Keep the skin around the stoma clean and dry.  Use a clean, soft washcloth to gently wash the stoma and the skin around it. Clean using a circular motion, and wipe away from the stoma opening, not toward it. ? Use warm water and  only use cleansers recommended by your health care provider. ? Rinse the stoma area with plain water. ? Dry the  area around the stoma well.  Use stoma powder or ointment on your skin only as told by your health care provider. Do not use any other powders, gels, wipes, or creams on the skin around the stoma.  Check the stoma area every day for signs of infection. Check for: ? New or worsening redness, swelling, or pain. ? New or increased fluid or blood. ? Pus or warmth.  Measure the stoma opening regularly and record the size. Watch for changes. (It is normal for the stoma to get smaller as swelling goes away.) Share this information with your health care provider. How to empty the colostomy bag  Empty your bag at bedtime and whenever it is one-third to one-half full. Do not let the bag get more than half-full with stool or gas. The bag could leak if it gets too full. Some colostomy bags have a built-in gas release valve that releases gas often throughout the day. Follow these basic steps: 1. Wash your hands with soap and water. 2. Sit far back on the toilet seat. 3. Put several pieces of toilet paper into the toilet water. This will prevent splashing as you empty stool into the toilet. 4. Remove the clip or the hook-and-loop fastener from the tail end of the bag. 5. Unroll the tail, then empty the stool into the toilet. 6. Clean the tail with toilet paper or a moist towelette. 7. Reroll the tail, and close it with the clip or the hook-and-loop fastener. 8. Wash your hands again. How to change the colostomy bag Change your bag every 3-4 days or as often as told by your health care provider. Also change the bag if it is leaking or separating from the skin, or if your skin around the stoma looks or feels irritated. Irritated skin may be a sign that the bag is leaking. Always have colostomy supplies with you, and follow these basic steps: 1. Wash your hands with soap and water. Have paper towels or tissues nearby to clean any discharge. 2. Remove the old bag and skin barrier. Use your fingers or a warm  cloth to gently push the skin away from the barrier. 3. Clean the stoma area with water or with mild soap and water, as directed. Use water to rinse away any soap. 4. Dry the skin. You may use the cool setting on a hair dryer to do this. 5. Use a tracing pattern (template) to cut the skin barrier to the size needed. 6. If you are using a two-piece bag, attach the bag and the skin barrier to each other. Add the barrier ring, if you use one. 7. If directed, apply stoma powder or skin barrier gel to the skin. 8. Warm the skin barrier with your hands, or blow with a hair dryer for 5-10 seconds. 9. Remove the paper from the adhesive strip of the skin barrier. 10. Press the adhesive strip onto the skin around the stoma. 11. Gently rub the skin barrier onto the skin. This creates heat that helps the barrier to stick. 12. Apply stoma tape to the edges of the skin barrier, if desired. 61. Wash your hands again. General recommendations  Avoid wearing tight clothes or having anything press directly on your stoma or bag. Change your clothing whenever it is soiled or damp.  You may shower or bathe with the bag on or off. Do  not use harsh or oily soaps or lotions. Dry the skin and bag after bathing.  Store all supplies in a cool, dry place. Do not leave supplies in extreme heat because some parts can melt or not stick as well.  Whenever you leave home, take extra clothing and an extra skin barrier and bag with you.  If your bag gets wet, you can dry it with a hair dryer on the cool setting.  To prevent odor, you may put drops of ostomy deodorizer in the bag.  If recommended by your health care provider, put ostomy lubricant inside the bag. This helps stool to slide out of the bag more easily and completely. Contact a health care provider if:  You have new or worsening redness, swelling, or pain around your stoma.  You have new or increased fluid or blood coming from your stoma.  Your stoma feels  warm to the touch.  You have pus coming from your stoma.  Your stoma extends in or out farther than normal.  You need to change your bag every day.  You have a fever. Get help right away if:  Your stool is bloody.  You have nausea or you vomit.  You have trouble breathing. Summary  Measure your stoma opening regularly and record the size. Watch for changes.  Empty your bag at bedtime and whenever it is one-third to one-half full. Do not let the bag get more than half-full with stool or gas.  Change your bag every 3-4 days or as often as told by your health care provider.  Whenever you leave home, take extra clothing and an extra skin barrier and bag with you. This information is not intended to replace advice given to you by your health care provider. Make sure you discuss any questions you have with your health care provider. Document Revised: 04/23/2018 Document Reviewed: 06/27/2016 Elsevier Patient Education  [place].  Preventing Pressure Injuries  A pressure injury, sometimes called a bedsore or a pressure ulcer, is an injury to the skin and underlying tissue caused by pressure. A pressure injury can happen when your skin presses against a surface, such as a mattress or wheelchair seat, for too long. The pressure on the blood vessels causes reduced blood flow to your skin. This can eventually cause the skin tissue to die and break down into a wound. Pressure injuries usually develop:  Over bony parts of the body, such as the tailbone, shoulders, elbows, hips, and heels.  Under medical devices, such as respiratory equipment, stockings, tubes, and splints. How can this condition affect me? Pressure injuries are caused by a lack of blood supply to an area of skin. These injuries begin as a reddened area on the skin and can become an open sore. They can result from intense pressure over a short period of time or from less pressure over a long period of time. Pressure  injuries can vary in severity. They can cause pain, muscle damage, and infection. What can increase my risk? This condition is more likely to develop in people who:  Are in the hospital or an extended care facility.  Are bedridden or in a wheelchair.  Have an injury or disease that keeps them from: ? Moving normally. ? Feeling pain or pressure. ? Communicating if they feel pain or pressure.  Have a condition that: ? Makes them sleepy or less alert. ? Causes poor blood flow.  Need to wear a medical device.  Have poor control of  their bladder or bowel functions (incontinence).  Have poor nutrition (malnutrition).  Have had this condition before.  Are of certain ethnicities. People of African American, Latino, or Hispanic descent are at higher risk compared to other ethnic groups. What actions can I take to prevent pressure injuries? Reducing and redistributing pressure  Do not lie or sit in one position for a long time. Move or change position: ? Every hour when out of bed in a chair. ? Every two hours when in bed. ? As often as told by your health care provider.  Use pillows, wedges, or cushions to redistribute pressure. Ask your health care provider to recommend a mattress, cushions, or pads for you.  Use medical devices that do not rub your skin. Tell your health care provider if one of your medical devices is causing pain or irritation. Skin care If you are in the hospital, your health care providers:  Will inspect your skin, including areas under or around medical devices, at least twice a day.  May recommend that you use certain types of bedding to help prevent pressure injuries. These may include a pad, mattress, or chair cushion that is filled with gel, air, water, or foam.  Will evaluate your nutrition and consult a dietitian if needed.  Will inspect and change any wound dressings regularly.  May help you move into different positions every few hours.  Will  adjust any medical devices and braces as needed to limit pressure on your skin.  Will keep your skin clean and dry.  May use gentle cleansers and skin protectants if you are incontinent.  Will moisturize any dry skin. In general, at home:  Keep your skin clean and dry. Gently pat your skin dry.  Do not rub or massage bony areas of your skin.  Moisturize dry skin.  Use gentle cleansers and skin protectants routinely if you are incontinent.  Check your skin at least once a day for any changes in color and for any new blisters or sores. Make sure to check under and around any medical devices and between skin folds. Have a caregiver do this for you if you are not able.  Lifestyle  Be as active as you can every day. Ask your health care provider to suggest safe exercises or activities.  Do not abuse drugs or alcohol.  Do not use any products that contain nicotine or tobacco, such as cigarettes, e-cigarettes, and chewing tobacco. If you need help quitting, ask your health care provider. General instructions   Take over-the-counter and prescription medicines only as told by your health care provider.  Work with your health care provider to manage any chronic health conditions.  Eat a healthy diet that includes protein, vitamins, and minerals. Ask your health care provider what types of food you should eat.  Drink enough fluid to keep your urine pale yellow.  Keep all follow-up visits as told by your health care provider. This is important. Contact a health care provider if you:  Feel or see any changes in your skin. Summary  A pressure injury, sometimes called a bedsore or a pressure ulcer, is an injury to the skin and underlying tissue caused by pressure.  Do not lie or sit in one position for a long time.  Check your skin at least once a day for any changes in color and for any new blisters or sores.  Make sure to check under and around any medical devices and between skin  folds. Have a caregiver  do this for you if you are not able.  Eat a healthy diet that includes protein, vitamins, and minerals. Ask your health care provider what types of food you should eat. This information is not intended to replace advice given to you by your health care provider. Make sure you discuss any questions you have with your health care provider. Document Revised: 04/25/2018 Document Reviewed: 09/24/2017 Elsevier Patient Education  [place] DASH stands for "Dietary Approaches to Stop Hypertension." The DASH eating plan is a healthy eating plan that has been shown to reduce high blood pressure (hypertension). It may also reduce your risk for type 2 diabetes, heart disease, and stroke. The DASH eating plan may also help with weight loss. What are tips for following this plan?  General guidelines  Avoid eating more than 2,300 mg (milligrams) of salt (sodium) a day. If you have hypertension, you may need to reduce your sodium intake to 1,500 mg a day.  Limit alcohol intake to no more than 1 drink a day for nonpregnant women and 2 drinks a day for men. One drink equals 12 oz of beer, 5 oz of wine, or 1 oz of hard liquor.  Work with your health care provider to maintain a healthy body weight or to lose weight. Ask what an ideal weight is for you.  Get at least 30 minutes of exercise that causes your heart to beat faster (aerobic exercise) most days of the week. Activities may include walking, swimming, or biking.  Work with your health care provider or diet and nutrition specialist (dietitian) to adjust your eating plan to your individual calorie needs. Reading food labels   Check food labels for the amount of sodium per serving. Choose foods with less than 5 percent of the Daily Value of sodium. Generally, foods with less than 300 mg of sodium per serving fit into this eating plan.  To find whole grains, look for the word "whole" as the first word in  the ingredient list. Shopping  Buy products labeled as "low-sodium" or "no salt added."  Buy fresh foods. Avoid canned foods and premade or frozen meals. Cooking  Avoid adding salt when cooking. Use salt-free seasonings or herbs instead of table salt or sea salt. Check with your health care provider or pharmacist before using salt substitutes.  Do not fry foods. Cook foods using healthy methods such as baking, boiling, grilling, and broiling instead.  Cook with heart-healthy oils, such as olive, canola, soybean, or sunflower oil. Meal planning  Eat a balanced diet that includes: ? 5 or more servings of fruits and vegetables each day. At each meal, try to fill half of your plate with fruits and vegetables. ? Up to 6-8 servings of whole grains each day. ? Less than 6 oz of lean meat, poultry, or fish each day. A 3-oz serving of meat is about the same size as a deck of cards. One egg equals 1 oz. ? 2 servings of low-fat dairy each day. ? A serving of nuts, seeds, or beans 5 times each week. ? Heart-healthy fats. Healthy fats called Omega-3 fatty acids are found in foods such as flaxseeds and coldwater fish, like sardines, salmon, and mackerel.  Limit how much you eat of the following: ? Canned or prepackaged foods. ? Food that is high in trans fat, such as fried foods. ? Food that is high in saturated fat, such as fatty meat. ? Sweets, desserts, sugary drinks, and other foods  with added sugar. ? Full-fat dairy products.  Do not salt foods before eating.  Try to eat at least 2 vegetarian meals each week.  Eat more home-cooked food and less restaurant, buffet, and fast food.  When eating at a restaurant, ask that your food be prepared with less salt or no salt, if possible. What foods are recommended? The items listed may not be a complete list. Talk with your dietitian about what dietary choices are best for you. Grains Whole-grain or whole-wheat bread. Whole-grain or whole-wheat  pasta. Brown rice. [place]. Bulgur. Whole-grain and low-sodium cereals. Pita bread. Low-fat, low-sodium crackers. Whole-wheat flour tortillas. Vegetables Fresh or frozen vegetables (raw, steamed, roasted, or grilled). Low-sodium or reduced-sodium tomato and vegetable juice. Low-sodium or reduced-sodium tomato sauce and tomato paste. Low-sodium or reduced-sodium canned vegetables. Fruits All fresh, dried, or frozen fruit. Canned fruit in natural juice (without added sugar). Meat and other protein foods Skinless chicken or [place]. Ground chicken or [place]. Pork with fat trimmed off. Fish and seafood. Egg whites. Dried beans, peas, or lentils. Unsalted nuts, nut butters, and seeds. Unsalted canned beans. Lean cuts of beef with fat trimmed off. Low-sodium, lean deli meat. Dairy Low-fat (1%) or fat-free (skim) milk. Fat-free, low-fat, or reduced-fat cheeses. Nonfat, low-sodium ricotta or cottage cheese. Low-fat or nonfat yogurt. Low-fat, low-sodium cheese. Fats and oils Soft margarine without trans fats. Vegetable oil. Low-fat, reduced-fat, or light mayonnaise and salad dressings (reduced-sodium). Canola, safflower, olive, soybean, and sunflower oils. Avocado. Seasoning and other foods Herbs. Spices. Seasoning mixes without salt. Unsalted popcorn and pretzels. Fat-free sweets. What foods are not recommended? The items listed may not be a complete list. Talk with your dietitian about what dietary choices are best for you. Grains Baked goods made with fat, such as croissants, muffins, or some breads. Dry pasta or rice meal packs. Vegetables Creamed or fried vegetables. Vegetables in a cheese sauce. Regular canned vegetables (not low-sodium or reduced-sodium). Regular canned tomato sauce and paste (not low-sodium or reduced-sodium). Regular tomato and vegetable juice (not low-sodium or reduced-sodium). Angie Fava. Olives. Fruits Canned fruit in a light or heavy syrup. Fried fruit. Fruit in cream or  butter sauce. Meat and other protein foods Fatty cuts of meat. Ribs. Fried meat. Berniece Salines. Sausage. Bologna and other processed lunch meats. Salami. Fatback. Hotdogs. Bratwurst. Salted nuts and seeds. Canned beans with added salt. Canned or smoked fish. Whole eggs or egg yolks. Chicken or [place] with skin. Dairy Whole or 2% milk, cream, and half-and-half. Whole or full-fat cream cheese. Whole-fat or sweetened yogurt. Full-fat cheese. Nondairy creamers. Whipped toppings. Processed cheese and cheese spreads. Fats and oils Butter. Stick margarine. Lard. Shortening. Ghee. Bacon fat. Tropical oils, such as coconut, palm kernel, or palm oil. Seasoning and other foods Salted popcorn and pretzels. Onion salt, garlic salt, seasoned salt, table salt, and sea salt. Worcestershire sauce. Tartar sauce. Barbecue sauce. Teriyaki sauce. Soy sauce, including reduced-sodium. Steak sauce. Canned and packaged gravies. Fish sauce. Oyster sauce. Cocktail sauce. Horseradish that you find on the shelf. Ketchup. Mustard. Meat flavorings and tenderizers. Bouillon cubes. Hot sauce and Tabasco sauce. Premade or packaged marinades. Premade or packaged taco seasonings. Relishes. Regular salad dressings. Where to find more information:  National Heart, Lung, and [place]: https://wilson-eaton.com/  American Heart Association: www.heart.org Summary  The DASH eating plan is a healthy eating plan that has been shown to reduce high blood pressure (hypertension). It may also reduce your risk for type 2 diabetes, heart disease, and stroke.  With the  DASH eating plan, you should limit salt (sodium) intake to 2,300 mg a day. If you have hypertension, you may need to reduce your sodium intake to 1,500 mg a day.  When on the DASH eating plan, aim to eat more fresh fruits and vegetables, whole grains, lean proteins, low-fat dairy, and heart-healthy fats.  Work with your health care provider or diet and nutrition specialist (dietitian) to  adjust your eating plan to your individual calorie needs. This information is not intended to replace advice given to you by your health care provider. Make sure you discuss any questions you have with your health care provider. Document Revised: 12/14/2016 Document Reviewed: 12/26/2015 Elsevier Patient Education  2020 [employer].

## 2019-05-19 NOTE — Chronic Care Management (AMB) (Signed)
Chronic Care Management   Initial Visit Note  05/19/2019 Name: [name] MRN: [id number] DOB: [birth year]  Referred by: Regina Hauser, DO Reason for referral : Chronic Care Management (Initial Outreach: RNCM Chronic Disease Management and Care Coordiantion Needs)   Regina Hall is a 85 y.o. year old female who is a primary care patient of Regina Hauser, DO. The CCM team was consulted for assistance with chronic disease management and care coordination needs related to Atrial Fibrillation, HTN, CKD Stage Chronic pain, omentum serous adenocarcinoma  and chronic constipation  Review of patient status, including review of consultants reports, relevant laboratory and other test results, and collaboration with appropriate care team members and the patient's provider was performed as part of comprehensive patient evaluation and provision of chronic care management services.    SDOH (Social Determinants of Health) assessments performed: Yes See Care Plan activities for detailed interventions related to SDOH  SDOH Interventions     Most Recent Value  SDOH Interventions  SDOH Interventions for the Following Domains  Physical Activity, Social Connections  Physical Activity Interventions  Other (Comments) [limited mobility- chronic conditions impacting ability to exercise]  Social Connections Interventions  Other (Comment) [lives with son- he use to be a paramedic doing better]       Medications: Outpatient Encounter Medications as of 05/19/2019  Medication Sig  . Biotin 1 MG CAPS Take by mouth.  . Cholecalciferol (VITAMIN D3) 5000 units TABS Take 5,000 Units by mouth daily.  . Cranberry 450 MG CAPS Take 1 capsule by mouth daily.   Marland Kitchen dicyclomine (BENTYL) 10 MG capsule Take 1 capsule (10 mg total) by mouth 3 (three) times daily before meals. As needed for abdominal pain and cramping.  . gabapentin (NEURONTIN) 100 MG capsule Start 1 capsule daily, increase by 1 cap every  2-3 days as tolerated up to 3 times a day, or may take 3 at once in evening.  Marland Kitchen losartan (COZAAR) 25 MG tablet Take 1 tablet (25 mg total) by mouth daily.  . montelukast (SINGULAIR) 10 MG tablet Take 1 tablet (10 mg total) by mouth at bedtime.  . Multiple Vitamin (MULTIVITAMIN) tablet Take 1 tablet by mouth daily.  Marland Kitchen OVER THE COUNTER MEDICATION Take by mouth as needed (CBD gummies for pain relief). The patient does not take pain medications but the CBD gummies are used and help her with some pain relief  . OVER THE COUNTER MEDICATION Apply topically daily. Skin Protective paste used daily for healing bilateral buttocks ulcerations  . pilocarpine (PILOCAR) 1 % ophthalmic solution 1 drop 2 (two) times daily.  . polyethylene glycol powder (GLYCOLAX/MIRALAX) 17 GM/SCOOP powder Take 17 g by mouth daily as needed.  . psyllium (REGULOID) 0.52 g capsule Take 0.52 g by mouth daily.  Marland Kitchen triamcinolone cream (KENALOG) 0.1 % APP AA BID PRN  . vitamin B-12 (CYANOCOBALAMIN) 500 MCG tablet Take 500 mcg by mouth daily.  . magnesium hydroxide (MILK OF MAGNESIA) 400 MG/5ML suspension Take 30 mLs by mouth daily as needed for mild constipation.  . Nutritional Supplements (ENSURE ENLIVE PO) Take 1 Bottle by mouth 3 (three) times daily between meals. Not every time --occ.   No facility-administered encounter medications on file as of 05/19/2019.     Objective:  BP Readings from Last 3 Encounters:  05/13/19 (!) 145/76  10/23/18 (!) 171/82  09/01/18 138/78    Goals Addressed            This Visit's Progress   .  RNCM: [name] son: "She is actually doing better since coming to live with me" (pt-stated)       [place] (see longitudinal plan of care for additional care plan information)  Current Barriers:  Marland Kitchen Knowledge Deficits related to incontinence needs and healing pressure ulcerations . Care Coordination needs related to in home support and care in a patient with a colostomy, chronic constipation,  omentum serous adenocarcinoma and urinary incontience (disease states) . 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 Lacks caregiver support. Son Regina Hall is the primary caregiver of the patient . Transportation barriers . Patient unable to do ADLS/IADLS independently . Very limited mobility   Nurse Case Manager Clinical Goal(s):  Marland Kitchen Over the next 120 days, patient will verbalize understanding of plan for CCM team help and assistance in meeting patients health care and wellness needs . Over the next 120 days, patient will work with CCM team, pcp and specialist  to address needs related to omentum serous adenocarcinoma  . Over the next 120 days, patient will demonstrate a decrease in pressure ulcer and venous stasis  exacerbations as evidenced by healing pressure ulcerations and improved venous stasis to bilateral lower extremities  . Over the next 120 days, patient will attend all scheduled medical appointments: sees GI specialist on 5-24, pcp on 5-26 and [facility] is coming into the home for evaluation to start services  . Over the next 120 days, patient will demonstrate improved adherence to prescribed treatment plan for healing pressure ulcers and venous stasis  as evidenced bycontinued healing with no new break down, finding incontinence products to help with urinary incontinence and maintaining fluid balance  . Over the next 120 days, patient will work with CM team pharmacist to polypharmacy and pain relief measures to help with chronic pain . Over the next 120 days, patient will work with CM clinical social worker to depression and anxiety and care giver strain. The patients son takes care of the patient and his autistic son in his home without any support or help . Over the next 120 days, patient will work with careguides  (community agency) to assist with alternate transportation and  other resources in the community  Interventions:  . Inter-disciplinary care team collaboration (see longitudinal plan of care) . Evaluation of current treatment plan related to omentum serous adenocarcinoma and existing colostomy, urinary incontinence and chronic constipation  and patient's adherence to plan as established by provider. . Advised patient to let the RNCM know of any new needs or concerns before next outreach. Explained services and the CCM team being available to help with resources to help in the health and well being of the patient.  . Provided education to patient re: referrals being placed and follow up after research for incontinence needs  . Reviewed medications with patient and discussed compliance.  . Discussed plans with patient for ongoing care management follow up and provided patient with direct contact information for care management team . Provided patient and/or caregiver with care guide  information about transportation resources in the community and how they can help with other needs as well (community resource). . Evaluation of home health needs. The patient does not have a hospital bed and the son wishes she would try one but the patient sleeps in her reclining chair and is not interested in a hospital bed. Has other equipment to use in the home. Is interested to see  if there is a "pure wick" device to use in the home for urinary incontinence. The RNCM will do some research on this product that is widely used in the hospital setting.  . Provided patient with incontinence and colostomy educational materials related to urinary incontinence, pressure ulcers and colostomy in place . Reviewed scheduled/upcoming provider appointments including:  . Care Guide referral for transportation resources  . Social Work referral for help with depression and care giver strain . Pharmacy referral for poly pharmacy and chronic pain management help  Patient Self Care Activities:   . Patient verbalizes understanding of plan to work with the CCM team to help with the chronic disease management and care coordination Needs of the patient with multiple co-morbidities  . Self administers medications as prescribed . Attends all scheduled provider appointments . Calls provider office for new concerns or questions . Unable to independently manage chronic conditions as evidence of having to move in with her son.  She is totally dependent on her son for meeting her needs.  . Unable to perform ADLs independently . Unable to perform IADLs independently  Initial goal documentation     . RNCM:-[name] son "I am fixing her food now" (pt-stated)       [place] (see longtitudinal plan of care for additional care plan information)  Current Barriers:  . Chronic Disease Management support, education, and care coordination needs related to Atrial Fibrillation, HTN, CKD Stage 3, and Chronic pain  Clinical Goal(s) related to Atrial Fibrillation, HTN, CKD Stage 3, and Chronic pain :  Over the next 120 days, patient will:  . Work with the care management team to address educational, disease management, and care coordination needs  . Begin or continue self health monitoring activities as directed today Measure and record blood pressure 2 times per week and adhere to a heart healthy diet . Call provider office for new or worsened signs and symptoms Blood pressure findings outside established parameters, Chest pain, Shortness of breath, and New or worsened symptom related to Afib/CKD3 and other chronic conditions . Call care management team with questions or concerns . Verbalize basic understanding of patient centered plan of care established today  Interventions related to Atrial Fibrillation, HTN, CKD Stage 3, and chronic pain :  . Evaluation of current treatment plans and patient's adherence to plan as established by provider.  The son is a former paramedic and is the primary caregiver  of the patient. He has a good understanding of the plan of care established by the provider . Assessed patient understanding of disease states.  The patient is doing much better since moving in with her son about 8 weeks ago. The son is working with pcp to get referrals in place to help with chronic conditions . Assessed patient's education and care coordination needs.  The patients son is asking for alternate transportation, incontinence help, best practices of caring for patient in his home, and anything that will help in the care of the patient. Referrals in place for care guide assistance. CCM team involvement. Upcoming appointments with the GI specialist and follow up with pcp . Provided disease specific education to patient .  Education on heart healthy diet, use of Ensure (will provide samples for the patient to pick up at next visit on 5/26).  Will send relevant information on dietary restrictions by my Chart and EMMI . Collaborated with appropriate clinical care team members regarding patient needs.  Referrals in place for LCSW and Pharmacist  Patient  Self Care Activities related to Atrial Fibrillation, HTN, CKD Stage 3, and Chronic pain :  . Patient is unable to independently self-manage chronic health conditions  Initial goal documentation         Ms. Kempe was given information about Chronic Care Management services today including:  1. CCM service includes personalized support from designated clinical staff supervised by her physician, including individualized plan of care and coordination with other care providers 2. 24/7 contact phone numbers for assistance for urgent and routine care needs. 3. Service will only be billed when office clinical staff spend 20 minutes or more in a month to coordinate care. 4. Only one practitioner may furnish and bill the service in a calendar month. 5. The patient may stop CCM services at any time (effective at the end of the month) by phone call to  the office staff. 6. The patient will be responsible for cost sharing (co-pay) of up to 20% of the service fee (after annual deductible is met).  Patient agreed to services and verbal consent obtained.   Plan:   The care management team will reach out to the patient again over the next 30 days.   Noreene Larsson RN, MSN, [place] Mobile: [phone]

## 2019-05-28 ENCOUNTER — Other Ambulatory Visit: Payer: Self-pay

## 2019-05-28 ENCOUNTER — Other Ambulatory Visit: Payer: Medicare Other | Admitting: Primary Care

## 2019-05-28 ENCOUNTER — Telehealth: Payer: Self-pay

## 2019-05-28 DIAGNOSIS — Z515 Encounter for palliative care: Secondary | ICD-10-CM

## 2019-05-28 NOTE — Progress Notes (Signed)
[facility] Consult Note Telephone: [phone]  Fax: [phone]  PATIENT NAME: Regina Hall [address] [place] [address] [phone] (home)  DOB: [birth year] MRN: [id number]  PRIMARY CARE PROVIDER:    Olin Hauser, DO,  [place] [address] [phone]  REFERRING PROVIDER:   Olin Hauser, DO [address] [place],  [place] [address] [phone]  RESPONSIBLE PARTY:   Extended Emergency Contact Information Primary Emergency Contact: Regina Hall [place] Phone: [phone] Relation: Son Secondary Emergency Contact: Regina Hall Mobile Phone: [phone] Relation: Sister Interpreter needed? No  I met with patient and family in the home.  ASSESSMENT AND RECOMMENDATIONS:   1. Advance Care Planning/Goals of Care: Goals include to maximize quality of life and symptom management. Left MOST for their review and f/u on next visit. They are going to atty later today to finalize POA.  Our advance care planning conversation included a discussion about:   . The value and importance of advance care planning  . Exploration of personal, cultural or spiritual beliefs that might influence medical decisions  . Exploration of goals of care in the event of a sudden injury or illness  . Identification and preparation of a healthcare agent  . Review and update, or completion of an advance directive document   2. Symptom Management:   Constipation: States she was debilitated by by a colostomy 2 years ago. She had a ruptured colon due to constipation. Now has some constipation of stool but management with miralax. Recommend a half dose as sometimes it is too much when 17 gms are taken.  Nutrition: Eating fair, has eaten a lot fo candy in the past, but less intake now over all but more healthy. Wt is around 185 but requested them to check on home scale next  opportunity.  Edema: Has lymphedema pumps. .Use of pumps every several days.Skin is much improved with Regina Hall foot cream but is still very dry and scaly.  Incontinence: Very frequent urination, recommend treating for overactive bladder.   Vaccine: Refuses covid vaccine. Refuses flu vaccine. Refuses Shingrex.Education provided.   Pain: Discussed chronic uses of acetaminophen and anti depressants. States she does not want to take narcotics. Recommend an antidepressant, which helps with pain, insomnia, appetite and mentation. Recommend Acetaminophen CR 650 mg q 8 hrs.   3. Family /Caregiver/Community Supports: [place].  Caregiver strain due to careing for patient and teen son with autism. Recommend PCS if qualifies. She will need a revision of medicaid application to qualify most likely has she only has the women's health benefits currently.  4. Cognitive / Functional decline:  A and O x 1-2. Son reports significant sundowning.Needs help with all adls, mobility impaired.  5. Follow up Palliative Care Visit: Palliative care will continue to follow for goals of care clarification and symptom management. Return 4-6 weeks or prn.  I spent 60 minutes providing this consultation,  from 1000 to 1100. More than 50% of the time in this consultation was spent coordinating communication.   HISTORY OF PRESENT ILLNESS:  Regina Hall is a 85 y.o. year old female with multiple medical problems including incontinence, debility, skin breakdown, chronic pain, bowel managment. Palliative Care was asked to follow this patient by consultation request of Regina Hall * to help address advance care planning and goals  of care. This is a follow up visit.  CODE STATUS: TBD  PPS: 40% HOSPICE ELIGIBILITY/DIAGNOSIS: TBD  PAST MEDICAL HISTORY:  Past Medical History:  Diagnosis Date  . Anemia   . Anxiety    pt denies  . Arthritis    KNEES AND BACK  .  Bladder incontinence   . Cancer ([facility])    basal cell cancer on nose  . Edema of both feet   . Hypertension   . Hypertensive retinopathy    OU  . Macular degeneration    Wet OD, Dry OS  . Pneumonia   . PONV (postoperative nausea and vomiting)    PONV X 1 EPISODE, COMES OUT OF ANESTHESIA FAST, SOME AWARENESS DURING END OF COLONSCOPY  . Scoliosis   . VRE (vancomycin resistant enterococcus) culture positive     SOCIAL HX:  Social History   Tobacco Use  . Smoking status: Never Smoker  . Smokeless tobacco: Never Used  Substance Use Topics  . Alcohol use: Yes    Comment: OCC WINE    ALLERGIES:  Allergies  Allergen Reactions  . Hydrocodone-Acetaminophen Anaphylaxis  . Penicillins Rash, Other (See Comments) and Hives    Has patient had a PCN reaction causing immediate rash, facial/tongue/throat swelling, SOB or lightheadedness with hypotension: ###Yes## Has patient had a PCN reaction causing severe rash involving mucus membranes or skin necrosis: No Has patient had a PCN reaction that required hospitalization No Has patient had a PCN reaction occurring within the last 10 years: No If all of the above answers are "NO", then may proceed with Cephalosporin use.   . Vancomycin Shortness Of Breath and Itching     PERTINENT MEDICATIONS:  Outpatient Encounter Medications as of 05/28/2019  Medication Sig  . Biotin 1 MG CAPS Take by mouth.  . Cholecalciferol (VITAMIN D3) 5000 units TABS Take 5,000 Units by mouth daily.  . Cranberry 450 MG CAPS Take 1 capsule by mouth daily.   Marland Kitchen dicyclomine (BENTYL) 10 MG capsule Take 1 capsule (10 mg total) by mouth 3 (three) times daily before meals. As needed for abdominal pain and cramping.  . gabapentin (NEURONTIN) 100 MG capsule Start 1 capsule daily, increase by 1 cap every 2-3 days as tolerated up to 3 times a day, or may take 3 at once in evening.  Marland Kitchen losartan (COZAAR) 25 MG tablet Take 1 tablet (25 mg total) by mouth daily.  . magnesium hydroxide  (MILK OF MAGNESIA) 400 MG/5ML suspension Take 30 mLs by mouth daily as needed for mild constipation.  . montelukast (SINGULAIR) 10 MG tablet Take 1 tablet (10 mg total) by mouth at bedtime.  . Multiple Vitamin (MULTIVITAMIN) tablet Take 1 tablet by mouth daily.  . Nutritional Supplements (ENSURE ENLIVE PO) Take 1 Bottle by mouth 3 (three) times daily between meals. Not every time --occ.  Marland Kitchen OVER THE COUNTER MEDICATION Take by mouth as needed (CBD gummies for pain relief). The patient does not take pain medications but the CBD gummies are used and help her with some pain relief  . OVER THE COUNTER MEDICATION Apply topically daily. Skin Protective paste used daily for healing bilateral buttocks ulcerations  . pilocarpine (PILOCAR) 1 % ophthalmic solution 1 drop 2 (two) times daily.  . polyethylene glycol powder (GLYCOLAX/MIRALAX) 17 GM/SCOOP powder Take 17 g by mouth daily as needed.  . psyllium (REGULOID) 0.52 g capsule Take 0.52 g by mouth daily.  Marland Kitchen triamcinolone cream (KENALOG) 0.1 % APP AA BID PRN  . vitamin B-12 (  CYANOCOBALAMIN) 500 MCG tablet Take 500 mcg by mouth daily.   No facility-administered encounter medications on file as of 05/28/2019.    PHYSICAL EXAM / ROS:   Current and past weights: States weight has decreased, baseline was 185 lb.  General: NAD, frail appearing, obese Cardiovascular: no chest pain reported, 1-2+ edema  BIL Pulmonary: no cough, no increased SOB, room air Abdomen: appetite fair, states less than 50% baseline, endorses constipation, incontinent of bowel at times  GU: denies dysuria, incontinent of urine, urgency, urinates q 1 hr, urine clear, yellow and without odor. MSK:  + joint and ROM abnormalities, pivot transfers only Skin: buttock breakdown from pressure, DTI. Neurological: Weakness, dementia, chronic pain  Jason Coop, NP [facility]  COVID-19 PATIENT SCREENING TOOL  Person answering questions: ____________Son_______ _____   1.  Is the  patient or any family member in the home showing any signs or symptoms regarding respiratory infection?               Person with Symptom- __________NA_________________  a. Fever                                                                          Yes___ No___          ___________________  b. Shortness of breath                                                    Yes___ No___          ___________________ c. Cough/congestion                                       Yes___  No___         ___________________ d. Body aches/pains                                                         Yes___ No___        ____________________ e. Gastrointestinal symptoms (diarrhea, nausea)           Yes___ No___        ____________________  2. Within the past 14 days, has anyone living in the home had any contact with someone with or under investigation for COVID-19?    Yes___ No_X_   Person __________________

## 2019-05-28 NOTE — Telephone Encounter (Signed)
Copied from [place] [id number]. Topic: Referral - Status >> May 28, 2019  [id number] PM Simone Curia D wrote: [id number] Spoke with patient's son Hilda Lias about [employer] transportation and Museum/gallery curator for incontinence products and respite care.  Will follow-up with Tim next week. Ambrose Mantle [phone]

## 2019-06-02 ENCOUNTER — Telehealth: Payer: Self-pay

## 2019-06-02 NOTE — Telephone Encounter (Signed)
Copied from [place] [phone]. Topic: Referral - Status >> Jun 02, 2019  [id number] PM Simone Curia D wrote: [phone] Spoke with patient's son Darrick Grinder.  For now he is able to take his mother to her appointments, he has the number for Medicaid transportation if he decides to use them. He also plans to contact [facility] in the next few days for respite/caregiver resources and adult diapers.  No other resources are needed for this patient at this time.  Closing referral. Ambrose Mantle [phone]

## 2019-06-08 ENCOUNTER — Encounter: Payer: Self-pay | Admitting: *Deleted

## 2019-06-08 ENCOUNTER — Ambulatory Visit: Payer: Medicare Other | Admitting: Gastroenterology

## 2019-06-08 MED ORDER — OXYCODONE HCL 5 MG PO TABS
2.50 | ORAL_TABLET | ORAL | Status: DC
Start: ? — End: 2019-06-08

## 2019-06-08 MED ORDER — SALINE NASAL SPRAY 0.65 % NA SOLN
2.00 | NASAL | Status: DC
Start: ? — End: 2019-06-08

## 2019-06-08 MED ORDER — CARBOXYMETHYLCELLULOSE SODIUM 0.5 % OP SOLN
1.00 | OPHTHALMIC | Status: DC
Start: ? — End: 2019-06-08

## 2019-06-08 MED ORDER — GENERIC EXTERNAL MEDICATION
Status: DC
Start: ? — End: 2019-06-08

## 2019-06-08 MED ORDER — ACETAMINOPHEN 325 MG PO TABS
975.00 | ORAL_TABLET | ORAL | Status: DC
Start: 2019-06-10 — End: 2019-06-08

## 2019-06-08 MED ORDER — OINTMENT BASE EX OINT
TOPICAL_OINTMENT | CUTANEOUS | Status: DC
Start: 2019-06-15 — End: 2019-06-08

## 2019-06-08 MED ORDER — GENERIC EXTERNAL MEDICATION
40.00 | Status: DC
Start: 2019-06-09 — End: 2019-06-08

## 2019-06-08 MED ORDER — ONDANSETRON HCL 4 MG/2ML IJ SOLN
4.00 | INTRAMUSCULAR | Status: DC
Start: ? — End: 2019-06-08

## 2019-06-08 MED ORDER — VANCOMYCIN HCL IN NACL 1.25-0.9 GM/250ML-% IV SOLN
1.25 | INTRAVENOUS | Status: DC
Start: 2019-06-10 — End: 2019-06-08

## 2019-06-08 MED ORDER — ACETAMINOPHEN 325 MG PO TABS
650.00 | ORAL_TABLET | ORAL | Status: DC
Start: ? — End: 2019-06-08

## 2019-06-08 MED ORDER — LACTATED RINGERS IV SOLN
INTRAVENOUS | Status: DC
Start: ? — End: 2019-06-08

## 2019-06-08 MED ORDER — PILOCARPINE HCL 1 % OP SOLN
1.00 | OPHTHALMIC | Status: DC
Start: 2019-06-15 — End: 2019-06-08

## 2019-06-08 MED ORDER — DSS 100 MG PO CAPS
100.00 | ORAL_CAPSULE | ORAL | Status: DC
Start: 2019-06-15 — End: 2019-06-08

## 2019-06-08 MED ORDER — GENERIC EXTERNAL MEDICATION
3.38 | Status: DC
Start: 2019-06-09 — End: 2019-06-08

## 2019-06-08 MED ORDER — MONTELUKAST SODIUM 10 MG PO TABS
10.00 | ORAL_TABLET | ORAL | Status: DC
Start: 2019-06-15 — End: 2019-06-08

## 2019-06-08 MED ORDER — MELATONIN 3 MG PO TBDP
3.00 | ORAL_TABLET | ORAL | Status: DC
Start: 2019-06-15 — End: 2019-06-08

## 2019-06-09 ENCOUNTER — Ambulatory Visit (INDEPENDENT_AMBULATORY_CARE_PROVIDER_SITE_OTHER): Payer: Medicare Other | Admitting: Licensed Clinical Social Worker

## 2019-06-09 DIAGNOSIS — G894 Chronic pain syndrome: Secondary | ICD-10-CM

## 2019-06-09 DIAGNOSIS — I10 Essential (primary) hypertension: Secondary | ICD-10-CM | POA: Diagnosis not present

## 2019-06-09 DIAGNOSIS — M549 Dorsalgia, unspecified: Secondary | ICD-10-CM

## 2019-06-09 DIAGNOSIS — I878 Other specified disorders of veins: Secondary | ICD-10-CM

## 2019-06-09 MED ORDER — CARBOXYMETHYLCELLULOSE SODIUM 0.5 % OP SOLN
1.00 | OPHTHALMIC | Status: DC
Start: 2019-06-15 — End: 2019-06-09

## 2019-06-09 MED ORDER — PANTOPRAZOLE SODIUM 40 MG PO TBEC
40.00 | DELAYED_RELEASE_TABLET | ORAL | Status: DC
Start: 2019-06-16 — End: 2019-06-09

## 2019-06-09 MED ORDER — POLYETHYLENE GLYCOL 3350 17 GM/SCOOP PO POWD
17.00 | ORAL | Status: DC
Start: 2019-06-16 — End: 2019-06-09

## 2019-06-09 MED ORDER — GABAPENTIN 100 MG PO CAPS
200.00 | ORAL_CAPSULE | ORAL | Status: DC
Start: 2019-06-15 — End: 2019-06-09

## 2019-06-09 MED ORDER — HEPARIN SODIUM (PORCINE) 5000 UNIT/ML IJ SOLN
5000.00 | INTRAMUSCULAR | Status: DC
Start: 2019-06-15 — End: 2019-06-09

## 2019-06-09 NOTE — Chronic Care Management (AMB) (Signed)
Chronic Care Management    Clinical Social Work Follow Up Note  06/09/2019 Name: Regina Hall MRN: [id number] DOB: [birth year]  Regina Hall is a 85 y.o. year old female who is a primary care patient of Olin Hauser, DO. The CCM team was consulted for assistance with Level of Care Concerns.   Review of patient status, including review of consultants reports, other relevant assessments, and collaboration with appropriate care team members and the patient's provider was performed as part of comprehensive patient evaluation and provision of chronic care management services.    SDOH (Social Determinants of Health) assessments performed: Yes    Outpatient Encounter Medications as of 06/09/2019  Medication Sig  . Biotin 1 MG CAPS Take by mouth.  . Cholecalciferol (VITAMIN D3) 5000 units TABS Take 5,000 Units by mouth daily.  . Cranberry 450 MG CAPS Take 1 capsule by mouth daily.   Marland Kitchen dicyclomine (BENTYL) 10 MG capsule Take 1 capsule (10 mg total) by mouth 3 (three) times daily before meals. As needed for abdominal pain and cramping.  . gabapentin (NEURONTIN) 100 MG capsule Start 1 capsule daily, increase by 1 cap every 2-3 days as tolerated up to 3 times a day, or may take 3 at once in evening.  Marland Kitchen losartan (COZAAR) 25 MG tablet Take 1 tablet (25 mg total) by mouth daily.  . magnesium hydroxide (MILK OF MAGNESIA) 400 MG/5ML suspension Take 30 mLs by mouth daily as needed for mild constipation.  . montelukast (SINGULAIR) 10 MG tablet Take 1 tablet (10 mg total) by mouth at bedtime.  . Multiple Vitamin (MULTIVITAMIN) tablet Take 1 tablet by mouth daily.  . Nutritional Supplements (ENSURE ENLIVE PO) Take 1 Bottle by mouth 3 (three) times daily between meals. Not every time --occ.  Marland Kitchen OVER THE COUNTER MEDICATION Take by mouth as needed (CBD gummies for pain relief). The patient does not take pain medications but the CBD gummies are used and help her with some pain relief  . OVER THE  COUNTER MEDICATION Apply topically daily. Skin Protective paste used daily for healing bilateral buttocks ulcerations  . pilocarpine (PILOCAR) 1 % ophthalmic solution 1 drop 2 (two) times daily.  . polyethylene glycol powder (GLYCOLAX/MIRALAX) 17 GM/SCOOP powder Take 17 g by mouth daily as needed.  . psyllium (REGULOID) 0.52 g capsule Take 0.52 g by mouth daily.  Marland Kitchen triamcinolone cream (KENALOG) 0.1 % APP AA BID PRN  . vitamin B-12 (CYANOCOBALAMIN) 500 MCG tablet Take 500 mcg by mouth daily.   No facility-administered encounter medications on file as of 06/09/2019.     Goals Addressed    . SW: [name] son "My mother has been in the hospital for the last 5 days (pt-stated)       CARE PLAN ENTRY (see longitudinal plan of care for additional care plan information)  Current Barriers:  . Limited social support . ADL IADL limitations . Mental Health Concerns  . Social Isolation . Limited access to caregiver . Inability to perform ADL's independently . Inability to perform IADL's independently  Clinical Social Work Clinical Goal(s):  Marland Kitchen Over the next 120 days, patient/caregiver will work with SW bi-monthly by telephone or in person to reduce or manage symptoms related to caregiver stress  . Over the next 120 days, patient will work with SW to address concerns related to gaining additional support/resource connection in order to maintain health . Over the next 120 days, patient will demonstrate improved adherence to self care as evidenced by implementing  healthyself-care into her daily routine such as: attending all medical appointments, taking medications as prescribed, drinking water and daily exercise to improve mobility   Interventions: . Inter-disciplinary care team collaboration (see longitudinal plan of care) . Patient's son reports that patient has been admitted at [facility] since 06/04/19. Patient has had 3 different infections and they are currently waiting on lab results. . Son  reports that he has been in and out of the hospital since she arrived. He reports ongoing caregiver stress as his autistic son is unable to visit patent during her hospitalization which is an additional stressor for the family. . Patient interviewed and appropriate assessments performed . Provided patient with information about available personal care services and resources within the area such as PCS, Private pay caregiver, Adult Day Centers, [place] . Discussed plans with patient for ongoing care management follow up and provided patient with direct contact information for care management team . Collaborated with RN Case Manager and PCP re: multiple infections and current hospital admission at [facility] . Assisted patient/caregiver with obtaining information about health plan benefits . Provided education and assistance to client regarding Advanced Directives. . A voluntary and extensive discussion about advanced care planning including explanation and discussion of advanced was undertaken with the patient' son. Explanation regarding healthcare proxy and living will was reviewed and explanation of how to fill them out was given.   . Provided education to patient/caregiver regarding level of care options. . Provided education to patient/caregiver about Hospice and/or Palliative Care services  Patient Self Care Activities:  . Attends all scheduled provider appointments . Calls provider office for new concerns or questions . Lacks social connections . Unable to perform ADLs independently . Unable to perform IADLs independently  Initial goal documentation      Follow Up Plan: SW will follow up with patient by phone over the next quarter   Eula Fried, BSW, MSW, [email or web address] Phone: [phone]

## 2019-06-10 ENCOUNTER — Ambulatory Visit: Payer: Medicare Other | Admitting: Family Medicine

## 2019-06-10 MED ORDER — GENERIC EXTERNAL MEDICATION
12.50 | Status: DC
Start: ? — End: 2019-06-10

## 2019-06-10 MED ORDER — GUAIFENESIN 100 MG/5ML PO SYRP
200.00 | ORAL_SOLUTION | ORAL | Status: DC
Start: ? — End: 2019-06-10

## 2019-06-10 MED ORDER — GENERIC EXTERNAL MEDICATION
1.00 | Status: DC
Start: 2019-06-11 — End: 2019-06-10

## 2019-06-10 MED ORDER — METRONIDAZOLE 250 MG PO TABS
500.00 | ORAL_TABLET | ORAL | Status: DC
Start: 2019-06-15 — End: 2019-06-10

## 2019-06-11 ENCOUNTER — Telehealth: Payer: Self-pay

## 2019-06-15 ENCOUNTER — Telehealth: Payer: Self-pay

## 2019-06-17 ENCOUNTER — Ambulatory Visit (INDEPENDENT_AMBULATORY_CARE_PROVIDER_SITE_OTHER): Payer: Medicare Other | Admitting: Pharmacist

## 2019-06-17 DIAGNOSIS — I10 Essential (primary) hypertension: Secondary | ICD-10-CM | POA: Diagnosis not present

## 2019-06-17 MED ORDER — OXYCODONE HCL 5 MG PO TABS
2.50 | ORAL_TABLET | ORAL | Status: DC
Start: ? — End: 2019-06-17

## 2019-06-17 MED ORDER — ACETAMINOPHEN 160 MG/5ML PO SUSP
975.00 | ORAL | Status: DC
Start: 2019-06-15 — End: 2019-06-17

## 2019-06-17 MED ORDER — CEFPODOXIME PROXETIL 200 MG PO TABS
400.00 | ORAL_TABLET | ORAL | Status: DC
Start: 2019-06-15 — End: 2019-06-17

## 2019-06-17 NOTE — Patient Instructions (Signed)
Thank you allowing the Chronic Care Management Team to be a part of your care! It was a pleasure speaking with you today!     CCM (Chronic Care Management) Team    Noreene Larsson RN, MSN, CCM Nurse Care Coordinator  [phone]   Harlow Asa PharmD  Clinical Pharmacist  [phone]   Eula Fried LCSW Clinical Social Worker [phone]  Visit Information  Goals Addressed            This Visit's Progress   . PharmD - Medication Review       CARE PLAN ENTRY (see longitudinal plan of care for additional care plan information)   Current Barriers:  . 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  Interventions: . Inter-disciplinary care team collaboration (see longitudinal plan of care) . Perform chart review. Note patient admitted at [facility] 5/20-5/31 for abdominal wall abscess.  o At discharge, patient advised to START: - acetaminophen 325 MG tablet Take 2 tablets (650 mg total) by mouth every 4 (four) hours as needed for pain for up to 10 days - cefpodoxime 200 MG tablet Take 2 tablets (400 mg total) by mouth every 12 (twelve) hours for 6 days - docusate 100 MG capsule Take 1 capsule (100 mg total) by mouth 2 (two) times daily for 10 days - metroNIDAZOLE 500 MG tablet Take 1 tablet (500 mg total) by mouth every 8 (eight) hours for 6 days - ondansetron 4 MG disintegrating tablet Take 1 tablet (4 mg total) by mouth every 8 (eight) hours as needed for Nausea for up to 7 days - oxyCODONE 5 MG immediate release tablet Take 0.5-1.5 tablets (2.5-7.5 mg total) by mouth every 4 (four) hours as needed for up to 5 days o From review of chart note patient documented to have penicillin allergy. However, from notes from  admission, patient received Zosyn (noted to tolerate) and then ceftriaxone during admission (transitioned to cefpodoxime at discharge) . Follow up with patient's son/caregiver today who reports patient returned home on 5/31 o Reports he is caring for patient's wound, repacking twice daily as directed o Reports [place] scheduled to start next Monday o Reports patient taking metronidazole as directed, but does not have cefpodoxime. Reports [place] ordered cefpodoxime to come in today. . Discuss importance of medication adherence including completing full courses of antibiotics . Counsel to continue to monitor patient and notify provider of any signs of infection following parameters given at discharge . Schedule appointment to complete medication review with caregiver/patient . Place coordination of care call to patient's [place]. RPh confirms from invoice cefpodoxime shipped and arriving in order for patient today.  Patient Self Care Activities:  . Calls provider office for new concerns or questions  Initial goal documentation        Patient verbalizes understanding of instructions provided today.   Telephone follow up appointment with care management team member scheduled for: 6/23 at 10 am   Harlow Asa, PharmD, [place] [phone]

## 2019-06-17 NOTE — Chronic Care Management (AMB) (Signed)
Chronic Care Management   Follow Up Note   06/17/2019 Name: MYKIAH MAUNG MRN: [id number] DOB: August 31, 1934  Referred by: Olin Hauser, DO Reason for referral : Chronic Care Management (Initial Outreach Call)   TRANIYA ESTENSON is a 85 y.o. year old female who is a primary care patient of Olin Hauser, DO. The CCM team was consulted for assistance with chronic disease management and care coordination needs.    Receive referral from CCM Nurse Case Manager for medication review.   Note patient admitted at [facility] 5/20-5/31 for abdominal wall abscess.   I reached out to [name] by phone today. Speak with patient's son/caregiver, Darrick Grinder, today. Note son listed on patient's designated party release in chart.  Review of patient status, including review of consultants reports, relevant laboratory and other test results, and collaboration with appropriate care team members and the patient's provider was performed as part of comprehensive patient evaluation and provision of chronic care management services.     Outpatient Encounter Medications as of 06/17/2019  Medication Sig  . acetaminophen (TYLENOL) 325 MG tablet Take by mouth. Take 2 tablets (650 mg total) by mouth every 4 (four) hours as needed for Pain for up to 10 days  . cefpodoxime (VANTIN) 200 MG tablet Take by mouth. Take 2 tablets (400 mg total) by mouth every 12 (twelve) hours for 6 days  . Docusate Sodium (DSS) 100 MG CAPS Take by mouth. Take 1 capsule (100 mg total) by mouth 2 (two) times daily for 10 days  . metroNIDAZOLE (FLAGYL) 500 MG tablet Take by mouth. Take 1 tablet (500 mg total) by mouth every 8 (eight) hours for 6 days  . ondansetron (ZOFRAN-ODT) 4 MG disintegrating tablet Take by mouth. Take 1 tablet (4 mg total) by mouth every 8 (eight) hours as needed for Nausea for up to 7 days  . oxyCODONE (OXY IR/ROXICODONE) 5 MG immediate release tablet Take by mouth. Take 0.5-1.5  tablets (2.5-7.5 mg total) by mouth every 4 (four) hours as needed for up to 5 days  . Biotin 1 MG CAPS Take by mouth.  . Cholecalciferol (VITAMIN D3) 5000 units TABS Take 5,000 Units by mouth daily.  . Cranberry 450 MG CAPS Take 1 capsule by mouth daily.   Marland Kitchen dicyclomine (BENTYL) 10 MG capsule Take 1 capsule (10 mg total) by mouth 3 (three) times daily before meals. As needed for abdominal pain and cramping.  . gabapentin (NEURONTIN) 100 MG capsule Start 1 capsule daily, increase by 1 cap every 2-3 days as tolerated up to 3 times a day, or may take 3 at once in evening.  Marland Kitchen losartan (COZAAR) 25 MG tablet Take 1 tablet (25 mg total) by mouth daily.  . magnesium hydroxide (MILK OF MAGNESIA) 400 MG/5ML suspension Take 30 mLs by mouth daily as needed for mild constipation.  . montelukast (SINGULAIR) 10 MG tablet Take 1 tablet (10 mg total) by mouth at bedtime.  . Multiple Vitamin (MULTIVITAMIN) tablet Take 1 tablet by mouth daily.  . Nutritional Supplements (ENSURE ENLIVE PO) Take 1 Bottle by mouth 3 (three) times daily between meals. Not every time --occ.  Marland Kitchen OVER THE COUNTER MEDICATION Take by mouth as needed (CBD gummies for pain relief). The patient does not take pain medications but the CBD gummies are used and help her with some pain relief  . OVER THE COUNTER MEDICATION Apply topically daily. Skin Protective paste used daily for healing bilateral buttocks ulcerations  .  pilocarpine (PILOCAR) 1 % ophthalmic solution 1 drop 2 (two) times daily.  . polyethylene glycol powder (GLYCOLAX/MIRALAX) 17 GM/SCOOP powder Take 17 g by mouth daily as needed.  . psyllium (REGULOID) 0.52 g capsule Take 0.52 g by mouth daily.  Marland Kitchen triamcinolone cream (KENALOG) 0.1 % APP AA BID PRN  . vitamin B-12 (CYANOCOBALAMIN) 500 MCG tablet Take 500 mcg by mouth daily.   No facility-administered encounter medications on file as of 06/17/2019.    Goals Addressed            This Visit's Progress   . PharmD - Medication  Review       CARE PLAN ENTRY (see longitudinal plan of care for additional care plan information)   Current Barriers:  . 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  Interventions: . Inter-disciplinary care team collaboration (see longitudinal plan of care) . Perform chart review. Note patient admitted at [facility] 5/20-5/31 for abdominal wall abscess.  o At discharge, patient advised to START: - acetaminophen 325 MG tablet Take 2 tablets (650 mg total) by mouth every 4 (four) hours as needed for pain for up to 10 days - cefpodoxime 200 MG tablet Take 2 tablets (400 mg total) by mouth every 12 (twelve) hours for 6 days - docusate 100 MG capsule Take 1 capsule (100 mg total) by mouth 2 (two) times daily for 10 days - metroNIDAZOLE 500 MG tablet Take 1 tablet (500 mg total) by mouth every 8 (eight) hours for 6 days - ondansetron 4 MG disintegrating tablet Take 1 tablet (4 mg total) by mouth every 8 (eight) hours as needed for Nausea for up to 7 days - oxyCODONE 5 MG immediate release tablet Take 0.5-1.5 tablets (2.5-7.5 mg total) by mouth every 4 (four) hours as needed for up to 5 days o From review of chart note patient documented to have penicillin allergy. However, from notes from admission, patient received Zosyn (noted to tolerate) and then ceftriaxone during admission (transitioned to cefpodoxime at discharge) . Follow up with patient's son/caregiver today who reports patient returned home on 5/31 o Reports he is caring for patient's wound, repacking twice daily as directed o Reports [place] scheduled to start next Monday o Reports patient taking metronidazole as directed, but does not have cefpodoxime. Reports  [place] ordered cefpodoxime to come in today. . Discuss importance of medication adherence including completing full courses of antibiotics . Counsel to continue to monitor patient and notify provider of any signs of infection following parameters given at discharge . Schedule appointment to complete medication review with caregiver/patient . Place coordination of care call to patient's [place]. RPh confirms from invoice cefpodoxime shipped and arriving in order for patient today.  Patient Self Care Activities:  . Calls provider office for new concerns or questions  Initial goal documentation        Plan  Telephone follow up appointment with care management team member scheduled for: 6/23 at 10 am   Harlow Asa, PharmD, [place] [phone]

## 2019-06-17 NOTE — Chronic Care Management (AMB) (Signed)
  Chronic Care Management   Follow Up Note   06/17/2019 Name: [name] MRN: [id number] DOB: 15-Oct-1934  Referred by: Olin Hauser, DO Reason for referral : Chronic Care Management (Initial Outreach Call)   Regina Hall is a 85 y.o. year old female who is a primary care patient of Olin Hauser, DO. The CCM team was consulted for assistance with chronic disease management and care coordination needs.    Receive call back from patient's son/caregiver who reports that he called [facility] regarding picking up patient's cefpodoxime Rx today and was informed pharmacy does not have the medication.  Place 3-way call with patient's son to [employer]. Speak with [facility] RPh who confirms cefpodoxime came in order today and advises patient's son that he will have Rx ready for pick up by 2 pm today.  Patient's son denies further medication questions/concerns today.  Plan  Telephone follow up appointment with care management team member scheduled for: 6/23 at 10 am   Harlow Asa, PharmD, [place] [phone]

## 2019-06-18 ENCOUNTER — Telehealth: Payer: Self-pay | Admitting: Primary Care

## 2019-06-18 NOTE — Telephone Encounter (Signed)
T/c to patient to check in after hospital stay. Home health to start on Monday. They had no concerns today and will keep upcoming PC appt on 6/24.

## 2019-06-22 ENCOUNTER — Telehealth: Payer: Self-pay

## 2019-06-29 ENCOUNTER — Telehealth: Payer: Self-pay | Admitting: Family Medicine

## 2019-06-29 NOTE — Telephone Encounter (Signed)
Yes okay to authorize Palliative Care orders.  She is already scheduled for 07/09/19 with [facility] it looks like. But yes verbal if they need we can sign orders if faxed over  Nobie Putnam, [facility] 06/29/2019, 5:36 PM

## 2019-06-29 NOTE — Telephone Encounter (Signed)
Left message is it ok to give verbal ?

## 2019-06-29 NOTE — Telephone Encounter (Signed)
Copied from [place] [id number]. Topic: Quick Communication - Home Health Verbal Orders >> Jun 29, 2019  2:11 PM Leward Quan A wrote: Caller/Agency: [facility] / [facility] Requesting OT/PT/Skilled Nursing/Social Work/Speech Therapy:Requesting orders for palliative care per son and patient  Frequency:

## 2019-06-30 NOTE — Telephone Encounter (Signed)
I ordered [facility] palliative referral back when I saw this patient on 05/13/19  Her son and grandson were at the visit and we discussed it at that time.  It is for a home evaluation and discuss her chronic pain and symptom control and palliative can offer recommendations for her and do periodic monthly home visits etc.  Leone Payor NP from palliative was the one who actually scheduled the appointment.  Nobie Putnam, [facility] 06/30/2019, 11:02 AM

## 2019-06-30 NOTE — Telephone Encounter (Signed)
Left message

## 2019-06-30 NOTE — Telephone Encounter (Signed)
Estevan Oaks NP [facility] is already scheduled to do a home visit for them on 07/09/19.  If they do want to proceed with [facility] - the company we work with a lot - and they can help [place] coordinate her care with treatment and medication recommendations, then that is what I would recommend - they can keep that apt for 6/24  If they want to cancel that and switch to any other services from [facility], we would need to know what orders they need and [facility] can send [place] any orders and we can sign them if they prefer - I am not aware what services they provide if palliative care - but I do know they offer hospice. And I am not sure this patient meets hospice criteria.  If there is still any confusion I would recommend doing a virtual telephone only appointment to speak with son and the patient so we can coordinate and figure out how they want to proceed.  Nobie Putnam, [facility] 06/30/2019, 4:48 PM

## 2019-06-30 NOTE — Telephone Encounter (Signed)
As per Darnelle Going they have not heard anything from palliative care and wanted their third party hospice may be [facility] that can do home hospice care for patient due to her chronic pain, She has verbal ok from son and wanted to get verbal approval from PCP ?

## 2019-06-30 NOTE — Telephone Encounter (Signed)
Spoke to [place] she wanted to find out who has scheduled palliative care apt because the son does not know about it?

## 2019-07-01 ENCOUNTER — Encounter (INDEPENDENT_AMBULATORY_CARE_PROVIDER_SITE_OTHER): Payer: Medicare Other | Admitting: Ophthalmology

## 2019-07-01 NOTE — Telephone Encounter (Signed)
Patient's son Darrick Grinder states it is getting harder for him to take care her mother now with her chronic pain, incontinence , limited mobility, she is mostly Bed bound now. Eric from [place] will fax paperwork to get the order from provider.

## 2019-07-01 NOTE — Telephone Encounter (Signed)
Alright. Thank you. Will sign order once received to proceed with [facility] - will review orders, may be Hospice services based on this information.  Nobie Putnam, [facility] 07/01/2019, 2:55 PM

## 2019-07-01 NOTE — Telephone Encounter (Signed)
Left message for Son to call back.

## 2019-07-01 NOTE — Telephone Encounter (Signed)
Mr. Christia Reading notified for faxed order to [facility].

## 2019-07-02 ENCOUNTER — Encounter: Payer: Self-pay | Admitting: Family Medicine

## 2019-07-02 DIAGNOSIS — Z515 Encounter for palliative care: Secondary | ICD-10-CM | POA: Insufficient documentation

## 2019-07-07 ENCOUNTER — Encounter (INDEPENDENT_AMBULATORY_CARE_PROVIDER_SITE_OTHER): Payer: Medicare Other | Admitting: Ophthalmology

## 2019-07-08 ENCOUNTER — Telehealth: Payer: Self-pay

## 2019-07-08 ENCOUNTER — Other Ambulatory Visit: Payer: Self-pay | Admitting: Family Medicine

## 2019-07-08 ENCOUNTER — Ambulatory Visit: Payer: Medicare Other | Admitting: Pharmacist

## 2019-07-08 DIAGNOSIS — G894 Chronic pain syndrome: Secondary | ICD-10-CM

## 2019-07-08 DIAGNOSIS — I10 Essential (primary) hypertension: Secondary | ICD-10-CM

## 2019-07-08 DIAGNOSIS — Z515 Encounter for palliative care: Secondary | ICD-10-CM

## 2019-07-08 MED ORDER — GABAPENTIN 100 MG PO CAPS: ORAL_CAPSULE | ORAL | 2 refills | Status: DC

## 2019-07-08 NOTE — Chronic Care Management (AMB) (Addendum)
Chronic Care Management   Follow Up Note   07/08/2019 Name: Regina Hall MRN: [id number] DOB: April 02, 1934  Referred by: Olin Hauser, DO Reason for referral : No chief complaint on file.   Regina Hall is a 85 y.o. year old female who is a primary care patient of Olin Hauser, DO. The CCM team was consulted for assistance with chronic disease management and care coordination needs.    Reach out to patient's son/caregiver, Darrick Grinder, today, as scheduled to complete medication review. Note son listed on patient's designated party release in chart.  Review of patient status, including review of consultants reports, relevant laboratory and other test results, and collaboration with appropriate care team members and the patient's provider was performed as part of comprehensive patient evaluation and provision of chronic care management services.     Outpatient Encounter Medications as of 07/08/2019  Medication Sig  . Cholecalciferol (VITAMIN D3) 5000 units TABS Take 5,000 Units by mouth daily.  . Cranberry 450 MG CAPS Take 1 capsule by mouth daily.   . fentaNYL (DURAGESIC) 25 MCG/HR 1 patch every 3 (three) days.  Marland Kitchen gabapentin (NEURONTIN) 100 MG capsule Start 1 capsule daily, increase by 1 cap every 2-3 days as tolerated up to 3 times a day, or may take 3 at once in evening. (Patient taking differently: 100 mg. Taking: 1 capsule QAM, 1 capsule Q afternoon and 3 capsules at bedtime)  . losartan (COZAAR) 25 MG tablet Take 1 tablet (25 mg total) by mouth daily.  . montelukast (SINGULAIR) 10 MG tablet Take 1 tablet (10 mg total) by mouth at bedtime.  Marland Kitchen OVER THE COUNTER MEDICATION Apply topically daily. Skin Protective paste used daily for healing bilateral buttocks ulcerations  . pilocarpine (PILOCAR) 1 % ophthalmic solution 1 drop 2 (two) times daily.  . polyethylene glycol powder (GLYCOLAX/MIRALAX) 17 GM/SCOOP powder Take 17 g by mouth daily as needed.  .  triamcinolone cream (KENALOG) 0.1 % APP AA BID PRN  . vitamin B-12 (CYANOCOBALAMIN) 500 MCG tablet Take 500 mcg by mouth daily.  Marland Kitchen dicyclomine (BENTYL) 10 MG capsule Take 1 capsule (10 mg total) by mouth 3 (three) times daily before meals. As needed for abdominal pain and cramping. (Patient not taking: Reported on 07/08/2019)  . Nutritional Supplements (ENSURE ENLIVE PO) Take 1 Bottle by mouth 3 (three) times daily between meals. Not every time --occ. (Patient not taking: Reported on 07/08/2019)  . OVER THE COUNTER MEDICATION Take by mouth as needed (CBD gummies for pain relief). The patient does not take pain medications but the CBD gummies are used and help her with some pain relief (Patient not taking: Reported on 07/08/2019)  . [DISCONTINUED] Biotin 1 MG CAPS Take by mouth. (Patient not taking: Reported on 07/08/2019)  . [DISCONTINUED] magnesium hydroxide (MILK OF MAGNESIA) 400 MG/5ML suspension Take 30 mLs by mouth daily as needed for mild constipation. (Patient not taking: Reported on 07/08/2019)  . [DISCONTINUED] Multiple Vitamin (MULTIVITAMIN) tablet Take 1 tablet by mouth daily. (Patient not taking: Reported on 07/08/2019)  . [DISCONTINUED] psyllium (REGULOID) 0.52 g capsule Take 0.52 g by mouth daily. (Patient not taking: Reported on 07/08/2019)   No facility-administered encounter medications on file as of 07/08/2019.    Goals Addressed              This Visit's Progress   .  COMPLETED: PharmD - Medication Review        CARE PLAN ENTRY (see longitudinal plan of care for additional  care plan information)   Current Barriers:  . 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  Interventions: . Inter-disciplinary care team collaboration (see  longitudinal plan of care) . Follow up with patient's son/caregiver, Darrick Grinder, who reports patient has started receiving hospice care from [facility]. o Reports he continues wound care for patient and wound also monitored by hospice . Comprehensive medication review performed; medication list updated in electronic medical record o Son reports patient's pain well controlled since taking both gabapentin and Fentanyl patch as prescribed by hospice provider. - Reports patient currently taking gabapentin 100 mg - 1 capsule QAM, 1 capsule in afternoon and 3 capsules QHS. Marland Kitchen Note latest Rx from 4/28 from PCP written for patient to take up to 3 capsules daily - Reports patient no longer using CBD gummies since pain controlled with Fentanyl and gabapentin o Counsel on risk of sedation/dizziness with fentanyl and gabapentin - Reports monitoring patient for sedation. Reports patient not fall risk as bed bound.  o Reports patient does not drink Ensure, but son makes a protein shake using almond milk daily . Encourage son/caregiver to follow up with providers for new or worsening medical concerns . Son denies any further medication questions/concerns on behalf of patient at this time. . Will follow up with PCP regarding how patient currently taking gabapentin  Patient Self Care Activities:  . Calls provider office for new concerns or questions  Please see past updates related to this goal by clicking on the "Past Updates" button in the selected goal         Plan  1) No further follow up required: Patient receiving hospice services from [facility] 2) Will notify [place] Team  Harlow Asa, PharmD, [place] [phone]

## 2019-07-08 NOTE — Patient Instructions (Signed)
Thank you allowing the Chronic Care Management Team to be a part of your care! It was a pleasure speaking with you today!     CCM (Chronic Care Management) Team    Noreene Larsson RN, MSN, CCM Nurse Care Coordinator  [phone]   Harlow Asa PharmD  Clinical Pharmacist  [phone]   Eula Fried LCSW Clinical Social Worker [phone]  Visit Information  Goals Addressed            This Visit's Progress   . PharmD - Medication Review       CARE PLAN ENTRY (see longitudinal plan of care for additional care plan information)   Current Barriers:  . 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  Interventions: . Inter-disciplinary care team collaboration (see longitudinal plan of care) . Follow up with patient's son/caregiver, Darrick Grinder, who reports patient has started receiving hospice care. o Reports he continues wound care for patient and wound also monitored by hospice . Comprehensive medication review performed; medication list updated in electronic medical record o Son reports patient's pain well controlled since taking both gabapentin and Fentanyl patch as prescribed by hospice provider. - Reports patient currently taking gabapentin 100 mg - 1 capsule QAM, 1 capsule in afternoon and 3 capsules QHS. Marland Kitchen Note latest Rx from 4/28 from PCP written for patient to take up to 3 capsules daily - Reports patient no longer using CBD gummies since pain controlled with Fentanyl and gabapentin o Counsel on risk of sedation/dizziness with fentanyl and gabapentin - Reports monitoring patient for sedation. Reports patient not fall risk as bed bound.  o Reports patient does not drink Ensure, but son makes a protein shake using  almond milk daily . Encourage son/caregiver to follow up with providers for new or worsening medical concerns . Son denies any further medication questions/concerns on behalf of patient at this time. . Will follow up with PCP regarding how patient currently taking gabapentin  Patient Self Care Activities:  . Calls provider office for new concerns or questions  Please see past updates related to this goal by clicking on the "Past Updates" button in the selected goal         Patient verbalizes understanding of instructions provided today.   1) Son/caregiver denies any further medication questions/concerns on behalf of patient at this time. 2) Caregiver has been provided with contact information for CM Pharmacist and has been advised to call with any medication related questions or concerns.  Harlow Asa, PharmD, [place] [employer] [phone]

## 2019-07-09 ENCOUNTER — Other Ambulatory Visit: Payer: Medicare Other | Admitting: Primary Care

## 2019-07-13 ENCOUNTER — Telehealth: Payer: Self-pay | Admitting: Family Medicine

## 2019-07-13 ENCOUNTER — Other Ambulatory Visit: Payer: Self-pay | Admitting: Cardiovascular Disease

## 2019-07-13 DIAGNOSIS — I10 Essential (primary) hypertension: Secondary | ICD-10-CM

## 2019-07-13 NOTE — Telephone Encounter (Signed)
Confirmed Crystal has received orders via fax done.

## 2019-07-13 NOTE — Telephone Encounter (Signed)
Home Health Verbal Orders - Caller/Agency:C[facility] with  [facility] Callback Number: [phone] Requesting OT/PT/Skilled Nursing/Social Work/Speech Therapy: she stated that she just faxed over a addon order for pt that they need to see if they can get returned pretty quickly ?    Fax number [phone]

## 2019-07-16 ENCOUNTER — Telehealth: Payer: Self-pay

## 2019-07-28 ENCOUNTER — Encounter (INDEPENDENT_AMBULATORY_CARE_PROVIDER_SITE_OTHER): Payer: Medicare Other | Admitting: Ophthalmology

## 2019-07-30 ENCOUNTER — Telehealth: Payer: Self-pay

## 2019-08-05 ENCOUNTER — Other Ambulatory Visit: Payer: Self-pay | Admitting: Family Medicine

## 2019-08-05 DIAGNOSIS — Z515 Encounter for palliative care: Secondary | ICD-10-CM

## 2019-08-05 DIAGNOSIS — G894 Chronic pain syndrome: Secondary | ICD-10-CM

## 2019-08-07 NOTE — Progress Notes (Signed)
[facility] Note  08/10/2019     CHIEF COMPLAINT Patient presents for Retina Follow Up   HISTORY OF PRESENT ILLNESS: Regina Hall is a 85 y.o. female who presents to the clinic today for:   HPI    Retina Follow Up    Patient presents with  Wet AMD.  In right eye.  Severity is moderate.  Duration of 12 weeks.  Since onset it is stable.  I, the attending physician,  performed the HPI with the patient and updated documentation appropriately.          Comments    Patient states vision seems the same OU. Still has double vision OU. Patient wonders if it would be a good time to change glasses RX.        Last edited by [name], MD on 08/10/2019 10:34 PM. (History)    Today's f/u closer to 4 mos than 12 wks   Referring physician: Olin Hauser, DO [place],  [place] [address]  HISTORICAL INFORMATION:   Selected notes from the MEDICAL RECORD NUMBER Referred by Dr. Vevelyn Royals for concern of ARMD LEE: ([name]) [BCVA: OD: OS:] Ocular Hx-pseudo OU, UL reconstuction (Dr. [name], 02.24.20) PMH-anxiety, arthritis, cancer, HTN, scoliosis   CURRENT MEDICATIONS: Current Outpatient Medications (Ophthalmic Drugs)  Medication Sig  . pilocarpine (PILOCAR) 1 % ophthalmic solution Place 1 drop into both eyes 2 (two) times daily.    No current facility-administered medications for this visit. (Ophthalmic Drugs)   Current Outpatient Medications (Other)  Medication Sig  . Cholecalciferol (VITAMIN D3) 5000 units TABS Take 5,000 Units by mouth daily.  . Cranberry 450 MG CAPS Take 1 capsule by mouth daily.   . fentaNYL (DURAGESIC) 25 MCG/HR 1 patch every 3 (three) days.  Marland Kitchen gabapentin (NEURONTIN) 100 MG capsule Take 1 capsule (100 mg total) by mouth 3 (three) times daily.  Marland Kitchen losartan (COZAAR) 25 MG tablet Take 1 tablet (25 mg total) by mouth daily.  . montelukast (SINGULAIR) 10 MG tablet Take 1 tablet (10 mg total) by mouth at  bedtime.  Marland Kitchen OVER THE COUNTER MEDICATION Apply topically daily. Skin Protective paste used daily for healing bilateral buttocks ulcerations  . polyethylene glycol powder (GLYCOLAX/MIRALAX) 17 GM/SCOOP powder Take 17 g by mouth daily as needed.  . triamcinolone cream (KENALOG) 0.1 % APP AA BID PRN  . vitamin B-12 (CYANOCOBALAMIN) 500 MCG tablet Take 500 mcg by mouth daily.  Marland Kitchen dicyclomine (BENTYL) 10 MG capsule Take 1 capsule (10 mg total) by mouth 3 (three) times daily before meals. As needed for abdominal pain and cramping. (Patient not taking: Reported on 07/08/2019)  . Nutritional Supplements (ENSURE ENLIVE PO) Take 1 Bottle by mouth 3 (three) times daily between meals. Not every time --occ. (Patient not taking: Reported on 07/08/2019)  . OVER THE COUNTER MEDICATION Take by mouth as needed (CBD gummies for pain relief). The patient does not take pain medications but the CBD gummies are used and help her with some pain relief (Patient not taking: Reported on 07/08/2019)   No current facility-administered medications for this visit. (Other)      REVIEW OF SYSTEMS: ROS    Positive for: Gastrointestinal, Genitourinary, Musculoskeletal, Eyes, Respiratory   Negative for: Constitutional, Neurological, Skin, HENT, Endocrine, Cardiovascular, Psychiatric, Allergic/Imm, Heme/Lymph   Last edited by Roselee Nova D, COT on 08/10/2019  2:28 PM. (History)       ALLERGIES Allergies  Allergen Reactions  . Hydrocodone-Acetaminophen  Anaphylaxis  . Penicillins Rash, Other (See Comments) and Hives    Has patient had a PCN reaction causing immediate rash, facial/tongue/throat swelling, SOB or lightheadedness with hypotension: ###Yes## Has patient had a PCN reaction causing severe rash involving mucus membranes or skin necrosis: No Has patient had a PCN reaction that required hospitalization No Has patient had a PCN reaction occurring within the last 10 years: No If all of the above answers are "NO", then may  proceed with Cephalosporin use.   . Vancomycin Shortness Of Breath and Itching    PAST MEDICAL HISTORY Past Medical History:  Diagnosis Date  . Anemia   . Anxiety    pt denies  . Arthritis    KNEES AND BACK  . Bladder incontinence   . Cancer ([place])    basal cell cancer on nose  . Edema of both feet   . Hypertension   . Hypertensive retinopathy    OU  . Macular degeneration    Wet OD, Dry OS  . Pneumonia   . PONV (postoperative nausea and vomiting)    PONV X 1 EPISODE, COMES OUT OF ANESTHESIA FAST, SOME AWARENESS DURING END OF COLONSCOPY  . Scoliosis   . VRE (vancomycin resistant enterococcus) culture positive    Past Surgical History:  Procedure Laterality Date  . ABDOMINAL HYSTERECTOMY     2006 VAG HYST  . ABDOMINAL SURGERY    . APPLICATION OF WOUND VAC  03/10/2018   Procedure: APPLICATION OF WOUND VAC;  Surgeon: [name], MD;  Location: [facility];  Service: General;;  . BACK SURGERY     LOWER, X2  . BIOPSY Right 05/02/2017   Procedure: BIOPSY OF RIGHT UPPER EYELID LESION;  Surgeon: [name], MD;  Location: [facility];  Service: Ophthalmology;  Laterality: Right;  . CATARACT EXTRACTION Bilateral   . COLECTOMY WITH COLOSTOMY CREATION/HARTMANN PROCEDURE N/A 02/28/2018   Procedure: PARTIAL COLECTOMY WITH COLOSTOMY;  Surgeon: [name], MD;  Location: [facility];  Service: General;  Laterality: N/A;  . EYE SURGERY Bilateral    cataract surgery with lens implant  . FLEXIBLE SIGMOIDOSCOPY N/A 02/21/2018   Procedure: FLEXIBLE SIGMOIDOSCOPY;  Surgeon: [name], MD;  [facility];  Service: Endoscopy;  Laterality: N/A;  . JOINT REPLACEMENT     2011 LT HIP  . LAPAROTOMY N/A 03/10/2018   Procedure: EXPLORATORY LAPAROTOMY;  Surgeon: [name], MD;  Location: [facility];  Service: General;  Laterality: N/A;  . POLYPECTOMY  02/21/2018   Procedure: POLYPECTOMY;  Surgeon: [name], MD;  [facility];  Service: Endoscopy;;  rectum  .  RECONSTRUCTION OF EYELID Right 05/02/2017   Procedure: TOTAL RECONSTRUCTION OF UPPER EYELID RIGHT EYE WITH FULL THICKNESS SKIN GRAFT FROM RIGHT POSTERIOR EAR;  Surgeon: [name], MD;  Location: [facility];  Service: Ophthalmology;  Laterality: Right;  . SECONDARY CLOSURE OF WOUND  03/10/2018   Procedure: SECONDARY CLOSURE OF WOUND;  Surgeon: [name], MD;  Location: [facility];  Service: General;;  . TONSILLECTOMY    . TOTAL KNEE ARTHROPLASTY Right 04/27/2014   Procedure: [place] TOTAL KNEE ARTHROPLASTY;  Surgeon: Rod Can, MD;  Location: [facility];  Service: Orthopedics;  Laterality: Right;  . TUBAL LIGATION     1974  . VENA CAVA FILTER PLACEMENT N/A 06/04/2017   Procedure: INSERTION VENA-CAVA FILTER;  Surgeon: Angelia Mould, MD;  [facility];  Service: Vascular;  Laterality: N/A;    FAMILY HISTORY Family History  Problem Relation  Age of Onset  . Congestive Heart Failure Mother   . Colon cancer Father   . Cancer Brother   . Arthritis Sister   . Asthma Sister   . Diabetes Son   . Diabetes Son     SOCIAL HISTORY Social History   Tobacco Use  . Smoking status: Never Smoker  . Smokeless tobacco: Never Used  Vaping Use  . Vaping Use: Never used  Substance Use Topics  . Alcohol use: Yes    Comment: OCC WINE  . Drug use: No         OPHTHALMIC EXAM:  Base Eye Exam    Visual Acuity (Snellen - Linear)      Right Left   Dist [place] 20/30 20/50 +2   Dist ph [place] NI 20/40 -2       Tonometry (Tonopen, 2:44 PM)      Right Left   Pressure 14 15       Pupils      Dark Light Shape React APD   Right 2 1.5 Round Slow None   Left 2 1.5 Round Slow None       Visual Fields (Counting fingers)      Left Right    Full Full       Extraocular Movement      Right Left    Full, Ortho Full, Ortho       Neuro/Psych    Oriented x3: Yes   Mood/Affect: Normal       Dilation    Both eyes: 1.0% Mydriacyl, 2.5% Phenylephrine @ 2:44 PM  1 gtt of 10% phenyl given OD  at 3:50 p.m.        Slit Lamp and Fundus Exam    Slit Lamp Exam      Right Left   Lids/Lashes Dermatochalasis - upper lid, Meibomian gland dysfunction, Telangiectasia Dermatochalasis - upper lid, Meibomian gland dysfunction, Telangiectasia   Conjunctiva/Sclera White and quiet White and quiet   Cornea 8 RK scars with central iron lines, mild Arcus 8 RK scars with central iron lines, mild Arcus   Anterior Chamber Deep and quiet Deep and quiet   Iris Pinpoint pupil Pinpoint pupil   Lens PC IOL in good position with open PC PC IOL in good position with open PC   Vitreous Vitreous syneresis, Posterior vitreous detachment Vitreous syneresis, Posterior vitreous detachment       Fundus Exam      Right Left   Disc Sharp rim, mild Pallor, Compact Compact, mild temporal pallor, sharp rim   C/D Ratio 0.3 0.2   Macula Flat, Blunted foveal reflex, Drusen, RPE mottling and clumping, shallow SRF overlying central PED, no heme - stable from prior Blunted foveal reflex, Retinal pigment epithelial mottling, Drusen, early central Atrophy with +cystic changes, focal dot heme superior macula   Vessels Vascular attenuation Vascular attenuation   Periphery Attached, midzonal drusen Attached, midzonal drusen            IMAGING AND PROCEDURES  Imaging and Procedures for @TODAY @           ASSESSMENT/PLAN:    ICD-10-CM   1. Exudative age-related macular degeneration of right eye with active choroidal neovascularization ([place])  H35.3211   2. Retinal edema  H35.81   3. 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 Essential hypertension  I10   5. Hypertensive retinopathy of both eyes  H35.033   6. Pseudophakia of both eyes  Z96.1   7. History of radial keratotomy  Z98.890     1,2. Exudative age related macular degeneration, OD   - at presentation 06/23/2018 pt reported decreased vision OD for months -- started while in and out  of hospital around January or Feb 2020  - s/p IVA OD #1 (06.08.20) #2 (07.07.20), #3 (10.06.20), #4 (12.21.20), #5 (3.15.21)  - Unable to obtain OCT today, 7.26.21, due to being completely wheelchair bound  - BCVA down to 20/30 from 20/25 today, 7.26.21  - Hold off on IVA #6 today, 7.26.21 due to inability to get good look at retina due to poor dilation (pt on Fentanyl patch for pain)  - f/u prn -- pt on Hospice care  3. Age related macular degeneration, non-exudative, OS  - prior OCTs show central ORA with some mild cystic changes  - BCVA stable at 20/40 OS today  - pt not particularly bothered by vision OS  - continue amsler grid monitoring             - monitor  4,5. Hypertensive retinopathy OU  - discussed importance of tight BP control  - monitor  6. Pseudophakia OU  - s/p CE/IOL OU (Dr. [name])  - beautiful surgeries, doing well  - monitor  7. History of Radial Keratotomy   - 8 cut RK OU  - central iron lines OU, but cornea stable  - monitor  Ophthalmic Meds Ordered this visit:  No orders of the defined types were placed in this encounter.      Return if symptoms worsen or fail to improve.  There are no Patient Instructions on file for this visit.   Explained the diagnoses, plan, and follow up with the patient and they expressed understanding.  Patient expressed understanding of the importance of proper follow up care.   This document serves as a record of services personally performed by Gardiner Sleeper, MD, PhD. It was created on their behalf by Estill Bakes, COT an ophthalmic technician. The creation of this record is the provider's dictation and/or activities during the visit.    Electronically signed by: Estill Bakes, COT 08/07/19 @ 10:42 PM   This document serves as a record of services personally performed by Gardiner Sleeper, MD, PhD. It was created on their behalf by Estill Bakes, COT an ophthalmic technician. The creation of this record is the  provider's dictation and/or activities during the visit.    Electronically signed by: Estill Bakes, COT 08/10/19 @ 10:42 PM  Gardiner Sleeper, M.D., Ph.D. Diseases & Surgery of the Retina and [place] 08/10/2019   I have reviewed the above documentation for accuracy and completeness, and I agree with the above. Gardiner Sleeper, M.D., Ph.D. 08/10/19 10:42 PM   Abbreviations: M myopia (nearsighted); A astigmatism; H hyperopia (farsighted); P presbyopia; Mrx spectacle prescription;  CTL contact lenses; OD right eye; OS left eye; OU both eyes  XT exotropia; ET esotropia; PEK punctate epithelial keratitis; PEE punctate epithelial erosions; DES dry eye syndrome; MGD meibomian gland dysfunction; ATs artificial tears; PFAT's preservative free artificial tears; [place] nuclear sclerotic cataract; PSC posterior subcapsular cataract; ERM epi-retinal membrane; PVD posterior vitreous detachment; RD retinal detachment; DM diabetes mellitus; DR diabetic retinopathy; NPDR non-proliferative diabetic retinopathy; PDR proliferative diabetic retinopathy; CSME clinically significant macular edema; DME diabetic macular edema; dbh dot blot hemorrhages; CWS cotton wool spot; POAG primary open angle glaucoma; C/D cup-to-disc ratio; HVF humphrey visual field; GVF goldmann visual field; OCT  optical coherence tomography; IOP intraocular pressure; BRVO Branch retinal vein occlusion; CRVO central retinal vein occlusion; CRAO central retinal artery occlusion; BRAO branch retinal artery occlusion; RT retinal tear; SB scleral buckle; PPV pars plana vitrectomy; VH Vitreous hemorrhage; PRP panretinal laser photocoagulation; IVK intravitreal kenalog; VMT vitreomacular traction; MH Macular hole;  NVD neovascularization of the disc; NVE neovascularization elsewhere; AREDS age related eye disease study; ARMD age related macular degeneration; POAG primary open angle glaucoma; EBMD epithelial/anterior basement  membrane dystrophy; ACIOL anterior chamber intraocular lens; IOL intraocular lens; PCIOL posterior chamber intraocular lens; Phaco/IOL phacoemulsification with intraocular lens placement; [place] photorefractive keratectomy; LASIK laser assisted in situ keratomileusis; HTN hypertension; DM diabetes mellitus; COPD chronic obstructive pulmonary disease

## 2019-08-10 ENCOUNTER — Other Ambulatory Visit: Payer: Self-pay

## 2019-08-10 ENCOUNTER — Encounter (INDEPENDENT_AMBULATORY_CARE_PROVIDER_SITE_OTHER): Payer: Self-pay | Admitting: Ophthalmology

## 2019-08-10 ENCOUNTER — Ambulatory Visit (INDEPENDENT_AMBULATORY_CARE_PROVIDER_SITE_OTHER): Payer: Medicare Other | Admitting: Ophthalmology

## 2019-08-10 DIAGNOSIS — H353124 Nonexudative age-related macular degeneration, left eye, advanced atrophic with subfoveal involvement: Secondary | ICD-10-CM | POA: Diagnosis not present

## 2019-08-10 DIAGNOSIS — H353211 Exudative age-related macular degeneration, right eye, with active choroidal neovascularization: Secondary | ICD-10-CM

## 2019-08-10 DIAGNOSIS — I10 Essential (primary) hypertension: Secondary | ICD-10-CM | POA: Diagnosis not present

## 2019-08-10 DIAGNOSIS — H3581 Retinal edema: Secondary | ICD-10-CM

## 2019-08-10 DIAGNOSIS — Z9889 Other specified postprocedural states: Secondary | ICD-10-CM

## 2019-08-10 DIAGNOSIS — Z961 Presence of intraocular lens: Secondary | ICD-10-CM

## 2019-08-10 DIAGNOSIS — H35033 Hypertensive retinopathy, bilateral: Secondary | ICD-10-CM

## 2019-10-06 ENCOUNTER — Other Ambulatory Visit: Payer: Self-pay | Admitting: Nephrology

## 2019-10-06 DIAGNOSIS — N1832 Chronic kidney disease, stage 3b: Secondary | ICD-10-CM

## 2019-10-12 ENCOUNTER — Other Ambulatory Visit: Payer: Self-pay

## 2019-10-12 ENCOUNTER — Ambulatory Visit
Admission: RE | Admit: 2019-10-12 | Discharge: 2019-10-12 | Disposition: A | Discharge status: Home / Self Care | Payer: Medicare Other | Source: Ambulatory Visit | Attending: Nephrology | Admitting: Nephrology

## 2019-10-12 DIAGNOSIS — N1832 Chronic kidney disease, stage 3b: Secondary | ICD-10-CM | POA: Diagnosis present

## 2020-01-21 ENCOUNTER — Telehealth: Payer: Self-pay | Admitting: Family Medicine

## 2020-01-21 NOTE — Telephone Encounter (Signed)
Copied from CRM [id number]. Topic: Medicare AWV >> Jan 21, 2020  2:23 PM Claudette Laws R wrote: Reason for CRM:  No answer unable to leave message for patient to call back and schedule the Medicare Annual Wellness Visit (AWV) virtually.  Last AWV 12/19/2017  Please schedule at anytime with [facility] Advisor.  40 minute appointment  Any questions, please call me at [phone]

## 2021-02-03 IMAGING — CR DG CHEST 1V PORT
1 series · 1 of 1 positions shown · non-contrast
Comparison: 02/28/2016

CLINICAL DATA: 83-year-old female with intermittent productive
cough

EXAM:
PORTABLE CHEST 1 VIEW

[portable]
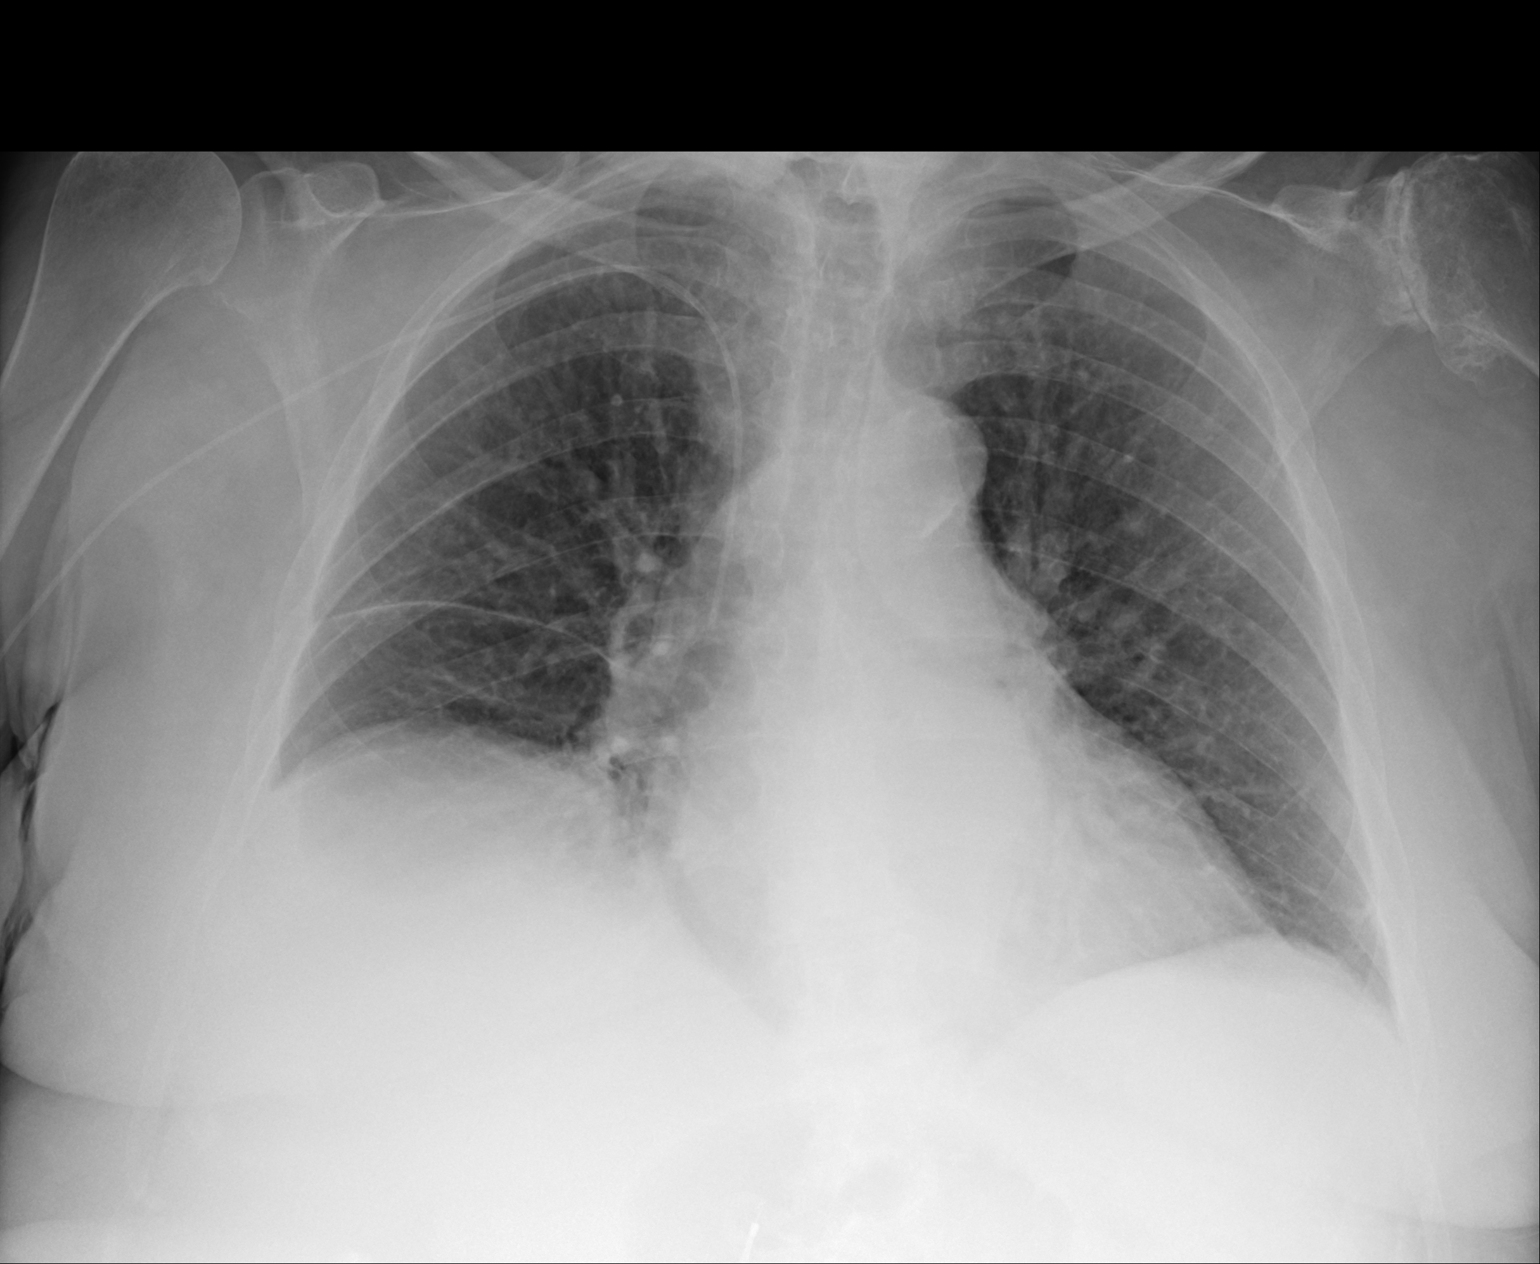

[1 of 1 positions shown; findings below may reference images not displayed]

FINDINGS: Cardiomediastinal silhouette unchanged.

Interval development of asymmetric elevation the right
hemidiaphragm.

No pneumothorax.

Low lung volumes with coarsened interstitial markings and
interlobular septal thickening. No large pleural effusion. No
confluent airspace disease.

Interval placement of right upper extremity PICC with the tip
appearing to terminate superior vena cava. Degenerative changes of
the left glenohumeral joint.
IMPRESSION: Low lung volumes with questionable edema.

New asymmetric elevation the right hemidiaphragm, uncertain
significance.

Right upper extremity PICC.

## 2021-02-07 IMAGING — CR DG CHEST 1V PORT
1 series · 1 of 1 positions shown · non-contrast
Comparison: March 01, 2018

CLINICAL DATA: Hypoxia

EXAM:
PORTABLE CHEST 1 VIEW

[portable]
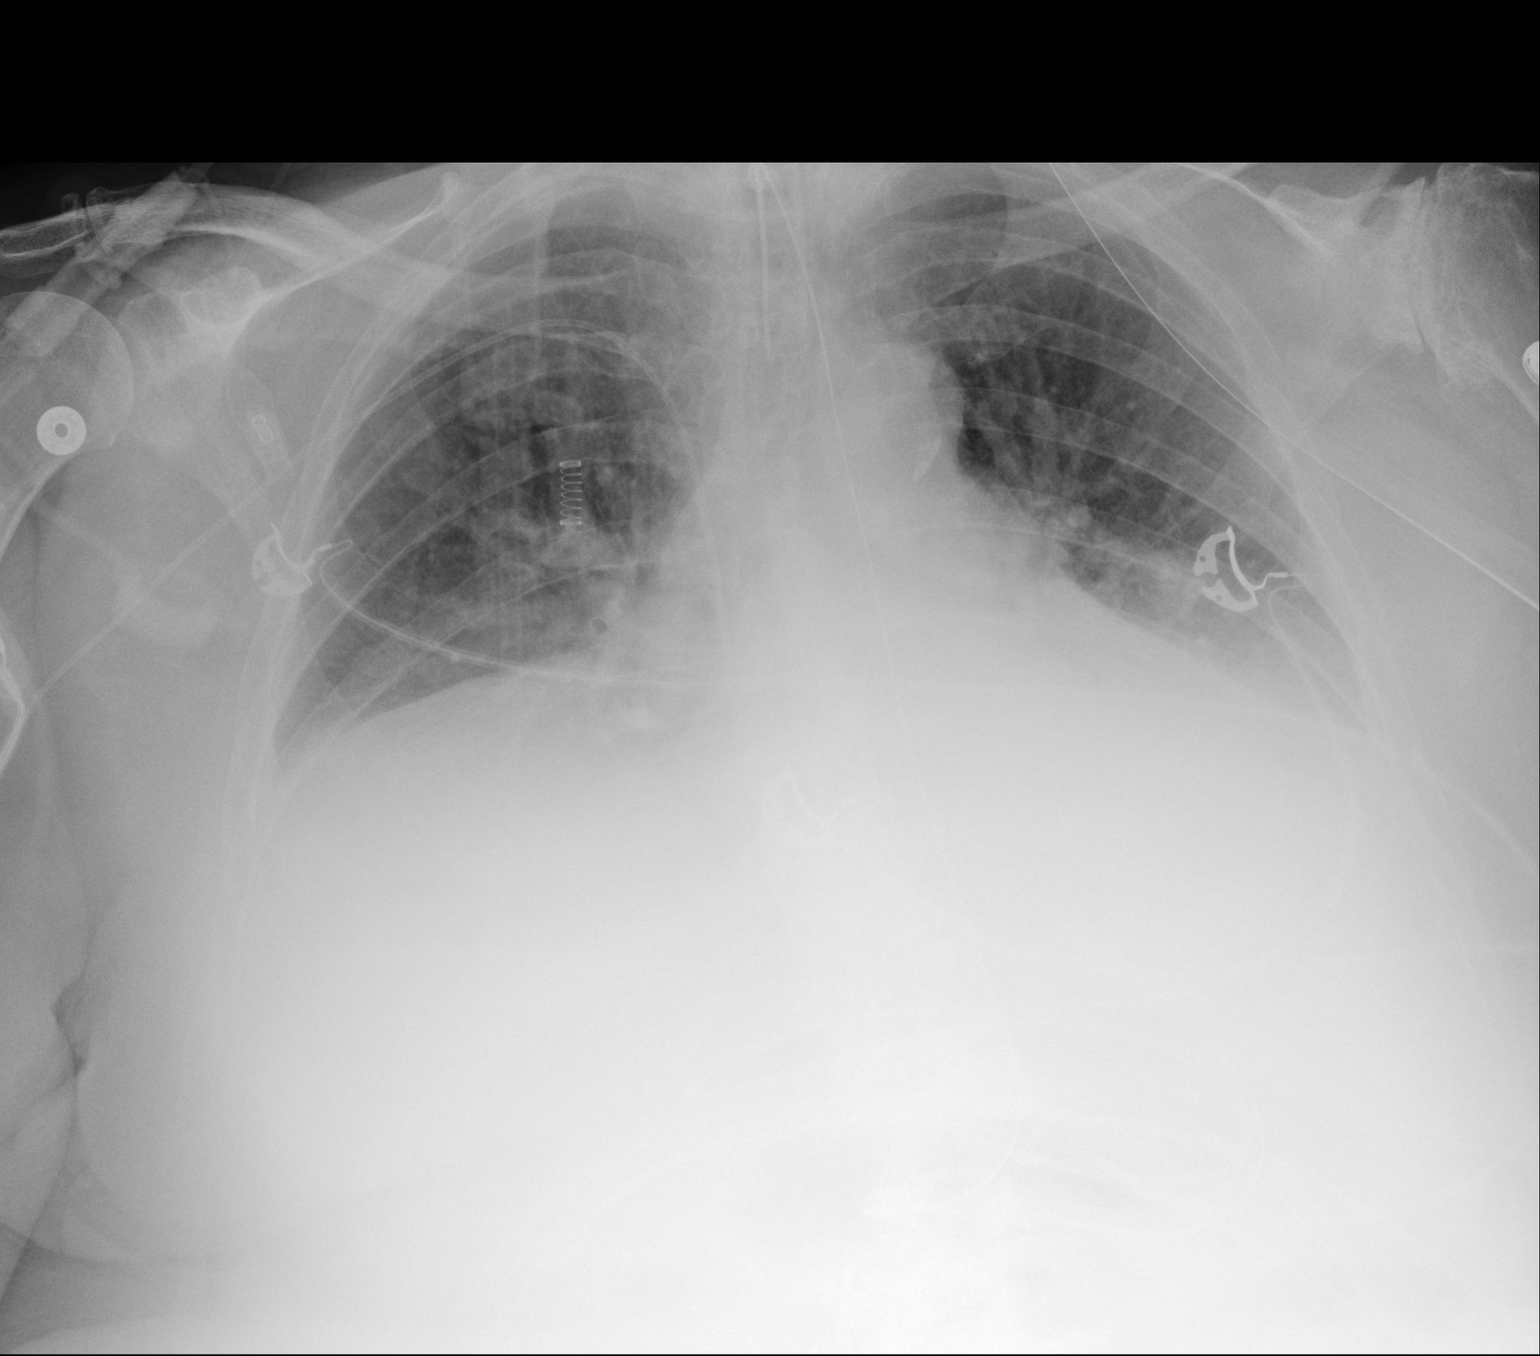

[1 of 1 positions shown; findings below may reference images not displayed]

FINDINGS: Endotracheal tube tip is 3.7 cm above the carina. Nasogastric tube
tip and side port are in the stomach. Right subclavian catheter tip
is at the cavoatrial junction. No pneumothorax.

There is cardiomegaly with pulmonary venous hypertension. There are
small pleural effusions bilaterally. There is atelectatic change in
the lung bases. A degree of consolidation in the left base is
questioned.

There is advanced arthropathy in the left shoulder.
IMPRESSION: Tube and catheter positions as described without pneumothorax.
Pulmonary vascular congestion present with small pleural effusions
bilaterally. There is bibasilar atelectasis with a questionable
degree of consolidation in the left base.

Aortic Atherosclerosis ([id number]-HKT.T).

## 2021-02-08 IMAGING — CR DG CHEST 1V PORT
1 series · 1 of 1 positions shown · non-contrast
Comparison: Radiograph March 02, 2018.

CLINICAL DATA: Pulmonary edema.

EXAM:
PORTABLE CHEST 1 VIEW

[portable]
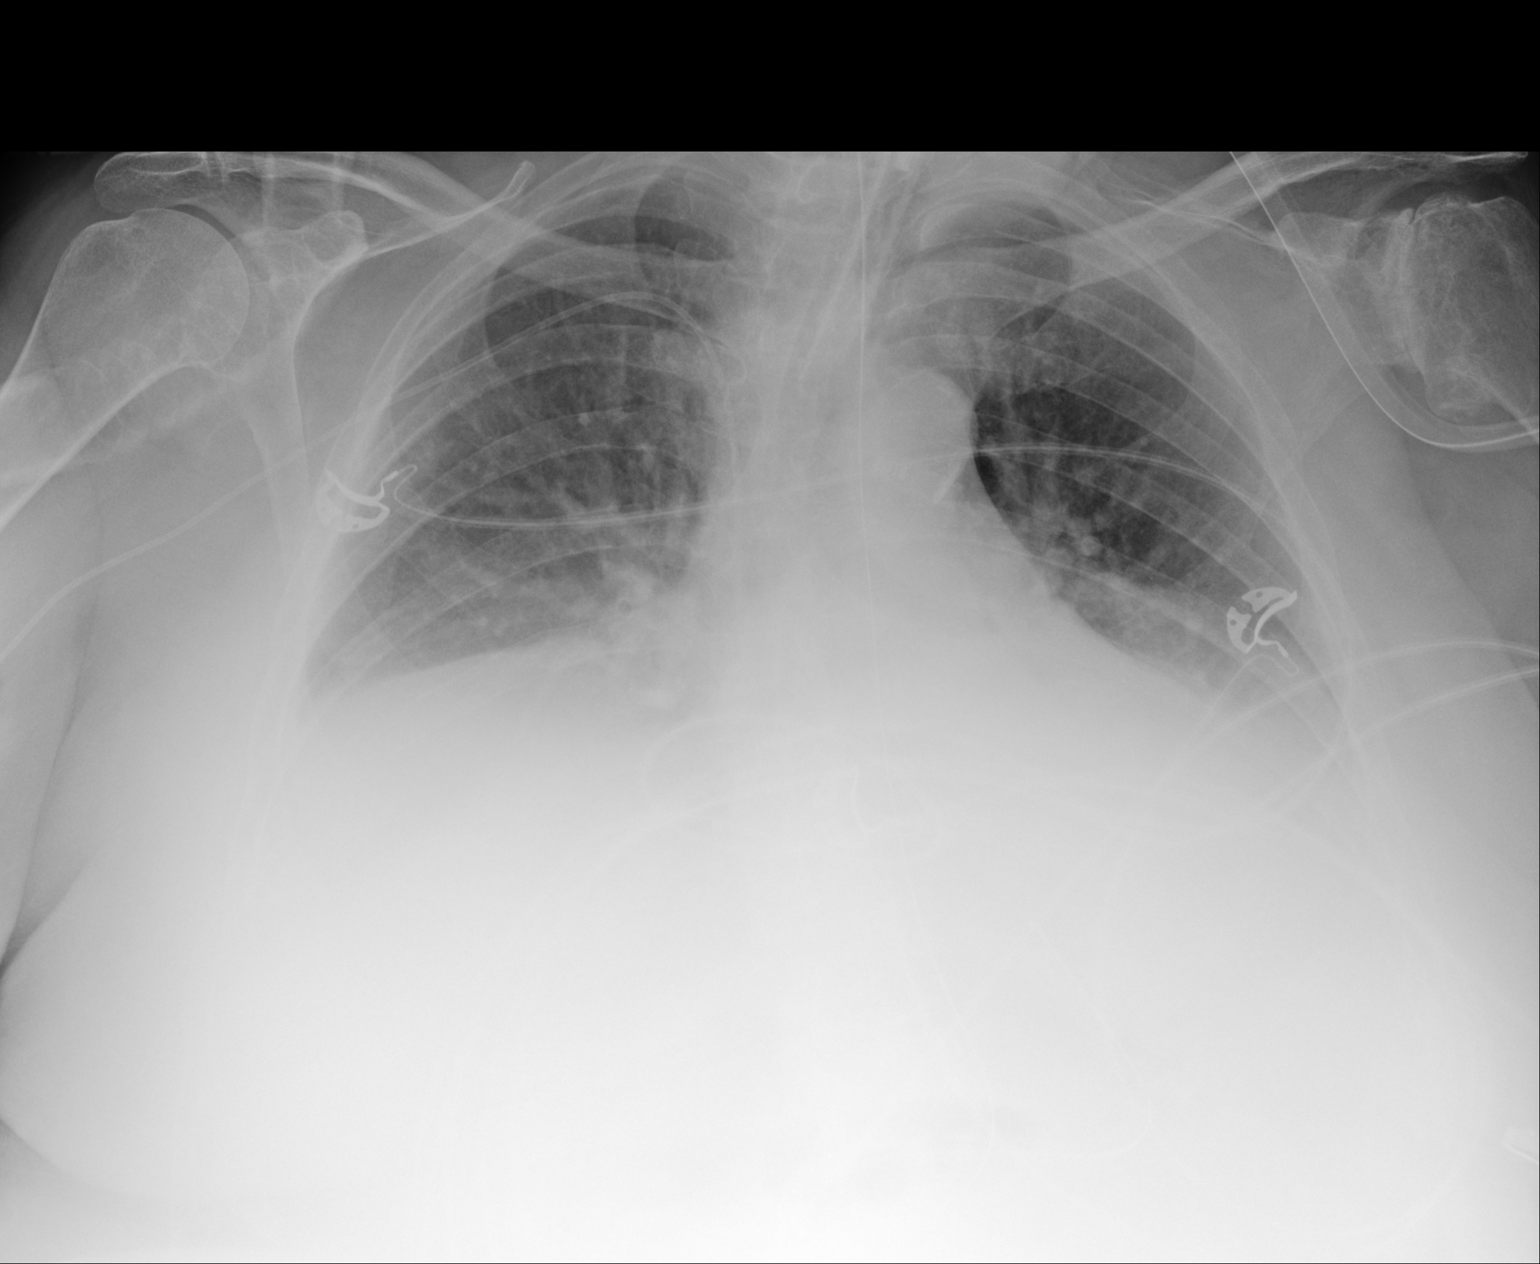

[1 of 1 positions shown; findings below may reference images not displayed]

FINDINGS: Stable cardiomediastinal silhouette. Atherosclerosis of thoracic
aorta is noted. Endotracheal and nasogastric tubes are unchanged in
position. Right-sided PICC line is unchanged in position. Stable
hypoinflation of the lungs is noted with associated bibasilar
atelectasis and small pleural effusions. No pneumothorax is noted.
Severe degenerative changes seen involving the left glenohumeral
joint.
IMPRESSION: Stable support apparatus. Stable hypoinflation of the lungs with
mild bibasilar subsegmental atelectasis and small pleural effusions.

Aortic Atherosclerosis ([id number]-PHM.M).

## 2021-03-21 ENCOUNTER — Other Ambulatory Visit: Payer: Self-pay

## 2021-03-21 ENCOUNTER — Telehealth (INDEPENDENT_AMBULATORY_CARE_PROVIDER_SITE_OTHER): Payer: Medicare Other | Admitting: Family Medicine

## 2021-03-21 ENCOUNTER — Encounter: Payer: Self-pay | Admitting: Family Medicine

## 2021-03-21 DIAGNOSIS — G4701 Insomnia due to medical condition: Secondary | ICD-10-CM

## 2021-03-21 DIAGNOSIS — Z933 Colostomy status: Secondary | ICD-10-CM

## 2021-03-21 DIAGNOSIS — I10 Essential (primary) hypertension: Secondary | ICD-10-CM

## 2021-03-21 DIAGNOSIS — C801 Malignant (primary) neoplasm, unspecified: Secondary | ICD-10-CM

## 2021-03-21 DIAGNOSIS — Z515 Encounter for palliative care: Secondary | ICD-10-CM

## 2021-03-21 DIAGNOSIS — J302 Other seasonal allergic rhinitis: Secondary | ICD-10-CM

## 2021-03-21 DIAGNOSIS — G894 Chronic pain syndrome: Secondary | ICD-10-CM | POA: Diagnosis not present

## 2021-03-21 DIAGNOSIS — F419 Anxiety disorder, unspecified: Secondary | ICD-10-CM

## 2021-03-21 MED ORDER — MS CONTIN 30 MG PO TBCR: 30.0000 mg | EXTENDED_RELEASE_TABLET | Freq: Two times a day (BID) | ORAL | 0 refills | Status: DC

## 2021-03-21 MED ORDER — MONTELUKAST SODIUM 10 MG PO TABS: 10.0000 mg | ORAL_TABLET | Freq: Every day | ORAL | 1 refills | Status: DC

## 2021-03-21 MED ORDER — LORAZEPAM 0.5 MG PO TABS: 0.5000 mg | ORAL_TABLET | ORAL | 0 refills | Status: DC | PRN

## 2021-03-21 MED ORDER — MORPHINE SULFATE (CONCENTRATE) 10 MG /0.5 ML PO SOLN: 20.0000 mg | ORAL | 0 refills | Status: DC | PRN

## 2021-03-21 MED ORDER — SEROQUEL 25 MG PO TABS: 25.0000 mg | ORAL_TABLET | Freq: Every day | ORAL | 2 refills | Status: DC

## 2021-03-21 NOTE — Patient Instructions (Addendum)
Thank you for coming to the office today. ? ? ? ?Please schedule a Follow-up Appointment to: Return in about 4 weeks (around 04/18/2021) for 4 weeks Telephone apt (bedbound) palliative, medications. ? ?If you have any other questions or concerns, please feel free to call the office or send a message through [facility]. You may also schedule an earlier appointment if necessary. ? ?Additionally, you may be receiving a survey about your experience at our office within a few days to 1 week by e-mail or mail. We value your feedback. ? ?Nobie Putnam, DO ?[facility] ?

## 2021-03-21 NOTE — Progress Notes (Signed)
Virtual Visit via Telephone The purpose of this virtual visit is to provide medical care while limiting exposure to the novel coronavirus (COVID19) for both patient and office staff.  Consent was obtained for phone visit:  Yes.   Answered questions that patient had about telehealth interaction:  Yes.   I discussed the limitations, risks, security and privacy concerns of performing an evaluation and management service by telephone. I Regina discussed with the patient that there may be a patient responsible charge related to this service. The patient expressed understanding and agreed to proceed.  Patient Location: Home Provider Location: [facility] (Office)  Participants in virtual visit: - Patient: Regina Hall , Regina Hall - CMA: Orinda Kenner, CMA - Provider: Dr Parks Ranger  ---------------------------------------------------------------------- Chief Complaint  Patient presents with   Colon Cancer    S: Reviewed CMA documentation. I have called patient and gathered additional HPI as follows:  Colon  [place] Chronic Pain Syndrome Hospice Care Patient  Patient's Regina provides majority of the history today. Report that patient has graduated out of hospice care, was on [facility] service since 06/2019 for colon cancer and chronic pain. She has been managed on current regimen with morphine medication and lorazepam. She has limited function and mobility. Family is providing care currently and will need additional help at home for ADLs and function and medication admin.  Denies any known or suspected exposure to person with or possibly with COVID19.  Denies any fevers, chills, sweats, body ache, cough, shortness of breath, sinus pain or pressure, headache, abdominal pain, diarrhea  Past Medical History:  Diagnosis Date   Anemia    Anxiety    pt denies   Arthritis    KNEES AND BACK   Bladder incontinence    Cancer ([place])    basal cell cancer on  nose   Edema of both feet    Hypertension    Hypertensive retinopathy    OU   Macular degeneration    Wet OD, Dry OS   Pneumonia    PONV (postoperative nausea and vomiting)    PONV X 1 EPISODE, COMES OUT OF ANESTHESIA FAST, SOME AWARENESS DURING END OF COLONSCOPY   Scoliosis    VRE (vancomycin resistant enterococcus) culture positive    Social History   Tobacco Use   Smoking status: Never   Smokeless tobacco: Never  Vaping Use   Vaping Use: Never used  Substance Use Topics   Alcohol use: Yes    Comment: OCC WINE   Drug use: No    Current Outpatient Medications:    Cholecalciferol (VITAMIN D3) 5000 units TABS, Take 5,000 Units by mouth daily., Disp: , Rfl:    Cranberry 450 MG CAPS, Take 1 capsule by mouth daily. , Disp: , Rfl:    LORazepam (ATIVAN) 0.5 MG tablet, Take 1 tablet (0.5 mg total) by mouth every 4 (four) hours as needed for anxiety., Disp: 180 tablet, Rfl: 0   montelukast (SINGULAIR) 10 MG tablet, Take 1 tablet (10 mg total) by mouth at bedtime., Disp: 90 tablet, Rfl: 1   Morphine Sulfate (MORPHINE CONCENTRATE) 10 mg / 0.5 ml concentrated solution, Take 1 mL (20 mg total) by mouth every 2 (two) hours as needed., Disp: 60 mL, Rfl: 0   MS CONTIN 30 MG 12 hr tablet, Take 1 tablet (30 mg total) by mouth 2 (two) times daily., Disp: 60 tablet, Rfl: 0   Nutritional Supplements (ENSURE ENLIVE PO), Take 1 Bottle by  mouth 3 (three) times daily between meals. Not every time --occ. (Patient not taking: Reported on 07/08/2019), Disp: , Rfl:    OVER THE COUNTER MEDICATION, Take by mouth as needed (CBD gummies for pain relief). The patient does not take pain medications but the CBD gummies are used and help her with some pain relief (Patient not taking: Reported on 07/08/2019), Disp: , Rfl:    OVER THE COUNTER MEDICATION, Apply topically daily. Skin Protective paste used daily for healing bilateral buttocks ulcerations, Disp: , Rfl:    pilocarpine (PILOCAR) 1 % ophthalmic solution, Place  1 drop into both eyes 2 (two) times daily. , Disp: , Rfl:    polyethylene glycol powder (GLYCOLAX/MIRALAX) 17 GM/SCOOP powder, Take 17 g by mouth daily as needed., Disp: 250 g, Rfl: 1   SEROQUEL 25 MG tablet, Take 1 tablet (25 mg total) by mouth at bedtime., Disp: 30 tablet, Rfl: 2   triamcinolone cream (KENALOG) 0.1 %, APP AA BID PRN, Disp: , Rfl:    vitamin B-12 (CYANOCOBALAMIN) 500 MCG tablet, Take 500 mcg by mouth daily., Disp: , Rfl:   Depression screen [facility] 2/9 05/19/2019 05/13/2019 02/20/2018  Decreased Interest 0 0 0  Down, Depressed, Hopeless 0 0 1  PHQ - 2 Score 0 0 1    No flowsheet data found.  -------------------------------------------------------------------------- O: No physical exam performed due to remote telephone encounter.  Lab results reviewed.  No results found for this or any previous visit (from the past 2160 hour(s)).  -------------------------------------------------------------------------- A&P:   Problem List Items Addressed This Visit     Serous adenocarcinoma (HCC)   Relevant Medications   Morphine Sulfate (MORPHINE CONCENTRATE) 10 mg / 0.5 ml concentrated solution   MS CONTIN 30 MG 12 hr tablet   LORazepam (ATIVAN) 0.5 MG tablet   Other Relevant Orders   AMB Referral to Community Care Coordinaton   Amb Referral to Palliative Care   Hospice care patient   Relevant Medications   Morphine Sulfate (MORPHINE CONCENTRATE) 10 mg / 0.5 ml concentrated solution   MS CONTIN 30 MG 12 hr tablet   SEROQUEL 25 MG tablet   LORazepam (ATIVAN) 0.5 MG tablet   Other Relevant Orders   AMB Referral to [place]   Amb Referral to Palliative Care   Essential hypertension   Relevant Orders   AMB Referral to Community Care Coordinaton   Amb Referral to Palliative Care   Colostomy in place [facility])   Relevant Orders   AMB Referral to Community Care Coordinaton   Amb Referral to Palliative Care   Chronic pain syndrome - Primary   Relevant Medications    Morphine Sulfate (MORPHINE CONCENTRATE) 10 mg / 0.5 ml concentrated solution   MS CONTIN 30 MG 12 hr tablet   Other Relevant Orders   AMB Referral to [place]   Amb Referral to Palliative Care   Other Visit Diagnoses     Seasonal allergies       Relevant Medications   montelukast (SINGULAIR) 10 MG tablet   Anxiety       Relevant Medications   LORazepam (ATIVAN) 0.5 MG tablet   Other Relevant Orders   AMB Referral to [place]   Amb Referral to Palliative Care   Insomnia due to medical condition       Relevant Medications   SEROQUEL 25 MG tablet   LORazepam (ATIVAN) 0.5 MG tablet   Other Relevant Orders   AMB Referral to [place]  Amb Referral to Palliative Care      Recent discharge from [facility] hospice service  Referral today, urgent placed to [facility] Palliative for initial consultation and consider vs Hospice admit in future if eligible.  Will agree to continue current extensive medication regimen as previous hospice patient with Morphine management long acting '30mg'$  BID q 12 hr and Morphine sulfate liquid as prescribed by hospice. Will re order current dosages.  Additionally continue BDZ Lorazepam for anxiety, insomnia. 0.'5mg'$  q 4 hr PRN, Seroquel '25mg'$  nightly for anxiety/insomnia.  Mostly symptom management focused on pain at this time  She may warrant home health therapy new orders if cannot get through previous company, however depending on palliative referral may change plan if determined return to hospice  Regina add Chronic Care Management referral for Nurse Case manager, LCSW, Pharmacist. For further assistance in complex patient management, and need for future home assistance w/ home health or home care options.  Orders Placed This Encounter  Procedures   AMB Referral to [facility] Coordinaton    Referral Priority:   Routine    Referral Type:   Consultation    Referral Reason:   Care Coordination     Number of Visits Requested:   1   Amb Referral to Palliative Care    Referral Priority:   Urgent    Referral Type:   Consultation    Number of Visits Requested:   1     Meds ordered this encounter  Medications   montelukast (SINGULAIR) 10 MG tablet    Sig: Take 1 tablet (10 mg total) by mouth at bedtime.    Dispense:  90 tablet    Refill:  1   Morphine Sulfate (MORPHINE CONCENTRATE) 10 mg / 0.5 ml concentrated solution    Sig: Take 1 mL (20 mg total) by mouth every 2 (two) hours as needed.    Dispense:  60 mL    Refill:  0   MS CONTIN 30 MG 12 hr tablet    Sig: Take 1 tablet (30 mg total) by mouth 2 (two) times daily.    Dispense:  60 tablet    Refill:  0   SEROQUEL 25 MG tablet    Sig: Take 1 tablet (25 mg total) by mouth at bedtime.    Dispense:  30 tablet    Refill:  2   LORazepam (ATIVAN) 0.5 MG tablet    Sig: Take 1 tablet (0.5 mg total) by mouth every 4 (four) hours as needed for anxiety.    Dispense:  180 tablet    Refill:  0    Follow-up: PRN  Forwarded chart to our CCM team for review and to [facility].  Patient verbalizes understanding with the above medical recommendations including the limitation of remote medical advice.  Specific follow-up and call-back criteria were given for patient to follow-up or seek medical care more urgently if needed.   - Time spent in direct consultation with patient on phone: 15 minutes   Nobie Putnam, [facility] 03/21/2021, 12:00 PM

## 2021-03-22 ENCOUNTER — Telehealth: Payer: Self-pay

## 2021-03-22 NOTE — Chronic Care Management (AMB) (Signed)
?  Chronic Care Management  ? ?Note ? ?03/22/2021 ?Name: Regina Hall MRN: [id number] DOB: 02-01-1934 ? ?Regina Hall is a 87 y.o. year old female who is a primary care patient of Olin Hauser, DO. I reached out to [name] by phone today in response to a referral sent by Regina Hall PCP. ? ?Regina Hall was given information about Chronic Care Management services today including:  ?CCM service includes personalized support from designated clinical staff supervised by her physician, including individualized plan of care and coordination with other care providers ?24/7 contact phone numbers for assistance for urgent and routine care needs. ?Service will only be billed when office clinical staff spend 20 minutes or more in a month to coordinate care. ?Only one practitioner may furnish and bill the service in a calendar month. ?The patient may stop CCM services at any time (effective at the end of the month) by phone call to the office staff. ?The patient is responsible for co-pay (up to 20% after annual deductible is met) if co-pay is required by the individual health plan.  ? ?Patient agreed to services and verbal consent obtained.  ? ?Follow up plan: ?Telephone appointment with care management team member scheduled for: ?LCSW 03/28/2021 ?RN CM 04/06/2021 ?Pharm D 04/12/2021 ? ?Noreene Larsson, RMA ?Care Guide, Embedded Care Coordination ?[facility]  ?[place], [place] [address] ?Direct Dial: [phone] ?[email or web address] ?Website: [email or web address]  ? ?

## 2021-03-24 ENCOUNTER — Telehealth: Payer: Self-pay | Admitting: Primary Care

## 2021-03-24 NOTE — Telephone Encounter (Signed)
Spoke with patient's son Christia Reading, regarding the Palliative referral/services and after all questions were answered, he was in agreement with beginning services with [place].  I have scheduled an In-home Consult for 03/28/21 @ 9:30 AM ?

## 2021-03-27 ENCOUNTER — Other Ambulatory Visit: Payer: Medicare Other | Admitting: Primary Care

## 2021-03-27 ENCOUNTER — Other Ambulatory Visit: Payer: Self-pay

## 2021-03-27 DIAGNOSIS — G8929 Other chronic pain: Secondary | ICD-10-CM

## 2021-03-27 DIAGNOSIS — M51379 Other intervertebral disc degeneration, lumbosacral region without mention of lumbar back pain or lower extremity pain: Secondary | ICD-10-CM

## 2021-03-27 DIAGNOSIS — M5137 Other intervertebral disc degeneration, lumbosacral region: Secondary | ICD-10-CM

## 2021-03-27 DIAGNOSIS — G894 Chronic pain syndrome: Secondary | ICD-10-CM

## 2021-03-27 DIAGNOSIS — Z515 Encounter for palliative care: Secondary | ICD-10-CM

## 2021-03-27 DIAGNOSIS — M545 Low back pain, unspecified: Secondary | ICD-10-CM

## 2021-03-27 NOTE — Progress Notes (Signed)
? ? ?Manufacturing engineer ?Community Palliative Care Consult Note ?Telephone: [phone]  ?Fax: [phone]  ? ? ?Date of encounter: 03/27/21 ?3:09 PM ?PATIENT NAME: LUDELLA PRANGER ?[address] ?[place] [address]   ?[phone] (home)  ?DOB: 08-10-1934 ?MRN: [id number] ?PRIMARY CARE PROVIDER:    ?Olin Hauser, DO,  ?[address] ?[place] [address] ?[phone] ? ?REFERRING PROVIDER:   ?Olin Hauser, DO ?[address] ?[place],  [place] [address] ?[phone] ? ?RESPONSIBLE PARTY:    ?Contact Information   ? ? Name Relation Home Work Mobile  ? Shaver,Timothy Son   [phone]  ? Abran Richard Sister   [phone]  ? ?  ? ? ? ?I met face to face with patient and family in home. Palliative Care was asked to follow this patient by consultation request of  Nobie Putnam * to address advance care planning and complex medical decision making. This is a follow up visit. ? ?                                 ASSESSMENT AND PLAN / RECOMMENDATIONS:  ? ?Advance Care Planning/Goals of Care: Goals include to maximize quality of life and symptom management. Patient/health care surrogate gave his/her permission to discuss.Our advance care planning conversation included a discussion about:    ?The value and importance of advance care planning  ?Exploration of personal, cultural or spiritual beliefs that might influence medical decisions  ?Exploration of goals of care in the event of a sudden injury or illness  ?Identification of a healthcare agent Son Octavia Bruckner ?Review of an  advance directive document Has DNR but no MOST. This was given today and discussed. ?Decision not to resuscitate or to de-escalate disease focused treatments due to poor prognosis. ?CODE STATUS: DNR ? ?Symptom Management/Plan: ? ?Pain:  On morphine 30 mg q 12 hrs, with btp  20 mg 1-2 x daily prn. Recommended acetaminophen  CR 650 mg q 8 hrs. Will try. ? ?Mobility : Bedbound, has  electric lift but son does not use to get OOB  alone ? ?Caregiver deficits; Profound since d/c from hospice. I will ask SW Henrene Pastor to call Octavia Bruckner and discuss any in home services. [place] is seen to be in [facility] for services.  ? ?Skin: Intertriginous rash L side, recommended OTC anti fungal.Son has been using steroid, Hold steroid for short term.  Recommend Florastor which they have. Recommended to keep skin  less warm and moist as this was encouraging to yeast. ? ?Follow up Palliative Care Visit: Palliative care will continue to follow for complex medical decision making, advance care planning, and clarification of goals. Return 4-6 weeks or prn. ? ?I spent 60 minutes providing this consultation. More than 50% of the time in this consultation was spent in counseling and care coordination. ? ?PPS: 30% ? ?HOSPICE ELIGIBILITY/DIAGNOSIS: TBD ? ?Chief Complaint: debility ? ?HISTORY OF PRESENT ILLNESS:  NOLIE BIGNELL is a 87 y.o. year old female  with cognitive impairment, debility, immobility, chronic pain, presents after hospice d/c for palliative medicine resumption.  Patient seen today to review palliative care needs to include medical decision making and advance care planning as appropriate.  ? ?History obtained from review of EMR, discussion with primary team, and interview with family, facility staff/caregiver and/or Ms. Yzaguirre.  ?I reviewed available labs, medications, imaging, studies and  related documents from the EMR.  Records reviewed and summarized above.  ? ?ROS ? ? ?General: NAD ?EYES: denies vision changes ?ENMT: denies dysphagia ?Cardiovascular: denies chest pain, denies DOE ?Pulmonary: denies cough, denies increased SOB ?Abdomen: endorses good appetite, denies constipation, ostomy for stool ?GU: denies dysuria, catheter for urine.  ?MSK:  denies  increased weakness,  no falls reported, 30 degree HOB tolerated only ?Skin: denies rashes or wounds ?Neurological: endorses chronic  pain,  denies insomnia ?Psych: Endorses flat mood ? ?Physical Exam: ?Current and past weights:unavailable ?Constitutional: NAD ?General: frail appearing, obese  ?EYES: anicteric sclera, lids intact, no discharge  ?ENMT: intact hearing, oral mucous membranes moist ?CV: S1S2, RRR, 1+ bil  LE edema ?Pulmonary: LCTA, no increased work of breathing, no cough, room air ?Abdomen: intake 50-75%, normo-active BS + 4 quadrants, soft and non tender, no ascites, ostomy bag in place ?MSK: no sarcopenia, moves UE  extremities, functional paraplegia, non ambulatory ?Skin: warm and dry, + rash L breast, +wound on abdomen, healed but son reports periodic fungating tumor. ?Neuro: + generalized weakness,  + cognitive impairment, non-anxious affect ? ? ?Thank you for the opportunity to participate in the care of Ms. Jansson.  The palliative care team will continue to follow. Please call our office at [phone] if we can be of additional assistance.  ? ?Jason Coop, NP DNP, AGPCNP-BC ? ?COVID-19 PATIENT SCREENING TOOL ?Asked and negative response unless otherwise noted:  ? ?Have you had symptoms of covid, tested positive or been in contact with someone with symptoms/positive test in the past 5-10 days?  ? ?

## 2021-03-28 ENCOUNTER — Ambulatory Visit (INDEPENDENT_AMBULATORY_CARE_PROVIDER_SITE_OTHER): Payer: Medicare Other | Admitting: Licensed Clinical Social Worker

## 2021-03-28 ENCOUNTER — Other Ambulatory Visit: Payer: Medicare Other | Admitting: Primary Care

## 2021-03-28 DIAGNOSIS — M1711 Unilateral primary osteoarthritis, right knee: Secondary | ICD-10-CM

## 2021-03-28 DIAGNOSIS — I10 Essential (primary) hypertension: Secondary | ICD-10-CM

## 2021-03-28 DIAGNOSIS — G894 Chronic pain syndrome: Secondary | ICD-10-CM

## 2021-03-28 DIAGNOSIS — I4891 Unspecified atrial fibrillation: Secondary | ICD-10-CM

## 2021-03-28 NOTE — Patient Instructions (Signed)
Visit Information ? ?Thank you for taking time to visit with me today. Please don't hesitate to contact me if I can be of assistance to you before our next scheduled telephone appointment. ? ?Following are the goals we discussed today:  ?Patient Goals/Self-Care Activities: Over the next 120 days ?Attend scheduled medical appointments ?Utilize healthy coping skills and/or supportive resources discussed ?Contact PCP office with any questions or concerns ? ?Our next appointment is by telephone on 05/23/21 at 11:30 AM ? ?Please call the care guide team at [phone] if you need to cancel or reschedule your appointment.  ? ?If you are experiencing a Mental Health or [place] or need someone to talk to, please call 911  ? ?Following is a copy of your full plan of care:  ?Care Plan : General Social Work (Adult)  ?Updates made by Rebekah Chesterfield, LCSW since 03/28/2021 12:00 AM  ?  ? ?Problem: Quality of Life (General Plan of Care)   ?  ? ?Long-Range Goal: Quality of Life Maintained   ?Start Date: 03/28/2021  ?This Visit's Progress: On track  ?Priority: High  ?Note:   ?Current barriers:   ? Level of care concerns, Inability to perform ADL's independently, and Inability to perform IADL's independently ?Clinical Goals: Patient will work with CCM LCSW to address needs related to chronic health conditions ?Clinical Interventions:  ?Assessment of needs, barriers , agencies contacted, as well as how impacting barriers to care ?All hx provided by pt's son, Christia Reading, patient's sole caregiver. He reports pt has limited mobility and he has difficulty bathing her. LCSW will collaborate with PCP regarding request for a bath aid ?Denies caregiver stress and/or psychosocial stressors regarding housing, utility, or food insecurity ?Patient is compliant with medication management to assist with depression and anxiety symptoms, including, confusion and crying episodes ?Solution-Focused Strategies employed:  ?Active listening /  Reflection utilized  ?Emotional Support Provided ?Caregiver stress acknowledged  ?Consideration of in-home help encouraged : options discussed ?Verbalization of feelings encouraged  ?Review various resources, discussed options and provided patient information about  ?Private pay options for personal care needs :(Patient is not eligible for MA) ?1:1 collaboration with primary care provider regarding development and update of comprehensive plan of care as evidenced by provider attestation and co-signature ?Inter-disciplinary care team collaboration (see longitudinal plan of care) ?Patient Goals/Self-Care Activities: Over the next 120 days ?Attend scheduled medical appointments ?Utilize healthy coping skills and/or supportive resources discussed ?Contact PCP office with any questions or concerns ?  ? ? ?Ms. Hoelzel was given information about Care Management services by the embedded care coordination team including:  ?Care Management services include personalized support from designated clinical staff supervised by her physician, including individualized plan of care and coordination with other care providers ?24/7 contact phone numbers for assistance for urgent and routine care needs. ?The patient may stop CCM services at any time (effective at the end of the month) by phone call to the office staff. ? ?Patient agreed to services and verbal consent obtained.  ? ?The patient verbalized understanding of instructions, educational materials, and care plan provided today and declined offer to receive copy of patient instructions, educational materials, and care plan.  ? ?Christa See, MSW, LCSW ?[facility] Care Management ?[facility] ?[email or web address] ?Phone [phone] ?9:37 AM ? ? ?  ?

## 2021-03-28 NOTE — Chronic Care Management (AMB) (Signed)
?Chronic Care Management  ? ? Clinical Social Work Note ? ?03/28/2021 ?Name: Regina Hall MRN: [id number] DOB: 09-04-1934 ? ?Regina Hall is a 87 y.o. year old female who is a primary care patient of Olin Hauser, DO. The CCM team was consulted to assist the patient with chronic disease management and/or care coordination needs related to: Level of Care Concerns.  ? ?Engaged with patient's son by telephone for initial visit in response to provider referral for social work chronic care management and care coordination services.  ? ?Consent to Services:  ?The patient was given the following information about Chronic Care Management services today, agreed to services, and gave verbal consent: 1. CCM service includes personalized support from designated clinical staff supervised by the primary care provider, including individualized plan of care and coordination with other care providers 2. 24/7 contact phone numbers for assistance for urgent and routine care needs. 3. Service will only be billed when office clinical staff spend 20 minutes or more in a month to coordinate care. 4. Only one practitioner may furnish and bill the service in a calendar month. 5.The patient may stop CCM services at any time (effective at the end of the month) by phone call to the office staff. 6. The patient will be responsible for cost sharing (co-pay) of up to 20% of the service fee (after annual deductible is met). Patient agreed to services and consent obtained. ? ?Patient agreed to services and consent obtained.  ? ?Assessment: Review of patient past medical history, allergies, medications, and health status, including review of relevant consultants reports was performed today as part of a comprehensive evaluation and provision of chronic care management and care coordination services.    ? ?SDOH (Social Determinants of Health) assessments and interventions performed:   ? ?Advanced Directives Status: Not addressed in this  encounter. ? ?CCM Care Plan ? ?Allergies  ?Allergen Reactions  ? Hydrocodone-Acetaminophen Anaphylaxis  ? Penicillins Rash, Other (See Comments) and Hives  ?  Has patient had a PCN reaction causing immediate rash, facial/tongue/throat swelling, SOB or lightheadedness with hypotension: ###Yes## ?Has patient had a PCN reaction causing severe rash involving mucus membranes or skin necrosis: No ?Has patient had a PCN reaction that required hospitalization No ?Has patient had a PCN reaction occurring within the last 10 years: No ?If all of the above answers are "NO", then may proceed with Cephalosporin use. ?  ? Vancomycin Shortness Of Breath and Itching  ? ? ?Outpatient Encounter Medications as of 03/28/2021  ?Medication Sig  ? Cholecalciferol (VITAMIN D3) 5000 units TABS Take 5,000 Units by mouth daily.  ? Cranberry 450 MG CAPS Take 1 capsule by mouth daily.   ? LORazepam (ATIVAN) 0.5 MG tablet Take 1 tablet (0.5 mg total) by mouth every 4 (four) hours as needed for anxiety.  ? montelukast (SINGULAIR) 10 MG tablet Take 1 tablet (10 mg total) by mouth at bedtime.  ? Morphine Sulfate (MORPHINE CONCENTRATE) 10 mg / 0.5 ml concentrated solution Take 1 mL (20 mg total) by mouth every 2 (two) hours as needed.  ? MS CONTIN 30 MG 12 hr tablet Take 1 tablet (30 mg total) by mouth 2 (two) times daily.  ? Nutritional Supplements (ENSURE ENLIVE PO) Take 1 Bottle by mouth 3 (three) times daily between meals. Not every time --occ. (Patient not taking: Reported on 07/08/2019)  ? OVER THE COUNTER MEDICATION Take by mouth as needed (CBD gummies for pain relief). The patient does not take pain medications but  the CBD gummies are used and help her with some pain relief (Patient not taking: Reported on 07/08/2019)  ? OVER THE COUNTER MEDICATION Apply topically daily. Skin Protective paste used daily for healing bilateral buttocks ulcerations  ? pilocarpine (PILOCAR) 1 % ophthalmic solution Place 1 drop into both eyes 2 (two) times daily.   (Patient not taking: Reported on 03/27/2021)  ? polyethylene glycol powder (GLYCOLAX/MIRALAX) 17 GM/SCOOP powder Take 17 g by mouth daily as needed.  ? sennosides-docusate sodium (SENOKOT-S) 8.6-50 MG tablet Take 1 tablet by mouth daily.  ? SEROQUEL 25 MG tablet Take 1 tablet (25 mg total) by mouth at bedtime.  ? triamcinolone cream (KENALOG) 0.1 % APP AA BID PRN  ? vitamin B-12 (CYANOCOBALAMIN) 500 MCG tablet Take 500 mcg by mouth daily.  ? ?No facility-administered encounter medications on file as of 03/28/2021.  ? ? ?Patient Active Problem List  ? Diagnosis Date Noted  ? Hospice care patient 07/02/2019  ? Chronic pain syndrome 05/13/2019  ? Chronic bilateral low back pain without sciatica 05/13/2019  ? Other idiopathic scoliosis, lumbar region 05/13/2019  ? 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 Presence of IVC filter 04/11/2018  ? Chronic kidney disease, stage 3 (moderate) 03/14/2018  ? New onset atrial fibrillation ([place]) 03/14/2018  ? Chronic non-seasonal allergic rhinitis 03/14/2018  ? GERD without esophagitis 03/14/2018  ? Venous stasis of both lower extremities 03/14/2018  ? Colostomy in place [facility]) 03/14/2018  ? Pulmonary edema   ? Bronchospasm   ? Fever 02/26/2018  ? Chronic constipation   ? DDD (degenerative disc disease), lumbosacral 12/09/2017  ? Impaired fasting glucose 12/09/2017  ? Essential hypertension 06/04/2017  ? Chronic conjunctivitis 10/22/2016  ? Chronic back pain 08/21/2016  ? Scoliosis of lumbar spine 03/28/2015  ? Primary osteoarthritis of right knee 04/27/2014  ? Hip pain 02/17/2013  ? History of total hip arthroplasty 09/09/2012  ? ? ?Conditions to be addressed/monitored: Atrial Fibrillation, HTN, CKD Stage 3, Chronic Pain Syndrome, OsteoarthritisLevel of care concerns ? ?Care Plan : General Social Work (Adult)  ?Updates made by Rebekah Chesterfield, LCSW since 03/28/2021 12:00 AM  ?  ? ?Problem: Quality of Life (General Plan of Care)   ?  ? ?Long-Range Goal: Quality of Life Maintained    ?Start Date: 03/28/2021  ?This Visit's Progress: On track  ?Priority: High  ?Note:   ?Current barriers:   ? Level of care concerns, Inability to perform ADL's independently, and Inability to perform IADL's independently ?Clinical Goals: Patient will work with CCM LCSW to address needs related to chronic health conditions ?Clinical Interventions:  ?Assessment of needs, barriers , agencies contacted, as well as how impacting barriers to care ?All hx provided by pt's son, Christia Reading, patient's sole caregiver. He reports pt has limited mobility and he has difficulty bathing her. LCSW will collaborate with PCP regarding request for a bath aid ?Denies caregiver stress and/or psychosocial stressors regarding housing, utility, or food insecurity ?Patient is compliant with medication management to assist with depression and anxiety symptoms, including, confusion and crying episodes ?Solution-Focused Strategies employed:  ?Active listening / Reflection utilized  ?Emotional Support Provided ?Caregiver stress acknowledged  ?Consideration of in-home help encouraged : options discussed ?Verbalization of feelings encouraged  ?Review various resources, discussed options and provided patient information about  ?Private pay options for personal care needs :(Patient is not eligible for MA) ?1:1 collaboration with primary care provider regarding development and update of comprehensive plan of care as evidenced by provider attestation and co-signature ?Inter-disciplinary care  team collaboration (see longitudinal plan of care) ?Patient Goals/Self-Care Activities: Over the next 120 days ?Attend scheduled medical appointments ?Utilize healthy coping skills and/or supportive resources discussed ?Contact PCP office with any questions or concerns ?  ?  ? ?Christa See, MSW, LCSW ?[facility] Care Management ?[place] [facility] ?[email or web address] ?Phone [phone] ?9:36 AM ? ? ? ?

## 2021-03-30 NOTE — Progress Notes (Signed)
PC SW outreached patients son, Christia Reading, per [facility] NP - K. Tamala Julian to assess patient needs.  ? ?Son shared that his biggest barrier currently is bathing patient. Patient as recently discharged from [facility]. Son is primary caregiver for patient and his son.  ? ?Patient has purwick and colostomy bag of which patient son manages on his on. No HH in place, son denies needing HH oversight at this time. ? ?Son has had contact with PCP CCM SW that is assisting with possibly acquirng in home aid services/support. SW has attemepted to connect with CCM SW to collaborate of patient needs if needed. ? ?SW will mail son caregiver support resources as well to look into and possily apply for grants if funds are avaiable. No other needs identified at this time. ?

## 2021-04-06 ENCOUNTER — Ambulatory Visit: Payer: Self-pay

## 2021-04-06 ENCOUNTER — Telehealth: Payer: Medicare Other

## 2021-04-06 DIAGNOSIS — I10 Essential (primary) hypertension: Secondary | ICD-10-CM | POA: Diagnosis not present

## 2021-04-06 DIAGNOSIS — G8929 Other chronic pain: Secondary | ICD-10-CM

## 2021-04-06 DIAGNOSIS — M1711 Unilateral primary osteoarthritis, right knee: Secondary | ICD-10-CM

## 2021-04-06 DIAGNOSIS — I4891 Unspecified atrial fibrillation: Secondary | ICD-10-CM

## 2021-04-06 DIAGNOSIS — Z636 Dependent relative needing care at home: Secondary | ICD-10-CM

## 2021-04-06 DIAGNOSIS — Z741 Need for assistance with personal care: Secondary | ICD-10-CM

## 2021-04-06 DIAGNOSIS — G894 Chronic pain syndrome: Secondary | ICD-10-CM

## 2021-04-06 DIAGNOSIS — F419 Anxiety disorder, unspecified: Secondary | ICD-10-CM

## 2021-04-06 NOTE — Chronic Care Management (AMB) (Signed)
?Chronic Care Management  ? ?CCM RN Visit Note ? ?04/06/2021 ?Name: Regina Hall MRN: [id number] DOB: Sep 18, 1934 ? ?Subjective: ?Regina Hall is a 87 y.o. year old female who is a primary care patient of Olin Hauser, DO. The care management team was consulted for assistance with disease management and care coordination needs.   ? ?Engaged with patient by telephone for follow up visit in response to provider referral for case management and/or care coordination services.  ? ?Consent to Services:  ?The patient was given information about Chronic Care Management services, agreed to services, and gave verbal consent prior to initiation of services.  Please see initial visit note for detailed documentation.  ? ?Patient agreed to services and verbal consent obtained.  ? ?Assessment: Review of patient past medical history, allergies, medications, health status, including review of consultants reports, laboratory and other test data, was performed as part of comprehensive evaluation and provision of chronic care management services.  ? ?SDOH (Social Determinants of Health) assessments and interventions performed:  ?SDOH Interventions   ? ?Flowsheet Row Most Recent Value  ?SDOH Interventions   ?Food Insecurity Interventions Intervention Not Indicated  ?Financial Strain Interventions Intervention Not Indicated  ?Housing Interventions Intervention Not Indicated  ?Intimate Partner Violence Interventions Intervention Not Indicated  ?Physical Activity Interventions Other (Comments)  [The patient is bed bound and cannot do any physical activity]  ?Stress Interventions Intervention Not Indicated  ?Social Connections Interventions Other (Comment)  [is cared for by her son who is primary care giver]  ?Transportation Interventions Intervention Not Indicated  ?Depression Interventions/Treatment  --  [the patient has good support from her son who is her primary care giver]  ? ?  ?  ? ?[place] ? ?Allergies   ?Allergen Reactions  ? Hydrocodone-Acetaminophen Anaphylaxis  ? Penicillins Rash, Other (See Comments) and Hives  ?  Has patient had a PCN reaction causing immediate rash, facial/tongue/throat swelling, SOB or lightheadedness with hypotension: ###Yes## ?Has patient had a PCN reaction causing severe rash involving mucus membranes or skin necrosis: No ?Has patient had a PCN reaction that required hospitalization No ?Has patient had a PCN reaction occurring within the last 10 years: No ?If all of the above answers are "NO", then may proceed with Cephalosporin use. ?  ? Vancomycin Shortness Of Breath and Itching  ? ? ?Outpatient Encounter Medications as of 04/06/2021  ?Medication Sig  ? Cholecalciferol (VITAMIN D3) 5000 units TABS Take 5,000 Units by mouth daily.  ? Cranberry 450 MG CAPS Take 1 capsule by mouth daily.   ? LORazepam (ATIVAN) 0.5 MG tablet Take 1 tablet (0.5 mg total) by mouth every 4 (four) hours as needed for anxiety.  ? montelukast (SINGULAIR) 10 MG tablet Take 1 tablet (10 mg total) by mouth at bedtime.  ? Morphine Sulfate (MORPHINE CONCENTRATE) 10 mg / 0.5 ml concentrated solution Take 1 mL (20 mg total) by mouth every 2 (two) hours as needed.  ? MS CONTIN 30 MG 12 hr tablet Take 1 tablet (30 mg total) by mouth 2 (two) times daily.  ? OVER THE COUNTER MEDICATION Apply topically daily. Skin Protective paste used daily for healing bilateral buttocks ulcerations  ? polyethylene glycol powder (GLYCOLAX/MIRALAX) 17 GM/SCOOP powder Take 17 g by mouth daily as needed.  ? sennosides-docusate sodium (SENOKOT-S) 8.6-50 MG tablet Take 1 tablet by mouth daily.  ? SEROQUEL 25 MG tablet Take 1 tablet (25 mg total) by mouth at bedtime.  ? triamcinolone cream (KENALOG) 0.1 % APP AA  BID PRN  ? vitamin B-12 (CYANOCOBALAMIN) 500 MCG tablet Take 500 mcg by mouth daily.  ? Nutritional Supplements (ENSURE ENLIVE PO) Take 1 Bottle by mouth 3 (three) times daily between meals. Not every time --occ. (Patient not taking:  Reported on 07/08/2019)  ? OVER THE COUNTER MEDICATION Take by mouth as needed (CBD gummies for pain relief). The patient does not take pain medications but the CBD gummies are used and help her with some pain relief (Patient not taking: Reported on 07/08/2019)  ? pilocarpine (PILOCAR) 1 % ophthalmic solution Place 1 drop into both eyes 2 (two) times daily.  (Patient not taking: Reported on 03/27/2021)  ? ?No facility-administered encounter medications on file as of 04/06/2021.  ? ? ?Patient Active Problem List  ? Diagnosis Date Noted  ? Hospice care patient 07/02/2019  ? Chronic pain syndrome 05/13/2019  ? Chronic bilateral low back pain without sciatica 05/13/2019  ? Other idiopathic scoliosis, lumbar region 05/13/2019  ? 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 Presence of IVC filter 04/11/2018  ? Chronic kidney disease, stage 3 (moderate) 03/14/2018  ? New onset atrial fibrillation ([place]) 03/14/2018  ? Chronic non-seasonal allergic rhinitis 03/14/2018  ? GERD without esophagitis 03/14/2018  ? Venous stasis of both lower extremities 03/14/2018  ? 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 Pulmonary edema   ? Bronchospasm   ? Fever 02/26/2018  ? Chronic constipation   ? DDD (degenerative disc disease), lumbosacral 12/09/2017  ? Impaired fasting glucose 12/09/2017  ? Essential hypertension 06/04/2017  ? Chronic conjunctivitis 10/22/2016  ? Chronic back pain 08/21/2016  ? Scoliosis of lumbar spine 03/28/2015  ? Primary osteoarthritis of right knee 04/27/2014  ? Hip pain 02/17/2013  ? History of total hip arthroplasty 09/09/2012  ? ? ?Conditions to be addressed/monitored:Atrial Fibrillation, HTN, and Chronic pain, medications refill needs, caregiver/patient expressed needs  ? ?Care Plan : RNCM: General Plan of Care (Adult) for Chronic Disease Management and Care Coordination Needs  ?Updates made by Vanita Ingles, RN since 04/06/2021 12:00 AM  ?  ? ?Problem: RNCM: Developement of plan of care for chronic disease management  (HTN, Afib, chronic pain, caregiver/patient needs)   ?Priority: High  ?  ? ?Long-Range Goal: RNCM: Effective Management of plan of care for chronic disease management (HTN, Afib, chronic pain, caregiver/patient needs)   ?Start Date: 04/06/2021  ?Expected End Date: 04/07/2022  ?Priority: High  ?Note:   ?Current Barriers:  ?Knowledge Deficits related to plan of care for management of Atrial Fibrillation, HTN, Chronic Pain, and caregiver and patient needs  ?Care Coordination needs related to Limited social support, Level of care concerns, Inability to perform ADL's independently, Inability to perform IADL's independently, and Lacks knowledge of community resource: former Hospice patient discharged from Hospice 3 weeks  ?Chronic Disease Management support and education needs related to Atrial Fibrillation, HTN, Chronic Pain, and care giver support and needs for the patient and the son ?Lacks caregiver support.        ?Cognitive Deficits ? ?RNCM Clinical Goal(s):  ?Patient will verbalize understanding of plan for management of Atrial Fibrillation, HTN, and Chronic pain and caregiver/patient needs as evidenced by keeping appointments, compliant with medications, and working with CCM team and pcp to effectively manage health and well being ?take all medications exactly as prescribed and will call provider for medication related questions as evidenced by taking medications as directed, and working with the pharm D, and pcp to get the needed medications for the patient to maintain health and  well being         ?demonstrate improved and ongoing health management independence as evidenced by stable conditions, stable labs, patient needs being met, and working with the CCM team to effectively manage health and well being        ?demonstrate ongoing self health care management ability for effective management of chronic conditions as evidenced by working with the CCM team through collaboration with Consulting civil engineer, provider, and  care team.  ? ?Interventions: ?1:1 collaboration with primary care provider regarding development and update of comprehensive plan of care as evidenced by provider attestation and co-signature ?Inter-disciplinary car

## 2021-04-06 NOTE — Patient Instructions (Signed)
Visit Information  ? ?Thank you for taking time to visit with me today. Please don't hesitate to contact me if I can be of assistance to you before our next scheduled telephone appointment. ? ?Following are the goals we discussed today:  ?(Copy and paste patient goals from clinical care plan here) ? ?Our next appointment is by telephone on 05-11-2021 at 1145 am ? ?Please call the care guide team at [phone] if you need to cancel or reschedule your appointment.  ? ?If you are experiencing a Mental Health or [place] or need someone to talk to, please call the Suicide and Crisis Lifeline: 988 ?call the [place] National Suicide Prevention Lifeline: 201-629-6265 or TTY: (340) 721-9318 TTY 512-646-3115) to talk to a trained counselor ?call 1-800-273-TALK (toll free, 24 hour hotline)  ? ?Following is a copy of your full care plan:  ?Care Plan : RNCM: General Plan of Care (Adult) for Chronic Disease Management and Care Coordination Needs  ?Updates made by Vanita Ingles, RN since 04/06/2021 12:00 AM  ?  ? ?Problem: RNCM: Developement of plan of care for chronic disease management (HTN, Afib, chronic pain, caregiver/patient needs)   ?Priority: High  ?  ? ?Long-Range Goal: RNCM: Effective Management of plan of care for chronic disease management (HTN, Afib, chronic pain, caregiver/patient needs)   ?Start Date: 04/06/2021  ?Expected End Date: 04/07/2022  ?Priority: High  ?Note:   ?Current Barriers:  ?Knowledge Deficits related to plan of care for management of Atrial Fibrillation, HTN, Chronic Pain, and caregiver and patient needs  ?Care Coordination needs related to Limited social support, Level of care concerns, Inability to perform ADL's independently, Inability to perform IADL's independently, and Lacks knowledge of community resource: former [facility]osp[facility] patient discharged from [facility]ospice 3 weeks  ?Chronic Disease Management support and education needs related to Atrial Fibrillation, HTN, Chronic Pain, and care  giver support and needs for the patient and the son ?Lacks caregiver support.        ?Cognitive Deficits ? ?RNCM Clinical Goal(s):  ?Patient will verbalize understanding of plan for management of Atrial Fibrillation, HTN, and Chronic pain and caregiver/patient needs as evidenced by keeping appointments, compliant with medications, and working with CCM team and pcp to effectively manage health and well being ?take all medications exactly as prescribed and will call provider for medication related questions as evidenced by taking medications as directed, and working with the pharm D, and pcp to get the needed medications for the patient to maintain health and well being         ?demonstrate improved and ongoing health management independence as evidenced by stable conditions, stable labs, patient needs being met, and working with the CCM team to effectively manage health and well being        ?demonstrate ongoing self health care management ability for effective management of chronic conditions as evidenced by working with the CCM team through collaboration with Consulting civil engineer, provider, and care team.  ? ?Interventions: ?1:1 collaboration with primary care provider regarding development and update of comprehensive plan of care as evidenced by provider attestation and co-signature ?Inter-disciplinary care team collaboration (see longitudinal plan of care) ?Evaluation of current treatment plan related to  self management and patient's adherence to plan as established by provider ? ? ?A-fib:  (Status: New goal. Goal on Track (progressing): YES.) Long Term Goal  ?Counseled on increased risk of stroke due to Afib and benefits of anticoagulation for stroke prevention           ?  Reviewed importance of adherence to anticoagulant exactly as prescribed ?Advised patient to discuss changes in AFIB or heart concerns with provider ?Counseled on bleeding risk associated with AFIB and importance of self-monitoring for  signs/symptoms of bleeding ?Counseled on avoidance of NSAIDs due to increased bleeding risk with anticoagulants ?Counseled on importance of regular laboratory monitoring as prescribed ?Counseled on seeking medical attention after a head injury or if there is blood in the urine/stool ?Afib action plan reviewed ?Screening for signs and symptoms of depression related to chronic disease state ?Assessed social determinant of health barriers ? ?Caregiver stress/patient needs ADLs/IADLs  (Status: New goal. Goal on Track (progressing): YES.) Long Term Goal  ?Evaluation of current treatment plan related to  Caregiver stress, patient needs for ADLs/IADLs , Limited social support, Level of care concerns, Inability to perform ADL's independently, Inability to perform IADL's independently, and Lacks knowledge of community resource: help in the home  self-management and patient's adherence to plan as established by provider. ?Discussed plans with patient for ongoing care management follow up and provided patient with direct contact information for care management team ?Advised patient to call the office for changes in mood, anxiety, depression, rapid decline in health and well being and to alert the pcp and CCM team to new needs or questions; ?Provided education to patient re: working with the pcp, palliative team and CCM team to meet the patients needs; ?Collaborated with pcp, CCM team and palliative team regarding hospital bed needs, medications needs, in home care needs, and ongoing support for the patients son as he is the primary care giver of his elderly mother. The patient was previously with Hospice but was discharged from Hospice services. They told him they would come Tuesday to pick up the hospital bed. He states her could really use the hospital bed for her as she is declining in her health and this helps him with her mobility. He is also concerned about refills for her medications. He currently has a 10 day supply of  her medications but will need refills. He is open to assistance and direction from the pcp and CCM team. Will also reach out to palliative services for guidance and recommendations. ; ?Reviewed scheduled/upcoming provider appointments including had video visit with the pcp on 03-21-2021 and is open for another visit by video if needed for securing needs for the patient; ?Social Work referral for ongoing support and education, and collaboration with the palliative care team for expressed needs.; ?Pharmacy referral for help with refill questions and concerns; ?Discussed plans with patient for ongoing care management follow up and provided patient with direct contact information for care management team; ?Advised patient/son to discuss concern with DME needs and medication needs with provider; ?Screening for signs and symptoms of depression related to chronic disease state;  ?Assessed social determinant of health barriers;  ? ?Hypertension: (Status: New goal. Goal on Track (progressing): YES.) Long Term Goal  ?Last practice recorded BP readings:  ?BP Readings from Last 3 Encounters:  ?05/13/19 (!) 145/76  ?10/23/18 (!) 171/82  ?09/01/18 138/78  ?Most recent eGFR/CrCl: No results found for: EGFR  No components found for: CRCL ? ?Evaluation of current treatment plan related to hypertension self management and patient's adherence to plan as established by provider;   ?Provided education to patient re: stroke prevention, s/s of heart attack and stroke; ?Reviewed prescribed diet heart healthy diet ?Reviewed medications with patient and discussed importance of compliance;  ?Discussed plans with patient for ongoing care management follow up and provided  patient with direct contact information for care management team; ?Discussed complications of poorly controlled blood pressure such as heart disease, stroke, circulatory complications, vision complications, kidney impairment, sexual dysfunction;  ? ?Pain:  (Status: New goal.  Goal on Track (progressing): YES.) Long Term Goal  ?Pain assessment performed. 04-06-2021: The patients son states that the patient can tell him when she has pain. She has pain in her back and abdomen. Unable to assess

## 2021-04-10 ENCOUNTER — Other Ambulatory Visit: Payer: Self-pay | Admitting: Family Medicine

## 2021-04-10 DIAGNOSIS — F419 Anxiety disorder, unspecified: Secondary | ICD-10-CM

## 2021-04-10 DIAGNOSIS — Z515 Encounter for palliative care: Secondary | ICD-10-CM

## 2021-04-10 DIAGNOSIS — G894 Chronic pain syndrome: Secondary | ICD-10-CM

## 2021-04-10 DIAGNOSIS — C801 Malignant (primary) neoplasm, unspecified: Secondary | ICD-10-CM

## 2021-04-10 DIAGNOSIS — G4701 Insomnia due to medical condition: Secondary | ICD-10-CM

## 2021-04-10 MED ORDER — MS CONTIN 30 MG PO TBCR: 30.0000 mg | EXTENDED_RELEASE_TABLET | Freq: Two times a day (BID) | ORAL | 0 refills | Status: DC

## 2021-04-10 MED ORDER — MORPHINE SULFATE (CONCENTRATE) 10 MG /0.5 ML PO SOLN: 20.0000 mg | ORAL | 0 refills | Status: DC | PRN

## 2021-04-10 MED ORDER — LORAZEPAM 0.5 MG PO TABS: 0.5000 mg | ORAL_TABLET | ORAL | 0 refills | Status: DC | PRN

## 2021-04-11 ENCOUNTER — Telehealth: Payer: Self-pay | Admitting: Primary Care

## 2021-04-11 NOTE — Telephone Encounter (Signed)
Called to discuss dme. Message left ?

## 2021-04-12 ENCOUNTER — Telehealth: Payer: Medicare Other

## 2021-04-12 ENCOUNTER — Telehealth: Payer: Self-pay | Admitting: Pharmacist

## 2021-04-12 NOTE — Telephone Encounter (Signed)
?  Chronic Care Management  ? ?Outreach Note ? ?04/12/2021 ?Name: Regina Hall MRN: [id number] DOB: 10-07-1934 ? ?Referred by: Olin Hauser, DO ?Reason for referral : No chief complaint on file. ? ? ?Was unable to reach patient/son via telephone today and have left HIPAA compliant voicemail asking patient to return my call.  ? ? ?Follow Up Plan: Will collaborate with Care Guide to outreach to schedule follow up with me ? ?Wallace Cullens, PharmD, BCACP ?Clinical Pharmacist ?[facility] ?[phone] ? ?

## 2021-04-13 NOTE — Telephone Encounter (Signed)
Pt has been rs and is currently in [facility]  ?

## 2021-04-14 DIAGNOSIS — M1711 Unilateral primary osteoarthritis, right knee: Secondary | ICD-10-CM | POA: Diagnosis not present

## 2021-04-14 DIAGNOSIS — I10 Essential (primary) hypertension: Secondary | ICD-10-CM

## 2021-04-14 DIAGNOSIS — I4891 Unspecified atrial fibrillation: Secondary | ICD-10-CM | POA: Diagnosis not present

## 2021-04-17 NOTE — Telephone Encounter (Signed)
Team, Just FYI - received a message today from [place] NP Estevan Oaks to let us know that following admission to [facility] on 3/28, patient was to be discharged home with [facility] again.  ? ?Spoke with patient's son Darrick Grinder today. Will close patient to CCM services today as son confirms patient being setup with hospice services again, but has contact information for our team to call if needed. ?

## 2021-04-19 ENCOUNTER — Other Ambulatory Visit: Payer: Medicare Other | Admitting: Primary Care

## 2021-05-01 ENCOUNTER — Telehealth: Payer: Medicare Other

## 2021-05-11 ENCOUNTER — Telehealth: Payer: Medicare Other

## 2021-05-23 ENCOUNTER — Telehealth: Payer: Medicare Other

## 2021-08-03 ENCOUNTER — Telehealth: Payer: Self-pay | Admitting: Family Medicine

## 2021-08-03 NOTE — Telephone Encounter (Signed)
Left message for patient to call back and schedule the Medicare Annual Wellness Visit (AWV) virtually or by telephone.  Last AWV 12/19/17  Please schedule at anytime with [place].  Any questions, please call me at [phone]

## 2021-09-14 ENCOUNTER — Ambulatory Visit: Payer: Medicare Other | Admitting: Licensed Clinical Social Worker

## 2021-09-19 NOTE — Patient Outreach (Signed)
  Care Coordination   Initial Visit Note   09/19/2021 Name: Regina Hall MRN: [id number] DOB: 18-Aug-1934  Regina Hall is a 87 y.o. year old female who sees Regina Hauser, DO for primary care. I spoke with  Regina Hall's son, Regina Hall, by phone today.  What matters to the patients health and wellness today?  Pt is participating in services through Hospice. Not in need of care coordination services    Goals Addressed             This Visit's Progress    COMPLETED: Care Coordination Activities-No follow up required       Care Coordination Interventions: Active listening / Reflection utilized  Emotional Support Provided Per pt's son, pt's health has declined Pt is participating in services through Hospice     COMPLETED: Obtain Supportive Resources       Patient Goals/Self-Care Activities: Over the next 120 days Attend scheduled medical appointments Utilize healthy coping skills and/or supportive resources discussed Contact PCP office with any questions or concerns        SDOH assessments and interventions completed:  No     Care Coordination Interventions Activated:  Yes  Care Coordination Interventions:  Yes, provided   Follow up plan: No further intervention required.   Encounter Outcome:  Pt. Visit Completed   Regina Hall, MSW, [email or web address] Phone [phone] 9:56 AM

## 2021-09-19 NOTE — Patient Instructions (Signed)
Visit Information  Thank you for taking time to visit with me today. Please don't hesitate to contact me if I can be of assistance to you.   Following are the goals we discussed today:   Goals Addressed             This Visit's Progress    COMPLETED: Care Coordination Activities-No follow up required       Care Coordination Interventions: Active listening / Reflection utilized  Emotional Support Provided Per pt's son, pt's health has declined Pt is participating in services through Hospice     COMPLETED: [place]       Patient Goals/Self-Care Activities: Over the next 120 days Attend scheduled medical appointments Utilize healthy coping skills and/or supportive resources discussed Contact PCP office with any questions or concerns         If you are experiencing a Mental Health or [place] or need someone to talk to, please call the Suicide and Crisis Lifeline: 988 call 911   Patient verbalizes understanding of instructions and care plan provided today and agrees to view in [place]. Active MyChart status and patient understanding of how to access instructions and care plan via MyChart confirmed with patient.     No further follow up required:    Christa See, MSW, [email or web address] Phone [phone] 9:56 AM

## 2021-11-15 DEATH — deceased

## 2021-12-27 ENCOUNTER — Telehealth: Payer: Self-pay | Admitting: Family Medicine

## 2021-12-27 NOTE — Telephone Encounter (Signed)
N/A unable to leave a message for patient to call back and schedule the Medicare Annual Wellness Visit (AWV) virtually or by telephone.  Last AWV 12/52/19  Please schedule at anytime with [place].   Any questions, please call me at [phone]

## 2022-09-19 IMAGING — US US RENAL
1 series · 14 of 25 positions shown · non-contrast
Comparison: CT abdomen and pelvis 07/08/2018

CLINICAL DATA: Stage III B chronic kidney disease.

EXAM:
RENAL / URINARY TRACT ULTRASOUND COMPLETE

[Series 1: us renal · 14 of 30 slices shown]
[im 1/30]
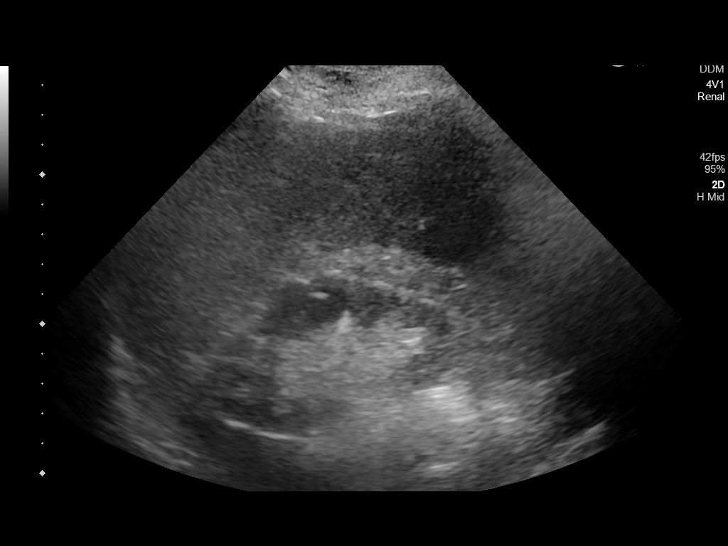
[im 3/30]
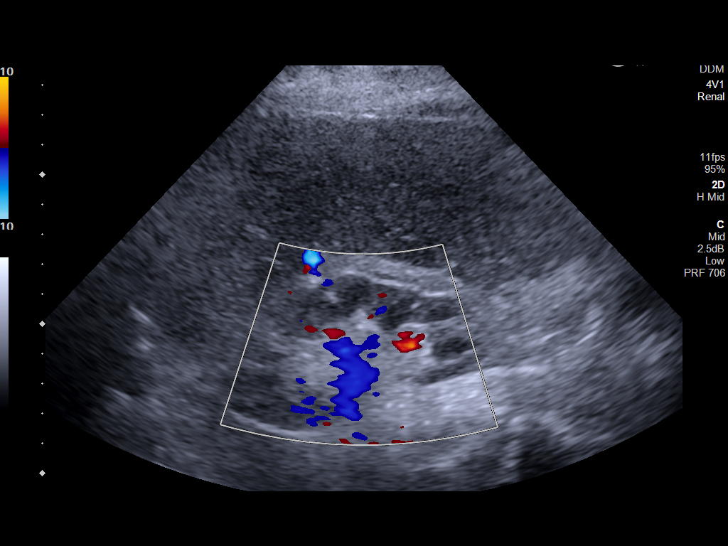
[im 5/30]
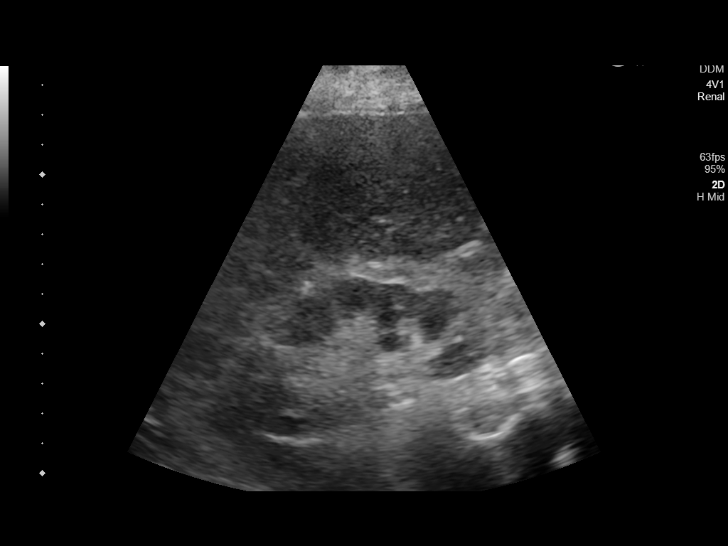
[im 8/30]
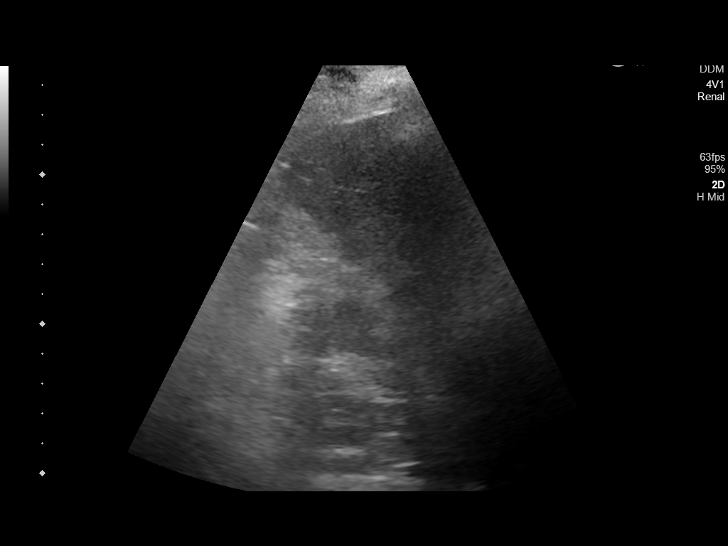
[im 10/30]
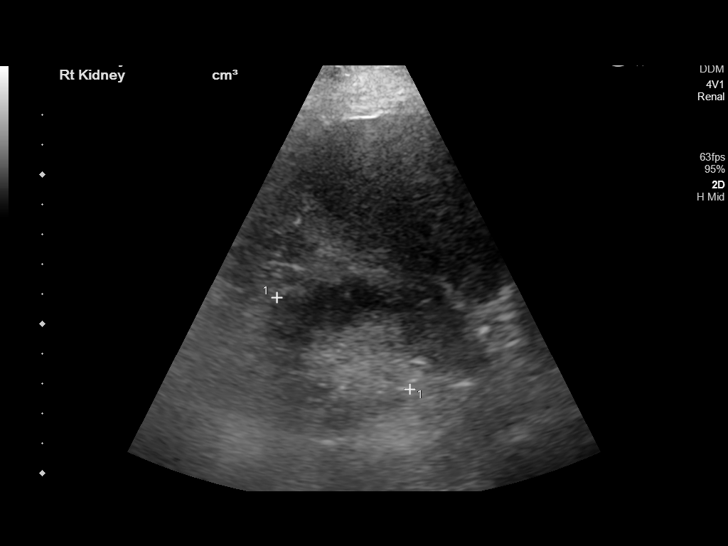
[im 11/30]
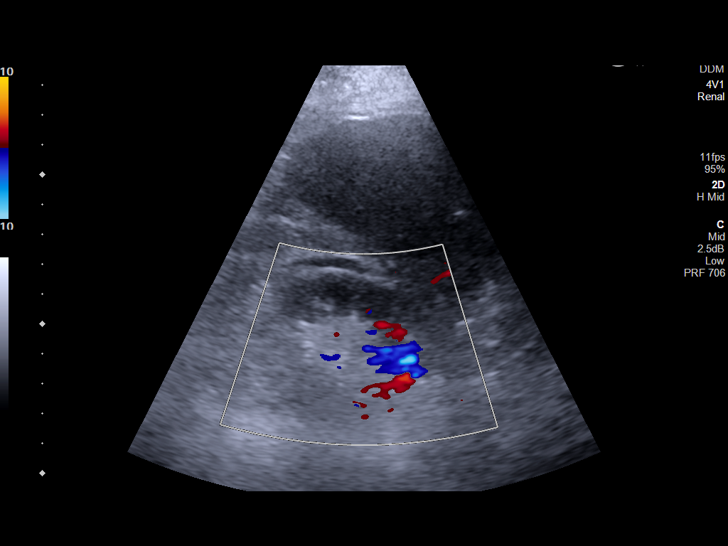
[im 14/30]
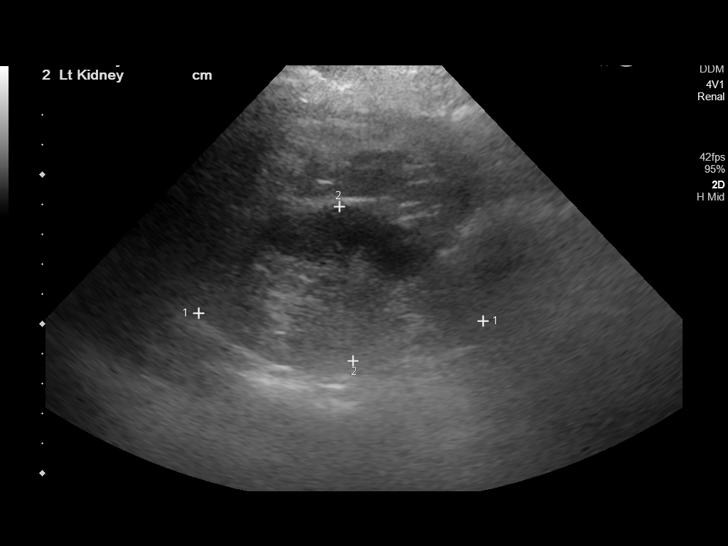
[im 16/30]
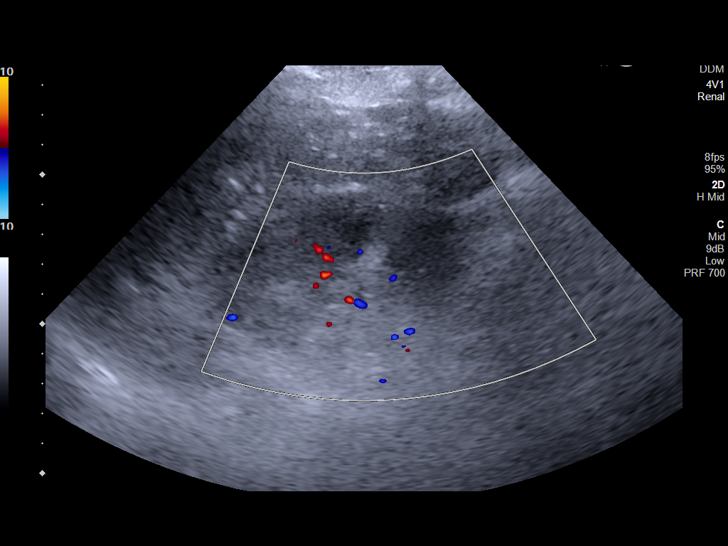
[im 19/30]
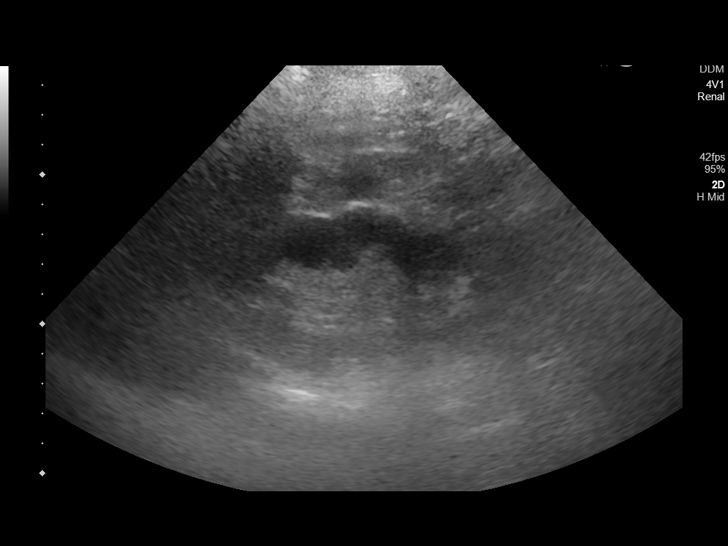
[im 20/30]
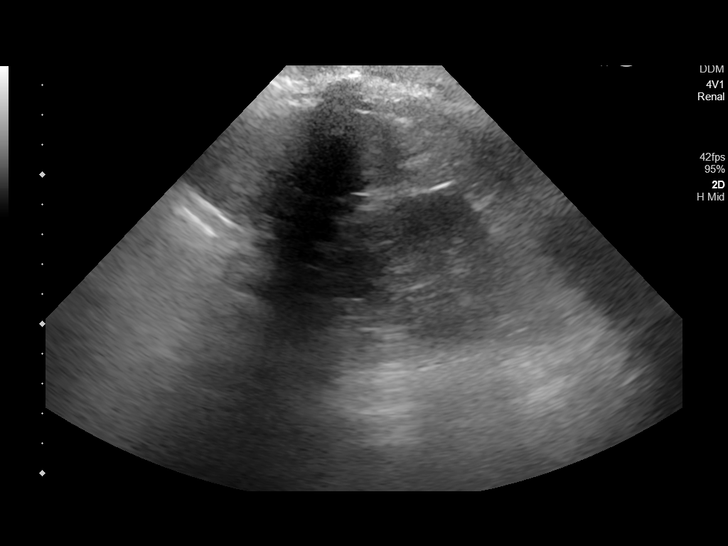
[im 22/30]
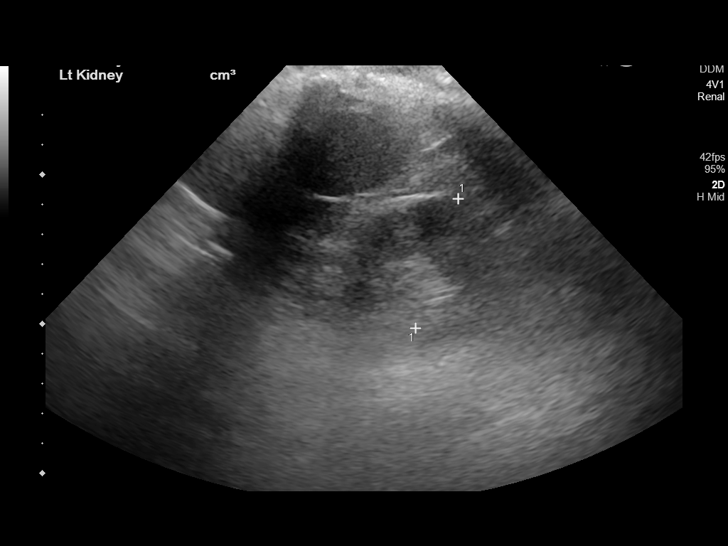
[im 25/30]
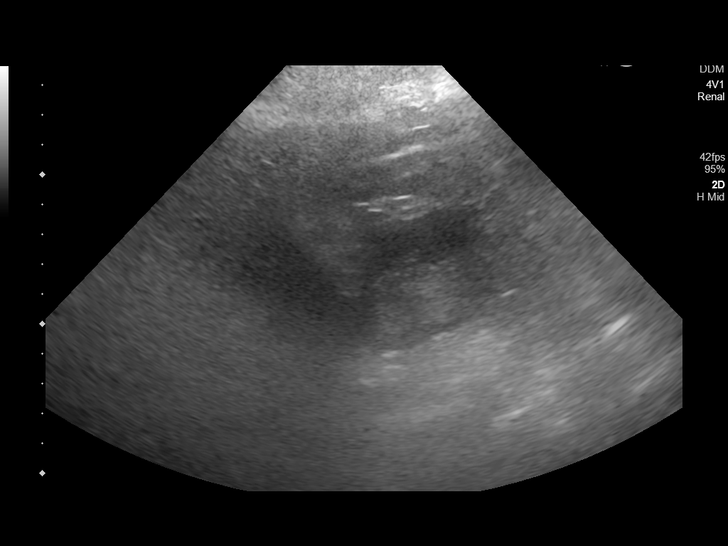
[im 27/30]
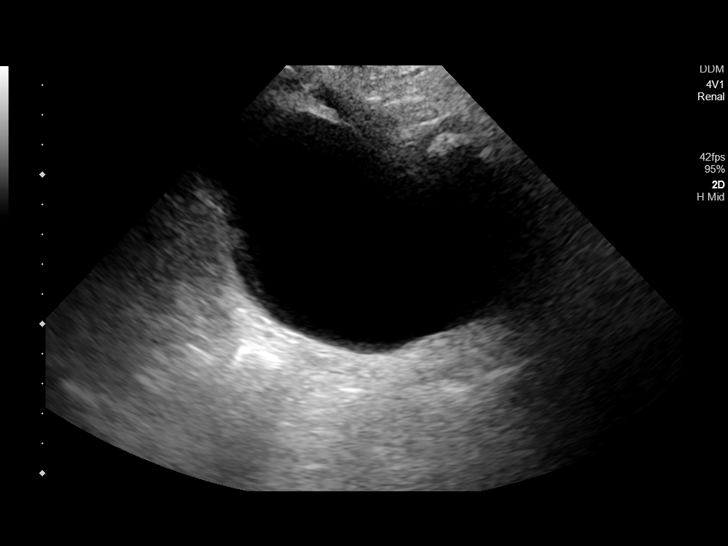
[im 30/30]
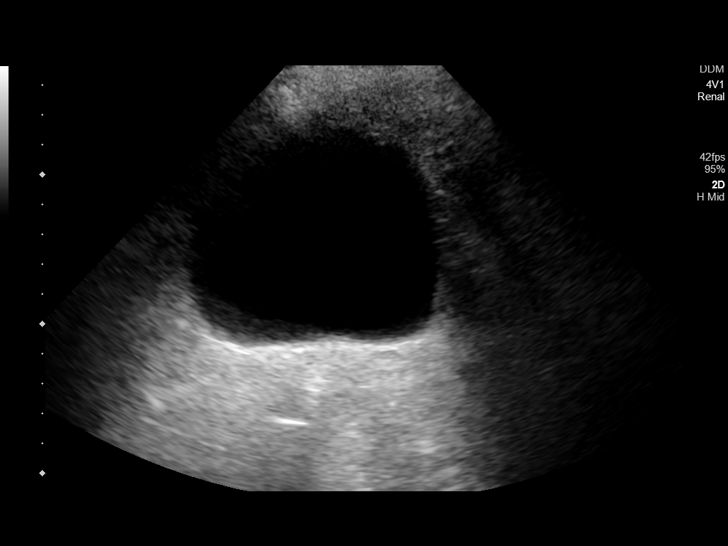

[14 of 25 positions shown; findings below may reference images not displayed]

FINDINGS: Right Kidney:

Renal measurements: 9.6 x 4.7 x 5.4 cm = volume: 130 mL. Increased
parenchymal echogenicity. No mass or hydronephrosis visualized.

Left Kidney:

Renal measurements: 9.5 x 5.2 x 4.6 cm = volume: 118 mL. Increased
parenchymal echogenicity. No mass or hydronephrosis visualized.

Bladder:

Appears normal for degree of bladder distention.

Other:

None.
IMPRESSION: Echogenic kidneys compatible with medical renal disease. No
hydronephrosis or renal mass identified.
